# Patient Record
Sex: Male | Born: 1955 | Race: White | Hispanic: No | Smoking: Former smoker
Health system: Southern US, Community
[De-identification: ages and names within clinical notes are randomized; demographics above are authoritative.]

## PROBLEM LIST (undated history)

## (undated) DIAGNOSIS — K7460 Unspecified cirrhosis of liver: Secondary | ICD-10-CM

## (undated) DIAGNOSIS — K2270 Barrett's esophagus without dysplasia: Secondary | ICD-10-CM

## (undated) DIAGNOSIS — I2510 Atherosclerotic heart disease of native coronary artery without angina pectoris: Secondary | ICD-10-CM

## (undated) DIAGNOSIS — G4700 Insomnia, unspecified: Secondary | ICD-10-CM

## (undated) DIAGNOSIS — C220 Liver cell carcinoma: Secondary | ICD-10-CM

## (undated) DIAGNOSIS — C801 Malignant (primary) neoplasm, unspecified: Secondary | ICD-10-CM

## (undated) DIAGNOSIS — I519 Heart disease, unspecified: Secondary | ICD-10-CM

## (undated) HISTORY — DX: Hereditary hemochromatosis: E83.110

## (undated) HISTORY — PX: CORONARY ARTERY BYPASS GRAFT: SHX141

## (undated) HISTORY — DX: Atherosclerotic heart disease of native coronary artery without angina pectoris: I25.10

## (undated) HISTORY — DX: Insomnia, unspecified: G47.00

## (undated) HISTORY — DX: Liver cell carcinoma: C22.0

## (undated) HISTORY — DX: Heart disease, unspecified: I51.9

## (undated) HISTORY — PX: VEIN BYPASS SURGERY: SHX833

---

## 2010-06-12 DIAGNOSIS — E785 Hyperlipidemia, unspecified: Secondary | ICD-10-CM | POA: Insufficient documentation

## 2010-06-12 DIAGNOSIS — I739 Peripheral vascular disease, unspecified: Secondary | ICD-10-CM | POA: Insufficient documentation

## 2010-06-12 DIAGNOSIS — I10 Essential (primary) hypertension: Secondary | ICD-10-CM | POA: Insufficient documentation

## 2011-06-06 DIAGNOSIS — F17210 Nicotine dependence, cigarettes, uncomplicated: Secondary | ICD-10-CM | POA: Insufficient documentation

## 2019-08-31 ENCOUNTER — Encounter: Payer: Self-pay | Admitting: Family Medicine

## 2019-08-31 ENCOUNTER — Other Ambulatory Visit: Payer: Self-pay

## 2019-08-31 ENCOUNTER — Ambulatory Visit: Payer: 59 | Admitting: Family Medicine

## 2019-08-31 VITALS — BP 160/75 | HR 80 | Temp 98.7°F | Ht 70.1 in | Wt 177.0 lb

## 2019-08-31 DIAGNOSIS — L723 Sebaceous cyst: Secondary | ICD-10-CM | POA: Diagnosis not present

## 2019-08-31 DIAGNOSIS — I2583 Coronary atherosclerosis due to lipid rich plaque: Secondary | ICD-10-CM

## 2019-08-31 DIAGNOSIS — I2510 Atherosclerotic heart disease of native coronary artery without angina pectoris: Secondary | ICD-10-CM | POA: Diagnosis not present

## 2019-08-31 DIAGNOSIS — L700 Acne vulgaris: Secondary | ICD-10-CM

## 2019-08-31 DIAGNOSIS — Z7689 Persons encountering health services in other specified circumstances: Secondary | ICD-10-CM

## 2019-08-31 DIAGNOSIS — F17210 Nicotine dependence, cigarettes, uncomplicated: Secondary | ICD-10-CM

## 2019-08-31 NOTE — Progress Notes (Signed)
BP (!) 160/75   Pulse 80   Temp 98.7 F (37.1 C) (Oral)   Ht 5' 10.1" (1.781 m)   Wt 177 lb (80.3 kg)   SpO2 98%   BMI 25.32 kg/m    Subjective:    Patient ID: Maxwell Edwards, male    DOB: 1956-02-17, 64 y.o.   MRN: [id number]  HPI: Bethel Gaglio is a 64 y.o. male  Chief Complaint  Patient presents with  . Establish Care    pt would like to discuss about a knot hi's had on his back for about 2 weeks. itchy   Here today to establish care. Accompanied by daughter who provides most of the history.   History of triple bypass in 2008. Was followed for several years by Cardiology but has not been seen in many years now. States he's feeling well and has not had issues with CP, SOB, palpitations, etc in years. Not on any medications and does not wish to be. Has been getting basic screening labs some years per daughter's request, has not had any in over a year. Does not desire any sort of labs or medications for interventions at this point. Smokes cigarettes, no interest in quitting.   Has a spot on his back that itches and bothers him because he can't reach it. States it seems to come and go. Does not drain or bleeding.   Relevant past medical, surgical, family and social history reviewed and updated as indicated. Interim medical history since our last visit reviewed. Allergies and medications reviewed and updated.  Review of Systems  Per HPI unless specifically indicated above     Objective:    BP (!) 160/75   Pulse 80   Temp 98.7 F (37.1 C) (Oral)   Ht 5' 10.1" (1.781 m)   Wt 177 lb (80.3 kg)   SpO2 98%   BMI 25.32 kg/m   Wt Readings from Last 3 Encounters:  08/31/19 177 lb (80.3 kg)    Physical Exam Vitals and nursing note reviewed.  Constitutional:      Appearance: Normal appearance.  HENT:     Head: Atraumatic.  Eyes:     Extraocular Movements: Extraocular movements intact.     Conjunctiva/sclera: Conjunctivae normal.  Cardiovascular:     Rate and  Rhythm: Normal rate and regular rhythm.  Pulmonary:     Effort: Pulmonary effort is normal.     Breath sounds: Normal breath sounds.  Musculoskeletal:        General: Normal range of motion.     Cervical back: Normal range of motion and neck supple.  Skin:    General: Skin is warm and dry.     Comments: Large blackhead present left low back, and fluctuant sebaceous cyst present above and laterally to it  Neurological:     General: No focal deficit present.     Mental Status: He is oriented to person, place, and time.  Psychiatric:        Mood and Affect: Mood normal.        Thought Content: Thought content normal.        Judgment: Judgment normal.     No results found for this or any previous visit.    Assessment & Plan:   Problem List Items Addressed This Visit      Cardiovascular and Mediastinum   CAD (coronary artery disease) - Primary    S/p triple bypass in 2008. Declines labs/medications/interventions. Not interested in smoking cessation  Other   Cigarette smoker    Not interested in quitting. Counseling provided       Other Visit Diagnoses    Encounter to establish care       Sebaceous cyst       Able to express thick white materials with pressure and gauze. Home care reviewed   [facility]       Debris extracted, area cleaned well. Home care reviewed       Follow up plan: Return for CPE.

## 2019-09-04 DIAGNOSIS — F17210 Nicotine dependence, cigarettes, uncomplicated: Secondary | ICD-10-CM | POA: Insufficient documentation

## 2019-09-04 DIAGNOSIS — I2510 Atherosclerotic heart disease of native coronary artery without angina pectoris: Secondary | ICD-10-CM | POA: Insufficient documentation

## 2019-09-04 NOTE — Assessment & Plan Note (Signed)
S/p triple bypass in 2008. Declines labs/medications/interventions. Not interested in smoking cessation

## 2019-09-04 NOTE — Assessment & Plan Note (Signed)
Not interested in quitting. Counseling provided

## 2020-04-05 ENCOUNTER — Encounter: Payer: Self-pay | Admitting: Nurse Practitioner

## 2020-04-05 ENCOUNTER — Telehealth: Payer: Self-pay

## 2020-04-05 NOTE — Telephone Encounter (Signed)
Copied from [place] [id number]. Topic: Quick Communication - See Telephone Encounter >> Apr 05, 2020 12:33 PM Loma Boston wrote: CRM for notification. See Telephone encounter for: 04/05/20. Pt can not taste food since Christmas, lots of fatique, does not eat, was around people  Christmas with COVID but never tested.  His main issue now is that he does not eat  hardly anything b/c everything taste terrible.  Only seen Merrie Roof as new pt. Unsure how to sch [phone] call back to wife Stellan Vick   Charlette Caffey call or would have touched base with office   Pt has been scheduled for 04/06/2020 with Marnee Guarneri NP for in office -[place]

## 2020-04-06 ENCOUNTER — Encounter: Payer: Self-pay | Admitting: Nurse Practitioner

## 2020-04-06 ENCOUNTER — Ambulatory Visit: Payer: 59 | Admitting: Nurse Practitioner

## 2020-04-06 ENCOUNTER — Other Ambulatory Visit: Payer: Self-pay

## 2020-04-06 ENCOUNTER — Telehealth: Payer: Self-pay | Admitting: Nurse Practitioner

## 2020-04-06 ENCOUNTER — Ambulatory Visit
Admission: RE | Admit: 2020-04-06 | Discharge: 2020-04-06 | Disposition: A | Discharge status: Home / Self Care | Payer: 59 | Source: Ambulatory Visit | Attending: Nurse Practitioner | Admitting: Nurse Practitioner

## 2020-04-06 VITALS — BP 137/89 | HR 93 | Temp 98.5°F | Ht 70.47 in | Wt 161.0 lb

## 2020-04-06 DIAGNOSIS — J432 Centrilobular emphysema: Secondary | ICD-10-CM | POA: Insufficient documentation

## 2020-04-06 DIAGNOSIS — R432 Parageusia: Secondary | ICD-10-CM

## 2020-04-06 DIAGNOSIS — R634 Abnormal weight loss: Secondary | ICD-10-CM | POA: Diagnosis present

## 2020-04-06 DIAGNOSIS — K7460 Unspecified cirrhosis of liver: Secondary | ICD-10-CM | POA: Insufficient documentation

## 2020-04-06 DIAGNOSIS — I10 Essential (primary) hypertension: Secondary | ICD-10-CM | POA: Diagnosis not present

## 2020-04-06 DIAGNOSIS — F17210 Nicotine dependence, cigarettes, uncomplicated: Secondary | ICD-10-CM

## 2020-04-06 DIAGNOSIS — Z114 Encounter for screening for human immunodeficiency virus [HIV]: Secondary | ICD-10-CM

## 2020-04-06 DIAGNOSIS — I700 Atherosclerosis of aorta: Secondary | ICD-10-CM | POA: Insufficient documentation

## 2020-04-06 DIAGNOSIS — K7290 Hepatic failure, unspecified without coma: Secondary | ICD-10-CM | POA: Insufficient documentation

## 2020-04-06 DIAGNOSIS — E782 Mixed hyperlipidemia: Secondary | ICD-10-CM | POA: Diagnosis not present

## 2020-04-06 DIAGNOSIS — Z1159 Encounter for screening for other viral diseases: Secondary | ICD-10-CM

## 2020-04-06 DIAGNOSIS — I252 Old myocardial infarction: Secondary | ICD-10-CM

## 2020-04-06 DIAGNOSIS — R9389 Abnormal findings on diagnostic imaging of other specified body structures: Secondary | ICD-10-CM

## 2020-04-06 NOTE — Assessment & Plan Note (Signed)
Acute since Christmas in smoker.  No history of URI at time.  Will check Covid IGG, TSH, CBC, CMP, Hep C, and HIV today.  Referral to ENT urgent as small lesion noted on exam to left tonsillar area.

## 2020-04-06 NOTE — Telephone Encounter (Signed)
Patient's wife called and patient gave permission to speak to her about what Jolene told him earlier today. I advised as noted by Marnee Guarneri, NP. She verbalized understanding.

## 2020-04-06 NOTE — Telephone Encounter (Signed)
[phone] Patient wife is calling stating that the patient paid a co- pay for the OV today. His insurance card states that he pays $0 for OV. Please advise

## 2020-04-06 NOTE — Patient Instructions (Signed)
Tobacco Use Disorder Tobacco use disorder (TUD) occurs when a person craves, seeks, and uses tobacco, regardless of the consequences. This disorder can cause problems with mental and physical health. It can affect your ability to have healthy relationships, and it can keep you from meeting your responsibilities at work, home, or school. Tobacco may be:  Smoked as a cigarette or cigar.  Inhaled using e-cigarettes.  Smoked in a pipe or hookah.  Chewed as smokeless tobacco.  Inhaled into the nostrils as snuff. Tobacco products contain a dangerous chemical called nicotine, which is very addictive. Nicotine triggers hormones that make the body feel stimulated and works on areas of the brain that make you feel good. These effects can make it hard for people to quit nicotine. Tobacco contains many other unsafe chemicals that can damage almost every organ in the body. Smoking tobacco also puts others in danger due to fire risk and possible health problems caused by breathing in secondhand smoke. What are the signs or symptoms? Symptoms of TUD may include:  Being unable to slow down or stop your tobacco use.  Spending an abnormal amount of time getting or using tobacco.  Craving tobacco. Cravings may last for up to 6 months after quitting.  Tobacco use that: ? Interferes with your work, school, or home life. ? Interferes with your personal and social relationships. ? Makes you give up activities that you once enjoyed or found important.  Using tobacco even though you know that it is: ? Dangerous or bad for your health or someone else's health. ? Causing problems in your life.  Needing more and more of the substance to get the same effect (developing tolerance).  Experiencing unpleasant symptoms if you do not use the substance (withdrawal). Withdrawal symptoms may include: ? Depressed, anxious, or irritable mood. ? Difficulty concentrating. ? Increased appetite. ? Restlessness or trouble  sleeping.  Using the substance to avoid withdrawal. How is this diagnosed? This condition may be diagnosed based on:  Your current and past tobacco use. Your health care provider may ask questions about how your tobacco use affects your life.  A physical exam. You may be diagnosed with TUD if you have at least two symptoms within a 12-month period. How is this treated? This condition is treated by stopping tobacco use. Many people are unable to quit on their own and need help. Treatment may include:  Nicotine replacement therapy (NRT). NRT provides nicotine without the other harmful chemicals in tobacco. NRT gradually lowers the dosage of nicotine in the body and reduces withdrawal symptoms. NRT is available as: ? Over-the-counter gums, lozenges, and skin patches. ? Prescription mouth inhalers and nasal sprays.  Medicine that acts on the brain to reduce cravings and withdrawal symptoms.  A type of talk therapy that examines your triggers for tobacco use, how to avoid them, and how to cope with cravings (behavioral therapy).  Hypnosis. This may help with withdrawal symptoms.  Joining a support group for others coping with TUD. The best treatment for TUD is usually a combination of medicine, talk therapy, and support groups. Recovery can be a long process. Many people start using tobacco again after stopping (relapse). If you relapse, it does not mean that treatment will not work. Follow these instructions at home: Lifestyle  Do not use any products that contain nicotine or tobacco, such as cigarettes and e-cigarettes.  Avoid things that trigger tobacco use as much as you can. Triggers include people and situations that usually cause you to   use tobacco.  Avoid drinks that contain caffeine, including coffee. These may worsen some withdrawal symptoms.  Find ways to manage stress. Wanting to smoke may cause stress, and stress can make you want to smoke. Relaxation techniques such as deep  breathing, meditation, and yoga may help.  Attend support groups as needed. These groups are an important part of long-term recovery for many people. General instructions  Take over-the-counter and prescription medicines only as told by your health care provider.  Check with your health care provider before taking any new prescription or over-the-counter medicines.  Decide on a friend, family member, or smoking quit-line (such as 1-800-QUIT-NOW in the [place] that you can call or text when you feel the urge to smoke or when you need help coping with cravings.  Keep all follow-up visits as told by your health care provider and therapist. This is important.   Contact a health care provider if:  You are not able to take your medicines as prescribed.  Your symptoms get worse, even with treatment. Summary  Tobacco use disorder (TUD) occurs when a person craves, seeks, and uses tobacco regardless of the consequences.  This condition may be diagnosed based on your current and past tobacco use and a physical exam.  Many people are unable to quit on their own and need help. Recovery can be a long process.  The most effective treatment for TUD is usually a combination of medicine, talk therapy, and support groups. This information is not intended to replace advice given to you by your health care provider. Make sure you discuss any questions you have with your health care provider. Document Revised: 02/11/2017 Document Reviewed: 02/11/2017 Elsevier Patient Education  2021 Elsevier Inc.  

## 2020-04-06 NOTE — Assessment & Plan Note (Signed)
I have recommended complete cessation of tobacco use. I have discussed various options available for assistance with tobacco cessation including over the counter methods (Nicotine gum, patch and lozenges). We also discussed prescription options (Chantix, Nicotine Inhaler / Nasal Spray). The patient is not interested in pursuing any prescription tobacco cessation options at this time.  

## 2020-04-06 NOTE — Assessment & Plan Note (Signed)
16 pounds in smoker with with recent taste loss, suspect this weight loss is more recently as has not had to buy new clothes until past 2-3 weeks.  Small lesion noted left tonsillar area, will place urgent referral to ENT to further exam as patient is smoker.  STAT CT lung today, smoker and concern for CA.  Labs obtained.  Will bring back in one week for follow-up and further evaluation.

## 2020-04-06 NOTE — Telephone Encounter (Signed)
Spoke to patient via telephone and reviewed CT findings with him that are suspicious for hepatocellular cancer and possible lung mets + cirrhosis.  He does report history of drinking alcohol years ago, but never heavy drinker he reports.  All questions answered.  He is scheduled to see ENT on Monday for throat assessment due to possible lesion needed posterior left side today.  Discussed with him that urgent referrals would be placed to GI and oncology for current findings.  Has never had colonoscopy.  He agrees with this plan of care.

## 2020-04-06 NOTE — Progress Notes (Signed)
Refer to telephone note on 04/06/20 -- results provided via phone.

## 2020-04-06 NOTE — Assessment & Plan Note (Addendum)
s/p 3 x CABG (in 2010).  Currently no medications, was followed by [facility] cardiology with no visit since 2015.  Suspect will need to restart Lipitor and ASA daily.  Labs today. Recommend complete cessation of smoking.

## 2020-04-06 NOTE — Progress Notes (Addendum)
BP 137/89   Pulse 93   Temp 98.5 F (36.9 C) (Oral)   Ht 5' 10.47" (1.79 m)   Wt 161 lb (73 kg)   SpO2 100%   BMI 22.79 kg/m    Subjective:    Patient ID: Maxwell Edwards, male    DOB: 07-20-1955, 65 y.o.   MRN: [id number]  HPI: Maxwell Edwards is a 65 y.o. male  Chief Complaint  Patient presents with  . not eat    When he eats and drinks he gets a bad taste in his mouth with the exception of water since Christmas    DECREASED APPETITE: Presents today for taste changes since around Christmas -- reports when eating and drinking it tastes "yucky".  No pain to mouth, denies sores in mouth.  Is a smoker, has smoked since age 69 or 55.  Was smoking 1 PPD and now a pack last 3 days -- started cutting back when taste changes.  At the time of taste changes he denies any URI symptoms or loss of smell.  Only taste changes.  Since this he reports less appetite -- eating about 2 meals a day and eating about 1/4 meal.  He does notice weight loss, has had to buy new clothes over past 2-3 weeks.  Currently no medications, reports coming off these 7 years ago.  Has history of MI with open heart surgery in 2000.  Has never had a colonoscopy.  No family hx of colon cancer or other cancers reported.  Denies sore throat.  Denies alcohol or drug use. Duration:weeks Frequency: constant Alleviating factors:  Aggravating factors: Status: fluctuating Treatments attempted: none Fever: no Nausea: no Vomiting: no Weight loss: yes Decreased appetite: yes Diarrhea: no Constipation: no Blood in stool: no Heartburn: no Jaundice: no Rash: no  Relevant past medical, surgical, family and social history reviewed and updated as indicated. Interim medical history since our last visit reviewed. Allergies and medications reviewed and updated.  Review of Systems  Constitutional: Positive for appetite change and unexpected weight change. Negative for activity change, diaphoresis, fatigue and fever.   Respiratory: Negative for cough, chest tightness, shortness of breath and wheezing.   Cardiovascular: Negative for chest pain, palpitations and leg swelling.  Gastrointestinal: Negative.   Endocrine: Negative for cold intolerance and heat intolerance.  Neurological: Negative.   Psychiatric/Behavioral: Negative.     Per HPI unless specifically indicated above     Objective:    BP 137/89   Pulse 93   Temp 98.5 F (36.9 C) (Oral)   Ht 5' 10.47" (1.79 m)   Wt 161 lb (73 kg)   SpO2 100%   BMI 22.79 kg/m   Wt Readings from Last 3 Encounters:  04/06/20 161 lb (73 kg)  08/31/19 177 lb (80.3 kg)    Physical Exam Vitals and nursing note reviewed.  Constitutional:      General: He is awake. He is not in acute distress.    Appearance: He is well-developed and underweight. He is not ill-appearing or toxic-appearing.  HENT:     Head: Normocephalic and atraumatic.     Right Ear: Hearing, ear canal and external ear normal. No drainage.     Left Ear: Hearing, ear canal and external ear normal. No drainage.     Nose: Nose normal.     Mouth/Throat:     Mouth: Mucous membranes are moist.     Pharynx: Uvula midline. Posterior oropharyngeal erythema present. No pharyngeal swelling or oropharyngeal exudate.  Comments: Top dentures in place, bottom edentulous.  Mucus membranes pink. Eyes:     General: Lids are normal.        Right eye: No discharge.        Left eye: No discharge.     Conjunctiva/sclera: Conjunctivae normal.     Pupils: Pupils are equal, round, and reactive to light.  Neck:     Thyroid: Thyromegaly (mild noted) present.     Vascular: No carotid bruit.     Trachea: Trachea normal.  Cardiovascular:     Rate and Rhythm: Normal rate and regular rhythm.     Heart sounds: Normal heart sounds, S1 normal and S2 normal. No murmur heard. No gallop.   Pulmonary:     Effort: Pulmonary effort is normal. No accessory muscle usage or respiratory distress.     Breath sounds:  Normal breath sounds.  Abdominal:     General: Bowel sounds are normal.     Palpations: Abdomen is soft. There is no hepatomegaly or splenomegaly.  Musculoskeletal:        General: Normal range of motion.     Cervical back: Normal range of motion and neck supple.     Right lower leg: No edema.     Left lower leg: No edema.  Lymphadenopathy:     Head:     Right side of head: No submental, submandibular, tonsillar, preauricular or posterior auricular adenopathy.     Left side of head: No submental, submandibular, tonsillar, preauricular or posterior auricular adenopathy.     Cervical: No cervical adenopathy.  Skin:    General: Skin is warm and dry.     Capillary Refill: Capillary refill takes less than 2 seconds.  Neurological:     Mental Status: He is alert and oriented to person, place, and time.     Deep Tendon Reflexes: Reflexes are normal and symmetric.     Reflex Scores:      Brachioradialis reflexes are 2+ on the right side and 2+ on the left side.      Patellar reflexes are 2+ on the right side and 2+ on the left side. Psychiatric:        Attention and Perception: Attention normal.        Mood and Affect: Mood normal.        Speech: Speech normal.        Behavior: Behavior normal. Behavior is cooperative.        Thought Content: Thought content normal.    No results found for this or any previous visit.    Assessment & Plan:   Problem List Items Addressed This Visit      Cardiovascular and Mediastinum   Hypertension, benign    Chronic, ongoing.  BP mildly elevated today.  In past was on Metoprolol, post MI in 2000.  At this time no medications.  Obtain labs today.  Recommend he monitor BP at least a few mornings a week at home and document.  DASH diet at home.  Suspect will need to restart low dose medication, would consider Losartan and avoid ACE as suspect some underlying COPD.  Return in one week.        Other   Hyperlipidemia    Noted on past [facility] records when he  took Lipitor and Fish Oil, no medications at this time.  Discussed with him today and suspect we will need to restart, especially in presence of past CABG and MI.  Check Lipid panel today and initiate  medication as needed.  Return in 1 week.      Nicotine dependence, cigarettes, uncomplicated    I have recommended complete cessation of tobacco use. I have discussed various options available for assistance with tobacco cessation including over the counter methods (Nicotine gum, patch and lozenges). We also discussed prescription options (Chantix, Nicotine Inhaler / Nasal Spray). The patient is not interested in pursuing any prescription tobacco cessation options at this time.       History of MI (myocardial infarction)    s/p 3 x CABG (in 2010).  Currently no medications, was followed by [facility] cardiology with no visit since 2015.  Suspect will need to restart Lipitor and ASA daily.  Labs today. Recommend complete cessation of smoking.       Abnormal weight loss - Primary    16 pounds in smoker with with recent taste loss, suspect this weight loss is more recently as has not had to buy new clothes until past 2-3 weeks.  Small lesion noted left tonsillar area, will place urgent referral to ENT to further exam as patient is smoker.  STAT CT lung today, smoker and concern for CA.  Labs obtained.  Will bring back in one week for follow-up and further evaluation.      Relevant Orders   CBC with Differential/Platelet   Comprehensive metabolic panel   TSH   CT Chest Wo Contrast   SAR CoV2 Serology (COVID 19)AB(IGG)IA   Ambulatory referral to ENT   Loss of taste    Acute since Christmas in smoker.  No history of URI at time.  Will check Covid IGG, TSH, CBC, CMP, Hep C, and HIV today.  Referral to ENT urgent as small lesion noted on exam to left tonsillar area.      Relevant Orders   SAR CoV2 Serology (COVID 19)AB(IGG)IA   Ambulatory referral to ENT    Other Visit Diagnoses    Need for hepatitis  C screening test       Hep C on labs today   Relevant Orders   Hepatitis C antibody   Encounter for screening for HIV       HIV screening on labs today   Relevant Orders   HIV Antibody (routine testing w rflx)      [place] [id number] valid 1/28/2 through 05/05/20  Follow up plan: Return in about 1 week (around 04/13/2020) for Weight loss and taste loss.

## 2020-04-06 NOTE — Assessment & Plan Note (Signed)
Noted on past [facility] records when he took Lipitor and Fish Oil, no medications at this time.  Discussed with him today and suspect we will need to restart, especially in presence of past CABG and MI.  Check Lipid panel today and initiate medication as needed.  Return in 1 week.

## 2020-04-06 NOTE — Assessment & Plan Note (Signed)
Chronic, ongoing.  BP mildly elevated today.  In past was on Metoprolol, post MI in 2000.  At this time no medications.  Obtain labs today.  Recommend he monitor BP at least a few mornings a week at home and document.  DASH diet at home.  Suspect will need to restart low dose medication, would consider Losartan and avoid ACE as suspect some underlying COPD.  Return in one week.

## 2020-04-07 LAB — COMPREHENSIVE METABOLIC PANEL
ALT: 47 IU/L — ABNORMAL HIGH (ref 0–44)
AST: 62 IU/L — ABNORMAL HIGH (ref 0–40)
Albumin/Globulin Ratio: 1.1 — ABNORMAL LOW (ref 1.2–2.2)
Albumin: 4.3 g/dL (ref 3.8–4.8)
Alkaline Phosphatase: 326 IU/L — ABNORMAL HIGH (ref 44–121)
BUN/Creatinine Ratio: 17 (ref 10–24)
BUN: 15 mg/dL (ref 8–27)
Bilirubin Total: 0.6 mg/dL (ref 0.0–1.2)
CO2: 23 mmol/L (ref 20–29)
Calcium: 9.8 mg/dL (ref 8.6–10.2)
Chloride: 95 mmol/L — ABNORMAL LOW (ref 96–106)
Creatinine, Ser: 0.9 mg/dL (ref 0.76–1.27)
GFR calc Af Amer: 104 mL/min/{1.73_m2} (ref >59)
GFR calc non Af Amer: 90 mL/min/{1.73_m2} (ref >59)
Globulin, Total: 3.9 g/dL (ref 1.5–4.5)
Glucose: 81 mg/dL (ref 65–99)
Potassium: 4.8 mmol/L (ref 3.5–5.2)
Sodium: 135 mmol/L (ref 134–144)
Total Protein: 8.2 g/dL (ref 6.0–8.5)

## 2020-04-07 LAB — CBC WITH DIFFERENTIAL/PLATELET
Basophils Absolute: 0.1 10*3/uL (ref 0.0–0.2)
Basos: 1 %
EOS (ABSOLUTE): 0.1 10*3/uL (ref 0.0–0.4)
Eos: 1 %
Hematocrit: 53.8 % — ABNORMAL HIGH (ref 37.5–51.0)
Hemoglobin: 18.3 g/dL — ABNORMAL HIGH (ref 13.0–17.7)
Immature Grans (Abs): 0 10*3/uL (ref 0.0–0.1)
Immature Granulocytes: 0 %
Lymphocytes Absolute: 2.1 10*3/uL (ref 0.7–3.1)
Lymphs: 20 %
MCH: 32 pg (ref 26.6–33.0)
MCHC: 34 g/dL (ref 31.5–35.7)
MCV: 94 fL (ref 79–97)
Monocytes Absolute: 0.7 10*3/uL (ref 0.1–0.9)
Monocytes: 7 %
Neutrophils Absolute: 7.1 10*3/uL — ABNORMAL HIGH (ref 1.4–7.0)
Neutrophils: 71 %
Platelets: 247 10*3/uL (ref 150–450)
RBC: 5.72 x10E6/uL (ref 4.14–5.80)
RDW: 12.2 % (ref 11.6–15.4)
WBC: 10 10*3/uL (ref 3.4–10.8)

## 2020-04-07 LAB — HIV ANTIBODY (ROUTINE TESTING W REFLEX): HIV Screen 4th Generation wRfx: NONREACTIVE

## 2020-04-07 LAB — TSH: TSH: 1.38 u[IU]/mL (ref 0.450–4.500)

## 2020-04-07 LAB — HEPATITIS C ANTIBODY: Hep C Virus Ab: <0.1 s/co ratio (ref 0.0–0.9)

## 2020-04-07 LAB — SAR COV2 SEROLOGY (COVID19)AB(IGG),IA
SARS-CoV-2 Semi-Quant IgG Ab: 301 AU/mL (ref <13.0)
SARS-CoV-2 Spike Ab Interp: POSITIVE

## 2020-04-07 NOTE — Progress Notes (Signed)
Contacted via [place], not sure he checks this.  Please call and see if he received below message: Maxwell Edwards, your labs have returned.  Your Covid antibody testing came back positive.  This means at some time you did have Covid and this may be underlying reason your taste was effected weeks ago, as this is a symptoms of Covid we see at times and takes time to return.  Liver function tests show some mild elevation (ALT and AST) as suspected might see and alkaline phosphatase elevated.  HIV and Hep C negative and thyroid testing normal.  Any questions? Keep being awesome!!  Thank you for allowing me to participate in your care. Kindest regards, Guida Asman

## 2020-04-09 NOTE — Telephone Encounter (Signed)
Pt called back advised to him that it would be between him and his insurance per Alwyn Ren may be reimbursed after insurance pays. Pt verbalized understanding

## 2020-04-09 NOTE — Telephone Encounter (Signed)
Called pt, wife is not on dpr, no answer, no vm.

## 2020-04-10 ENCOUNTER — Inpatient Hospital Stay: Payer: 59

## 2020-04-10 ENCOUNTER — Telehealth: Payer: Self-pay

## 2020-04-10 ENCOUNTER — Inpatient Hospital Stay: Payer: 59 | Attending: Oncology | Admitting: Oncology

## 2020-04-10 ENCOUNTER — Encounter: Payer: Self-pay | Admitting: Oncology

## 2020-04-10 ENCOUNTER — Other Ambulatory Visit: Payer: Self-pay | Admitting: *Deleted

## 2020-04-10 VITALS — BP 128/74 | HR 79 | Temp 99.5°F | Resp 16 | Ht 70.47 in | Wt 160.0 lb

## 2020-04-10 DIAGNOSIS — R160 Hepatomegaly, not elsewhere classified: Secondary | ICD-10-CM | POA: Diagnosis not present

## 2020-04-10 DIAGNOSIS — I252 Old myocardial infarction: Secondary | ICD-10-CM | POA: Diagnosis not present

## 2020-04-10 DIAGNOSIS — R911 Solitary pulmonary nodule: Secondary | ICD-10-CM | POA: Diagnosis not present

## 2020-04-10 DIAGNOSIS — D751 Secondary polycythemia: Secondary | ICD-10-CM

## 2020-04-10 DIAGNOSIS — E785 Hyperlipidemia, unspecified: Secondary | ICD-10-CM | POA: Insufficient documentation

## 2020-04-10 DIAGNOSIS — K7460 Unspecified cirrhosis of liver: Secondary | ICD-10-CM | POA: Insufficient documentation

## 2020-04-10 DIAGNOSIS — F17210 Nicotine dependence, cigarettes, uncomplicated: Secondary | ICD-10-CM | POA: Insufficient documentation

## 2020-04-10 DIAGNOSIS — I10 Essential (primary) hypertension: Secondary | ICD-10-CM | POA: Diagnosis not present

## 2020-04-10 DIAGNOSIS — R634 Abnormal weight loss: Secondary | ICD-10-CM | POA: Diagnosis present

## 2020-04-10 DIAGNOSIS — I81 Portal vein thrombosis: Secondary | ICD-10-CM | POA: Insufficient documentation

## 2020-04-10 DIAGNOSIS — Z79899 Other long term (current) drug therapy: Secondary | ICD-10-CM | POA: Diagnosis not present

## 2020-04-10 DIAGNOSIS — I2510 Atherosclerotic heart disease of native coronary artery without angina pectoris: Secondary | ICD-10-CM | POA: Diagnosis not present

## 2020-04-10 DIAGNOSIS — Z951 Presence of aortocoronary bypass graft: Secondary | ICD-10-CM | POA: Insufficient documentation

## 2020-04-10 DIAGNOSIS — Z808 Family history of malignant neoplasm of other organs or systems: Secondary | ICD-10-CM | POA: Insufficient documentation

## 2020-04-10 DIAGNOSIS — R5383 Other fatigue: Secondary | ICD-10-CM | POA: Insufficient documentation

## 2020-04-10 DIAGNOSIS — C220 Liver cell carcinoma: Secondary | ICD-10-CM | POA: Insufficient documentation

## 2020-04-10 LAB — COMPREHENSIVE METABOLIC PANEL
ALT: 61 U/L — ABNORMAL HIGH (ref 0–44)
AST: 70 U/L — ABNORMAL HIGH (ref 15–41)
Albumin: 3.8 g/dL (ref 3.5–5.0)
Alkaline Phosphatase: 272 U/L — ABNORMAL HIGH (ref 38–126)
Anion gap: 11 (ref 5–15)
BUN: 15 mg/dL (ref 8–23)
CO2: 27 mmol/L (ref 22–32)
Calcium: 9.6 mg/dL (ref 8.9–10.3)
Chloride: 97 mmol/L — ABNORMAL LOW (ref 98–111)
Creatinine, Ser: 0.89 mg/dL (ref 0.61–1.24)
GFR, Estimated: >60 mL/min (ref >60)
Glucose, Bld: 92 mg/dL (ref 70–99)
Potassium: 4.2 mmol/L (ref 3.5–5.1)
Sodium: 135 mmol/L (ref 135–145)
Total Bilirubin: 0.9 mg/dL (ref 0.3–1.2)
Total Protein: 9 g/dL — ABNORMAL HIGH (ref 6.5–8.1)

## 2020-04-10 LAB — HEPATITIS B CORE ANTIBODY, TOTAL: Hep B Core Total Ab: NONREACTIVE

## 2020-04-10 LAB — HEPATITIS B SURFACE ANTIGEN: Hepatitis B Surface Ag: NONREACTIVE

## 2020-04-10 LAB — APTT: aPTT: 31 seconds (ref 24–36)

## 2020-04-10 LAB — PROTIME-INR
INR: 1.1 (ref 0.8–1.2)
Prothrombin Time: 13.4 seconds (ref 11.4–15.2)

## 2020-04-10 LAB — GAMMA GT: GGT: 214 U/L — ABNORMAL HIGH (ref 7–50)

## 2020-04-10 NOTE — Progress Notes (Signed)
Hematology/Oncology Consult note [facility] [phone] [phone]  Patient Care Team: Venita Lick, NP as PCP - General (Nurse Practitioner) Clent Jacks, RN as Oncology Nurse Navigator   Name of the patient: Maxwell Edwards  [id number]  07-04-1955    Reason for referral-liver mass   Referring provider: Marnee Guarneri, NP  Date of visit: 04/10/20   History of presenting illness-patient is a 65 year old male with a past medical history significant for hypertension hyperlipidemia, 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  Reports that he has lost about 20 pounds over the last 2 to 3 months. He is a chronic smoker which prompted a CT chest.  CT chest without contrast on 04/06/2020 showed no evidence of mediastinal or hilar adenopathy.  He was found to have 4 subcentimeter lung nodules 3 in the right lobe and one in the left upper lobe which were borderline for metastatic disease.  Cirrhotic liver morphology with 8.3 x 7 cm ill-defined hypoattenuating lesion in the left liver with a 3.3 cm lesion in the dome of the liver near the junction of the right and left hepatic lobes.  Labs showed elevated AST and ALT of 62 and 47 respectively with a normal albumin and normal total bilirubin.  CMP showed elevated H&H of 18.3/53.8.  Hepatitis C and HIV testing was negative.  He has been referred for further recommendations.  Patient is here with his ex-wife.  Other than fatigue and weight loss he denies other complaints at this time.  Reports that he drank alcohol over the weekend sometimes binge drinking for about 20 years but quit drinking more than 30 years ago.  ECOG PS- 1  Pain scale- 0   Review of systems- Review of Systems  Constitutional: Positive for malaise/fatigue and weight loss. Negative for chills and fever.  HENT: Negative for congestion, ear discharge and nosebleeds.   Eyes: Negative for  blurred vision.  Respiratory: Negative for cough, hemoptysis, sputum production, shortness of breath and wheezing.   Cardiovascular: Negative for chest pain, palpitations, orthopnea and claudication.  Gastrointestinal: Negative for abdominal pain, blood in stool, constipation, diarrhea, heartburn, melena, nausea and vomiting.  Genitourinary: Negative for dysuria, flank pain, frequency, hematuria and urgency.  Musculoskeletal: Negative for back pain, joint pain and myalgias.  Skin: Negative for rash.  Neurological: Negative for dizziness, tingling, focal weakness, seizures, weakness and headaches.  Endo/Heme/Allergies: Does not bruise/bleed easily.  Psychiatric/Behavioral: Negative for depression and suicidal ideas. The patient does not have insomnia.     No Known Allergies  Patient Active Problem List   Diagnosis Date Noted  . History of MI (myocardial infarction) 04/06/2020  . Abnormal weight loss 04/06/2020  . Loss of taste 04/06/2020  . Centrilobular emphysema ([place]) 04/06/2020  . Aortic atherosclerosis ([place]) 04/06/2020  . Cirrhosis of liver ([place]) 04/06/2020  . CAD (coronary artery disease) 09/04/2019  . Nicotine dependence, cigarettes, uncomplicated 84/13/2440  . Hyperlipidemia 06/12/2010  . Hypertension, benign 06/12/2010     Past Medical History:  Diagnosis Date  . CAD (coronary artery disease)   . Heart disease      Past Surgical History:  Procedure Laterality Date  . CORONARY ARTERY BYPASS GRAFT     at 65 years old  . VEIN BYPASS SURGERY      Social History   Socioeconomic History  . Marital status: Unknown    Spouse name: Not on file  . Number of children: Not on file  .  Years of education: Not on file  . Highest education level: Not on file  Occupational History  . Not on file  Tobacco Use  . Smoking status: Current Every Day Smoker    Packs/day: 1.00    Types: Cigarettes  . Smokeless tobacco: Never Used  Vaping Use  . Vaping Use: Never used   Substance and Sexual Activity  . Alcohol use: Not Currently  . Drug use: Not Currently  . Sexual activity: Not Currently  Other Topics Concern  . Not on file  Social History Narrative  . Not on file   Social Determinants of Health   Financial Resource Strain: Not on file  Food Insecurity: Not on file  Transportation Needs: Not on file  Physical Activity: Not on file  Stress: Not on file  Social Connections: Not on file  Intimate Partner Violence: Not on file     Family History  Problem Relation Age of Onset  . Dementia Mother   . Ulcers Father   . Heart attack Brother   . Throat cancer Brother   . Heart attack Daughter   . Diabetes Daughter   . Depression Daughter      Current Outpatient Medications:  .  fluticasone (FLONASE) 50 MCG/ACT nasal spray, Place 1 spray into both nostrils daily., Disp: , Rfl:    Physical exam: There were no vitals filed for this visit. Physical Exam Constitutional:      General: He is not in acute distress. Eyes:     Extraocular Movements: EOM normal.  Cardiovascular:     Rate and Rhythm: Normal rate and regular rhythm.     Heart sounds: Normal heart sounds.  Pulmonary:     Effort: Pulmonary effort is normal.     Breath sounds: Normal breath sounds.  Abdominal:     General: Bowel sounds are normal.     Palpations: Abdomen is soft.  Skin:    General: Skin is warm and dry.  Neurological:     Mental Status: He is alert and oriented to person, place, and time.        CMP Latest Ref Rng & Units 04/06/2020  Glucose 65 - 99 mg/dL 81  BUN 8 - 27 mg/dL 15  Creatinine 0.76 - 1.27 mg/dL 0.90  Sodium 134 - 144 mmol/L 135  Potassium 3.5 - 5.2 mmol/L 4.8  Chloride 96 - 106 mmol/L 95(L)  CO2 20 - 29 mmol/L 23  Calcium 8.6 - 10.2 mg/dL 9.8  Total Protein 6.0 - 8.5 g/dL 8.2  Total Bilirubin 0.0 - 1.2 mg/dL 0.6  Alkaline Phos 44 - 121 IU/L 326(H)  AST 0 - 40 IU/L 62(H)  ALT 0 - 44 IU/L 47(H)   CBC Latest Ref Rng & Units 04/06/2020   WBC 3.4 - 10.8 x10E3/uL 10.0  Hemoglobin 13.0 - 17.7 g/dL 18.3(H)  Hematocrit 37.5 - 51.0 % 53.8(H)  Platelets 150 - 450 x10E3/uL 247    No images are attached to the encounter.  CT Chest Wo Contrast  Result Date: 04/06/2020 CLINICAL DATA:  Unintended weight loss.  Smoking history. EXAM: CT CHEST WITHOUT CONTRAST TECHNIQUE: Multidetector CT imaging of the chest was performed following the standard protocol without IV contrast. COMPARISON:  None. FINDINGS: Cardiovascular: The heart size is normal. No substantial pericardial effusion. Coronary artery calcification is evident. Atherosclerotic calcification is noted in the wall of the thoracic aorta. Mediastinum/Nodes: No mediastinal lymphadenopathy. No evidence for gross hilar lymphadenopathy although assessment is limited by the lack of intravenous contrast on  today's study. The esophagus has normal imaging features. There is no axillary lymphadenopathy. Lungs/Pleura: Centrilobular emphsyema noted. 4 mm right middle lobe nodule identified on image 84/2 with subtle right middle lobe tree-in-bud nodularity seen on image 86/2. 4 mm medial right upper lobe nodule identified on image 19/series 2. Possible central 8 mm right middle lobe nodule on axial 81/2 and coronal 59/5. 3 mm left upper lobe nodule identified on 69/2. No overtly suspicious nodule or mass. No focal airspace consolidation. No pleural effusion. Upper Abdomen: Diffusely nodular hepatic contour is compatible with cirrhosis. 8.3 x 7.0 cm ill-defined hypoattenuating lesion is identified in the left liver. 3.3 cm lesion is noted in the dome of the liver near the junction of the right and left hepatic lobes (103/3). Upper normal lymph nodes are seen in the gastrohepatic and hepatoduodenal ligaments. Recanalization of the paraumbilical vein suggests portal venous hypertension. Musculoskeletal: No worrisome lytic or sclerotic osseous abnormality. IMPRESSION: 1. Cirrhotic liver morphology with 8.3 x 7.0  cm ill-defined hypoattenuating lesion in the left liver with a 3.3 cm lesion in the dome of the liver. Given the apparent cirrhosis, imaging features are highly concerning for hepatocellular carcinoma. MRI of the abdomen without and with contrast recommended to further evaluate. 2. Scattered bilateral pulmonary nodules measuring up to 8 mm. Close follow-up recommended as metastatic disease not excluded. 3. Recanalization of the paraumbilical vein suggests portal venous hypertension. 4. Aortic Atherosclerosis (ICD10-I70.0) and Emphysema (ICD10-J43.9). These results will be called to the ordering clinician or representative by the Radiologist Assistant, and communication documented in the PACS or [employer]. Electronically Signed   By: Misty Stanley M.D.   On: 04/06/2020 14:48    Assessment and plan- Patient is a 65 y.o. male referred for 2 large liver masses in the setting of underlying cirrhosis concerning for hepatocellular carcinoma  Liver mass: I have reviewed CT chest images independently and discussed findings with the patient and also showed him the images of CT chest.  He will need an MRI abdomen with and without contrast for further characterization.  Based on that we can decide if this is diagnostic of [place] versus need for liver biopsy.  Will check CA 19-9 and AFP today.  I will discuss his case at tumor board after MRI results are back.  Patient also has subcentimeter lung nodules and in the presence of 2 large liver masses metastatic disease is presently not ruled out.  However the spots are subcentimeter and would not be amenable to biopsy and may not be hypermetabolic on PET scan.  Cirrhosis: Patient also found to have underlying cirrhosis the etiology of which is unclear.  HIV and hepatitis C testing was negative and I will order hepatitis B testing today.  He quit drinking alcohol over 30 years ago.  He has never been overweight.  He will be seeing GI to discuss cirrhosis further.  Suspect  mildly elevated AST and ALT is secondary to underlying cirrhosis.  He has a normal albumin indicating that his synthetic functions are good.  Also his bilirubin is normal.  I will be checking PT/INR and a GGT today.  He has polycythemia as well which could be secondary to smoking.  We will add EPO levels today   Thank you for this kind referral and the opportunity to participate in the care of this patient   Visit Diagnosis 1. Abnormal weight loss   2. Liver mass   3. Lung nodule     Dr. Randa Evens, MD, MPH  [facility] [phone] 04/10/2020 4:05 PM

## 2020-04-10 NOTE — Telephone Encounter (Signed)
PA for Mri of Abd before and after contrast has been denied.

## 2020-04-11 ENCOUNTER — Encounter: Payer: Self-pay | Admitting: Gastroenterology

## 2020-04-11 ENCOUNTER — Other Ambulatory Visit: Payer: Self-pay

## 2020-04-11 ENCOUNTER — Ambulatory Visit (INDEPENDENT_AMBULATORY_CARE_PROVIDER_SITE_OTHER): Payer: 59 | Admitting: Gastroenterology

## 2020-04-11 ENCOUNTER — Telehealth: Payer: Self-pay | Admitting: *Deleted

## 2020-04-11 VITALS — BP 130/80 | HR 78 | Temp 98.3°F | Ht 70.0 in | Wt 160.0 lb

## 2020-04-11 DIAGNOSIS — R772 Abnormality of alphafetoprotein: Secondary | ICD-10-CM

## 2020-04-11 DIAGNOSIS — K769 Liver disease, unspecified: Secondary | ICD-10-CM | POA: Diagnosis not present

## 2020-04-11 DIAGNOSIS — R634 Abnormal weight loss: Secondary | ICD-10-CM

## 2020-04-11 DIAGNOSIS — R978 Other abnormal tumor markers: Secondary | ICD-10-CM

## 2020-04-11 LAB — HEPATITIS B SURFACE ANTIBODY, QUANTITATIVE: Hep B S AB Quant (Post): <3.1 m[IU]/mL — ABNORMAL LOW (ref >9.9)

## 2020-04-11 LAB — ERYTHROPOIETIN: Erythropoietin: 9.4 m[IU]/mL (ref 2.6–18.5)

## 2020-04-11 LAB — CANCER ANTIGEN 19-9: CA 19-9: 147 U/mL — ABNORMAL HIGH (ref 0–35)

## 2020-04-11 LAB — AFP TUMOR MARKER: AFP, Serum, Tumor Marker: 37822 ng/mL — ABNORMAL HIGH (ref 0.0–8.3)

## 2020-04-11 NOTE — Telephone Encounter (Signed)
Called patient and let them know that we did get the scan rescheduled for his MRI.  Everyone just wanted to make sure that he did not have active Covid and it was an antibody level and not a positivity of Covid.  Originally the only one I could get scheduled was for February 11 but I called MRI and they were able to work him in for February 4.  Scan is at 12 noon and arrive at the medical mall at the [facility] at 1130.  Patient could not eat or drink anything 4 hours prior to MRI.  Then I made him an appointment for Monday February seventh at 1030.  And agreeable to all above

## 2020-04-11 NOTE — Progress Notes (Signed)
Maxwell Darby, MD [address]  [place]  [place], [place] [address]  Main: [phone]  Fax: [phone]    Gastroenterology Consultation  Referring Provider:     Venita Lick, NP Primary Care Physician:  Venita Lick, NP Primary Gastroenterologist:  Dr. Cephas Edwards Reason for Consultation:     Unintentional weight loss, cirrhosis        HPI:   Maxwell Edwards is a 65 y.o. male referred by Dr. Venita Lick, NP  for consultation & management of unintentional weight loss and new diagnosis of cirrhosis with liver lesions.  Patient has history of coronary disease, history of chronic tobacco use, who recently recovered from Covid infection.  Patient was originally seen by his PCP on 04/06/2020 secondary to abnormal weight loss, about 20 pounds within last 2 to 3 weeks.  He underwent CT chest to evaluate for lung cancer, incidentally found to have cirrhotic liver morphology with 8.3 x 7 cm ill-defined hypoattenuating lesion in the left liver as well as 3.3 cm lesion in the dome of the liver, concerning for hepatocellular carcinoma.  He did have bilateral pulmonary nodules less than 1 cm.  As well as portal hypertension.  Patient is therefore referred to GI and oncology for further evaluation.  Patient has history of coronary disease s/p CABG.  He has not seen his PCP in several years until recent Covid infection and unintentional weight loss.  His labs revealed abnormal LFTs alkaline phosphatase 326, AST 62, ALT 47.  Repeat labs revealed persistently elevated LFTs, GGT 214.  CBC revealed polycythemia, normal platelets, PT/INR normal.  Patient was seen by Dr. Janese Banks yesterday, MRI abdomen is scheduled.  Further labs revealed significantly elevated CA 19-9 147, AFP 37,822.  Hepatitis B-, hep C, HIV negative  Patient denies any GI symptoms other than fatigue.  He denies any abdominal pain, nausea or vomiting, rectal bleeding, black stools.  Patient denies any swelling of legs,  abdominal distention, nausea, vomiting. He never had an endoscopy evaluation in the past.  Patient is accompanied by his wife today   NSAIDs: None  Antiplts/Anticoagulants/Anti thrombotics: None  GI Procedures: None He denies family history of liver cancer, liver disease  Past Medical History:  Diagnosis Date  . CAD (coronary artery disease)   . Heart disease     Past Surgical History:  Procedure Laterality Date  . CORONARY ARTERY BYPASS GRAFT     at 65 years old  . VEIN BYPASS SURGERY      Current Outpatient Medications:  .  fluticasone (FLONASE) 50 MCG/ACT nasal spray, Place 1 spray into both nostrils daily., Disp: , Rfl:    Family History  Problem Relation Age of Onset  . Dementia Mother   . Ulcers Father   . Heart attack Brother   . Throat cancer Brother   . Heart attack Daughter   . Diabetes Daughter   . Depression Daughter      Social History   Tobacco Use  . Smoking status: Current Every Day Smoker    Packs/day: 1.00    Types: Cigarettes  . Smokeless tobacco: Never Used  Vaping Use  . Vaping Use: Never used  Substance Use Topics  . Alcohol use: Not Currently  . Drug use: Not Currently    Allergies as of 04/11/2020  . (No Known Allergies)    Review of Systems:    All systems reviewed and negative except where noted in HPI.   Physical Exam:  BP  130/80   Pulse 78   Temp 98.3 F (36.8 C) (Oral)   Ht 5\' 10"  (1.778 m)   Wt 160 lb (72.6 kg)   BMI 22.96 kg/m  No LMP for male patient.  General:   Alert, moderately built, moderately nourished, pleasant and cooperative in NAD Head:  Normocephalic and atraumatic. Eyes:  Sclera clear, no icterus.   Conjunctiva pink. Ears:  Normal auditory acuity. Nose:  No deformity, discharge, or lesions. Mouth:  No deformity or lesions,oropharynx pink & moist. Neck:  Supple; no masses or thyromegaly. Lungs:  Respirations even and unlabored.  Clear throughout to auscultation.   No wheezes, crackles, or rhonchi.  No acute distress. Heart:  Regular rate and rhythm; no murmurs, clicks, rubs, or gallops. Abdomen:  Normal bowel sounds. Soft, non-tender and mildly distended without masses, hepatosplenomegaly or hernias noted.  No guarding or rebound tenderness.   Rectal: Not performed Msk:  Symmetrical without gross deformities. Good, equal movement & strength bilaterally. Pulses:  Normal pulses noted. Extremities:  No clubbing or edema.  No cyanosis. Neurologic:  Alert and oriented x3;  grossly normal neurologically. Skin:  Intact without significant lesions or rashes. No jaundice. Psych:  Alert and cooperative. Normal mood and affect.  Imaging Studies: Reviewed  Assessment and Plan:   Huston Edwards is a 65 y.o. pleasant Caucasian male with history of heavy tobacco use, history of coronary disease s/p CABG at age of 82, recent COVID-65 infection, found to have 2 large liver lesions and subcentimeter nodules in the lung, significantly elevated serum AFP as well as CA 19-9.  Waiting for MRI abdomen to further characterize the liver lesions.  Given his strong smoking history, he is at risk for pancreatic cancer as well as colon cancer.  I will hold off on any further investigation until we have the MRI results.  If he needs a colonoscopy based on the MRI results, we can schedule in the near future.  Unclear if patient really has cirrhosis of liver based on serology and clinical exam, at least he does not have decompensated cirrhosis.  His HIV, hep C, hep B negative.  No heavy alcohol use in his history.  At this point, I do not recommend any further work-up while waiting to confirm diagnosis of his cancer   Follow up as needed  Maxwell Darby, MD

## 2020-04-12 ENCOUNTER — Other Ambulatory Visit: Payer: 59

## 2020-04-13 ENCOUNTER — Other Ambulatory Visit: Payer: Self-pay

## 2020-04-13 ENCOUNTER — Ambulatory Visit
Admission: RE | Admit: 2020-04-13 | Discharge: 2020-04-13 | Disposition: A | Discharge status: Home / Self Care | Payer: 59 | Source: Ambulatory Visit | Attending: Oncology | Admitting: Oncology

## 2020-04-13 DIAGNOSIS — R634 Abnormal weight loss: Secondary | ICD-10-CM | POA: Diagnosis present

## 2020-04-13 DIAGNOSIS — R911 Solitary pulmonary nodule: Secondary | ICD-10-CM | POA: Diagnosis present

## 2020-04-13 DIAGNOSIS — R160 Hepatomegaly, not elsewhere classified: Secondary | ICD-10-CM | POA: Diagnosis present

## 2020-04-13 MED ORDER — GADOBUTROL 1 MMOL/ML IV SOLN
7.0000 mL | Freq: Once | INTRAVENOUS | Status: AC | PRN
  Administered 2020-04-13: 7 mL via INTRAVENOUS

## 2020-04-15 ENCOUNTER — Other Ambulatory Visit: Payer: Self-pay | Admitting: Oncology

## 2020-04-16 ENCOUNTER — Encounter: Payer: Self-pay | Admitting: Oncology

## 2020-04-16 ENCOUNTER — Ambulatory Visit: Payer: 59 | Admitting: Nurse Practitioner

## 2020-04-16 ENCOUNTER — Telehealth: Payer: Self-pay | Admitting: *Deleted

## 2020-04-16 ENCOUNTER — Inpatient Hospital Stay: Payer: 59 | Admitting: Oncology

## 2020-04-16 ENCOUNTER — Other Ambulatory Visit: Payer: Self-pay | Admitting: *Deleted

## 2020-04-16 DIAGNOSIS — R160 Hepatomegaly, not elsewhere classified: Secondary | ICD-10-CM

## 2020-04-16 NOTE — Telephone Encounter (Signed)
Dr. Janese Banks thinks that we need liver bx and we should wait to come in until after bx and then she can go over the bx and the mri. Pt is agreeable and I will cancel appt today and will call him when I get date for liver bx/.

## 2020-04-17 ENCOUNTER — Encounter: Payer: Self-pay | Admitting: Oncology

## 2020-04-19 ENCOUNTER — Telehealth: Payer: Self-pay | Admitting: *Deleted

## 2020-04-19 ENCOUNTER — Inpatient Hospital Stay: Payer: 59

## 2020-04-19 NOTE — Progress Notes (Signed)
Tumor Board Documentation  Maxwell Edwards was presented by Dr Janese Banks at our Tumor Board on 04/19/2020, which included representatives from medical oncology,radiation oncology,internal medicine,navigation,pathology,radiology,surgical,pharmacy,genetics,research,palliative care,pulmonology.  Maxwell Edwards currently presents as a new patient,for discussion with history of the following treatments: active survellience.  Additionally, we reviewed previous medical and familial history, history of present illness, and recent lab results along with all available histopathologic and imaging studies. The tumor board considered available treatment options and made the following recommendations: Immunotherapy Y 90 after immunotherapy  The following procedures/referrals were also placed: No orders of the defined types were placed in this encounter.   Clinical Trial Status: not discussed   Staging used: Clinical Stage  AJCC Staging: T: 4 N: 0 M: 0 Group: Stage III B Hepatocelluar Carcinoma   National site-specific guidelines   were discussed with respect to the case.  Tumor board is a meeting of clinicians from various specialty areas who evaluate and discuss patients for whom a multidisciplinary approach is being considered. Final determinations in the plan of care are those of the provider(s). The responsibility for follow up of recommendations given during tumor board is that of the provider.   Today's extended care, comprehensive team conference, Maxwell Edwards was not present for the discussion and was not examined.   Multidisciplinary Tumor Board is a multidisciplinary case peer review process.  Decisions discussed in the Multidisciplinary Tumor Board reflect the opinions of the specialists present at the conference without having examined the patient.  Ultimately, treatment and diagnostic decisions rest with the primary provider(s) and the patient.

## 2020-04-19 NOTE — Telephone Encounter (Signed)
Called and spoke to [place] and she was with pt. And I told them that the case was discussed in conference and he does not need biopsy. Dr. Janese Banks would like to have them come in tom 2:45 and they are agreeable and I have called and cancelled the bx order.

## 2020-04-19 NOTE — Telephone Encounter (Signed)
I called back and told them it was 2:30 instead of 2:45. I read it wrong

## 2020-04-20 ENCOUNTER — Inpatient Hospital Stay (HOSPITAL_BASED_OUTPATIENT_CLINIC_OR_DEPARTMENT_OTHER): Payer: 59 | Admitting: Oncology

## 2020-04-20 ENCOUNTER — Encounter: Payer: Self-pay | Admitting: Oncology

## 2020-04-20 ENCOUNTER — Other Ambulatory Visit: Payer: 59

## 2020-04-20 ENCOUNTER — Other Ambulatory Visit: Payer: Self-pay

## 2020-04-20 VITALS — BP 154/79 | HR 88 | Temp 98.2°F | Resp 20 | Wt 161.4 lb

## 2020-04-20 DIAGNOSIS — C220 Liver cell carcinoma: Secondary | ICD-10-CM

## 2020-04-20 DIAGNOSIS — K7469 Other cirrhosis of liver: Secondary | ICD-10-CM

## 2020-04-20 DIAGNOSIS — Z7189 Other specified counseling: Secondary | ICD-10-CM

## 2020-04-20 DIAGNOSIS — R911 Solitary pulmonary nodule: Secondary | ICD-10-CM

## 2020-04-20 NOTE — Research (Signed)
Trial:  [employer] 2018/01 and 2018/01B  Patient Notnamed Scholz was identified by Jeral Fruit, RN research nurse as a potential candidate for the above listed study.  This Clinical Research Nurse met with Cornelio Parkerson, [id number], on 04/20/20 in a manner and location that ensures patient privacy to discuss participation in the above listed research study.  Patient is Accompanied by his daughter.  A copy of the informed consent document with embedded [employer] version 1.0 dated 03/22/2019 and 2018/01B version 3.0 dated 01/05/2018 was provided to the patient.  Patient reads, speaks, and understands [place].   Patient was provided with the business card of this Nurse and encouraged to contact the research team with any questions.  Approximately 15 minutes were spent with the patient reviewing the informed consent documents.  Patient was provided the option of taking informed consent documents home to review and was encouraged to review at their convenience with their support network, including other care providers. Patient is comfortable with making a decision regarding study participation today. He has decided not to participate in the protocol at this time. Research staff thanked the patient for his consideration in participating in the clinical trial. The patient was encouraged to call the research office if he changes his mind prior to receiving any treatment.  Jeral Fruit, RN 04/20/20 3:58 PM

## 2020-04-21 ENCOUNTER — Encounter: Payer: Self-pay | Admitting: Oncology

## 2020-04-21 DIAGNOSIS — C220 Liver cell carcinoma: Secondary | ICD-10-CM | POA: Insufficient documentation

## 2020-04-21 DIAGNOSIS — Z7189 Other specified counseling: Secondary | ICD-10-CM | POA: Insufficient documentation

## 2020-04-21 MED ORDER — LIDOCAINE-PRILOCAINE 2.5-2.5 % EX CREA: TOPICAL_CREAM | CUTANEOUS | 3 refills | Status: DC

## 2020-04-21 MED ORDER — ONDANSETRON HCL 8 MG PO TABS: 8.0000 mg | ORAL_TABLET | Freq: Two times a day (BID) | ORAL | 1 refills | Status: DC | PRN

## 2020-04-21 MED ORDER — PROCHLORPERAZINE MALEATE 10 MG PO TABS: 10.0000 mg | ORAL_TABLET | Freq: Four times a day (QID) | ORAL | 1 refills | Status: DC | PRN

## 2020-04-21 NOTE — H&P (View-Only) (Signed)
Hematology/Oncology Consult note [facility]  [phone] [phone]  Patient Care Team: Venita Lick, NP as PCP - General (Nurse Practitioner) Clent Jacks, RN as Oncology Nurse Navigator   Name of the patient: Maxwell Edwards  [id number]  1956/03/01   Date of visit: 04/21/20  Diagnosis-multifocal locally advanced hepatocellular carcinoma  Chief complaint/ Reason for visit-discussed treatment options for hepatocellular carcinoma  Heme/Onc history: patient is a 65 year old male with a past medical history significant for hypertension hyperlipidemia, 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  Reports that he has lost about 20 pounds over the last 2 to 3 months. He is a chronic smoker which prompted a CT chest.  CT chest without contrast on 04/06/2020 showed no evidence of mediastinal or hilar adenopathy.  He was found to have 4 subcentimeter lung nodules 3 in the right lobe and one in the left upper lobe which were borderline for metastatic disease.  Cirrhotic liver morphology with 8.3 x 7 cm ill-defined hypoattenuating lesion in the left liver with a 3.3 cm lesion in the dome of the liver near the junction of the right and left hepatic lobes.  Labs showed elevated AST and ALT of 62 and 47 respectively with a normal albumin and normal total bilirubin.  CMP showed elevated H&H of 18.3/53.8.  Hepatitis C and HIV testing was negative.   MRI showed multifocal HCC with the largest mass measuring 7.9 x 6.9 x 6.9 cm in the lateral segment of the left hepatic lobe associated with tumor thrombus in the left portal vein.  Another mass in the segment 4A measuring 3.4 x 2.9 cm.  No evidence of local regional adenopathy.  Alpha-fetoprotein elevated at 38,000.  CA 19-9 elevated at 147.Patient's case discussed at tumor board and consensus was that this would be consistent with [facility] especially given the AFP value.   Initially MRI was not characteristic for [facility] but a biopsy was not deemed necessary after AFP results were back  Interval history-patient reports ongoing fatigue.  Denies any significant pain  ECOG PS- 1 Pain scale- 0   Review of systems- Review of Systems  Constitutional: Positive for malaise/fatigue and weight loss. Negative for chills and fever.  HENT: Negative for congestion, ear discharge and nosebleeds.   Eyes: Negative for blurred vision.  Respiratory: Negative for cough, hemoptysis, sputum production, shortness of breath and wheezing.   Cardiovascular: Negative for chest pain, palpitations, orthopnea and claudication.  Gastrointestinal: Negative for abdominal pain, blood in stool, constipation, diarrhea, heartburn, melena, nausea and vomiting.  Genitourinary: Negative for dysuria, flank pain, frequency, hematuria and urgency.  Musculoskeletal: Negative for back pain, joint pain and myalgias.  Skin: Negative for rash.  Neurological: Negative for dizziness, tingling, focal weakness, seizures, weakness and headaches.  Endo/Heme/Allergies: Does not bruise/bleed easily.  Psychiatric/Behavioral: Negative for depression and suicidal ideas. The patient does not have insomnia.       No Known Allergies   Past Medical History:  Diagnosis Date  . CAD (coronary artery disease)   . Heart disease      Past Surgical History:  Procedure Laterality Date  . CORONARY ARTERY BYPASS GRAFT     at 66 years old  . VEIN BYPASS SURGERY      Social History   Socioeconomic History  . Marital status: Divorced    Spouse name: Not on file  . Number of children: Not on file  . Years of education:  Not on file  . Highest education level: Not on file  Occupational History  . Not on file  Tobacco Use  . Smoking status: Current Every Day Smoker    Packs/day: 1.00    Types: Cigarettes  . Smokeless tobacco: Never Used  Vaping Use  . Vaping Use: Never used  Substance and Sexual Activity  .  Alcohol use: Not Currently  . Drug use: Not Currently  . Sexual activity: Not Currently  Other Topics Concern  . Not on file  Social History Narrative  . Not on file   Social Determinants of Health   Financial Resource Strain: Not on file  Food Insecurity: Not on file  Transportation Needs: Not on file  Physical Activity: Not on file  Stress: Not on file  Social Connections: Not on file  Intimate Partner Violence: Not on file    Family History  Problem Relation Age of Onset  . Dementia Mother   . Ulcers Father   . Heart attack Brother   . Throat cancer Brother   . Heart attack Daughter   . Diabetes Daughter   . Depression Daughter      Current Outpatient Medications:  .  fluticasone (FLONASE) 50 MCG/ACT nasal spray, Place 1 spray into both nostrils daily., Disp: , Rfl:   Physical exam:  Vitals:   04/20/20 1432  BP: (!) 154/79  Pulse: 88  Resp: 20  Temp: 98.2 F (36.8 C)  TempSrc: Tympanic  SpO2: 100%  Weight: 161 lb 6.4 oz (73.2 kg)   Physical Exam Constitutional:      General: He is not in acute distress. Eyes:     Extraocular Movements: EOM normal.     Pupils: Pupils are equal, round, and reactive to light.  Cardiovascular:     Rate and Rhythm: Normal rate.  Pulmonary:     Effort: Pulmonary effort is normal.  Skin:    General: Skin is warm and dry.  Neurological:     Mental Status: He is alert and oriented to person, place, and time.      CMP Latest Ref Rng & Units 04/10/2020  Glucose 70 - 99 mg/dL 92  BUN 8 - 23 mg/dL 15  Creatinine 0.61 - 1.24 mg/dL 0.89  Sodium 135 - 145 mmol/L 135  Potassium 3.5 - 5.1 mmol/L 4.2  Chloride 98 - 111 mmol/L 97(L)  CO2 22 - 32 mmol/L 27  Calcium 8.9 - 10.3 mg/dL 9.6  Total Protein 6.5 - 8.1 g/dL 9.0(H)  Total Bilirubin 0.3 - 1.2 mg/dL 0.9  Alkaline Phos 38 - 126 U/L 272(H)  AST 15 - 41 U/L 70(H)  ALT 0 - 44 U/L 61(H)   CBC Latest Ref Rng & Units 04/06/2020  WBC 3.4 - 10.8 x10E3/uL 10.0  Hemoglobin 13.0 -  17.7 g/dL 18.3(H)  Hematocrit 37.5 - 51.0 % 53.8(H)  Platelets 150 - 450 x10E3/uL 247    No images are attached to the encounter.  CT Chest Wo Contrast  Result Date: 04/06/2020 CLINICAL DATA:  Unintended weight loss.  Smoking history. EXAM: CT CHEST WITHOUT CONTRAST TECHNIQUE: Multidetector CT imaging of the chest was performed following the standard protocol without IV contrast. COMPARISON:  None. FINDINGS: Cardiovascular: The heart size is normal. No substantial pericardial effusion. Coronary artery calcification is evident. Atherosclerotic calcification is noted in the wall of the thoracic aorta. Mediastinum/Nodes: No mediastinal lymphadenopathy. No evidence for gross hilar lymphadenopathy although assessment is limited by the lack of intravenous contrast on today's study. The esophagus  has normal imaging features. There is no axillary lymphadenopathy. Lungs/Pleura: Centrilobular emphsyema noted. 4 mm right middle lobe nodule identified on image 84/2 with subtle right middle lobe tree-in-bud nodularity seen on image 86/2. 4 mm medial right upper lobe nodule identified on image 19/series 2. Possible central 8 mm right middle lobe nodule on axial 81/2 and coronal 59/5. 3 mm left upper lobe nodule identified on 69/2. No overtly suspicious nodule or mass. No focal airspace consolidation. No pleural effusion. Upper Abdomen: Diffusely nodular hepatic contour is compatible with cirrhosis. 8.3 x 7.0 cm ill-defined hypoattenuating lesion is identified in the left liver. 3.3 cm lesion is noted in the dome of the liver near the junction of the right and left hepatic lobes (103/3). Upper normal lymph nodes are seen in the gastrohepatic and hepatoduodenal ligaments. Recanalization of the paraumbilical vein suggests portal venous hypertension. Musculoskeletal: No worrisome lytic or sclerotic osseous abnormality. IMPRESSION: 1. Cirrhotic liver morphology with 8.3 x 7.0 cm ill-defined hypoattenuating lesion in the left  liver with a 3.3 cm lesion in the dome of the liver. Given the apparent cirrhosis, imaging features are highly concerning for hepatocellular carcinoma. MRI of the abdomen without and with contrast recommended to further evaluate. 2. Scattered bilateral pulmonary nodules measuring up to 8 mm. Close follow-up recommended as metastatic disease not excluded. 3. Recanalization of the paraumbilical vein suggests portal venous hypertension. 4. Aortic Atherosclerosis (ICD10-I70.0) and Emphysema (ICD10-J43.9). These results will be called to the ordering clinician or representative by the Radiologist Assistant, and communication documented in the PACS or [employer]. Electronically Signed   By: Misty Stanley M.D.   On: 04/06/2020 14:48   MR Abdomen W Wo Contrast  Result Date: 04/13/2020 CLINICAL DATA:  Liver mass is seen on chest CT, for further characterization. Cirrhosis. EXAM: MRI ABDOMEN WITHOUT AND WITH CONTRAST TECHNIQUE: Multiplanar multisequence MR imaging of the abdomen was performed both before and after the administration of intravenous contrast. CONTRAST:  70mL GADAVIST GADOBUTROL 1 MMOL/ML IV SOLN COMPARISON:  04/06/2020 FINDINGS: Lower chest: Median sternotomy. Hepatobiliary: Background hemochromatosis involving the liver with low T1 and T2 signal and signal dropout on in-phase imaging. Nodular hepatic contour compatible cirrhosis. Infiltrating 7.9 by 6.9 by 6.9 cm mass in the lateral segment left hepatic lobe with associated extensor of enhancing tumor thrombus in the left portal vein as well as numerous additional multifocal enhancing tumors throughout the left hepatic lobe and some in the right hepatic lobe. Another mass in segment 4a of the liver measures 3.4 by 2.9 cm on image 4 of series 6. These masses demonstrate mostly low-grade enhancement including low-grade arterial phase enhancement. Many of these continue to demonstrate delayed enhancement without a characteristic washout or capsule  appearance. Pancreas:  Unremarkable Spleen: The spleen measures 13.8 by 6.6 by 8.8 cm (volume = 420 cm^3). Adrenals/Urinary Tract:  Unremarkable Stomach/Bowel: Unremarkable Vascular/Lymphatic: Aortoiliac atherosclerotic vascular disease. Small porta hepatis lymph nodes are not pathologically enlarged. Other:  No supplemental non-categorized findings. Musculoskeletal: Unremarkable IMPRESSION: 1. Infiltrating 7.9 by 6.9 by 6.9 cm mass in the lateral segment left hepatic lobe with associated enhancing tumor thrombus filling the left portal vein as well as numerous additional multifocal enhancing tumors throughout the left hepatic lobe and some in the right hepatic lobe. Although otherwise suspicious for multifocal hepatocellular carcinoma, the enhancement pattern is not otherwise specific, with only low-grade arterial phase enhancement and persistent delayed phase enhancement which would be somewhat atypical for [facility]. Accordingly, the possibility of cholangiocarcinoma or metastatic disease cannot be  readily excluded and tissue diagnosis might be appropriate. Consider multidisciplinary cancer conference referral for further discussion. 2. Background hemochromatosis involving the liver. Borderline splenomegaly. 3.  Aortic Atherosclerosis (ICD10-I70.0). Electronically Signed   By: Van Clines M.D.   On: 04/13/2020 14:00     Assessment and plan- Patient is a 65 y.o. male with newly diagnosed stage IIIb multifocal locally advanced hepatocellular carcinoma cT4 N0 M0 here to discuss further management  I have reviewed MRI abdomen images independently.  We have also discussed patient's case at tumor board.  I have reviewed these findings with patient and his daughter.  Although the imaging characteristics on MRI were not definitively characteristic for [facility], as per discussion at tumor board given that his AFP was 38,000 in the setting of underlying cirrhosis, this would be consistent with [place] and biopsy was not  deemed to be necessary.  Patient has multifocal HCC with 2 dominant masses the largest one measuring 7.9 cm.  There is also associated portal vein thrombus.  Due to these reasons he is not a candidate for resection as well as transplantation.  Based on his labs he is a child Pugh A with a performance status of 1.  I would therefore recommend moving ahead with systemic therapy with a combination of Tecentriq and bevacizumab given IV every 3 weeks until progression or toxicity.  This was based on the phase 3 trial looking at [facility] plus bevacizumab versus sorafenib which showed an improvement in overall survival at 19 months versus 15 months.  Discussed risks and benefits of Tecentriq including all but not limited to autoimmune side effect such as colitis, pneumonitis, thyroiditis and need to monitor kidney and liver functions.  Discussed risks and benefits of bevacizumab including all but not limited to hypertension, risk of thromboembolic events as well as bleeding, proteinuria, leg swelling.  Treatment will be given with a palliative intent.  Patient understands and agrees to proceed as planned.  If he has an excellent response to treatment liver directed therapy such as Y 90 could be considered down the line.  I did offer a consultation visit with interventional radiology at this time but patient would like to hold off on that for now.  I would like the patient to proceed with an upper endoscopy to assess if he has esophageal varices and if they need treatment since he is starting bevacizumab.  However it is not necessary to wait for the upper endoscopy before starting treatment as long as he can get it within the next 1 to 2 months.  I did see the mention of possible hemochromatosis on the MRI and I will plan to check HFE gene testing with his next set of blood work.  Patient will need port placement for treatment as well as chemo teach.  I will tentatively see him back in 2 weeks time to start first  treatment of Tecentriq plus bevacizumab.  Patient and his daughter had multiple insightful questions and all of them were answered to their satisfaction  Visit Diagnosis 1. Cancer, hepatocellular ([place])   2. Goals of care, counseling/discussion      Dr. Randa Evens, MD, MPH [facility] [phone] 04/21/2020 9:09 AM

## 2020-04-21 NOTE — Progress Notes (Signed)
Hematology/Oncology Consult note [facility]  Telephone:([phone] Fax:([phone]  Patient Care Team: Venita Lick, NP as PCP - General (Nurse Practitioner) Clent Jacks, RN as Oncology Nurse Navigator   Name of the patient: Maxwell Edwards  [id number]  [birth year]   Date of visit: 04/21/20  Diagnosis-multifocal locally advanced hepatocellular carcinoma  Chief complaint/ Reason for visit-discussed treatment options for hepatocellular carcinoma  Heme/Onc history: patient is a 65 year old male with a past medical history significant for hypertension hyperlipidemia, 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  Reports that he has lost about 20 pounds over the last 2 to 3 months. He is a chronic smoker which prompted a CT chest.  CT chest without contrast on 04/06/2020 showed no evidence of mediastinal or hilar adenopathy.  He was found to have 4 subcentimeter lung nodules 3 in the right lobe and one in the left upper lobe which were borderline for metastatic disease.  Cirrhotic liver morphology with 8.3 x 7 cm ill-defined hypoattenuating lesion in the left liver with a 3.3 cm lesion in the dome of the liver near the junction of the right and left hepatic lobes.  Labs showed elevated AST and ALT of 62 and 47 respectively with a normal albumin and normal total bilirubin.  CMP showed elevated H&H of 18.3/53.8.  Hepatitis C and HIV testing was negative.   MRI showed multifocal HCC with the largest mass measuring 7.9 x 6.9 x 6.9 cm in the lateral segment of the left hepatic lobe associated with tumor thrombus in the left portal vein.  Another mass in the segment 4A measuring 3.4 x 2.9 cm.  No evidence of local regional adenopathy.  Alpha-fetoprotein elevated at 38,000.  CA 19-9 elevated at 147.Patient's case discussed at tumor board and consensus was that this would be consistent with [facility] especially given the AFP value.   Initially MRI was not characteristic for [facility] but a biopsy was not deemed necessary after AFP results were back  Interval history-patient reports ongoing fatigue.  Denies any significant pain  ECOG PS- 1 Pain scale- 0   Review of systems- Review of Systems  Constitutional: Positive for malaise/fatigue and weight loss. Negative for chills and fever.  HENT: Negative for congestion, ear discharge and nosebleeds.   Eyes: Negative for blurred vision.  Respiratory: Negative for cough, hemoptysis, sputum production, shortness of breath and wheezing.   Cardiovascular: Negative for chest pain, palpitations, orthopnea and claudication.  Gastrointestinal: Negative for abdominal pain, blood in stool, constipation, diarrhea, heartburn, melena, nausea and vomiting.  Genitourinary: Negative for dysuria, flank pain, frequency, hematuria and urgency.  Musculoskeletal: Negative for back pain, joint pain and myalgias.  Skin: Negative for rash.  Neurological: Negative for dizziness, tingling, focal weakness, seizures, weakness and headaches.  Endo/Heme/Allergies: Does not bruise/bleed easily.  Psychiatric/Behavioral: Negative for depression and suicidal ideas. The patient does not have insomnia.       No Known Allergies   Past Medical History:  Diagnosis Date  . CAD (coronary artery disease)   . Heart disease      Past Surgical History:  Procedure Laterality Date  . CORONARY ARTERY BYPASS GRAFT     at 65 years old  . VEIN BYPASS SURGERY      Social History   Socioeconomic History  . Marital status: Divorced    Spouse name: Not on file  . Number of children: Not on file  . Years of education:  Not on file  . Highest education level: Not on file  Occupational History  . Not on file  Tobacco Use  . Smoking status: Current Every Day Smoker    Packs/day: 1.00    Types: Cigarettes  . Smokeless tobacco: Never Used  Vaping Use  . Vaping Use: Never used  Substance and Sexual Activity  .  Alcohol use: Not Currently  . Drug use: Not Currently  . Sexual activity: Not Currently  Other Topics Concern  . Not on file  Social History Narrative  . Not on file   Social Determinants of Health   Financial Resource Strain: Not on file  Food Insecurity: Not on file  Transportation Needs: Not on file  Physical Activity: Not on file  Stress: Not on file  Social Connections: Not on file  Intimate Partner Violence: Not on file    Family History  Problem Relation Age of Onset  . Dementia Mother   . Ulcers Father   . Heart attack Brother   . Throat cancer Brother   . Heart attack Daughter   . Diabetes Daughter   . Depression Daughter      Current Outpatient Medications:  .  fluticasone (FLONASE) 50 MCG/ACT nasal spray, Place 1 spray into both nostrils daily., Disp: , Rfl:   Physical exam:  Vitals:   04/20/20 1432  BP: (!) 154/79  Pulse: 88  Resp: 20  Temp: 98.2 F (36.8 C)  TempSrc: Tympanic  SpO2: 100%  Weight: 161 lb 6.4 oz (73.2 kg)   Physical Exam Constitutional:      General: He is not in acute distress. Eyes:     Extraocular Movements: EOM normal.     Pupils: Pupils are equal, round, and reactive to light.  Cardiovascular:     Rate and Rhythm: Normal rate.  Pulmonary:     Effort: Pulmonary effort is normal.  Skin:    General: Skin is warm and dry.  Neurological:     Mental Status: He is alert and oriented to person, place, and time.      CMP Latest Ref Rng & Units 04/10/2020  Glucose 70 - 99 mg/dL 92  BUN 8 - 23 mg/dL 15  Creatinine 0.61 - 1.24 mg/dL 0.89  Sodium 135 - 145 mmol/L 135  Potassium 3.5 - 5.1 mmol/L 4.2  Chloride 98 - 111 mmol/L 97(L)  CO2 22 - 32 mmol/L 27  Calcium 8.9 - 10.3 mg/dL 9.6  Total Protein 6.5 - 8.1 g/dL 9.0(H)  Total Bilirubin 0.3 - 1.2 mg/dL 0.9  Alkaline Phos 38 - 126 U/L 272(H)  AST 15 - 41 U/L 70(H)  ALT 0 - 44 U/L 61(H)   CBC Latest Ref Rng & Units 04/06/2020  WBC 3.4 - 10.8 x10E3/uL 10.0  Hemoglobin 13.0 -  17.7 g/dL 18.3(H)  Hematocrit 37.5 - 51.0 % 53.8(H)  Platelets 150 - 450 x10E3/uL 247    No images are attached to the encounter.  CT Chest Wo Contrast  Result Date: 04/06/2020 CLINICAL DATA:  Unintended weight loss.  Smoking history. EXAM: CT CHEST WITHOUT CONTRAST TECHNIQUE: Multidetector CT imaging of the chest was performed following the standard protocol without IV contrast. COMPARISON:  None. FINDINGS: Cardiovascular: The heart size is normal. No substantial pericardial effusion. Coronary artery calcification is evident. Atherosclerotic calcification is noted in the wall of the thoracic aorta. Mediastinum/Nodes: No mediastinal lymphadenopathy. No evidence for gross hilar lymphadenopathy although assessment is limited by the lack of intravenous contrast on today's study. The esophagus  has normal imaging features. There is no axillary lymphadenopathy. Lungs/Pleura: Centrilobular emphsyema noted. 4 mm right middle lobe nodule identified on image 84/2 with subtle right middle lobe tree-in-bud nodularity seen on image 86/2. 4 mm medial right upper lobe nodule identified on image 19/series 2. Possible central 8 mm right middle lobe nodule on axial 81/2 and coronal 59/5. 3 mm left upper lobe nodule identified on 69/2. No overtly suspicious nodule or mass. No focal airspace consolidation. No pleural effusion. Upper Abdomen: Diffusely nodular hepatic contour is compatible with cirrhosis. 8.3 x 7.0 cm ill-defined hypoattenuating lesion is identified in the left liver. 3.3 cm lesion is noted in the dome of the liver near the junction of the right and left hepatic lobes (103/3). Upper normal lymph nodes are seen in the gastrohepatic and hepatoduodenal ligaments. Recanalization of the paraumbilical vein suggests portal venous hypertension. Musculoskeletal: No worrisome lytic or sclerotic osseous abnormality. IMPRESSION: 1. Cirrhotic liver morphology with 8.3 x 7.0 cm ill-defined hypoattenuating lesion in the left  liver with a 3.3 cm lesion in the dome of the liver. Given the apparent cirrhosis, imaging features are highly concerning for hepatocellular carcinoma. MRI of the abdomen without and with contrast recommended to further evaluate. 2. Scattered bilateral pulmonary nodules measuring up to 8 mm. Close follow-up recommended as metastatic disease not excluded. 3. Recanalization of the paraumbilical vein suggests portal venous hypertension. 4. Aortic Atherosclerosis (ICD10-I70.0) and Emphysema (ICD10-J43.9). These results will be called to the ordering clinician or representative by the Radiologist Assistant, and communication documented in the PACS or [employer]. Electronically Signed   By: Misty Stanley M.D.   On: 04/06/2020 14:48   MR Abdomen W Wo Contrast  Result Date: 04/13/2020 CLINICAL DATA:  Liver mass is seen on chest CT, for further characterization. Cirrhosis. EXAM: MRI ABDOMEN WITHOUT AND WITH CONTRAST TECHNIQUE: Multiplanar multisequence MR imaging of the abdomen was performed both before and after the administration of intravenous contrast. CONTRAST:  74mL GADAVIST GADOBUTROL 1 MMOL/ML IV SOLN COMPARISON:  04/06/2020 FINDINGS: Lower chest: Median sternotomy. Hepatobiliary: Background hemochromatosis involving the liver with low T1 and T2 signal and signal dropout on in-phase imaging. Nodular hepatic contour compatible cirrhosis. Infiltrating 7.9 by 6.9 by 6.9 cm mass in the lateral segment left hepatic lobe with associated extensor of enhancing tumor thrombus in the left portal vein as well as numerous additional multifocal enhancing tumors throughout the left hepatic lobe and some in the right hepatic lobe. Another mass in segment 4a of the liver measures 3.4 by 2.9 cm on image 4 of series 6. These masses demonstrate mostly low-grade enhancement including low-grade arterial phase enhancement. Many of these continue to demonstrate delayed enhancement without a characteristic washout or capsule  appearance. Pancreas:  Unremarkable Spleen: The spleen measures 13.8 by 6.6 by 8.8 cm (volume = 420 cm^3). Adrenals/Urinary Tract:  Unremarkable Stomach/Bowel: Unremarkable Vascular/Lymphatic: Aortoiliac atherosclerotic vascular disease. Small porta hepatis lymph nodes are not pathologically enlarged. Other:  No supplemental non-categorized findings. Musculoskeletal: Unremarkable IMPRESSION: 1. Infiltrating 7.9 by 6.9 by 6.9 cm mass in the lateral segment left hepatic lobe with associated enhancing tumor thrombus filling the left portal vein as well as numerous additional multifocal enhancing tumors throughout the left hepatic lobe and some in the right hepatic lobe. Although otherwise suspicious for multifocal hepatocellular carcinoma, the enhancement pattern is not otherwise specific, with only low-grade arterial phase enhancement and persistent delayed phase enhancement which would be somewhat atypical for [facility]. Accordingly, the possibility of cholangiocarcinoma or metastatic disease cannot be  readily excluded and tissue diagnosis might be appropriate. Consider multidisciplinary cancer conference referral for further discussion. 2. Background hemochromatosis involving the liver. Borderline splenomegaly. 3.  Aortic Atherosclerosis (ICD10-I70.0). Electronically Signed   By: Van Clines M.D.   On: 04/13/2020 14:00     Assessment and plan- Patient is a 65 y.o. male with newly diagnosed stage IIIb multifocal locally advanced hepatocellular carcinoma cT4 N0 M0 here to discuss further management  I have reviewed MRI abdomen images independently.  We have also discussed patient's case at tumor board.  I have reviewed these findings with patient and his daughter.  Although the imaging characteristics on MRI were not definitively characteristic for [facility], as per discussion at tumor board given that his AFP was 38,000 in the setting of underlying cirrhosis, this would be consistent with [place] and biopsy was not  deemed to be necessary.  Patient has multifocal HCC with 2 dominant masses the largest one measuring 7.9 cm.  There is also associated portal vein thrombus.  Due to these reasons he is not a candidate for resection as well as transplantation.  Based on his labs he is a child Pugh A with a performance status of 1.  I would therefore recommend moving ahead with systemic therapy with a combination of Tecentriq and bevacizumab given IV every 3 weeks until progression or toxicity.  This was based on the phase 3 trial looking at [facility] plus bevacizumab versus sorafenib which showed an improvement in overall survival at 19 months versus 15 months.  Discussed risks and benefits of Tecentriq including all but not limited to autoimmune side effect such as colitis, pneumonitis, thyroiditis and need to monitor kidney and liver functions.  Discussed risks and benefits of bevacizumab including all but not limited to hypertension, risk of thromboembolic events as well as bleeding, proteinuria, leg swelling.  Treatment will be given with a palliative intent.  Patient understands and agrees to proceed as planned.  If he has an excellent response to treatment liver directed therapy such as Y 90 could be considered down the line.  I did offer a consultation visit with interventional radiology at this time but patient would like to hold off on that for now.  I would like the patient to proceed with an upper endoscopy to assess if he has esophageal varices and if they need treatment since he is starting bevacizumab.  However it is not necessary to wait for the upper endoscopy before starting treatment as long as he can get it within the next 1 to 2 months.  I did see the mention of possible hemochromatosis on the MRI and I will plan to check HFE gene testing with his next set of blood work.  Patient will need port placement for treatment as well as chemo teach.  I will tentatively see him back in 2 weeks time to start first  treatment of Tecentriq plus bevacizumab.  Patient and his daughter had multiple insightful questions and all of them were answered to their satisfaction  Visit Diagnosis 1. Cancer, hepatocellular ([place])   2. Goals of care, counseling/discussion      Dr. Randa Evens, MD, MPH [facility] [phone] 04/21/2020 9:09 AM

## 2020-04-21 NOTE — Progress Notes (Signed)
START OFF PATHWAY REGIMEN - Other   [id number]:Atezolizumab 1,200 mg IV D1 + Bevacizumab 15 mg/kg IV D1 q21 Days:   A cycle is every 21 Days:     Atezolizumab      Bevacizumab-xxxx   **Always confirm dose/schedule in your pharmacy ordering system**  Patient Characteristics: Intent of Therapy: Non-Curative / Palliative Intent, Discussed with Patient 

## 2020-04-23 ENCOUNTER — Other Ambulatory Visit: Payer: Self-pay

## 2020-04-23 ENCOUNTER — Encounter: Payer: Self-pay | Admitting: *Deleted

## 2020-04-23 ENCOUNTER — Telehealth: Payer: Self-pay

## 2020-04-23 ENCOUNTER — Encounter: Payer: Self-pay | Admitting: Oncology

## 2020-04-23 ENCOUNTER — Telehealth (INDEPENDENT_AMBULATORY_CARE_PROVIDER_SITE_OTHER): Payer: Self-pay

## 2020-04-23 ENCOUNTER — Encounter: Payer: Self-pay | Admitting: Gastroenterology

## 2020-04-23 DIAGNOSIS — I8500 Esophageal varices without bleeding: Secondary | ICD-10-CM

## 2020-04-23 DIAGNOSIS — K7460 Unspecified cirrhosis of liver: Secondary | ICD-10-CM

## 2020-04-23 NOTE — Telephone Encounter (Signed)
Called patient and got patient scheduled for 05/09/2020 with Dr. Marius Ditch. Informed patient that COVID test would be 05/07/2020. Went over instructions and sent to [employer]

## 2020-04-23 NOTE — Telephone Encounter (Signed)
I attempted to contact the patient and was unable to speak with or leave a message as the voice mail box is not set up.

## 2020-04-23 NOTE — Telephone Encounter (Signed)
-----   Message from Lin Landsman, MD sent at 04/19/2020  1:17 PM EST ----- Regarding: Schedule EGD Please schedule EGD for variceal screening Diagnosis cirrhosis and HCC  RV

## 2020-04-24 ENCOUNTER — Encounter: Payer: Self-pay | Admitting: *Deleted

## 2020-04-24 NOTE — Telephone Encounter (Signed)
Spoke with Maxwell Edwards at the [facility] this morning and the patient is scheduled with Dr. Lucky Cowboy for a port placement opn 04/30/20 with a 11:00 am arrival time to the MM. Covid testing on 04/26/20 between 8-1 pm at the [facility]. Pre-procedure instructions will be mailed. Sherry at the [facility] will call the patient and his family to give them the information.

## 2020-04-25 ENCOUNTER — Inpatient Hospital Stay (HOSPITAL_BASED_OUTPATIENT_CLINIC_OR_DEPARTMENT_OTHER): Payer: 59 | Admitting: Hospice and Palliative Medicine

## 2020-04-25 DIAGNOSIS — C220 Liver cell carcinoma: Secondary | ICD-10-CM

## 2020-04-25 NOTE — Progress Notes (Signed)
Multidisciplinary Oncology Council Documentation  Maxwell Edwards was presented by our [facility] on 04/25/2020, which included representatives from:  . Palliative Care . Dietitian  . Physical/Occupational Therapist . Nurse Navigator . Genetics . Speech Therapist . Social work . Survivorship RN . Hotel manager . Research RN . [place] currently presents with history of [facility]  We reviewed previous medical and familial history, history of present illness, and recent lab results along with all available histopathologic and imaging studies. [facility] [place] considered available treatment options and made the following recommendations/referrals:  Recommend nutrition consult   The MOC is a meeting of clinicians from various specialty areas who evaluate and discuss patients for whom a multidisciplinary approach is being considered. Final determinations in the plan of care are those of the provider(s).   Today's extended care, comprehensive team conference, Maxwell Edwards was not present for the discussion and was not examined.

## 2020-04-26 ENCOUNTER — Encounter: Payer: Self-pay | Admitting: Oncology

## 2020-04-26 ENCOUNTER — Other Ambulatory Visit: Payer: Self-pay

## 2020-04-26 ENCOUNTER — Other Ambulatory Visit
Admission: RE | Admit: 2020-04-26 | Discharge: 2020-04-26 | Disposition: A | Discharge status: Home / Self Care | Payer: 59 | Source: Ambulatory Visit | Attending: Vascular Surgery | Admitting: Vascular Surgery

## 2020-04-26 ENCOUNTER — Encounter: Payer: Self-pay | Admitting: Gastroenterology

## 2020-04-26 DIAGNOSIS — Z20822 Contact with and (suspected) exposure to covid-19: Secondary | ICD-10-CM | POA: Diagnosis not present

## 2020-04-26 DIAGNOSIS — Z01812 Encounter for preprocedural laboratory examination: Secondary | ICD-10-CM | POA: Diagnosis present

## 2020-04-26 LAB — SARS CORONAVIRUS 2 (TAT 6-24 HRS): SARS Coronavirus 2: NEGATIVE

## 2020-04-26 NOTE — Patient Instructions (Signed)
Atezolizumab injection What is this medicine? ATEZOLIZUMAB (a te zoe LIZ ue mab) is a monoclonal antibody. It is used to treat bladder cancer (urothelial cancer), liver cancer, lung cancer, and melanoma. This medicine may be used for other purposes; ask your health care provider or pharmacist if you have questions. COMMON BRAND NAME(S): Tecentriq What should I tell my health care provider before I take this medicine? They need to know if you have any of these conditions:  autoimmune diseases like Crohn's disease, ulcerative colitis, or lupus  have had or planning to have an allogeneic stem cell transplant (uses someone else's stem cells)  history of organ transplant  history of radiation to the chest  nervous system problems like myasthenia gravis or Guillain-Barre syndrome  an unusual or allergic reaction to atezolizumab, other medicines, foods, dyes, or preservatives  pregnant or trying to get pregnant  breast-feeding How should I use this medicine? This medicine is for infusion into a vein. It is given by a health care professional in a hospital or clinic setting. A special MedGuide will be given to you before each treatment. Be sure to read this information carefully each time. Talk to your pediatrician regarding the use of this medicine in children. Special care may be needed. Overdosage: If you think you have taken too much of this medicine contact a poison control center or emergency room at once. NOTE: This medicine is only for you. Do not share this medicine with others. What if I miss a dose? It is important not to miss your dose. Call your doctor or health care professional if you are unable to keep an appointment. What may interact with this medicine? Interactions have not been studied. This list may not describe all possible interactions. Give your health care provider a list of all the medicines, herbs, non-prescription drugs, or dietary supplements you use. Also tell  them if you smoke, drink alcohol, or use illegal drugs. Some items may interact with your medicine. What should I watch for while using this medicine? Your condition will be monitored carefully while you are receiving this medicine. You may need blood work done while you are taking this medicine. Do not become pregnant while taking this medicine or for at least 5 months after stopping it. Women should inform their doctor if they wish to become pregnant or think they might be pregnant. There is a potential for serious side effects to an unborn child. Talk to your health care professional or pharmacist for more information. Do not breast-feed an infant while taking this medicine or for at least 5 months after the last dose. What side effects may I notice from receiving this medicine? Side effects that you should report to your doctor or health care professional as soon as possible:  allergic reactions like skin rash, itching or hives, swelling of the face, lips, or tongue  black, tarry stools  bloody or watery diarrhea  breathing problems  changes in vision  chest pain or chest tightness  chills  facial flushing  fever  headache  signs and symptoms of high blood sugar such as dizziness; dry mouth; dry skin; fruity breath; nausea; stomach pain; increased hunger or thirst; increased urination  signs and symptoms of liver injury like dark yellow or brown urine; general ill feeling or flu-like symptoms; light-colored stools; loss of appetite; nausea; right upper belly pain; unusually weak or tired; yellowing of the eyes or skin  stomach pain  trouble passing urine or change in the amount of   urine Side effects that usually do not require medical attention (report to your doctor or health care professional if they continue or are bothersome):  bone pain  cough  diarrhea  joint pain  muscle pain  muscle weakness  swelling of arms or legs  tiredness  weight loss This list  may not describe all possible side effects. Call your doctor for medical advice about side effects. You may report side effects to FDA at 1-800-FDA-1088. Where should I keep my medicine? This drug is given in a hospital or clinic and will not be stored at home. NOTE: This sheet is a summary. It may not cover all possible information. If you have questions about this medicine, talk to your doctor, pharmacist, or health care provider.  2021 Elsevier/Gold Standard (2019-11-24 13:59:34) Bevacizumab injection What is this medicine? BEVACIZUMAB (be va SIZ yoo mab) is a monoclonal antibody. It is used to treat many types of cancer. This medicine may be used for other purposes; ask your health care provider or pharmacist if you have questions. COMMON BRAND NAME(S): Avastin, MVASI, Zirabev What should I tell my health care provider before I take this medicine? They need to know if you have any of these conditions:  diabetes  heart disease  high blood pressure  history of coughing up blood  prior anthracycline chemotherapy (e.g., doxorubicin, daunorubicin, epirubicin)  recent or ongoing radiation therapy  recent or planning to have surgery  stroke  an unusual or allergic reaction to bevacizumab, hamster proteins, mouse proteins, other medicines, foods, dyes, or preservatives  pregnant or trying to get pregnant  breast-feeding How should I use this medicine? This medicine is for infusion into a vein. It is given by a health care professional in a hospital or clinic setting. Talk to your pediatrician regarding the use of this medicine in children. Special care may be needed. Overdosage: If you think you have taken too much of this medicine contact a poison control center or emergency room at once. NOTE: This medicine is only for you. Do not share this medicine with others. What if I miss a dose? It is important not to miss your dose. Call your doctor or health care professional if you  are unable to keep an appointment. What may interact with this medicine? Interactions are not expected. This list may not describe all possible interactions. Give your health care provider a list of all the medicines, herbs, non-prescription drugs, or dietary supplements you use. Also tell them if you smoke, drink alcohol, or use illegal drugs. Some items may interact with your medicine. What should I watch for while using this medicine? Your condition will be monitored carefully while you are receiving this medicine. You will need important blood work and urine testing done while you are taking this medicine. This medicine may increase your risk to bruise or bleed. Call your doctor or health care professional if you notice any unusual bleeding. Before having surgery, talk to your health care provider to make sure it is ok. This drug can increase the risk of poor healing of your surgical site or wound. You will need to stop this drug for 28 days before surgery. After surgery, wait at least 28 days before restarting this drug. Make sure the surgical site or wound is healed enough before restarting this drug. Talk to your health care provider if questions. Do not become pregnant while taking this medicine or for 6 months after stopping it. Women should inform their doctor if they wish   to become pregnant or think they might be pregnant. There is a potential for serious side effects to an unborn child. Talk to your health care professional or pharmacist for more information. Do not breast-feed an infant while taking this medicine and for 6 months after the last dose. This medicine has caused ovarian failure in some women. This medicine may interfere with the ability to have a child. You should talk to your doctor or health care professional if you are concerned about your fertility. What side effects may I notice from receiving this medicine? Side effects that you should report to your doctor or health care  professional as soon as possible:  allergic reactions like skin rash, itching or hives, swelling of the face, lips, or tongue  chest pain or chest tightness  chills  coughing up blood  high fever  seizures  severe constipation  signs and symptoms of bleeding such as bloody or black, tarry stools; red or dark-brown urine; spitting up blood or brown material that looks like coffee grounds; red spots on the skin; unusual bruising or bleeding from the eye, gums, or nose  signs and symptoms of a blood clot such as breathing problems; chest pain; severe, sudden headache; pain, swelling, warmth in the leg  signs and symptoms of a stroke like changes in vision; confusion; trouble speaking or understanding; severe headaches; sudden numbness or weakness of the face, arm or leg; trouble walking; dizziness; loss of balance or coordination  stomach pain  sweating  swelling of legs or ankles  vomiting  weight gain Side effects that usually do not require medical attention (report to your doctor or health care professional if they continue or are bothersome):  back pain  changes in taste  decreased appetite  dry skin  nausea  tiredness This list may not describe all possible side effects. Call your doctor for medical advice about side effects. You may report side effects to FDA at 1-800-FDA-1088. Where should I keep my medicine? This drug is given in a hospital or clinic and will not be stored at home. NOTE: This sheet is a summary. It may not cover all possible information. If you have questions about this medicine, talk to your doctor, pharmacist, or health care provider.  2021 Elsevier/Gold Standard (2018-12-22 10:50:46)  

## 2020-04-27 ENCOUNTER — Inpatient Hospital Stay: Payer: 59

## 2020-04-27 ENCOUNTER — Encounter: Payer: Self-pay | Admitting: Oncology

## 2020-04-27 ENCOUNTER — Encounter: Payer: Self-pay | Admitting: Gastroenterology

## 2020-04-29 ENCOUNTER — Encounter: Payer: Self-pay | Admitting: Oncology

## 2020-04-30 ENCOUNTER — Ambulatory Visit
Admission: RE | Admit: 2020-04-30 | Discharge: 2020-04-30 | Disposition: A | Discharge status: Home / Self Care | Payer: 59 | Attending: Vascular Surgery | Admitting: Vascular Surgery

## 2020-04-30 ENCOUNTER — Other Ambulatory Visit (INDEPENDENT_AMBULATORY_CARE_PROVIDER_SITE_OTHER): Payer: Self-pay | Admitting: Nurse Practitioner

## 2020-04-30 ENCOUNTER — Other Ambulatory Visit: Payer: Self-pay

## 2020-04-30 ENCOUNTER — Encounter: Payer: Self-pay | Admitting: Oncology

## 2020-04-30 ENCOUNTER — Encounter
Admission: RE | Disposition: A | Discharge status: Home / Self Care | Payer: Self-pay | Source: Home / Self Care | Attending: Vascular Surgery

## 2020-04-30 ENCOUNTER — Other Ambulatory Visit: Payer: Self-pay | Admitting: *Deleted

## 2020-04-30 ENCOUNTER — Encounter: Payer: Self-pay | Admitting: Vascular Surgery

## 2020-04-30 DIAGNOSIS — F17210 Nicotine dependence, cigarettes, uncomplicated: Secondary | ICD-10-CM | POA: Insufficient documentation

## 2020-04-30 DIAGNOSIS — C220 Liver cell carcinoma: Secondary | ICD-10-CM | POA: Insufficient documentation

## 2020-04-30 DIAGNOSIS — C9629 Other malignant mast cell neoplasm: Secondary | ICD-10-CM

## 2020-04-30 HISTORY — PX: PORTA CATH INSERTION: CATH118285

## 2020-04-30 SURGERY — PORTA CATH INSERTION
Anesthesia: Moderate Sedation

## 2020-04-30 MED ORDER — DIPHENHYDRAMINE HCL 50 MG/ML IJ SOLN: 50.0000 mg | Freq: Once | INTRAMUSCULAR | Status: DC | PRN

## 2020-04-30 MED ORDER — SODIUM CHLORIDE 0.9 % IV SOLN: INTRAVENOUS | Status: DC

## 2020-04-30 MED ORDER — CHLORHEXIDINE GLUCONATE CLOTH 2 % EX PADS: 6.0000 | MEDICATED_PAD | Freq: Every day | CUTANEOUS | Status: DC

## 2020-04-30 MED ORDER — ONDANSETRON HCL 4 MG/2ML IJ SOLN: 4.0000 mg | Freq: Four times a day (QID) | INTRAMUSCULAR | Status: DC | PRN

## 2020-04-30 MED ORDER — FENTANYL CITRATE (PF) 100 MCG/2ML IJ SOLN
INTRAMUSCULAR | Status: DC | PRN
  Administered 2020-04-30: 50 ug via INTRAVENOUS

## 2020-04-30 MED ORDER — FENTANYL CITRATE (PF) 100 MCG/2ML IJ SOLN
INTRAMUSCULAR | Status: AC
  Filled 2020-04-30: qty 2

## 2020-04-30 MED ORDER — MIDAZOLAM HCL 2 MG/2ML IJ SOLN
INTRAMUSCULAR | Status: DC | PRN
  Administered 2020-04-30: 2 mg via INTRAVENOUS

## 2020-04-30 MED ORDER — FAMOTIDINE 20 MG PO TABS: 40.0000 mg | ORAL_TABLET | Freq: Once | ORAL | Status: DC | PRN

## 2020-04-30 MED ORDER — METHYLPREDNISOLONE SODIUM SUCC 125 MG IJ SOLR: 125.0000 mg | Freq: Once | INTRAMUSCULAR | Status: DC | PRN

## 2020-04-30 MED ORDER — MIDAZOLAM HCL 2 MG/ML PO SYRP: 8.0000 mg | ORAL_SOLUTION | Freq: Once | ORAL | Status: DC | PRN

## 2020-04-30 MED ORDER — CEFAZOLIN SODIUM-DEXTROSE 2-4 GM/100ML-% IV SOLN
INTRAVENOUS | Status: AC
  Administered 2020-04-30: 2 g via INTRAVENOUS
  Filled 2020-04-30: qty 100

## 2020-04-30 MED ORDER — MIDAZOLAM HCL 5 MG/5ML IJ SOLN
INTRAMUSCULAR | Status: AC
  Filled 2020-04-30: qty 5

## 2020-04-30 MED ORDER — MIRTAZAPINE 7.5 MG PO TABS: 7.5000 mg | ORAL_TABLET | Freq: Every day | ORAL | 0 refills | Status: DC

## 2020-04-30 MED ORDER — CEFAZOLIN SODIUM-DEXTROSE 2-4 GM/100ML-% IV SOLN: 2.0000 g | Freq: Once | INTRAVENOUS | Status: AC

## 2020-04-30 MED ORDER — SODIUM CHLORIDE 0.9 % IV SOLN
Freq: Once | INTRAVENOUS | Status: DC
  Filled 2020-04-30: qty 2

## 2020-04-30 MED ORDER — HYDROMORPHONE HCL 1 MG/ML IJ SOLN
1.0000 mg | Freq: Once | INTRAMUSCULAR | Status: DC | PRN
Start: 2020-04-30 — End: 2020-04-30

## 2020-04-30 SURGICAL SUPPLY — 11 items
ADH SKN CLS APL DERMABOND .7 (GAUZE/BANDAGES/DRESSINGS) ×1
COVER PROBE U/S 5X48 (MISCELLANEOUS) ×2 IMPLANT
DERMABOND ADVANCED (GAUZE/BANDAGES/DRESSINGS) ×1
DERMABOND ADVANCED .7 DNX12 (GAUZE/BANDAGES/DRESSINGS) ×1 IMPLANT
KIT PORT POWER 8FR ISP CVUE (Port) ×2 IMPLANT
PACK ANGIOGRAPHY (CUSTOM PROCEDURE TRAY) ×2 IMPLANT
SPONGE XRAY 4X4 16PLY STRL (MISCELLANEOUS) ×2 IMPLANT
SUT MNCRL AB 4-0 PS2 18 (SUTURE) ×2 IMPLANT
SUT VIC AB 3-0 SH 27 (SUTURE) ×2
SUT VIC AB 3-0 SH 27X BRD (SUTURE) ×1 IMPLANT
TOWEL OR 17X26 4PK STRL BLUE (TOWEL DISPOSABLE) ×2 IMPLANT

## 2020-04-30 NOTE — Interval H&P Note (Signed)
History and Physical Interval Note:  04/30/2020 11:11 AM  Maxwell Edwards  has presented today for surgery, with the diagnosis of Port Placement   Hepatocellular Cancer   Pt to have Covid on 2-17.  The various methods of treatment have been discussed with the patient and family. After consideration of risks, benefits and other options for treatment, the patient has consented to  Procedure(s): PORTA CATH INSERTION (N/A) as a surgical intervention.  The patient's history has been reviewed, patient examined, no change in status, stable for surgery.  I have reviewed the patient's chart and labs.  Questions were answered to the patient's satisfaction.     Leotis Pain

## 2020-04-30 NOTE — Op Note (Signed)
      [facility]       Operative Note  Date: 04/30/2020  Preoperative diagnosis:  1. Hepatocellular cancer  Postoperative diagnosis:  Same as above  Procedures: #1. Ultrasound guidance for vascular access to the right internal jugular vein. #2. Fluoroscopic guidance for placement of catheter. #3. Placement of CT compatible Port-A-Cath, right internal jugular vein.  Surgeon: Leotis Pain, MD.   Anesthesia: Local with moderate conscious sedation for approximately 22  minutes using 2 mg of Versed and 50 mcg of Fentanyl  Fluoroscopy time: less than 1 minute  Contrast used: 0  Estimated blood loss: 3 cc  Indication for the procedure:  The patient is a 65 y.o.male with hepatocellular cancer.  The patient needs a Port-A-Cath for durable venous access, chemotherapy, lab draws, and CT scans. We are asked to place this. Risks and benefits were discussed and informed consent was obtained.  Description of procedure: The patient was brought to the vascular and interventional radiology suite.  Moderate conscious sedation was administered throughout the procedure during a face to face encounter with the patient with my supervision of the RN administering medicines and monitoring the patient's vital signs, pulse oximetry, telemetry and mental status throughout from the start of the procedure until the patient was taken to the recovery room. The right neck chest and shoulder were sterilely prepped and draped, and a sterile surgical field was created. Ultrasound was used to help visualize a patent right internal jugular vein. This was then accessed under direct ultrasound guidance without difficulty with the Seldinger needle and a permanent image was recorded. A J-wire was placed. After skin nick and dilatation, the peel-away sheath was then placed over the wire. I then anesthetized an area under the clavicle approximately 1-2 fingerbreadths. A transverse incision was created and an  inferior pocket was created with electrocautery and blunt dissection. The port was then brought onto the field, placed into the pocket and secured to the chest wall with 2 Prolene sutures. The catheter was connected to the port and tunneled from the subclavicular incision to the access site. Fluoroscopic guidance was then used to cut the catheter to an appropriate length. The catheter was then placed through the peel-away sheath and the peel-away sheath was removed. The catheter tip was parked in excellent location under fluorocoscopic guidance in the cavoatrial junction. The pocket was then irrigated with antibiotic impregnated saline and the wound was closed with a running 3-0 Vicryl and a 4-0 Monocryl. The access incision was closed with a single 4-0 Monocryl. The Huber needle was used to withdraw blood and flush the port with heparinized saline. Dermabond was then placed as a dressing. The patient tolerated the procedure well and was taken to the recovery room in stable condition.   Leotis Pain 04/30/2020 1:25 PM   This note was created with Dragon Medical transcription system. Any errors in dictation are purely unintentional.

## 2020-05-01 ENCOUNTER — Other Ambulatory Visit: Payer: Self-pay | Admitting: Oncology

## 2020-05-01 ENCOUNTER — Other Ambulatory Visit: Payer: Self-pay

## 2020-05-01 NOTE — Telephone Encounter (Signed)
Spoke with deborah and patient let them know about appt tomorrow at 10:30 am for labs and Smc. [facility]

## 2020-05-02 ENCOUNTER — Encounter: Payer: Self-pay | Admitting: Oncology

## 2020-05-02 ENCOUNTER — Inpatient Hospital Stay: Payer: 59

## 2020-05-02 ENCOUNTER — Inpatient Hospital Stay (HOSPITAL_BASED_OUTPATIENT_CLINIC_OR_DEPARTMENT_OTHER): Payer: 59 | Admitting: Oncology

## 2020-05-02 ENCOUNTER — Other Ambulatory Visit: Payer: Self-pay

## 2020-05-02 VITALS — BP 116/76 | HR 98 | Temp 99.9°F | Resp 21 | Wt 157.2 lb

## 2020-05-02 DIAGNOSIS — C220 Liver cell carcinoma: Secondary | ICD-10-CM | POA: Diagnosis not present

## 2020-05-02 DIAGNOSIS — R630 Anorexia: Secondary | ICD-10-CM | POA: Diagnosis not present

## 2020-05-02 DIAGNOSIS — E860 Dehydration: Secondary | ICD-10-CM

## 2020-05-02 LAB — CBC WITH DIFFERENTIAL/PLATELET
Abs Immature Granulocytes: 0.06 10*3/uL (ref 0.00–0.07)
Basophils Absolute: 0 10*3/uL (ref 0.0–0.1)
Basophils Relative: 0 %
Eosinophils Absolute: 0 10*3/uL (ref 0.0–0.5)
Eosinophils Relative: 0 %
HCT: 45.3 % (ref 39.0–52.0)
Hemoglobin: 15.1 g/dL (ref 13.0–17.0)
Immature Granulocytes: 1 %
Lymphocytes Relative: 16 %
Lymphs Abs: 1.7 10*3/uL (ref 0.7–4.0)
MCH: 32.1 pg (ref 26.0–34.0)
MCHC: 33.3 g/dL (ref 30.0–36.0)
MCV: 96.2 fL (ref 80.0–100.0)
Monocytes Absolute: 1.2 10*3/uL — ABNORMAL HIGH (ref 0.1–1.0)
Monocytes Relative: 11 %
Neutro Abs: 7.5 10*3/uL (ref 1.7–7.7)
Neutrophils Relative %: 72 %
Platelets: 223 10*3/uL (ref 150–400)
RBC: 4.71 MIL/uL (ref 4.22–5.81)
RDW: 14.5 % (ref 11.5–15.5)
WBC: 10.4 10*3/uL (ref 4.0–10.5)
nRBC: 0 % (ref 0.0–0.2)

## 2020-05-02 LAB — COMPREHENSIVE METABOLIC PANEL
ALT: 151 U/L — ABNORMAL HIGH (ref 0–44)
AST: 148 U/L — ABNORMAL HIGH (ref 15–41)
Albumin: 3 g/dL — ABNORMAL LOW (ref 3.5–5.0)
Alkaline Phosphatase: 397 U/L — ABNORMAL HIGH (ref 38–126)
Anion gap: 10 (ref 5–15)
BUN: 17 mg/dL (ref 8–23)
CO2: 26 mmol/L (ref 22–32)
Calcium: 9.1 mg/dL (ref 8.9–10.3)
Chloride: 96 mmol/L — ABNORMAL LOW (ref 98–111)
Creatinine, Ser: 0.73 mg/dL (ref 0.61–1.24)
GFR, Estimated: >60 mL/min (ref >60)
Glucose, Bld: 126 mg/dL — ABNORMAL HIGH (ref 70–99)
Potassium: 3.7 mmol/L (ref 3.5–5.1)
Sodium: 132 mmol/L — ABNORMAL LOW (ref 135–145)
Total Bilirubin: 1.7 mg/dL — ABNORMAL HIGH (ref 0.3–1.2)
Total Protein: 8.1 g/dL (ref 6.5–8.1)

## 2020-05-02 MED ORDER — DEXAMETHASONE SODIUM PHOSPHATE 10 MG/ML IJ SOLN: 10.0000 mg | Freq: Once | INTRAMUSCULAR | Status: DC

## 2020-05-02 MED ORDER — SODIUM CHLORIDE 0.9% FLUSH
10.0000 mL | Freq: Once | INTRAVENOUS | Status: AC
  Administered 2020-05-02: 10 mL via INTRAVENOUS
  Filled 2020-05-02: qty 10

## 2020-05-02 MED ORDER — DEXAMETHASONE SODIUM PHOSPHATE 10 MG/ML IJ SOLN
10.0000 mg | Freq: Once | INTRAMUSCULAR | Status: AC
  Administered 2020-05-02: 10 mg via INTRAVENOUS
  Filled 2020-05-02: qty 1

## 2020-05-02 MED ORDER — SODIUM CHLORIDE 0.9 % IV SOLN: 10.0000 mg | Freq: Once | INTRAVENOUS | Status: DC

## 2020-05-02 MED ORDER — HEPARIN SOD (PORK) LOCK FLUSH 100 UNIT/ML IV SOLN
500.0000 [IU] | Freq: Once | INTRAVENOUS | Status: AC
  Administered 2020-05-02: 500 [IU] via INTRAVENOUS
  Filled 2020-05-02: qty 5

## 2020-05-02 MED ORDER — SODIUM CHLORIDE 0.9 % IV SOLN
INTRAVENOUS | Status: DC
  Filled 2020-05-02 (×2): qty 250

## 2020-05-02 NOTE — Progress Notes (Signed)
Symptom Management Consult note [facility]  [phone] [phone]  Patient Care Team: Venita Lick, NP as PCP - General (Nurse Practitioner) Clent Jacks, RN as Oncology Nurse Navigator   Name of the patient: Maxwell Edwards  [id number]  Jan 26, 1956   Date of visit: 05/02/2020   Diagnosis-locally advanced hepatocellular carcinoma.  Chief complaint/ Reason for visit-anorexia/dehydration  Heme/Onc history: Mr. Crill is a 65 year old male with past medical history significant for hypertension, hyperlipidemia, CAD status post CABG who presented to his PCP with symptoms of ongoing weight loss.  Work-up included imaging which was completed on 04/06/2020 which showed liver lesions with several pulmonary nodules.  Labs showed elevated AST and ALT.  MRI showed a mass measuring 7.9 x 6.9 x 6.9 cm.  Alpha fetoprotein was elevated at 38,000 and CA 19-9 was 147.  Patient had port placed by Dr. Lucky Cowboy on 04/30/2020.  Plan is to begin chemo immunotherapy with Avastin and Tecentriq on 05/04/20.   Interval history-today he presents to symptom management for follow-up and labs.  Patient's family is very concerned that he is not eating or drinking well.  He continues to lose weight.  He tolerated port placement well on Monday.  Daughter states he gets fatigued from walking from one into the house to the other.  This is a significant change from his baseline 2 weeks ago.  He has very little appetite.  The thought of food makes him nauseous.  He is able to drink 2 boost daily.  Finger foods every 2 hours.  Feels "lousy".  Denies any pain.  Having good bowel movements.  Denies any urinary concerns.  ECOG FS:1 - Symptomatic but completely ambulatory  Review of systems- Review of Systems  Constitutional: Positive for malaise/fatigue and weight loss. Negative for chills and fever.  HENT: Negative for congestion, ear pain and tinnitus.   Eyes: Negative.   Negative for blurred vision and double vision.  Respiratory: Negative.  Negative for cough, sputum production and shortness of breath.   Cardiovascular: Negative.  Negative for chest pain, palpitations and leg swelling.  Gastrointestinal: Negative.  Negative for abdominal pain, constipation, diarrhea, nausea and vomiting.  Genitourinary: Negative for dysuria, frequency and urgency.  Musculoskeletal: Negative for back pain and falls.  Skin: Negative.  Negative for rash.  Neurological: Positive for dizziness and weakness. Negative for headaches.  Endo/Heme/Allergies: Negative.  Does not bruise/bleed easily.  Psychiatric/Behavioral: Negative.  Negative for depression. The patient is not nervous/anxious and does not have insomnia.      Current treatment-starting treatment with Avastin and Tecentriq on 05/04/2020  No Known Allergies   Past Medical History:  Diagnosis Date  . CAD (coronary artery disease)   . Heart disease      Past Surgical History:  Procedure Laterality Date  . CORONARY ARTERY BYPASS GRAFT     at 65 years old  . PORTA CATH INSERTION N/A 04/30/2020   Procedure: PORTA CATH INSERTION;  Surgeon: Algernon Huxley, MD;  Location: [place] [facility];  Service: Cardiovascular;  Laterality: N/A;  . VEIN BYPASS SURGERY      Social History   Socioeconomic History  . Marital status: Divorced    Spouse name: Not on file  . Number of children: 2  . Years of education: Not on file  . Highest education level: Not on file  Occupational History  . 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[employer]  . Smoking status:  Current Every Day Smoker    Packs/day: 0.50    Types: Cigarettes  . Smokeless tobacco: Never Used  Vaping Use  . Vaping Use: Never used  Substance and Sexual Activity  . Alcohol use: Not Currently  . Drug use: Not Currently  . Sexual activity: Not Currently  Other Topics Concern  . Not on file  Social History Narrative   Lives by himself     Social Determinants of Health   Financial Resource Strain: Not on file  Food Insecurity: Not on file  Transportation Needs: Not on file  Physical Activity: Not on file  Stress: Not on file  Social Connections: Not on file  Intimate Partner Violence: Not on file    Family History  Problem Relation Age of Onset  . Dementia Mother   . Ulcers Father   . Heart attack Brother   . Throat cancer Brother   . Heart attack Daughter   . Diabetes Daughter   . Depression Daughter      Current Outpatient Medications:  .  fluticasone (FLONASE) 50 MCG/ACT nasal spray, Place 1 spray into both nostrils daily., Disp: , Rfl:  .  lidocaine-prilocaine (EMLA) cream, Apply to affected area once, Disp: 30 g, Rfl: 3 .  mirtazapine (REMERON) 7.5 MG tablet, Take 1 tablet (7.5 mg total) by mouth at bedtime., Disp: 30 tablet, Rfl: 0 .  ondansetron (ZOFRAN) 8 MG tablet, Take 1 tablet (8 mg total) by mouth 2 (two) times daily as needed (Nausea or vomiting)., Disp: 30 tablet, Rfl: 1 .  prochlorperazine (COMPAZINE) 10 MG tablet, Take 1 tablet (10 mg total) by mouth every 6 (six) hours as needed (Nausea or vomiting)., Disp: 30 tablet, Rfl: 1  Current Facility-Administered Medications:  .  0.9 %  sodium chloride infusion, , Intravenous, Continuous, Delrick Dehart, Wandra Feinstein, NP, Last Rate: 999 mL/hr at 05/02/20 1140, New Bag at 05/02/20 1140  Physical exam:  Vitals:   05/02/20 1052  BP: 116/76  Pulse: 98  Resp: (!) 21  Temp: 99.9 F (37.7 C)  TempSrc: Tympanic  SpO2: 100%  Weight: 157 lb 3 oz (71.3 kg)   Physical Exam Constitutional:      General: Vital signs are normal.     Appearance: Normal appearance. He is ill-appearing.  HENT:     Head: Normocephalic and atraumatic.  Eyes:     General: Scleral icterus present.     Pupils: Pupils are equal, round, and reactive to light.  Cardiovascular:     Rate and Rhythm: Normal rate and regular rhythm.     Heart sounds: Normal heart sounds. No murmur  heard.   Pulmonary:     Effort: Pulmonary effort is normal.     Breath sounds: Normal breath sounds. No wheezing.  Abdominal:     General: Bowel sounds are normal. There is no distension.     Palpations: Abdomen is soft.     Tenderness: There is no abdominal tenderness.  Musculoskeletal:        General: No edema. Normal range of motion.     Cervical back: Normal range of motion.  Skin:    General: Skin is warm and dry.     Findings: No rash.  Neurological:     Mental Status: He is alert and oriented to person, place, and time.  Psychiatric:        Judgment: Judgment normal.      CMP Latest Ref Rng & Units 05/02/2020  Glucose 70 - 99 mg/dL 126(H)  BUN  8 - 23 mg/dL 17  Creatinine 0.61 - 1.24 mg/dL 0.73  Sodium 135 - 145 mmol/L 132(L)  Potassium 3.5 - 5.1 mmol/L 3.7  Chloride 98 - 111 mmol/L 96(L)  CO2 22 - 32 mmol/L 26  Calcium 8.9 - 10.3 mg/dL 9.1  Total Protein 6.5 - 8.1 g/dL 8.1  Total Bilirubin 0.3 - 1.2 mg/dL 1.7(H)  Alkaline Phos 38 - 126 U/L 397(H)  AST 15 - 41 U/L 148(H)  ALT 0 - 44 U/L 151(H)   CBC Latest Ref Rng & Units 05/02/2020  WBC 4.0 - 10.5 K/uL 10.4  Hemoglobin 13.0 - 17.0 g/dL 15.1  Hematocrit 39.0 - 52.0 % 45.3  Platelets 150 - 400 K/uL 223    No images are attached to the encounter.  CT Chest Wo Contrast  Result Date: 04/06/2020 CLINICAL DATA:  Unintended weight loss.  Smoking history. EXAM: CT CHEST WITHOUT CONTRAST TECHNIQUE: Multidetector CT imaging of the chest was performed following the standard protocol without IV contrast. COMPARISON:  None. FINDINGS: Cardiovascular: The heart size is normal. No substantial pericardial effusion. Coronary artery calcification is evident. Atherosclerotic calcification is noted in the wall of the thoracic aorta. Mediastinum/Nodes: No mediastinal lymphadenopathy. No evidence for gross hilar lymphadenopathy although assessment is limited by the lack of intravenous contrast on today's study. The esophagus has normal  imaging features. There is no axillary lymphadenopathy. Lungs/Pleura: Centrilobular emphsyema noted. 4 mm right middle lobe nodule identified on image 84/2 with subtle right middle lobe tree-in-bud nodularity seen on image 86/2. 4 mm medial right upper lobe nodule identified on image 19/series 2. Possible central 8 mm right middle lobe nodule on axial 81/2 and coronal 59/5. 3 mm left upper lobe nodule identified on 69/2. No overtly suspicious nodule or mass. No focal airspace consolidation. No pleural effusion. Upper Abdomen: Diffusely nodular hepatic contour is compatible with cirrhosis. 8.3 x 7.0 cm ill-defined hypoattenuating lesion is identified in the left liver. 3.3 cm lesion is noted in the dome of the liver near the junction of the right and left hepatic lobes (103/3). Upper normal lymph nodes are seen in the gastrohepatic and hepatoduodenal ligaments. Recanalization of the paraumbilical vein suggests portal venous hypertension. Musculoskeletal: No worrisome lytic or sclerotic osseous abnormality. IMPRESSION: 1. Cirrhotic liver morphology with 8.3 x 7.0 cm ill-defined hypoattenuating lesion in the left liver with a 3.3 cm lesion in the dome of the liver. Given the apparent cirrhosis, imaging features are highly concerning for hepatocellular carcinoma. MRI of the abdomen without and with contrast recommended to further evaluate. 2. Scattered bilateral pulmonary nodules measuring up to 8 mm. Close follow-up recommended as metastatic disease not excluded. 3. Recanalization of the paraumbilical vein suggests portal venous hypertension. 4. Aortic Atherosclerosis (ICD10-I70.0) and Emphysema (ICD10-J43.9). These results will be called to the ordering clinician or representative by the Radiologist Assistant, and communication documented in the PACS or [employer]. Electronically Signed   By: Misty Stanley M.D.   On: 04/06/2020 14:48   MR Abdomen W Wo Contrast  Result Date: 04/13/2020 CLINICAL DATA:  Liver  mass is seen on chest CT, for further characterization. Cirrhosis. EXAM: MRI ABDOMEN WITHOUT AND WITH CONTRAST TECHNIQUE: Multiplanar multisequence MR imaging of the abdomen was performed both before and after the administration of intravenous contrast. CONTRAST:  39mL GADAVIST GADOBUTROL 1 MMOL/ML IV SOLN COMPARISON:  04/06/2020 FINDINGS: Lower chest: Median sternotomy. Hepatobiliary: Background hemochromatosis involving the liver with low T1 and T2 signal and signal dropout on in-phase imaging. Nodular hepatic contour compatible cirrhosis.  Infiltrating 7.9 by 6.9 by 6.9 cm mass in the lateral segment left hepatic lobe with associated extensor of enhancing tumor thrombus in the left portal vein as well as numerous additional multifocal enhancing tumors throughout the left hepatic lobe and some in the right hepatic lobe. Another mass in segment 4a of the liver measures 3.4 by 2.9 cm on image 4 of series 6. These masses demonstrate mostly low-grade enhancement including low-grade arterial phase enhancement. Many of these continue to demonstrate delayed enhancement without a characteristic washout or capsule appearance. Pancreas:  Unremarkable Spleen: The spleen measures 13.8 by 6.6 by 8.8 cm (volume = 420 cm^3). Adrenals/Urinary Tract:  Unremarkable Stomach/Bowel: Unremarkable Vascular/Lymphatic: Aortoiliac atherosclerotic vascular disease. Small porta hepatis lymph nodes are not pathologically enlarged. Other:  No supplemental non-categorized findings. Musculoskeletal: Unremarkable IMPRESSION: 1. Infiltrating 7.9 by 6.9 by 6.9 cm mass in the lateral segment left hepatic lobe with associated enhancing tumor thrombus filling the left portal vein as well as numerous additional multifocal enhancing tumors throughout the left hepatic lobe and some in the right hepatic lobe. Although otherwise suspicious for multifocal hepatocellular carcinoma, the enhancement pattern is not otherwise specific, with only low-grade arterial  phase enhancement and persistent delayed phase enhancement which would be somewhat atypical for [facility]. Accordingly, the possibility of cholangiocarcinoma or metastatic disease cannot be readily excluded and tissue diagnosis might be appropriate. Consider multidisciplinary cancer conference referral for further discussion. 2. Background hemochromatosis involving the liver. Borderline splenomegaly. 3.  Aortic Atherosclerosis (ICD10-I70.0). Electronically Signed   By: Van Clines M.D.   On: 04/13/2020 14:00   PERIPHERAL VASCULAR CATHETERIZATION  Result Date: 04/30/2020 See op note    Assessment and plan- Patient is a 65 y.o. male who presents to [facility] for follow-up, lab work and determine hydration status.  Hepatocellular carcinoma stage IV: -Presented with weight loss x2 to 3 months -Subsequent CT scan (04/06/20) showed liver lesion and several pulmonary nodules.  -MRI confirmed diagnosis.  No biopsy was obtained. -He is scheduled to begin treatment with Avastin plus Tecentriq on 05/04/2020 under the care of Dr. Janese Banks.  Poor oral intake/anorexia: -Symptoms have been present for several weeks now. -Patient has persistent weight loss. -He was started on mirtazapine on 04/30/2020.  -Labs from today 05/02/2020 show sodium of 132, alkaline phosphatase 397 and transaminitis (AST 148 and ALT 151).  Total bili 1.7.  CBC essentially unremarkable. -Plan to give 1 L of normal saline today while in clinic along with 10 mg dexamethasone.  Transaminitis: -Secondary to hepatocellular carcinoma. -Likely will improve once he begins treatment. -We will continue to monitor.  Disposition: -RTC as scheduled on 05/04/2020 for lab work and cycle 1 of Avastin plus Tecentriq.  Visit Diagnosis 1. Cancer, hepatocellular ([place])   2. Dehydration   3. Anorexia     Patient expressed understanding and was in agreement with this plan. He also understands that He can call clinic at any time with any questions, concerns,  or complaints.   Greater than 50% was spent in counseling and coordination of care with this patient including but not limited to discussion of the relevant topics above (See A&P) including, but not limited to diagnosis and management of acute and chronic medical conditions.   Thank you for allowing me to participate in the care of this very pleasant patient.    Jacquelin Hawking, NP [place] [facility] Cell - [phone] Pager- [phone] 05/02/2020 12:49 PM  CC: Dr. Janese Banks

## 2020-05-03 ENCOUNTER — Encounter: Payer: Self-pay | Admitting: *Deleted

## 2020-05-04 ENCOUNTER — Inpatient Hospital Stay: Payer: 59

## 2020-05-04 ENCOUNTER — Other Ambulatory Visit: Payer: Self-pay | Admitting: Oncology

## 2020-05-04 ENCOUNTER — Inpatient Hospital Stay: Payer: 59 | Admitting: Oncology

## 2020-05-04 ENCOUNTER — Telehealth: Payer: Self-pay | Admitting: *Deleted

## 2020-05-04 NOTE — Telephone Encounter (Signed)
I called Neoma Laming and told her that we spoke to Dr. Janese Banks about the concerns of so many things the pt has to do next week. The most important thing is getting him in chemo. So she asked me to call Dr. Marius Ditch and ask her to change the appts. For EGD. Dr. Marius Ditch sent me a message to send her an inbasket to her and her CMA to changed the appts. . So for now just come for tues to see dr Janese Banks and Thursday for labs and chemo and she will let pt know

## 2020-05-06 ENCOUNTER — Encounter: Payer: Self-pay | Admitting: Oncology

## 2020-05-07 ENCOUNTER — Other Ambulatory Visit: Admission: RE | Admit: 2020-05-07 | Payer: 59 | Source: Ambulatory Visit

## 2020-05-07 ENCOUNTER — Telehealth: Payer: Self-pay

## 2020-05-07 ENCOUNTER — Other Ambulatory Visit: Payer: Self-pay

## 2020-05-07 MED ORDER — OLANZAPINE 10 MG PO TABS: 10.0000 mg | ORAL_TABLET | Freq: Every day | ORAL | 1 refills | Status: DC

## 2020-05-07 NOTE — Telephone Encounter (Signed)
Tried to call patient but patient voicemail was not set up. Moved patient to 05/21/2020 updated referral and called ENDO and talk to jessie. Sent patient a Therapist, music

## 2020-05-07 NOTE — Telephone Encounter (Signed)
-----   Message from Lin Landsman, MD sent at 05/05/2020  1:03 PM EST ----- [name]  Please move his EGD a week later  Thanks RV ----- Message ----- From: Luella Cook, RN Sent: 05/04/2020   4:26 PM EST To: Lin Landsman, MD, Shelby Mattocks, CMA  Dr. Marius Ditch,    Our pt has to get started on chemo and he is coming 3/1 to see Dr. Janese Banks, then on 3/3 and it is a 3 hour treatment. The ex wife who takes care of him says that he can't do this many things in a week and aske Dr. Janese Banks if we could change the EGD to the next week or two. Is that possible for you to do. he needs to start chemo and we had to push it forward from 2/25 to 3/3 because of insurance denial. please let me know. thanks sherry- nurse for dr Elsie Amis told me to send you the message in inbasket and you would look into getting it rescheduled.

## 2020-05-08 ENCOUNTER — Inpatient Hospital Stay: Payer: 59 | Attending: Oncology | Admitting: Oncology

## 2020-05-08 ENCOUNTER — Inpatient Hospital Stay: Payer: 59

## 2020-05-08 ENCOUNTER — Encounter: Payer: Self-pay | Admitting: Oncology

## 2020-05-08 VITALS — BP 131/78 | HR 92 | Temp 98.8°F | Resp 16 | Wt 151.2 lb

## 2020-05-08 DIAGNOSIS — Z7189 Other specified counseling: Secondary | ICD-10-CM | POA: Diagnosis not present

## 2020-05-08 DIAGNOSIS — Z79899 Other long term (current) drug therapy: Secondary | ICD-10-CM | POA: Insufficient documentation

## 2020-05-08 DIAGNOSIS — R634 Abnormal weight loss: Secondary | ICD-10-CM | POA: Diagnosis not present

## 2020-05-08 DIAGNOSIS — Z5112 Encounter for antineoplastic immunotherapy: Secondary | ICD-10-CM | POA: Diagnosis not present

## 2020-05-08 DIAGNOSIS — C220 Liver cell carcinoma: Secondary | ICD-10-CM

## 2020-05-08 DIAGNOSIS — R110 Nausea: Secondary | ICD-10-CM | POA: Insufficient documentation

## 2020-05-08 DIAGNOSIS — R945 Abnormal results of liver function studies: Secondary | ICD-10-CM | POA: Diagnosis not present

## 2020-05-08 DIAGNOSIS — R7989 Other specified abnormal findings of blood chemistry: Secondary | ICD-10-CM

## 2020-05-08 DIAGNOSIS — E860 Dehydration: Secondary | ICD-10-CM

## 2020-05-08 MED ORDER — SODIUM CHLORIDE 0.9 % IV SOLN
Freq: Once | INTRAVENOUS | Status: AC
  Filled 2020-05-08: qty 250

## 2020-05-08 MED ORDER — SODIUM CHLORIDE 0.9% FLUSH
10.0000 mL | Freq: Once | INTRAVENOUS | Status: AC
  Administered 2020-05-08: 10 mL via INTRAVENOUS
  Filled 2020-05-08: qty 10

## 2020-05-08 MED ORDER — HEPARIN SOD (PORK) LOCK FLUSH 100 UNIT/ML IV SOLN
500.0000 [IU] | Freq: Once | INTRAVENOUS | Status: AC
  Administered 2020-05-08: 500 [IU] via INTRAVENOUS
  Filled 2020-05-08: qty 5

## 2020-05-08 NOTE — Progress Notes (Signed)
Hematology/Oncology Consult note [facility]  [phone] [phone]  Patient Care Team: Venita Lick, NP as PCP - General (Nurse Practitioner) Clent Jacks, RN as Oncology Nurse Navigator   Name of the patient: Maxwell Edwards  [id number]  12/09/1955   Date of visit: 05/08/20  Diagnosis- multifocal locally advanced hepatocellular carcinoma  Chief complaint/ Reason for visit-on treatment assessment prior to cycle 1 of Tecentriq and Avastin  Heme/Onc history: patient is a 65 year old male with a past medical history significant for hypertension hyperlipidemia, 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 Reports that he has lost about 20 pounds over the last 2 to 3 months. He is a chronic smoker which prompted a CT chest. CT chest without contrast on 04/06/2020 showed no evidence of mediastinal or hilar adenopathy. He was found to have 4 subcentimeter lung nodules 3 in the right lobe and one in the left upper lobe which were borderline for metastatic disease. Cirrhotic liver morphology with 8.3 x 7 cm ill-defined hypoattenuating lesion in the left liver with a 3.3 cm lesion in the dome of the liver near the junction of the right and left hepatic lobes. Labs showed elevated AST and ALT of 62 and 47 respectively with a normal albumin and normal total bilirubin. CMP showed elevated H&H of 18.3/53.8. Hepatitis C and HIV testing was negative.   MRI showed multifocal HCC with the largest mass measuring 7.9 x 6.9 x 6.9 cm in the lateral segment of the left hepatic lobe associated with tumor thrombus in the left portal vein.  Another mass in the segment 4A measuring 3.4 x 2.9 cm.  No evidence of local regional adenopathy.  Alpha-fetoprotein elevated at 38,000.  CA 19-9 elevated at 147.Patient's case discussed at tumor board and consensus was that this would be consistent with [facility] especially given the AFP  value.  Initially MRI was not characteristic for [facility] but a biopsy was not deemed necessary after AFP results were back   Interval history-since my last visit with him about 3 weeks ago patient has been doing poorly.  He has had poor appetite and significant nausea and some bouts of vomiting during the day.  He was also seen by symptom management on 05/02/2020 for similar complaints.He has been receiving IV fluids in a periodic basis.  He just started taking Zyprexa since 1 day.  He denies any significant abdominal pain at this time.  He consumes about 2 ensures per day and mostly eats fruits.  Reports early satiety  ECOG PS- 1-2 Pain scale- 0 Opioid associated constipation- no  Review of systems- Review of Systems  Constitutional: Positive for malaise/fatigue and weight loss. Negative for chills and fever.       Early satiety  HENT: Negative for congestion, ear discharge and nosebleeds.   Eyes: Negative for blurred vision.  Respiratory: Negative for cough, hemoptysis, sputum production, shortness of breath and wheezing.   Cardiovascular: Negative for chest pain, palpitations, orthopnea and claudication.  Gastrointestinal: Negative for abdominal pain, blood in stool, constipation, diarrhea, heartburn, melena, nausea and vomiting.  Genitourinary: Negative for dysuria, flank pain, frequency, hematuria and urgency.  Musculoskeletal: Negative for back pain, joint pain and myalgias.  Skin: Negative for rash.  Neurological: Negative for dizziness, tingling, focal weakness, seizures, weakness and headaches.  Endo/Heme/Allergies: Does not bruise/bleed easily.  Psychiatric/Behavioral: Negative for depression and suicidal ideas. The patient does not have insomnia.  No Known Allergies   Past Medical History:  Diagnosis Date  . CAD (coronary artery disease)   . Heart disease      Past Surgical History:  Procedure Laterality Date  . CORONARY ARTERY BYPASS GRAFT     at 65 years old  .  PORTA CATH INSERTION N/A 04/30/2020   Procedure: PORTA CATH INSERTION;  Surgeon: Algernon Huxley, MD;  Location: [place] [facility];  Service: Cardiovascular;  Laterality: N/A;  . VEIN BYPASS SURGERY      Social History   Socioeconomic History  . Marital status: Divorced    Spouse name: Not on file  . Number of children: 2  . Years of education: Not on file  . Highest education level: Not on file  Occupational History  . 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[employer]  . Smoking status: Current Every Day Smoker    Packs/day: 0.50    Types: Cigarettes  . Smokeless tobacco: Never Used  Vaping Use  . Vaping Use: Never used  Substance and Sexual Activity  . Alcohol use: Not Currently  . Drug use: Not Currently  . Sexual activity: Not Currently  Other Topics Concern  . Not on file  Social History Narrative   Lives by himself    Social Determinants of Health   Financial Resource Strain: Not on file  Food Insecurity: Not on file  Transportation Needs: Not on file  Physical Activity: Not on file  Stress: Not on file  Social Connections: Not on file  Intimate Partner Violence: Not on file    Family History  Problem Relation Age of Onset  . Dementia Mother   . Ulcers Father   . Heart attack Brother   . Throat cancer Brother   . Heart attack Daughter   . Diabetes Daughter   . Depression Daughter      Current Outpatient Medications:  .  fluticasone (FLONASE) 50 MCG/ACT nasal spray, Place 1 spray into both nostrils daily., Disp: , Rfl:  .  lidocaine-prilocaine (EMLA) cream, Apply to affected area once, Disp: 30 g, Rfl: 3 .  mirtazapine (REMERON) 7.5 MG tablet, Take 1 tablet (7.5 mg total) by mouth at bedtime., Disp: 30 tablet, Rfl: 0 .  OLANZapine (ZYPREXA) 10 MG tablet, Take 1 tablet (10 mg total) by mouth at bedtime., Disp: 30 tablet, Rfl: 1 .  ondansetron (ZOFRAN) 8 MG tablet, Take 1 tablet (8 mg total) by mouth 2 (two) times daily as needed (Nausea  or vomiting)., Disp: 30 tablet, Rfl: 1 .  prochlorperazine (COMPAZINE) 10 MG tablet, Take 1 tablet (10 mg total) by mouth every 6 (six) hours as needed (Nausea or vomiting)., Disp: 30 tablet, Rfl: 1 No current facility-administered medications for this visit.  Facility-Administered Medications Ordered in Other Visits:  .  0.9 %  sodium chloride infusion, , Intravenous, Once, Sindy Guadeloupe, MD  Physical exam:  Vitals:   05/08/20 0918  BP: 131/78  Pulse: 92  Resp: 16  Temp: 98.8 F (37.1 C)  TempSrc: Tympanic  SpO2: 99%  Weight: 151 lb 3.2 oz (68.6 kg)   Physical Exam Constitutional:      Comments: Thin gentleman who appears in no acute distress.  Appears mildly fatigued  Eyes:     Extraocular Movements: EOM normal.     Pupils: Pupils are equal, round, and reactive to light.     Comments: Sclerae mildly icteric  Cardiovascular:     Rate and Rhythm: Regular  rhythm. Tachycardia present.     Heart sounds: Normal heart sounds.  Pulmonary:     Effort: Pulmonary effort is normal.     Breath sounds: Normal breath sounds.  Abdominal:     General: Bowel sounds are normal.     Palpations: Abdomen is soft.  Skin:    General: Skin is warm and dry.  Neurological:     Mental Status: He is alert and oriented to person, place, and time.      CMP Latest Ref Rng & Units 05/02/2020  Glucose 70 - 99 mg/dL 126(H)  BUN 8 - 23 mg/dL 17  Creatinine 0.61 - 1.24 mg/dL 0.73  Sodium 135 - 145 mmol/L 132(L)  Potassium 3.5 - 5.1 mmol/L 3.7  Chloride 98 - 111 mmol/L 96(L)  CO2 22 - 32 mmol/L 26  Calcium 8.9 - 10.3 mg/dL 9.1  Total Protein 6.5 - 8.1 g/dL 8.1  Total Bilirubin 0.3 - 1.2 mg/dL 1.7(H)  Alkaline Phos 38 - 126 U/L 397(H)  AST 15 - 41 U/L 148(H)  ALT 0 - 44 U/L 151(H)   CBC Latest Ref Rng & Units 05/02/2020  WBC 4.0 - 10.5 K/uL 10.4  Hemoglobin 13.0 - 17.0 g/dL 15.1  Hematocrit 39.0 - 52.0 % 45.3  Platelets 150 - 400 K/uL 223    No images are attached to the encounter.  MR  Abdomen W Wo Contrast  Result Date: 04/13/2020 CLINICAL DATA:  Liver mass is seen on chest CT, for further characterization. Cirrhosis. EXAM: MRI ABDOMEN WITHOUT AND WITH CONTRAST TECHNIQUE: Multiplanar multisequence MR imaging of the abdomen was performed both before and after the administration of intravenous contrast. CONTRAST:  43mL GADAVIST GADOBUTROL 1 MMOL/ML IV SOLN COMPARISON:  04/06/2020 FINDINGS: Lower chest: Median sternotomy. Hepatobiliary: Background hemochromatosis involving the liver with low T1 and T2 signal and signal dropout on in-phase imaging. Nodular hepatic contour compatible cirrhosis. Infiltrating 7.9 by 6.9 by 6.9 cm mass in the lateral segment left hepatic lobe with associated extensor of enhancing tumor thrombus in the left portal vein as well as numerous additional multifocal enhancing tumors throughout the left hepatic lobe and some in the right hepatic lobe. Another mass in segment 4a of the liver measures 3.4 by 2.9 cm on image 4 of series 6. These masses demonstrate mostly low-grade enhancement including low-grade arterial phase enhancement. Many of these continue to demonstrate delayed enhancement without a characteristic washout or capsule appearance. Pancreas:  Unremarkable Spleen: The spleen measures 13.8 by 6.6 by 8.8 cm (volume = 420 cm^3). Adrenals/Urinary Tract:  Unremarkable Stomach/Bowel: Unremarkable Vascular/Lymphatic: Aortoiliac atherosclerotic vascular disease. Small porta hepatis lymph nodes are not pathologically enlarged. Other:  No supplemental non-categorized findings. Musculoskeletal: Unremarkable IMPRESSION: 1. Infiltrating 7.9 by 6.9 by 6.9 cm mass in the lateral segment left hepatic lobe with associated enhancing tumor thrombus filling the left portal vein as well as numerous additional multifocal enhancing tumors throughout the left hepatic lobe and some in the right hepatic lobe. Although otherwise suspicious for multifocal hepatocellular carcinoma, the  enhancement pattern is not otherwise specific, with only low-grade arterial phase enhancement and persistent delayed phase enhancement which would be somewhat atypical for [facility]. Accordingly, the possibility of cholangiocarcinoma or metastatic disease cannot be readily excluded and tissue diagnosis might be appropriate. Consider multidisciplinary cancer conference referral for further discussion. 2. Background hemochromatosis involving the liver. Borderline splenomegaly. 3.  Aortic Atherosclerosis (ICD10-I70.0). Electronically Signed   By: Van Clines M.D.   On: 04/13/2020 14:00   PERIPHERAL VASCULAR CATHETERIZATION  Result Date: 04/30/2020 See op note    Assessment and plan- Patient is a 65 y.o. male with stage IIIb multifocal locally advanced hepatocellular carcinoma cT4 N0 M0.  He is here for on treatment assessment prior to cycle 1 of Tecentriq and Avastin  We are still working on Government social research officer for [employer] and Avastin.  I had done a peer to peer as well.  Hopefully he should be able to get it on 05/10/2020.  He does have baseline abnormal LFTs likely secondary to large hepatocellular carcinoma.  No dose adjustment required for both Tecentriq and Avastin with liver failure.  Hopefully LFTs will improve if he response to treatment.  Over the last 3 weeks patient has lost about 10 pounds and reports poor appetite and ongoing nausea.  Zyprexa 10 mg at night was started yesterday.  He also has as needed Zofran Compazine.  We will have him hold Remeron which was mainly started for sleep but Zyprexa may be a better medication for nausea appetite and sleep together.  We will give him 1 L of IV fluids today.  Reassess symptoms with NP Altha Harm in about 10 days time.  At that time it may be worthwhile considering starting an antidepressant as well.  He will also need goals of care discussion and possible IV fluids.  I will see him back in 3 weeks with CBC with differential CMP and AFP  for cycle 2 of Tecentriq and Avastin Visit Diagnosis 1. Cancer, hepatocellular ([place])   2. Encounter for monoclonal antibody treatment for malignancy   3. Encounter for antineoplastic immunotherapy   4. Goals of care, counseling/discussion   5. Abnormal LFTs      Dr. Randa Evens, MD, MPH [address] [facility] at [facility] [phone] 05/08/2020 10:21 AM

## 2020-05-09 ENCOUNTER — Telehealth: Payer: Self-pay | Admitting: *Deleted

## 2020-05-09 NOTE — Telephone Encounter (Signed)
Called  Max and let her know that we just got off phone with approval of tecentriq and avastin. She will let pt know and we are so happy that it is now taken care of. I did tell Max that dr Janese Banks said since we have put him on zyprexa and he should stop the remeron but dr Janese Banks said when he comes to see josh in the future that she has left a few meds that josh can look into giving him for depression.  She wanted me to know that pt does not have nausea. He just vomits. He says he did not feel nauseated before he vomits or after he vomits. It just happens and he feels better but was not nauseated.

## 2020-05-10 ENCOUNTER — Other Ambulatory Visit: Payer: Self-pay | Admitting: *Deleted

## 2020-05-10 ENCOUNTER — Other Ambulatory Visit: Payer: Self-pay

## 2020-05-10 ENCOUNTER — Inpatient Hospital Stay: Payer: 59

## 2020-05-10 VITALS — BP 127/79 | HR 77 | Temp 98.5°F | Resp 18 | Wt 150.2 lb

## 2020-05-10 DIAGNOSIS — R809 Proteinuria, unspecified: Secondary | ICD-10-CM

## 2020-05-10 DIAGNOSIS — Z5112 Encounter for antineoplastic immunotherapy: Secondary | ICD-10-CM | POA: Diagnosis not present

## 2020-05-10 DIAGNOSIS — C220 Liver cell carcinoma: Secondary | ICD-10-CM

## 2020-05-10 DIAGNOSIS — K7469 Other cirrhosis of liver: Secondary | ICD-10-CM

## 2020-05-10 LAB — CBC WITH DIFFERENTIAL/PLATELET
Abs Immature Granulocytes: 0.09 10*3/uL — ABNORMAL HIGH (ref 0.00–0.07)
Basophils Absolute: 0.1 10*3/uL (ref 0.0–0.1)
Basophils Relative: 1 %
Eosinophils Absolute: 0.1 10*3/uL (ref 0.0–0.5)
Eosinophils Relative: 1 %
HCT: 47.6 % (ref 39.0–52.0)
Hemoglobin: 16.1 g/dL (ref 13.0–17.0)
Immature Granulocytes: 1 %
Lymphocytes Relative: 15 %
Lymphs Abs: 1.4 10*3/uL (ref 0.7–4.0)
MCH: 32.7 pg (ref 26.0–34.0)
MCHC: 33.8 g/dL (ref 30.0–36.0)
MCV: 96.6 fL (ref 80.0–100.0)
Monocytes Absolute: 1 10*3/uL (ref 0.1–1.0)
Monocytes Relative: 10 %
Neutro Abs: 7.2 10*3/uL (ref 1.7–7.7)
Neutrophils Relative %: 72 %
Platelets: 232 10*3/uL (ref 150–400)
RBC: 4.93 MIL/uL (ref 4.22–5.81)
RDW: 14.6 % (ref 11.5–15.5)
WBC: 9.7 10*3/uL (ref 4.0–10.5)
nRBC: 0 % (ref 0.0–0.2)

## 2020-05-10 LAB — PROTEIN / CREATININE RATIO, URINE
Creatinine, Urine: 291 mg/dL
Protein Creatinine Ratio: 0.34 mg/mg{Cre} — ABNORMAL HIGH (ref 0.00–0.15)
Total Protein, Urine: 98 mg/dL

## 2020-05-10 LAB — URINALYSIS, DIPSTICK ONLY
Bilirubin Urine: NEGATIVE
Glucose, UA: NEGATIVE mg/dL
Hgb urine dipstick: NEGATIVE
Ketones, ur: NEGATIVE mg/dL
Leukocytes,Ua: NEGATIVE
Nitrite: NEGATIVE
Protein, ur: 100 mg/dL — AB
Specific Gravity, Urine: 1.026 (ref 1.005–1.030)
pH: 5 (ref 5.0–8.0)

## 2020-05-10 LAB — COMPREHENSIVE METABOLIC PANEL
ALT: 110 U/L — ABNORMAL HIGH (ref 0–44)
AST: 87 U/L — ABNORMAL HIGH (ref 15–41)
Albumin: 3 g/dL — ABNORMAL LOW (ref 3.5–5.0)
Alkaline Phosphatase: 498 U/L — ABNORMAL HIGH (ref 38–126)
Anion gap: 11 (ref 5–15)
BUN: 12 mg/dL (ref 8–23)
CO2: 25 mmol/L (ref 22–32)
Calcium: 9 mg/dL (ref 8.9–10.3)
Chloride: 99 mmol/L (ref 98–111)
Creatinine, Ser: 0.64 mg/dL (ref 0.61–1.24)
GFR, Estimated: >60 mL/min (ref >60)
Glucose, Bld: 113 mg/dL — ABNORMAL HIGH (ref 70–99)
Potassium: 4.1 mmol/L (ref 3.5–5.1)
Sodium: 135 mmol/L (ref 135–145)
Total Bilirubin: 1.6 mg/dL — ABNORMAL HIGH (ref 0.3–1.2)
Total Protein: 7.8 g/dL (ref 6.5–8.1)

## 2020-05-10 LAB — TSH: TSH: 1.547 u[IU]/mL (ref 0.350–4.500)

## 2020-05-10 MED ORDER — SODIUM CHLORIDE 0.9 % IV SOLN
Freq: Once | INTRAVENOUS | Status: AC
  Filled 2020-05-10: qty 250

## 2020-05-10 MED ORDER — SODIUM CHLORIDE 0.9 % IV SOLN
1100.0000 mg | Freq: Once | INTRAVENOUS | Status: DC
  Filled 2020-05-10: qty 44

## 2020-05-10 MED ORDER — BEVACIZUMAB CHEMO INJECTION 400 MG/16ML
15.0000 mg/kg | Freq: Once | INTRAVENOUS | Status: DC
  Filled 2020-05-10: qty 44

## 2020-05-10 MED ORDER — HEPARIN SOD (PORK) LOCK FLUSH 100 UNIT/ML IV SOLN
500.0000 [IU] | Freq: Once | INTRAVENOUS | Status: AC
  Administered 2020-05-10: 500 [IU] via INTRAVENOUS
  Filled 2020-05-10: qty 5

## 2020-05-10 MED ORDER — SODIUM CHLORIDE 0.9% FLUSH
10.0000 mL | INTRAVENOUS | Status: DC | PRN
  Administered 2020-05-10: 10 mL via INTRAVENOUS
  Filled 2020-05-10: qty 10

## 2020-05-10 MED ORDER — HEPARIN SOD (PORK) LOCK FLUSH 100 UNIT/ML IV SOLN
INTRAVENOUS | Status: AC
  Filled 2020-05-10: qty 5

## 2020-05-10 MED ORDER — ATEZOLIZUMAB CHEMO INJECTION 1200 MG/20ML
1200.0000 mg | Freq: Once | INTRAVENOUS | Status: AC
  Administered 2020-05-10: 1200 mg via INTRAVENOUS
  Filled 2020-05-10: qty 20

## 2020-05-10 NOTE — Progress Notes (Signed)
Avastin (brand) name received from [facility] was expired.  Cannot give drug today.  Will order for tomorrow and patient to be rescheduled.

## 2020-05-10 NOTE — Progress Notes (Signed)
MD ok with liver function labs.

## 2020-05-10 NOTE — Progress Notes (Deleted)
Urine Protein: 100. MD, Dr. Rogue Bussing, notified and aware. Per MD order: proceed with scheduled New Avastin treatment today.

## 2020-05-10 NOTE — Progress Notes (Signed)
1100- Urine Protein: 100. MD, Dr. Rogue Bussing, notified and aware. Per MD order: proceed with scheduled New Avastin treatment today.  1143- Unable to administer Avastin treatment today due to pharmacy not having Avastin drug in stock. Patient is being rescheduled for Avastin treatment until tomorrow, 05/11/2020, at 2:30 pm. Patient has been verbally updated by Novella Olive, RN and myself, Nemiah Commander, RN. Patient verbalized understanding. Novella Olive, RN, is updating patient's ex-wife, Dartanion Teo, via telephone.

## 2020-05-11 ENCOUNTER — Inpatient Hospital Stay: Payer: 59

## 2020-05-11 ENCOUNTER — Encounter: Payer: Self-pay | Admitting: Oncology

## 2020-05-11 ENCOUNTER — Telehealth: Payer: Self-pay

## 2020-05-11 VITALS — BP 136/84 | HR 92 | Temp 96.1°F | Resp 20

## 2020-05-11 DIAGNOSIS — Z5112 Encounter for antineoplastic immunotherapy: Secondary | ICD-10-CM | POA: Diagnosis not present

## 2020-05-11 DIAGNOSIS — C220 Liver cell carcinoma: Secondary | ICD-10-CM

## 2020-05-11 LAB — T4: T4, Total: 11 ug/dL (ref 4.5–12.0)

## 2020-05-11 LAB — AFP TUMOR MARKER: AFP, Serum, Tumor Marker: 68274 ng/mL — ABNORMAL HIGH (ref 0.0–8.3)

## 2020-05-11 LAB — PROTEIN, URINE, RANDOM: Total Protein, Urine: 97 mg/dL

## 2020-05-11 MED ORDER — SODIUM CHLORIDE 0.9 % IV SOLN
INTRAVENOUS | Status: DC
  Filled 2020-05-11: qty 250

## 2020-05-11 MED ORDER — HEPARIN SOD (PORK) LOCK FLUSH 100 UNIT/ML IV SOLN
500.0000 [IU] | Freq: Once | INTRAVENOUS | Status: AC
  Administered 2020-05-11: 500 [IU] via INTRAVENOUS
  Filled 2020-05-11: qty 5

## 2020-05-11 MED ORDER — SODIUM CHLORIDE 0.9 % IV SOLN
15.0000 mg/kg | Freq: Once | INTRAVENOUS | Status: AC
  Administered 2020-05-11: 1100 mg via INTRAVENOUS
  Filled 2020-05-11: qty 32

## 2020-05-11 MED ORDER — SODIUM CHLORIDE 0.9% FLUSH
10.0000 mL | Freq: Once | INTRAVENOUS | Status: AC
  Administered 2020-05-11: 10 mL via INTRAVENOUS
  Filled 2020-05-11: qty 10

## 2020-05-11 NOTE — Telephone Encounter (Signed)
Telephone call to patient for follow up after receiving first infusion.   Patient states infusion went great but only received one due to the other being expired.  Came today for his other drug.  States eating good and fluids throughout the day.   Denies any nausea or vomiting.  Encouraged patient to call for any concerns or questions.

## 2020-05-14 LAB — HEMOCHROMATOSIS DNA-PCR(C282Y,H63D)

## 2020-05-15 ENCOUNTER — Encounter: Payer: Self-pay | Admitting: Oncology

## 2020-05-16 ENCOUNTER — Encounter: Payer: Self-pay | Admitting: Gastroenterology

## 2020-05-17 ENCOUNTER — Other Ambulatory Visit
Admission: RE | Admit: 2020-05-17 | Discharge: 2020-05-17 | Disposition: A | Discharge status: Home / Self Care | Payer: 59 | Source: Ambulatory Visit | Attending: Gastroenterology | Admitting: Gastroenterology

## 2020-05-17 ENCOUNTER — Other Ambulatory Visit: Payer: Self-pay

## 2020-05-17 ENCOUNTER — Inpatient Hospital Stay: Payer: 59

## 2020-05-17 DIAGNOSIS — Z20822 Contact with and (suspected) exposure to covid-19: Secondary | ICD-10-CM | POA: Diagnosis not present

## 2020-05-17 DIAGNOSIS — Z01812 Encounter for preprocedural laboratory examination: Secondary | ICD-10-CM | POA: Diagnosis present

## 2020-05-17 LAB — SARS CORONAVIRUS 2 (TAT 6-24 HRS): SARS Coronavirus 2: NEGATIVE

## 2020-05-17 NOTE — Progress Notes (Signed)
Nutrition Assessment:  Patient with advanced hepatocellular cancer.  Past medical history of HTN, HLD, CAD, s/p CABG, smoker.  Patient receiving tecentriq and avastin.    Spoke with patient via phone and Ms Siedschlag.  Patient reports poor appetite and full feeling.  Patient may eat couple of ding dongs, fruit, crackers during the day. Last night ate 2 chicken wings for dinner. If having a sandwich eats about 1/4 of it.   Reports vomiting this am after drinking thick protein shake and putting teeth in.  This is the first time in 3 weeks he throw up.  Does not have nausea, just vomits randomly.  Reports has bowel movement each morning, denies constipation.  Patient lives alone.  Family has purchased frozen meals for patient, easy to prepare foods and brings foods in for patient to eat.    Medications: zyprexa, zofran, compazine  Labs: reviewed  Anthropometrics:   Height: 70 inches Weight: 150 lb 3.2 oz on 3/3 177 lb on 08/31/19 BMI: 21  15% weight loss in the last 9 months, concerning   Estimated Energy Needs  Kcals: 2000-2400 Protein: 100-120 g Fluid: > 2 L  NUTRITION DIAGNOSIS: Inadequate oral intake related to cancer and cancer related treatment side effects as evidenced by 15% weight loss in the last 9 months and poor po intake   INTERVENTION:  Encouraged small frequent meals q 2 hours. Discussed options Discussed ways to add calories and protein in diet. Will provided handout that patient will pick up tomorrow Encouraged high calorie oral nutrition supplement (350 calories) if able to tolerate     MONITORING, EVALUATION, GOAL: weight trends, intake   NEXT VISIT: Tuesday, April 12 during infusion  Cruze Zingaro B. Zenia Resides, [place], [place] Registered Dietitian [phone] (mobile)

## 2020-05-18 ENCOUNTER — Inpatient Hospital Stay: Payer: 59

## 2020-05-18 ENCOUNTER — Inpatient Hospital Stay (HOSPITAL_BASED_OUTPATIENT_CLINIC_OR_DEPARTMENT_OTHER): Payer: 59 | Admitting: Hospice and Palliative Medicine

## 2020-05-18 VITALS — BP 121/82 | HR 90 | Temp 99.0°F | Resp 18

## 2020-05-18 DIAGNOSIS — Z515 Encounter for palliative care: Secondary | ICD-10-CM

## 2020-05-18 DIAGNOSIS — C220 Liver cell carcinoma: Secondary | ICD-10-CM | POA: Diagnosis not present

## 2020-05-18 DIAGNOSIS — E860 Dehydration: Secondary | ICD-10-CM

## 2020-05-18 DIAGNOSIS — G893 Neoplasm related pain (acute) (chronic): Secondary | ICD-10-CM

## 2020-05-18 DIAGNOSIS — Z5112 Encounter for antineoplastic immunotherapy: Secondary | ICD-10-CM | POA: Diagnosis not present

## 2020-05-18 LAB — CBC WITH DIFFERENTIAL/PLATELET
Abs Immature Granulocytes: 0.04 10*3/uL (ref 0.00–0.07)
Basophils Absolute: 0.1 10*3/uL (ref 0.0–0.1)
Basophils Relative: 1 %
Eosinophils Absolute: 0.1 10*3/uL (ref 0.0–0.5)
Eosinophils Relative: 1 %
HCT: 46.5 % (ref 39.0–52.0)
Hemoglobin: 16 g/dL (ref 13.0–17.0)
Immature Granulocytes: 1 %
Lymphocytes Relative: 17 %
Lymphs Abs: 1.4 10*3/uL (ref 0.7–4.0)
MCH: 33.1 pg (ref 26.0–34.0)
MCHC: 34.4 g/dL (ref 30.0–36.0)
MCV: 96.3 fL (ref 80.0–100.0)
Monocytes Absolute: 0.9 10*3/uL (ref 0.1–1.0)
Monocytes Relative: 11 %
Neutro Abs: 5.9 10*3/uL (ref 1.7–7.7)
Neutrophils Relative %: 69 %
Platelets: 225 10*3/uL (ref 150–400)
RBC: 4.83 MIL/uL (ref 4.22–5.81)
RDW: 14.8 % (ref 11.5–15.5)
WBC: 8.4 10*3/uL (ref 4.0–10.5)
nRBC: 0 % (ref 0.0–0.2)

## 2020-05-18 LAB — COMPREHENSIVE METABOLIC PANEL
ALT: 93 U/L — ABNORMAL HIGH (ref 0–44)
AST: 100 U/L — ABNORMAL HIGH (ref 15–41)
Albumin: 3 g/dL — ABNORMAL LOW (ref 3.5–5.0)
Alkaline Phosphatase: 471 U/L — ABNORMAL HIGH (ref 38–126)
Anion gap: 10 (ref 5–15)
BUN: 13 mg/dL (ref 8–23)
CO2: 25 mmol/L (ref 22–32)
Calcium: 9.2 mg/dL (ref 8.9–10.3)
Chloride: 99 mmol/L (ref 98–111)
Creatinine, Ser: 0.75 mg/dL (ref 0.61–1.24)
GFR, Estimated: >60 mL/min (ref >60)
Glucose, Bld: 102 mg/dL — ABNORMAL HIGH (ref 70–99)
Potassium: 4.2 mmol/L (ref 3.5–5.1)
Sodium: 134 mmol/L — ABNORMAL LOW (ref 135–145)
Total Bilirubin: 1.4 mg/dL — ABNORMAL HIGH (ref 0.3–1.2)
Total Protein: 7.9 g/dL (ref 6.5–8.1)

## 2020-05-18 MED ORDER — SODIUM CHLORIDE 0.9 % IV SOLN
Freq: Once | INTRAVENOUS | Status: AC
  Filled 2020-05-18: qty 250

## 2020-05-18 MED ORDER — HEPARIN SOD (PORK) LOCK FLUSH 100 UNIT/ML IV SOLN
500.0000 [IU] | Freq: Once | INTRAVENOUS | Status: AC
  Administered 2020-05-18: 500 [IU] via INTRAVENOUS
  Filled 2020-05-18: qty 5

## 2020-05-18 MED ORDER — SODIUM CHLORIDE 0.9% FLUSH
10.0000 mL | INTRAVENOUS | Status: DC | PRN
  Administered 2020-05-18: 10 mL via INTRAVENOUS
  Filled 2020-05-18: qty 10

## 2020-05-18 NOTE — Progress Notes (Signed)
Patient received IV hydration this am. Reports appetite remains low. Pt saw palliative care, Josh Borders while in clinic. Discharged from clinic ambulatory. Accompanied by ex wife.

## 2020-05-18 NOTE — Progress Notes (Signed)
[facility]  [phone] [phone]   Name: Maxwell Edwards Date: 05/18/2020 MRN: [id number]  DOB: 1956/01/24  Patient Care Team: Venita Lick, NP as PCP - General (Nurse Practitioner) Clent Jacks, RN as Oncology Nurse Navigator    REASON FOR CONSULTATION: Maxwell Edwards is a 65 y.o. male with multiple medical problems including CAD status post CABG, hypertension, hyperlipidemia, and multifocal locally advanced hepatocellular carcinoma on systemic treatment with Tecentriq and Avastin.  Patient has been symptomatic with nausea, weight loss, and insomnia.  Palliative care was consulted up address goals and manage ongoing symptoms.  SOCIAL HISTORY:     reports that he has been smoking cigarettes. He has been smoking about 0.50 packs per day. He has never used smokeless tobacco. He reports previous alcohol use. He reports previous drug use.  Patient is divorced.  He lives at home alone.  His ex-wife is still involved in his care as are his 2 daughters.  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 CAD (coronary artery disease)   . Heart disease     PAST SURGICAL HISTORY:  Past Surgical History:  Procedure Laterality Date  . CORONARY ARTERY BYPASS GRAFT     at 65 years old  . PORTA CATH INSERTION N/A 04/30/2020   Procedure: PORTA CATH INSERTION;  Surgeon: Algernon Huxley, MD;  Location: [facility];  Service: Cardiovascular;  Laterality: N/A;  . VEIN BYPASS SURGERY      HEMATOLOGY/ONCOLOGY HISTORY:  Oncology History  Cancer, hepatocellular ([place])  04/20/2020 Cancer Staging   Staging form: Liver, AJCC 8th Edition - Clinical stage from 04/20/2020: Stage IIIB (cT4, cN0, cM0) - Signed by Sindy Guadeloupe, MD on 04/21/2020   04/21/2020 Initial Diagnosis    Cancer, hepatocellular ([place])   05/10/2020 -  Chemotherapy    Patient is on Treatment Plan: LIVER ATEZOLIZUMAB + BEVACIZUMAB Q21D        ALLERGIES:  has No Known Allergies.  MEDICATIONS:  Current Outpatient Medications  Medication Sig Dispense Refill  . fluticasone (FLONASE) 50 MCG/ACT nasal spray Place 1 spray into both nostrils daily.    Marland Kitchen lidocaine-prilocaine (EMLA) cream Apply to affected area once 30 g 3  . mirtazapine (REMERON) 7.5 MG tablet Take 1 tablet (7.5 mg total) by mouth at bedtime. 30 tablet 0  . OLANZapine (ZYPREXA) 10 MG tablet Take 1 tablet (10 mg total) by mouth at bedtime. 30 tablet 1  . ondansetron (ZOFRAN) 8 MG tablet Take 1 tablet (8 mg total) by mouth 2 (two) times daily as needed (Nausea or vomiting). 30 tablet 1  . prochlorperazine (COMPAZINE) 10 MG tablet Take 1 tablet (10 mg total) by mouth every 6 (six) hours as needed (Nausea or vomiting). 30 tablet 1   No current facility-administered medications for this visit.   Facility-Administered Medications Ordered in Other Visits  Medication Dose Route Frequency Provider Last Rate Last Admin  . sodium chloride flush (NS) 0.9 % injection 10 mL  10 mL Intravenous PRN Sindy Guadeloupe, MD   10 mL at 05/18/20 0803    VITAL SIGNS: There were no vitals taken for this visit. There were no vitals filed for this visit.  Estimated body mass index is 21.55 kg/m as calculated from the following:   Height as of 04/30/20: 5'  10" (1.778 m).   Weight as of 05/10/20: 150 lb 3.2 oz (68.1 kg).  LABS: CBC:    Component Value Date/Time   WBC 8.4 05/18/2020 0805   HGB 16.0 05/18/2020 0805   HGB 18.3 (H) 04/06/2020 1152   HCT 46.5 05/18/2020 0805   HCT 53.8 (H) 04/06/2020 1152   PLT 225 05/18/2020 0805   PLT 247 04/06/2020 1152   MCV 96.3 05/18/2020 0805   MCV 94 04/06/2020 1152   NEUTROABS 5.9 05/18/2020 0805   NEUTROABS 7.1 (H) 04/06/2020 1152   LYMPHSABS 1.4 05/18/2020 0805   LYMPHSABS 2.1 04/06/2020 1152   MONOABS 0.9  05/18/2020 0805   EOSABS 0.1 05/18/2020 0805   EOSABS 0.1 04/06/2020 1152   BASOSABS 0.1 05/18/2020 0805   BASOSABS 0.1 04/06/2020 1152   Comprehensive Metabolic Panel:    Component Value Date/Time   NA 134 (L) 05/18/2020 0805   NA 135 04/06/2020 1152   K 4.2 05/18/2020 0805   CL 99 05/18/2020 0805   CO2 25 05/18/2020 0805   BUN 13 05/18/2020 0805   BUN 15 04/06/2020 1152   CREATININE 0.75 05/18/2020 0805   GLUCOSE 102 (H) 05/18/2020 0805   CALCIUM 9.2 05/18/2020 0805   AST 100 (H) 05/18/2020 0805   ALT 93 (H) 05/18/2020 0805   ALKPHOS 471 (H) 05/18/2020 0805   BILITOT 1.4 (H) 05/18/2020 0805   BILITOT 0.6 04/06/2020 1152   PROT 7.9 05/18/2020 0805   PROT 8.2 04/06/2020 1152   ALBUMIN 3.0 (L) 05/18/2020 0805   ALBUMIN 4.3 04/06/2020 1152    RADIOGRAPHIC STUDIES: PERIPHERAL VASCULAR CATHETERIZATION  Result Date: 04/30/2020 See op note   PERFORMANCE STATUS (ECOG) : 1 - Symptomatic but completely ambulatory  Review of Systems Unless otherwise noted, a complete review of systems is negative.  Physical Exam General: NAD Pulmonary: Unlabored Abdomen: soft, nontender GU: no suprapubic tenderness Extremities: no edema, no joint deformities Skin: no rashes Neurological: Weakness but otherwise nonfocal  IMPRESSION: I met with patient and his ex-wife today in the clinic.  I introduced palliative care services and attempted to establish therapeutic rapport.  Patient has chronically poor oral intake and early satiety.  He is being followed by nutritionist.  We discussed importance of high-calorie/high-protein foods.  He is drinking boost twice daily.  I recommended that he increase oral new nutritional supplements 2 to 3-4 times daily.  Patient is eating about every couple of hours.  He has had occasional nausea.  He vomited yesterday but attributed that to his gag reflex when he inserted his teeth.  Overall, he feels the nausea has improved and is not limiting his ability  to eat.  Patient does still have difficulty sleeping.  He was previously tried on mirtazapine for appetite and sleep but quickly rotated to olanzapine to help with sleep and nausea.  He says the olanzapine is helping initiate sleep but he wakes about 1 or 2 in the morning and cannot go back to sleep.  However, patient also endorses frequent daytime napping.  We spoke at length today about the importance of sleep hygiene.  I recommended limiting daytime napping.  Patient responded that he "did not know" when asked if he was having any depression or anxiety.  However, patient became visibly upset and tearful with this question.  He was interested in initiating treatment with an antidepressant.  Discussed options with Dr. Janese Banks.  He may benefit from an SSRI but I am hesitant given the potential for GI side effects.  Will plan to hold olanzapine and restart mirtazapine at 7.5 mg nightly with consideration for increasing dose to 15 mg if needed.  Patient reports that he has good support from his ex-wife and daughters.  However, he does live at home alone.  Patient is still functionally independent with his own care but he describes significant fatigue that limits tasks.  Will refer to [facility] for screening.   PLAN: -Continue current scope of treatment -Hold olanzapine -Restart mirtazapine 7.5 mg nightly with consideration to increase dose to 15 mg nightly if needed -Discussed importance of sleep hygiene -Increase oral nutritional supplements to 3-4 times daily -Referral to [facility] -Referral to home-based PC -Will benefit from future completion of a MOST form -RTC in 2 weeks  Case and plan discussed with Dr. Janese Banks   Patient expressed understanding and was in agreement with this plan. He also understands that He can call the clinic at any time with any questions, concerns, or complaints.     Time Total: 30 minutes  Visit consisted of counseling and education dealing with the complex and emotionally  intense issues of symptom management and palliative care in the setting of serious and potentially life-threatening illness.Greater than 50%  of this time was spent counseling and coordinating care related to the above assessment and plan.  Signed by: Altha Harm, PhD, NP-C

## 2020-05-21 ENCOUNTER — Encounter: Payer: Self-pay | Admitting: Oncology

## 2020-05-21 ENCOUNTER — Ambulatory Visit
Admission: RE | Admit: 2020-05-21 | Discharge: 2020-05-21 | Disposition: A | Discharge status: Home / Self Care | Payer: 59 | Attending: Gastroenterology | Admitting: Gastroenterology

## 2020-05-21 ENCOUNTER — Ambulatory Visit: Payer: 59 | Admitting: Anesthesiology

## 2020-05-21 ENCOUNTER — Encounter: Payer: Self-pay | Admitting: Gastroenterology

## 2020-05-21 ENCOUNTER — Encounter
Admission: RE | Disposition: A | Discharge status: Home / Self Care | Payer: Self-pay | Source: Home / Self Care | Attending: Gastroenterology

## 2020-05-21 ENCOUNTER — Other Ambulatory Visit: Payer: Self-pay

## 2020-05-21 DIAGNOSIS — I8510 Secondary esophageal varices without bleeding: Secondary | ICD-10-CM | POA: Diagnosis not present

## 2020-05-21 DIAGNOSIS — I8500 Esophageal varices without bleeding: Secondary | ICD-10-CM

## 2020-05-21 DIAGNOSIS — K7460 Unspecified cirrhosis of liver: Secondary | ICD-10-CM | POA: Diagnosis present

## 2020-05-21 DIAGNOSIS — F17210 Nicotine dependence, cigarettes, uncomplicated: Secondary | ICD-10-CM | POA: Diagnosis not present

## 2020-05-21 DIAGNOSIS — K2270 Barrett's esophagus without dysplasia: Secondary | ICD-10-CM | POA: Insufficient documentation

## 2020-05-21 DIAGNOSIS — K922 Gastrointestinal hemorrhage, unspecified: Secondary | ICD-10-CM | POA: Insufficient documentation

## 2020-05-21 DIAGNOSIS — K319 Disease of stomach and duodenum, unspecified: Secondary | ICD-10-CM | POA: Diagnosis not present

## 2020-05-21 DIAGNOSIS — Z79899 Other long term (current) drug therapy: Secondary | ICD-10-CM | POA: Insufficient documentation

## 2020-05-21 HISTORY — PX: ESOPHAGOGASTRODUODENOSCOPY (EGD) WITH PROPOFOL: SHX5813

## 2020-05-21 SURGERY — ESOPHAGOGASTRODUODENOSCOPY (EGD) WITH PROPOFOL
Anesthesia: General

## 2020-05-21 MED ORDER — LIDOCAINE HCL (PF) 2 % IJ SOLN
INTRAMUSCULAR | Status: AC
  Filled 2020-05-21: qty 5

## 2020-05-21 MED ORDER — OMEPRAZOLE 40 MG PO CPDR: 40.0000 mg | DELAYED_RELEASE_CAPSULE | Freq: Every day | ORAL | 6 refills | Status: DC

## 2020-05-21 MED ORDER — GLYCOPYRROLATE 0.2 MG/ML IJ SOLN
INTRAMUSCULAR | Status: DC | PRN
  Administered 2020-05-21: .2 mg via INTRAVENOUS

## 2020-05-21 MED ORDER — LIDOCAINE HCL (CARDIAC) PF 100 MG/5ML IV SOSY
PREFILLED_SYRINGE | INTRAVENOUS | Status: DC | PRN
  Administered 2020-05-21: 50 mg via INTRAVENOUS

## 2020-05-21 MED ORDER — PROPOFOL 500 MG/50ML IV EMUL
INTRAVENOUS | Status: AC
  Filled 2020-05-21: qty 50

## 2020-05-21 MED ORDER — PROPOFOL 10 MG/ML IV BOLUS
INTRAVENOUS | Status: DC | PRN
  Administered 2020-05-21: 40 mg via INTRAVENOUS
  Administered 2020-05-21: 30 mg via INTRAVENOUS
  Administered 2020-05-21: 50 mg via INTRAVENOUS
  Administered 2020-05-21: 60 mg via INTRAVENOUS

## 2020-05-21 MED ORDER — SODIUM CHLORIDE 0.9 % IV SOLN: INTRAVENOUS | Status: DC

## 2020-05-21 NOTE — Transfer of Care (Signed)
Immediate Anesthesia Transfer of Care Note  Patient: Maxwell Edwards  Procedure(s) Performed: ESOPHAGOGASTRODUODENOSCOPY (EGD) WITH PROPOFOL (N/A )  Patient Location: PACU and Endoscopy Unit  Anesthesia Type:General  Level of Consciousness: drowsy  Airway & Oxygen Therapy: Patient Spontanous Breathing  Post-op Assessment: Report given to RN and Post -op Vital signs reviewed and stable  Post vital signs: Reviewed and stable  Last Vitals:  Vitals Value Taken Time  BP 89/63 05/21/20 0835  Temp 36.8 C 05/21/20 0835  Pulse 83 05/21/20 0836  Resp 28 05/21/20 0836  SpO2 98 % 05/21/20 0836  Vitals shown include unvalidated device data.  Last Pain:  Vitals:   05/21/20 0835  TempSrc: Temporal  PainSc: Asleep         Complications: No complications documented.

## 2020-05-21 NOTE — Anesthesia Preprocedure Evaluation (Signed)
Anesthesia Evaluation  Patient identified by MRN, date of birth, ID band Patient awake    Reviewed: Allergy & Precautions, H&P , NPO status , Patient's Chart, lab work & pertinent test results  History of Anesthesia Complications Negative for: history of anesthetic complications  Airway Mallampati: III  TM Distance: <3 FB Neck ROM: limited    Dental  (+) Poor Dentition, Missing   Pulmonary COPD, Current Smoker and Patient abstained from smoking.,    Pulmonary exam normal        Cardiovascular Exercise Tolerance: Good hypertension, + CAD and + CABG  Normal cardiovascular exam     Neuro/Psych CVA, Residual Symptoms negative psych ROS   GI/Hepatic negative GI ROS, Neg liver ROS, neg GERD  ,  Endo/Other  negative endocrine ROS  Renal/GU negative Renal ROS  negative genitourinary   Musculoskeletal   Abdominal   Peds  Hematology negative hematology ROS (+)   Anesthesia Other Findings Past Medical History: No date: CAD (coronary artery disease) No date: Heart disease  Past Surgical History: No date: CORONARY ARTERY BYPASS GRAFT     Comment:  at 65 years old 04/30/2020: PORTA CATH INSERTION; N/A     Comment:  Procedure: PORTA CATH INSERTION;  Surgeon: Algernon Huxley,              MD;  Location: [facility];  Service:               Cardiovascular;  Laterality: N/A; No date: VEIN BYPASS SURGERY  BMI    Body Mass Index: 22.66 kg/m      Reproductive/Obstetrics negative OB ROS                             Anesthesia Physical Anesthesia Plan  ASA: III  Anesthesia Plan: General   Post-op Pain Management:    Induction: Intravenous  PONV Risk Score and Plan: Propofol infusion and TIVA  Airway Management Planned: Natural Airway and Nasal Cannula  Additional Equipment:   Intra-op Plan:   Post-operative Plan:   Informed Consent: I have reviewed the patients History and  Physical, chart, labs and discussed the procedure including the risks, benefits and alternatives for the proposed anesthesia with the patient or authorized representative who has indicated his/her understanding and acceptance.     Dental Advisory Given  Plan Discussed with: Anesthesiologist, CRNA and Surgeon  Anesthesia Plan Comments: (Patient consented for risks of anesthesia including but not limited to:  - adverse reactions to medications - risk of airway placement if required - damage to eyes, teeth, lips or other oral mucosa - nerve damage due to positioning  - sore throat or hoarseness - Damage to heart, brain, nerves, lungs, other parts of body or loss of life  Patient voiced understanding.)        Anesthesia Quick Evaluation

## 2020-05-21 NOTE — Op Note (Signed)
[facility] Gastroenterology Patient Name: Maxwell Edwards Procedure Date: 05/21/2020 7:47 AM MRN: [id number] Account #: [id number] Date of Birth: May 17, 1955 Admit Type: Outpatient Age: 65 Room: [facility] ENDO ROOM 1 Gender: Male Note Status: Finalized Procedure:             Upper GI endoscopy Indications:           Cirrhosis rule out esophageal varices Providers:             Lin Landsman MD, MD Referring MD:          Barbaraann Faster. Ned Card (Referring MD) Medicines:             General Anesthesia Complications:         No immediate complications. Estimated blood loss: None. Procedure:             Pre-Anesthesia Assessment:                        - Prior to the procedure, a History and Physical was                         performed, and patient medications and allergies were                         reviewed. The patient is competent. The risks and                         benefits of the procedure and the sedation options and                         risks were discussed with the patient. All questions                         were answered and informed consent was obtained.                         Patient identification and proposed procedure were                         verified by the physician, the nurse, the                         anesthesiologist, the anesthetist and the technician                         in the pre-procedure area. Mental Status Examination:                         alert and oriented. Airway Examination: normal                         oropharyngeal airway and neck mobility. Respiratory                         Examination: clear to auscultation. CV Examination:                         normal. Prophylactic Antibiotics: The patient does not  require prophylactic antibiotics. Prior                         Anticoagulants: The patient has taken no previous                         anticoagulant or antiplatelet agents. ASA Grade                          Assessment: III - A patient with severe systemic                         disease. After reviewing the risks and benefits, the                         patient was deemed in satisfactory condition to                         undergo the procedure. The anesthesia plan was to use                         general anesthesia. Immediately prior to                         administration of medications, the patient was                         re-assessed for adequacy to receive sedatives. The                         heart rate, respiratory rate, oxygen saturations,                         blood pressure, adequacy of pulmonary ventilation, and                         response to care were monitored throughout the                         procedure. The physical status of the patient was                         re-assessed after the procedure.                        After obtaining informed consent, the endoscope was                         passed under direct vision. Throughout the procedure,                         the patient's blood pressure, pulse, and oxygen                         saturations were monitored continuously. The Endoscope                         was introduced through the mouth, and advanced to the  second part of duodenum. The upper GI endoscopy was                         accomplished without difficulty. The patient tolerated                         the procedure well. Findings:      The duodenal bulb and second portion of the duodenum were normal.      Multiple dispersed diminutive erosions with stigmata of recent bleeding       were found in the gastric fundus and in the gastric antrum.      The esophagus and gastroesophageal junction were examined with white       light and narrow band imaging (NBI) from a forward view and retroflexed       position. There were esophageal mucosal changes consistent with       long-segment Barrett's  esophagus. These changes involved the mucosa at       the upper extent of the gastric folds (35 cm from the incisors)       extending to the Z-line (29 cm from the incisors). Circumferential       salmon-colored mucosa was present from 29 to 35 cm and no visible       abnormalities were present. The maximum longitudinal extent of these       esophageal mucosal changes was 6 cm in length. Mucosa was biopsied with       a cold forceps for histology. One specimen bottle was sent to pathology.      Grade I varices were found in the lower third of the esophagus. They       were small in size. Impression:            - Normal duodenal bulb and second portion of the                         duodenum.                        - Erosive gastropathy with stigmata of recent bleeding.                        - Esophageal mucosal changes consistent with                         long-segment Barrett's esophagus. Biopsied.                        - Grade I esophageal varices. Recommendation:        - Discharge patient to home (with escort).                        - Resume previous diet today.                        - Continue present medications.                        - Await pathology results. Procedure Code(s):     --- Professional ---                        [phone], Esophagogastroduodenoscopy,  flexible,                         transoral; with biopsy, single or multiple Diagnosis Code(s):     --- Professional ---                        K22.8, Other specified diseases of esophagus                        K74.60, Unspecified cirrhosis of liver                        K92.2, Gastrointestinal hemorrhage, unspecified                        I85.10, Secondary esophageal varices without bleeding CPT copyright 2019 American Medical Association. All rights reserved. The codes documented in this report are preliminary and upon coder review may  be revised to meet current compliance requirements. Dr. Ulyess Mort Lin Landsman MD, MD 05/21/2020 8:36:57 AM This report has been signed electronically. Number of Addenda: 0 Note Initiated On: 05/21/2020 7:47 AM Estimated Blood Loss:  Estimated blood loss: none.      [facility]

## 2020-05-21 NOTE — Anesthesia Postprocedure Evaluation (Signed)
Anesthesia Post Note  Patient: Maxwell Edwards  Procedure(s) Performed: ESOPHAGOGASTRODUODENOSCOPY (EGD) WITH PROPOFOL (N/A )  Patient location during evaluation: Endoscopy Anesthesia Type: General Level of consciousness: awake and alert Pain management: pain level controlled Vital Signs Assessment: post-procedure vital signs reviewed and stable Respiratory status: spontaneous breathing, nonlabored ventilation, respiratory function stable and patient connected to nasal cannula oxygen Cardiovascular status: blood pressure returned to baseline and stable Postop Assessment: no apparent nausea or vomiting Anesthetic complications: no   No complications documented.   Last Vitals:  Vitals:   05/21/20 0855 05/21/20 0905  BP: 111/74 110/80  Pulse: 82 80  Resp: (!) 29 17  Temp:    SpO2: 99% 99%    Last Pain:  Vitals:   05/21/20 0905  TempSrc:   PainSc: 0-No pain                 Precious Haws Bonny Egger

## 2020-05-21 NOTE — Anesthesia Procedure Notes (Signed)
Procedure Name: MAC Date/Time: 05/21/2020 8:21 AM Performed by: Jerrye Noble, CRNA Pre-anesthesia Checklist: Patient identified, Emergency Drugs available, Suction available and Patient being monitored Patient Re-evaluated:Patient Re-evaluated prior to induction Oxygen Delivery Method: Nasal cannula

## 2020-05-21 NOTE — H&P (Signed)
Maxwell Darby, MD [address]  [place]  [place], [place] [address]  Main: [phone]  Fax: [phone] Pager: [phone]  Primary Care Physician:  Venita Lick, NP Primary Gastroenterologist:  Dr. Cephas Edwards  Pre-Procedure History & Physical: HPI:  Maxwell Edwards is a 65 y.o. male is here for an endoscopy.   Past Medical History:  Diagnosis Date  . CAD (coronary artery disease)   . Heart disease     Past Surgical History:  Procedure Laterality Date  . CORONARY ARTERY BYPASS GRAFT     at 65 years old  . PORTA CATH INSERTION N/A 04/30/2020   Procedure: PORTA CATH INSERTION;  Surgeon: Algernon Huxley, MD;  Location: [facility];  Service: Cardiovascular;  Laterality: N/A;  . VEIN BYPASS SURGERY      Prior to Admission medications   Medication Sig Start Date End Date Taking? Authorizing Provider  fluticasone (FLONASE) 50 MCG/ACT nasal spray Place 1 spray into both nostrils daily.   Yes [provider]  mirtazapine (REMERON) 7.5 MG tablet Take 1 tablet (7.5 mg total) by mouth at bedtime. 04/30/20  Yes Sindy Guadeloupe, MD  OLANZapine (ZYPREXA) 10 MG tablet Take 1 tablet (10 mg total) by mouth at bedtime. 05/07/20  Yes Sindy Guadeloupe, MD  lidocaine-prilocaine (EMLA) cream Apply to affected area once 04/21/20   Sindy Guadeloupe, MD  ondansetron (ZOFRAN) 8 MG tablet Take 1 tablet (8 mg total) by mouth 2 (two) times daily as needed (Nausea or vomiting). 04/21/20   Sindy Guadeloupe, MD  prochlorperazine (COMPAZINE) 10 MG tablet Take 1 tablet (10 mg total) by mouth every 6 (six) hours as needed (Nausea or vomiting). 04/21/20   Sindy Guadeloupe, MD    Allergies as of 04/23/2020  . (No Known Allergies)    Family History  Problem Relation Age of Onset  . Dementia Mother   . Ulcers Father   . Heart attack Brother   . Throat cancer Brother   . Heart attack Daughter   . Diabetes Daughter   . Depression Daughter     Social History   Socioeconomic  History  . Marital status: Divorced    Spouse name: Not on file  . Number of children: 2  . Years of education: Not on file  . Highest education level: Not on file  Occupational History  . 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[employer]  . Smoking status: Current Every Day Smoker    Packs/day: 0.50    Types: Cigarettes  . Smokeless tobacco: Never Used  Vaping Use  . Vaping Use: Never used  Substance and Sexual Activity  . Alcohol use: Not Currently  . Drug use: Not Currently  . Sexual activity: Not Currently  Other Topics Concern  . Not on file  Social History Narrative   Lives by himself    Social Determinants of Health   Financial Resource Strain: Not on file  Food Insecurity: Not on file  Transportation Needs: Not on file  Physical Activity: Not on file  Stress: Not on file  Social Connections: Not on file  Intimate Partner Violence: Not on file    Review of Systems: See HPI, otherwise negative ROS  Physical Exam: BP 105/71   Pulse 97   Temp 99 F (37.2 C) (Temporal)   Resp 17   Ht 5\' 8"  (1.727 m)   Wt 67.6 kg   SpO2 99%   BMI  22.66 kg/m  General:   Alert,  pleasant and cooperative in NAD Head:  Normocephalic and atraumatic. Neck:  Supple; no masses or thyromegaly. Lungs:  Clear throughout to auscultation.    Heart:  Regular rate and rhythm. Abdomen:  Soft, nontender and nondistended. Normal bowel sounds, without guarding, and without rebound.   Neurologic:  Alert and  oriented x4;  grossly normal neurologically.  Impression/Plan: [name] is here for an endoscopy to be performed for h/o cirrhosis, variceal screening  Risks, benefits, limitations, and alternatives regarding  endoscopy have been reviewed with the patient.  Questions have been answered.  All parties agreeable.   Sherri Sear, MD  05/21/2020, 8:17 AM

## 2020-05-22 ENCOUNTER — Telehealth: Payer: Self-pay | Admitting: Primary Care

## 2020-05-22 ENCOUNTER — Encounter: Payer: Self-pay | Admitting: Gastroenterology

## 2020-05-22 LAB — SURGICAL PATHOLOGY

## 2020-05-22 NOTE — Telephone Encounter (Signed)
Called patient to offer to schedule a Palliative Consult, no answer - no voicemail has been set up.  Called patient's daughter, Logan Bores and spoke with her to let her know that I was trying to get in touch with patient and she requested that I contact her Mom, Riaz Onorato, to schedule visit.  Spoke with patient's ex-wife, Kessler Solly, regarding the Palliative referral/services and all questions were answered and she said that patient was in agreement with Palliative services.  Scheduled an In-home Consult for 05/31/20 @ 2 PM.

## 2020-05-23 ENCOUNTER — Encounter: Payer: Self-pay | Admitting: Gastroenterology

## 2020-05-24 ENCOUNTER — Other Ambulatory Visit: Payer: Self-pay | Admitting: *Deleted

## 2020-05-24 ENCOUNTER — Inpatient Hospital Stay: Payer: 59

## 2020-05-24 ENCOUNTER — Encounter: Payer: Self-pay | Admitting: Oncology

## 2020-05-24 ENCOUNTER — Inpatient Hospital Stay (HOSPITAL_BASED_OUTPATIENT_CLINIC_OR_DEPARTMENT_OTHER): Payer: 59 | Admitting: Hospice and Palliative Medicine

## 2020-05-24 ENCOUNTER — Telehealth: Payer: Self-pay | Admitting: *Deleted

## 2020-05-24 ENCOUNTER — Other Ambulatory Visit: Payer: Self-pay

## 2020-05-24 VITALS — BP 115/76 | HR 93 | Temp 99.0°F | Resp 20

## 2020-05-24 VITALS — BP 116/64 | HR 82 | Temp 100.2°F | Resp 18

## 2020-05-24 DIAGNOSIS — R634 Abnormal weight loss: Secondary | ICD-10-CM

## 2020-05-24 DIAGNOSIS — E860 Dehydration: Secondary | ICD-10-CM

## 2020-05-24 DIAGNOSIS — Z515 Encounter for palliative care: Secondary | ICD-10-CM | POA: Diagnosis not present

## 2020-05-24 DIAGNOSIS — Z5112 Encounter for antineoplastic immunotherapy: Secondary | ICD-10-CM | POA: Diagnosis not present

## 2020-05-24 DIAGNOSIS — C220 Liver cell carcinoma: Secondary | ICD-10-CM | POA: Diagnosis not present

## 2020-05-24 LAB — COMPREHENSIVE METABOLIC PANEL
ALT: 139 U/L — ABNORMAL HIGH (ref 0–44)
AST: 226 U/L — ABNORMAL HIGH (ref 15–41)
Albumin: 2.8 g/dL — ABNORMAL LOW (ref 3.5–5.0)
Alkaline Phosphatase: 360 U/L — ABNORMAL HIGH (ref 38–126)
Anion gap: 12 (ref 5–15)
BUN: 20 mg/dL (ref 8–23)
CO2: 23 mmol/L (ref 22–32)
Calcium: 8.4 mg/dL — ABNORMAL LOW (ref 8.9–10.3)
Chloride: 94 mmol/L — ABNORMAL LOW (ref 98–111)
Creatinine, Ser: 0.89 mg/dL (ref 0.61–1.24)
GFR, Estimated: >60 mL/min (ref >60)
Glucose, Bld: 89 mg/dL (ref 70–99)
Potassium: 4.1 mmol/L (ref 3.5–5.1)
Sodium: 129 mmol/L — ABNORMAL LOW (ref 135–145)
Total Bilirubin: 2 mg/dL — ABNORMAL HIGH (ref 0.3–1.2)
Total Protein: 7.3 g/dL (ref 6.5–8.1)

## 2020-05-24 LAB — FERRITIN: Ferritin: >7500 ng/mL — ABNORMAL HIGH (ref 24–336)

## 2020-05-24 LAB — CBC WITH DIFFERENTIAL/PLATELET
Abs Immature Granulocytes: 0.1 10*3/uL — ABNORMAL HIGH (ref 0.00–0.07)
Band Neutrophils: 13 %
Basophils Absolute: 0 10*3/uL (ref 0.0–0.1)
Basophils Relative: 1 %
Eosinophils Absolute: 0 10*3/uL (ref 0.0–0.5)
Eosinophils Relative: 1 %
HCT: 44.4 % (ref 39.0–52.0)
Hemoglobin: 15.4 g/dL (ref 13.0–17.0)
Lymphocytes Relative: 29 %
Lymphs Abs: 1.3 10*3/uL (ref 0.7–4.0)
MCH: 32.3 pg (ref 26.0–34.0)
MCHC: 34.7 g/dL (ref 30.0–36.0)
MCV: 93.1 fL (ref 80.0–100.0)
Metamyelocytes Relative: 2 %
Monocytes Absolute: 0.3 10*3/uL (ref 0.1–1.0)
Monocytes Relative: 6 %
Neutro Abs: 2.8 10*3/uL (ref 1.7–7.7)
Neutrophils Relative %: 48 %
Platelets: 98 10*3/uL — ABNORMAL LOW (ref 150–400)
RBC: 4.77 MIL/uL (ref 4.22–5.81)
RDW: 15 % (ref 11.5–15.5)
Smear Review: NORMAL
WBC: 4.6 10*3/uL (ref 4.0–10.5)
nRBC: 0 % (ref 0.0–0.2)

## 2020-05-24 LAB — IRON AND TIBC
Iron: 165 ug/dL (ref 45–182)
Saturation Ratios: 113 % — ABNORMAL HIGH (ref 17.9–39.5)
TIBC: 146 ug/dL — ABNORMAL LOW (ref 250–450)

## 2020-05-24 MED ORDER — LACTULOSE 10 GM/15ML PO SOLN: 10.0000 g | Freq: Every day | ORAL | 0 refills | Status: DC

## 2020-05-24 MED ORDER — SODIUM CHLORIDE 0.9% FLUSH
10.0000 mL | INTRAVENOUS | Status: DC | PRN
  Administered 2020-05-24: 10 mL via INTRAVENOUS
  Filled 2020-05-24: qty 10

## 2020-05-24 MED ORDER — MIRTAZAPINE 15 MG PO TABS: 15.0000 mg | ORAL_TABLET | Freq: Every day | ORAL | 2 refills | Status: DC

## 2020-05-24 MED ORDER — SODIUM CHLORIDE 0.9 % IV SOLN
Freq: Once | INTRAVENOUS | Status: AC
  Filled 2020-05-24: qty 250

## 2020-05-24 MED ORDER — METHYLPHENIDATE HCL 5 MG PO TABS: 5.0000 mg | ORAL_TABLET | Freq: Every day | ORAL | 0 refills | Status: DC

## 2020-05-24 MED ORDER — HEPARIN SOD (PORK) LOCK FLUSH 100 UNIT/ML IV SOLN
500.0000 [IU] | Freq: Once | INTRAVENOUS | Status: AC
  Administered 2020-05-24: 500 [IU] via INTRAVENOUS
  Filled 2020-05-24: qty 5

## 2020-05-24 NOTE — Telephone Encounter (Signed)
Pt was called by Merrily Pew in palliative care

## 2020-05-24 NOTE — Telephone Encounter (Signed)
Can you reach out to them?

## 2020-05-24 NOTE — Progress Notes (Addendum)
[place]  [phone] [phone]   Name: Maxwell Edwards Date: 05/24/2020 MRN: [id number]  DOB: 1955-11-04  Patient Care Team: Venita Lick, NP as PCP - General (Nurse Practitioner) Clent Jacks, RN as Oncology Nurse Navigator    REASON FOR CONSULTATION: Maxwell Edwards is a 65 y.o. male with multiple medical problems including CAD status post CABG, hypertension, hyperlipidemia, and multifocal locally advanced hepatocellular carcinoma on systemic treatment with Tecentriq and Avastin.  Patient has been symptomatic with nausea, weight loss, and insomnia.  Palliative care was consulted up address goals and manage ongoing symptoms.  SOCIAL HISTORY:     reports that he has been smoking cigarettes. He has been smoking about 0.50 packs per day. He has never used smokeless tobacco. He reports previous alcohol use. He reports previous drug use.  Patient is divorced.  He lives at home alone.  His ex-wife is still involved in his care as are his 2 daughters.  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 CAD (coronary artery disease)   . Heart disease     PAST SURGICAL HISTORY:  Past Surgical History:  Procedure Laterality Date  . CORONARY ARTERY BYPASS GRAFT     at 65 years old  . ESOPHAGOGASTRODUODENOSCOPY (EGD) WITH PROPOFOL N/A 05/21/2020   Procedure: ESOPHAGOGASTRODUODENOSCOPY (EGD) WITH PROPOFOL;  Surgeon: Lin Landsman, MD;  Location: [facility];  Service: Gastroenterology;  Laterality: N/A;  . PORTA CATH INSERTION N/A 04/30/2020   Procedure: PORTA CATH INSERTION;  Surgeon: Algernon Huxley, MD;  Location[facility];  Service: Cardiovascular;  Laterality: N/A;  . VEIN BYPASS SURGERY       HEMATOLOGY/ONCOLOGY HISTORY:  Oncology History  Cancer, hepatocellular ([place])  04/20/2020 Cancer Staging   Staging form: Liver, AJCC 8th Edition - Clinical stage from 04/20/2020: Stage IIIB (cT4, cN0, cM0) - Signed by Sindy Guadeloupe, MD on 04/21/2020   04/21/2020 Initial Diagnosis   Cancer, hepatocellular ([place])   05/10/2020 -  Chemotherapy    Patient is on Treatment Plan: LIVER ATEZOLIZUMAB + BEVACIZUMAB Q21D        ALLERGIES:  has No Known Allergies.  MEDICATIONS:  Current Outpatient Medications  Medication Sig Dispense Refill  . lidocaine-prilocaine (EMLA) cream Apply to affected area once 30 g 3  . methylphenidate (RITALIN) 5 MG tablet Take 1 tablet (5 mg total) by mouth daily with breakfast. 15 tablet 0  . mirtazapine (REMERON) 15 MG tablet Take 1 tablet (15 mg total) by mouth at bedtime. 30 tablet 2  . OLANZapine (ZYPREXA) 10 MG tablet Take 1 tablet (10 mg total) by mouth at bedtime. 30 tablet 1  . ondansetron (ZOFRAN) 8 MG tablet Take 1 tablet (8 mg total) by mouth 2 (two) times daily as needed (Nausea or vomiting). 30 tablet 1  . prochlorperazine (COMPAZINE) 10 MG tablet Take 1 tablet (10 mg total) by mouth every 6 (six) hours as needed (Nausea or vomiting). 30 tablet 1  . omeprazole (PRILOSEC) 40 MG capsule Take 1 capsule (40 mg total) by mouth daily before breakfast. (Patient not taking: Reported on 05/24/2020) 30 capsule 6   No current facility-administered medications for this visit.   Facility-Administered Medications Ordered in Other Visits  Medication Dose Route Frequency Provider Last Rate Last Admin  .  sodium chloride flush (NS) 0.9 % injection 10 mL  10 mL Intravenous PRN Sindy Guadeloupe, MD   10 mL at 05/24/20 1348    VITAL SIGNS: BP 115/76   Pulse 93   Temp 99 F (37.2 C) (Oral)   Resp 20   SpO2 100%  Filed Weights    Estimated body mass index is 22.66 kg/m as calculated from the following:   Height as of 05/21/20: 5\' 8"  (1.727 m).   Weight as of  05/21/20: 149 lb (67.6 kg).  LABS: CBC:    Component Value Date/Time   WBC 4.6 05/24/2020 1337   HGB 15.4 05/24/2020 1337   HGB 18.3 (H) 04/06/2020 1152   HCT 44.4 05/24/2020 1337   HCT 53.8 (H) 04/06/2020 1152   PLT 98 (L) 05/24/2020 1337   PLT 247 04/06/2020 1152   MCV 93.1 05/24/2020 1337   MCV 94 04/06/2020 1152   NEUTROABS 2.8 05/24/2020 1337   NEUTROABS 7.1 (H) 04/06/2020 1152   LYMPHSABS 1.3 05/24/2020 1337   LYMPHSABS 2.1 04/06/2020 1152   MONOABS 0.3 05/24/2020 1337   EOSABS 0.0 05/24/2020 1337   EOSABS 0.1 04/06/2020 1152   BASOSABS 0.0 05/24/2020 1337   BASOSABS 0.1 04/06/2020 1152   Comprehensive Metabolic Panel:    Component Value Date/Time   NA 129 (L) 05/24/2020 1337   NA 135 04/06/2020 1152   K 4.1 05/24/2020 1337   CL 94 (L) 05/24/2020 1337   CO2 23 05/24/2020 1337   BUN 20 05/24/2020 1337   BUN 15 04/06/2020 1152   CREATININE 0.89 05/24/2020 1337   GLUCOSE 89 05/24/2020 1337   CALCIUM 8.4 (L) 05/24/2020 1337   AST 226 (H) 05/24/2020 1337   ALT 139 (H) 05/24/2020 1337   ALKPHOS 360 (H) 05/24/2020 1337   BILITOT 2.0 (H) 05/24/2020 1337   BILITOT 0.6 04/06/2020 1152   PROT 7.3 05/24/2020 1337   PROT 8.2 04/06/2020 1152   ALBUMIN 2.8 (L) 05/24/2020 1337   ALBUMIN 4.3 04/06/2020 1152    RADIOGRAPHIC STUDIES: PERIPHERAL VASCULAR CATHETERIZATION  Result Date: 04/30/2020 See op note   PERFORMANCE STATUS (ECOG) : 1 - Symptomatic but completely ambulatory  Review of Systems Unless otherwise noted, a complete review of systems is negative.  Physical Exam General: NAD HEENT: No trauma to head, clear OP without exudate or thrush, no cervical lymphadenopathy Pulmonary: Clear to auscultation anterior/posterior fields Abdomen: soft, nontender, bowel sounds positive GU: no suprapubic tenderness Extremities: no edema, no joint deformities Skin: no rashes Neurological: Weakness but otherwise nonfocal  IMPRESSION: Patient with an acute add-on to my  clinic schedule today due to progressive weakness and poor oral intake.  Patient says that he has had minimal appreciable intake over the past week.  He has had nothing to eat today.  He is subsisting on 1 or 2 ensures daily.  Patient says he has had several falls without sustaining injury.  He fell last night in the bathroom and laid on the floor for a couple of hours before help could arrive.  He denies sustaining any injury.  He says he has had progressive generalized weakness.  He is sleeping 12 to 14 hours a day and laying in bed the rest of the day when he is awake.  Patient denies any symptomatic complaints today other than fatigue.  He denies fever or chills.  No nausea, vomiting, diarrhea, constipation.  No UTI symptoms.  No peripheral edema.  Labs are grossly unchanged with exception of hyponatremia likely related  to dehydration.  Patient is pending iron studies.  It is possible that his hemochromatosis could be exacerbating fatigue. Will await results of labs and consider therapeutic phlebotomy if needed.  It is also possible that patient could be experiencing profound fatigue from his [facility] or treatment.  Will empirically start lactulose.  I gave him a liter of fluids today in the clinic.  He was found to have a temperature of 99.5 but no leukocytosis.  Patient was not interested in hospitalization.  Case discussed at length with Dr. Janese Banks.  I had a lengthy conversation with patient and ex-wife regarding goals.  Patient recognizes that he is doing poorly.  However, he says that he still has the desire to pursue treatment and is not ready to "give up."  He does seem to recognize that if he continues to decline he may become ineligible to receive chemotherapy at which point hospice would likely be recommended.  Will refer to home health PT/OT. Will order home equipment including BSC and walker. Family has a shower chair.   Patient continues to become tearful when he is asked about  depression.  We will plan to increase the dose of the mirtazapine to 15 mg at bedtime and start patient on methylphenidate in the morning.  Hopefully this combination will improve fatigue and minimize appetite suppression.  We will plan to bring patient back to the clinic tomorrow for repeat labs and fluids.  Patient's ex-wife will stay with him tonight.  She will monitor his temperature and knows to call on-call or 911 in the event of decline or significant clinical change.  Patient's ACP documents are on file.  We discussed CODE STATUS today.  Patient stated clearly and repeatedly that he would not want to be resuscitated or have his life prolonged artificially machines.  He was in agreement with DNR/DNI.  I sent him home with a DNR order.     Durable Medical Equipment  (From admission, onward)         Start     Ordered   05/24/20 0000  DME Bedside commode       Question:  Patient needs a bedside commode to treat with the following condition  Answer:  Weakness   05/24/20 1602   05/24/20 0000  For home use only DME 4 wheeled rolling walker with seat       Question:  Patient needs a walker to treat with the following condition  Answer:  Weakness   05/24/20 1602          PLAN: -Continue current scope of treatment -Increase mirtazapine 15mg  nightly -Start methylphenidate 5 mg daily in a.m. -Start lactulose 7mL daily. Titrate for effect with goal of one BM daily.  -DME: Gilford Rile and BSC (Zack Blank notified) -Referral for home health PT/OT due to weakness -DNR form signed -RTC tomorrow. Recheck CBC, CMP  Case and plan discussed with Dr. Janese Banks  Patient expressed understanding and was in agreement with this plan. He also understands that He can call the clinic at any time with any questions, concerns, or complaints.     Time Total: 45 minutes  Visit consisted of counseling and education dealing with the complex and emotionally intense issues of symptom management and palliative  care in the setting of serious and potentially life-threatening illness.Greater than 50%  of this time was spent counseling and coordinating care related to the above assessment and plan.  Signed by: Altha Harm, PhD, NP-C

## 2020-05-24 NOTE — Telephone Encounter (Signed)
I called and spoke with Neoma Laming.  She reports that patient has had profound decline in performance status over the past week.  H his oral intake is poor.  He is only drinking sips over the past several days.  She denies any other significant symptomatic complaints such as fever, chills, pain, shortness of breath, nausea or vomiting, diarrhea or constipation.  She says that he just wants to lay in bed and be left alone.  However, she still says that patient is interested in pursuing further treatment.  We discussed options including hospice versus clinic eval versus ER.  She would like patient seen in the clinic.  We will arrange to see him this afternoon.  Case and plan discussed with Dr. Janese Banks.

## 2020-05-24 NOTE — Telephone Encounter (Signed)
Pt also came in today and got labs and fluids and he also has appt for tomorrow

## 2020-05-24 NOTE — Telephone Encounter (Signed)
Maxwell Edwards called wanting an answer to her My CHart messages from yesterday and today. He is weak and unable to walk and is not taking in much orally. Please advise

## 2020-05-24 NOTE — Addendum Note (Signed)
Addended by: Irean Hong on: 05/24/2020 04:30 PM   Modules accepted: Orders

## 2020-05-25 ENCOUNTER — Encounter: Payer: Self-pay | Admitting: Nurse Practitioner

## 2020-05-25 ENCOUNTER — Inpatient Hospital Stay (HOSPITAL_BASED_OUTPATIENT_CLINIC_OR_DEPARTMENT_OTHER): Payer: 59 | Admitting: Nurse Practitioner

## 2020-05-25 ENCOUNTER — Inpatient Hospital Stay: Payer: 59

## 2020-05-25 ENCOUNTER — Encounter: Payer: Self-pay | Admitting: Oncology

## 2020-05-25 ENCOUNTER — Telehealth: Payer: Self-pay | Admitting: *Deleted

## 2020-05-25 VITALS — BP 100/68 | HR 86

## 2020-05-25 DIAGNOSIS — C220 Liver cell carcinoma: Secondary | ICD-10-CM

## 2020-05-25 DIAGNOSIS — E860 Dehydration: Secondary | ICD-10-CM

## 2020-05-25 DIAGNOSIS — Z5112 Encounter for antineoplastic immunotherapy: Secondary | ICD-10-CM | POA: Diagnosis not present

## 2020-05-25 DIAGNOSIS — D751 Secondary polycythemia: Secondary | ICD-10-CM

## 2020-05-25 LAB — CBC WITH DIFFERENTIAL/PLATELET
Abs Immature Granulocytes: 0.02 10*3/uL (ref 0.00–0.07)
Basophils Absolute: 0 10*3/uL (ref 0.0–0.1)
Basophils Relative: 1 %
Eosinophils Absolute: 0 10*3/uL (ref 0.0–0.5)
Eosinophils Relative: 1 %
HCT: 41.4 % (ref 39.0–52.0)
Hemoglobin: 14.3 g/dL (ref 13.0–17.0)
Immature Granulocytes: 1 %
Lymphocytes Relative: 30 %
Lymphs Abs: 1.2 10*3/uL (ref 0.7–4.0)
MCH: 32.6 pg (ref 26.0–34.0)
MCHC: 34.5 g/dL (ref 30.0–36.0)
MCV: 94.3 fL (ref 80.0–100.0)
Monocytes Absolute: 0.4 10*3/uL (ref 0.1–1.0)
Monocytes Relative: 9 %
Neutro Abs: 2.4 10*3/uL (ref 1.7–7.7)
Neutrophils Relative %: 58 %
Platelets: 72 10*3/uL — ABNORMAL LOW (ref 150–400)
RBC: 4.39 MIL/uL (ref 4.22–5.81)
RDW: 15.1 % (ref 11.5–15.5)
Smear Review: NORMAL
WBC: 4 10*3/uL (ref 4.0–10.5)
nRBC: 0 % (ref 0.0–0.2)

## 2020-05-25 LAB — COMPREHENSIVE METABOLIC PANEL
ALT: 125 U/L — ABNORMAL HIGH (ref 0–44)
AST: 186 U/L — ABNORMAL HIGH (ref 15–41)
Albumin: 2.5 g/dL — ABNORMAL LOW (ref 3.5–5.0)
Alkaline Phosphatase: 299 U/L — ABNORMAL HIGH (ref 38–126)
Anion gap: 9 (ref 5–15)
BUN: 18 mg/dL (ref 8–23)
CO2: 23 mmol/L (ref 22–32)
Calcium: 8.1 mg/dL — ABNORMAL LOW (ref 8.9–10.3)
Chloride: 96 mmol/L — ABNORMAL LOW (ref 98–111)
Creatinine, Ser: 0.94 mg/dL (ref 0.61–1.24)
GFR, Estimated: >60 mL/min (ref >60)
Glucose, Bld: 143 mg/dL — ABNORMAL HIGH (ref 70–99)
Potassium: 4 mmol/L (ref 3.5–5.1)
Sodium: 128 mmol/L — ABNORMAL LOW (ref 135–145)
Total Bilirubin: 1.9 mg/dL — ABNORMAL HIGH (ref 0.3–1.2)
Total Protein: 6.7 g/dL (ref 6.5–8.1)

## 2020-05-25 LAB — TSH: TSH: 2.561 u[IU]/mL (ref 0.350–4.500)

## 2020-05-25 MED ORDER — HEPARIN SOD (PORK) LOCK FLUSH 100 UNIT/ML IV SOLN
500.0000 [IU] | Freq: Once | INTRAVENOUS | Status: AC
  Administered 2020-05-25: 500 [IU] via INTRAVENOUS
  Filled 2020-05-25: qty 5

## 2020-05-25 MED ORDER — SODIUM CHLORIDE 0.9 % IV SOLN
INTRAVENOUS | Status: DC
  Filled 2020-05-25 (×2): qty 250

## 2020-05-25 NOTE — Progress Notes (Signed)
Performed 500 ml phlebotomy via PIV in L AC, pt tolerated very well. He received 1 liter of NS through his port. Pt denies pain or any dizziness at time of discharge. Accompanied by his ex wife. Pushed to lobby in wheelchair.

## 2020-05-25 NOTE — Telephone Encounter (Signed)
Received incoming call from [place] with [facility]. [facility] is not able to service the patient since he has [facility]. He has fwd the referral to [facility]. It may be 3-5 days before he can be serviced.

## 2020-05-25 NOTE — Progress Notes (Signed)
Symptom Management [place]  [phone] [phone]  Patient Care Team: Venita Lick, NP as PCP - General (Nurse Practitioner) Clent Jacks, RN as Oncology Nurse Navigator   Name of the patient: Maxwell Edwards  [id number]  [birth year]   Date of visit: 05/25/20  Diagnosis-hepatocellular carcinoma  Chief complaint/ Reason for visit- performance decline, fatigue  Heme/Onc history:  Oncology History  Cancer, hepatocellular ([address])  04/20/2020 Cancer Staging   Staging form: Liver, AJCC 8th Edition - Clinical stage from 04/20/2020: Stage IIIB (cT4, cN0, cM0) - Signed by Sindy Guadeloupe, MD on 04/21/2020   04/21/2020 Initial Diagnosis   Cancer, hepatocellular ([place])   05/10/2020 -  Chemotherapy    Patient is on Treatment Plan: LIVER ATEZOLIZUMAB + BEVACIZUMAB Q21D        Interval history-patient is 65 year old male with above history of hepatocellular carcinoma.  He was seen in symptom management clinic yesterday for concerns from family for profound decline and poor performance status over the past week, poor oral intake, fatigue.  He subsisting on 1-2 ensures per day.  Multiple falls.  Progressive generalized weakness.  Labs were drawn and he was found to have hemochromatosis and iron overload which was thought to possibly be exacerbating fatigue.  Hyponatremia likely secondary to dehydration.  He received IV fluids.  Noted to have depression and mirtazapine was increased and started on methylphenidate.  He met with palliative care and expressed desire to continue treatment but declined hospitalization.  Advanced directives were signed.  He was referred to home health and walker and BSC were ordered.  Patient returns to clinic today for re-evaluation. Has not taken lactulose or mirtazapine.  Fatigue is slightly better.  Continues to have very poor oral intake.  Review of systems- Review of Systems  Constitutional: Positive for  malaise/fatigue and weight loss. Negative for chills and fever.  HENT: Negative for hearing loss, nosebleeds, sore throat and tinnitus.   Eyes: Negative for blurred vision and double vision.  Respiratory: Negative for cough, hemoptysis, shortness of breath and wheezing.   Cardiovascular: Negative for chest pain, palpitations and leg swelling.  Gastrointestinal: Negative for abdominal pain, blood in stool, constipation, diarrhea, melena, nausea and vomiting.       Early satiety & taste changes  Genitourinary: Negative for dysuria and urgency.  Musculoskeletal: Positive for myalgias. Negative for back pain, falls and joint pain.  Skin: Negative for itching and rash.  Neurological: Positive for weakness. Negative for dizziness, tingling, sensory change, loss of consciousness and headaches.  Endo/Heme/Allergies: Negative for environmental allergies. Does not bruise/bleed easily.  Psychiatric/Behavioral: Positive for depression. The patient is not nervous/anxious and does not have insomnia.      Current treatment- s/p cycle 1 of atezolizumab and bevacizumab on 05/10/2020  No Known Allergies  Past Medical History:  Diagnosis Date  . CAD (coronary artery disease)   . Heart disease     Past Surgical History:  Procedure Laterality Date  . CORONARY ARTERY BYPASS GRAFT     at 65 years old  . ESOPHAGOGASTRODUODENOSCOPY (EGD) WITH PROPOFOL N/A 05/21/2020   Procedure: ESOPHAGOGASTRODUODENOSCOPY (EGD) WITH PROPOFOL;  Surgeon: Lin Landsman, MD;  Location: [facility];  Service: Gastroenterology;  Laterality: N/A;  . PORTA CATH INSERTION N/A 04/30/2020   Procedure: PORTA CATH INSERTION;  Surgeon: Algernon Huxley, MD;  Location: [facility];  Service: Cardiovascular;  Laterality: N/A;  . VEIN BYPASS SURGERY      Social History   Socioeconomic  History  . Marital status: Divorced    Spouse name: Not on file  . Number of children: 2  . Years of education: Not on file  . Highest  education level: Not on file  Occupational History  . 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[employer]  . Smoking status: Current Every Day Smoker    Packs/day: 0.50    Types: Cigarettes  . Smokeless tobacco: Never Used  Vaping Use  . Vaping Use: Never used  Substance and Sexual Activity  . Alcohol use: Not Currently  . Drug use: Not Currently  . Sexual activity: Not Currently  Other Topics Concern  . Not on file  Social History Narrative   Lives by himself    Social Determinants of Health   Financial Resource Strain: Not on file  Food Insecurity: Not on file  Transportation Needs: Not on file  Physical Activity: Not on file  Stress: Not on file  Social Connections: Not on file  Intimate Partner Violence: Not on file    Family History  Problem Relation Age of Onset  . Dementia Mother   . Ulcers Father   . Heart attack Brother   . Throat cancer Brother   . Heart attack Daughter   . Diabetes Daughter   . Depression Daughter      Current Outpatient Medications:  .  lactulose (CHRONULAC) 10 GM/15ML solution, Take 15 mLs (10 g total) by mouth daily., Disp: 236 mL, Rfl: 0 .  lidocaine-prilocaine (EMLA) cream, Apply to affected area once, Disp: 30 g, Rfl: 3 .  methylphenidate (RITALIN) 5 MG tablet, Take 1 tablet (5 mg total) by mouth daily with breakfast., Disp: 15 tablet, Rfl: 0 .  mirtazapine (REMERON) 15 MG tablet, Take 1 tablet (15 mg total) by mouth at bedtime., Disp: 30 tablet, Rfl: 2 .  OLANZapine (ZYPREXA) 10 MG tablet, Take 1 tablet (10 mg total) by mouth at bedtime., Disp: 30 tablet, Rfl: 1 .  omeprazole (PRILOSEC) 40 MG capsule, Take 1 capsule (40 mg total) by mouth daily before breakfast. (Patient not taking: Reported on 05/24/2020), Disp: 30 capsule, Rfl: 6 .  ondansetron (ZOFRAN) 8 MG tablet, Take 1 tablet (8 mg total) by mouth 2 (two) times daily as needed (Nausea or vomiting)., Disp: 30 tablet, Rfl: 1 .  prochlorperazine (COMPAZINE) 10 MG  tablet, Take 1 tablet (10 mg total) by mouth every 6 (six) hours as needed (Nausea or vomiting)., Disp: 30 tablet, Rfl: 1  Physical exam:  Vitals:   05/25/20 0849  Temp: (!) 100.7 F (38.2 C)  TempSrc: Tympanic   Physical Exam Constitutional:      General: He is not in acute distress.    Appearance: He is well-developed.     Comments: Accompanied by ex-wife who provides majority of history  HENT:     Head: Normocephalic and atraumatic.     Mouth/Throat:     Mouth: Mucous membranes are dry.  Eyes:     General: Scleral icterus present.  Cardiovascular:     Rate and Rhythm: Normal rate and regular rhythm.     Heart sounds: Normal heart sounds.  Pulmonary:     Effort: Pulmonary effort is normal.     Breath sounds: No wheezing.  Abdominal:     General: There is no distension.     Palpations: Abdomen is soft.     Tenderness: There is abdominal tenderness. There is no guarding.     Comments: Generalized fullness. No  obvious ascites.   Musculoskeletal:        General: No deformity. Normal range of motion.  Skin:    General: Skin is warm and dry.     Coloration: Skin is jaundiced.  Neurological:     General: No focal deficit present.     Mental Status: He is alert and oriented to person, place, and time.  Psychiatric:        Mood and Affect: Mood normal.        Behavior: Behavior normal.     Comments: Punctuated speech. Limited details provided. Hat pulled down low over eyes      CMP Latest Ref Rng & Units 05/24/2020  Glucose 70 - 99 mg/dL 89  BUN 8 - 23 mg/dL 20  Creatinine 0.61 - 1.24 mg/dL 0.89  Sodium 135 - 145 mmol/L 129(L)  Potassium 3.5 - 5.1 mmol/L 4.1  Chloride 98 - 111 mmol/L 94(L)  CO2 22 - 32 mmol/L 23  Calcium 8.9 - 10.3 mg/dL 8.4(L)  Total Protein 6.5 - 8.1 g/dL 7.3  Total Bilirubin 0.3 - 1.2 mg/dL 2.0(H)  Alkaline Phos 38 - 126 U/L 360(H)  AST 15 - 41 U/L 226(H)  ALT 0 - 44 U/L 139(H)   CBC Latest Ref Rng & Units 05/24/2020  WBC 4.0 - 10.5 K/uL 4.6   Hemoglobin 13.0 - 17.0 g/dL 15.4  Hematocrit 39.0 - 52.0 % 44.4  Platelets 150 - 400 K/uL 98(L)    No images are attached to the encounter.  PERIPHERAL VASCULAR CATHETERIZATION  Result Date: 04/30/2020 See op note   Assessment and plan- Patient is a 65 y.o. male diagnosed with hepatocellular carcinoma currently status post cycle 1 Tecentriq and Avastin chemotherapy who presents to symptom management clinic for progressive fatigue and malnutrition.   1. Progressive fatigue- question if secondary to underlying iron overload and cancer. Dr. Janese Banks to treat with therapeutic phlebotomy for hereditary hemochromatosis.   2. Encouraged nutrition and oral intake. Encouraged remeron for sleep and appetite. Encouraged methylphenidate for fatigue.   3. Iron overload- secondary to hereditary hemochromatosis (see Dr. Elroy Channel note). Proceed with therapeutic phlebotomy today. Plan for IV fluids prior to and post for fluid replenishment. No anemia on labs today. Plan for therapeutic phlebotomy with fluids twice a week to coordinate with other appointments and visits.   Return to symptom management clinic as needed for acute concerns. Follow up with palliative care as scheduled.    Visit Diagnosis 1. Iron overload   2. Cancer, hepatocellular ([place])   3. Hereditary hemochromatosis ([place])     Patient expressed understanding and was in agreement with this plan. He also understands that He can call clinic at any time with any questions, concerns, or complaints.   Thank you for allowing me to participate in the care of this very pleasant patient.   Beckey Rutter, DNP, AGNP-C [facility]  CC: Dr. Janese Banks

## 2020-05-25 NOTE — Addendum Note (Signed)
Addended by: Randa Evens C on: 05/25/2020 03:48 PM   Modules accepted: Level of Service

## 2020-05-25 NOTE — Patient Instructions (Signed)
Hemochromatosis Hemochromatosis is a condition in which the body stores too much iron. This is also called iron storage disease or iron overload disorder. The extra iron builds up in your joints, heart, liver, pancreas, and other organs, where it can cause damage. There are two forms of this condition; they include:  Hereditary hemochromatosis. Defects (mutations) on certain genes can cause symptoms to develop. With this type of the condition, the body absorbs more iron than it needs from the foods you eat.  Secondary hemochromatosis. With this type of the condition, iron builds up in the body due to other reasons, such as from liver disease or blood transfusions. What are the causes? This condition may be caused by:  Abnormal genes passed down from both parents (inherited).  Receiving blood from a donor (blood transfusion).  Problems with the way the body uses iron in the bone marrow (ineffective erythropoiesis).  Having chronic liver disease, such as hepatitis or liver cancer. What increases the risk? You are more likely to develop this condition if you:  Inherit certain abnormal gene mutations from both parents.  Are white (Caucasian).  Have severe or long-term (chronic) anemia. What are the signs or symptoms? Signs and symptoms can start at any age, but they usually start in middle age. They may include:  Fatigue.  Weakness.  Joint pain and stiffness.  Abdominal pain.  Weight loss.  Skin turning a gray or bronze color.  Loss of interest in sex.  Loss of menstrual periods, in women.  Loss of body hair.  Shortness of breath. As hemochromatosis gets worse, it may damage the liver, heart, or pancreas. This may lead to complications such as:  Diabetes.  Liver cancer.  Abnormal heart rhythms.  Heart failure. How is this diagnosed? This condition may be diagnosed based on:  Your symptoms and medical history.  A physical exam.  Blood tests.  Genetic  testing.  Removal and testing of a sample of liver tissue (liver biopsy). How is this treated? This condition is most often treated with:  Therapeutic phlebotomy. In this procedure, some of your blood is removed periodically in order to reduce the amount of iron in your body. Over time, your body will naturally replace the blood cells that you lose during phlebotomy. At the start of treatment, you may have a unit of blood removed once or twice a week. You will have blood tests during this time to determine when your iron levels return to normal. Once your iron levels are normal, you may only need to have a phlebotomy every few months.  Lifestyle changes. This may include not drinking alcohol and limiting certain items in your diet.  Medicines to remove excess iron (chelation therapy).  Medicines to help treat any related conditions.  Genetic counseling. If you are found to have a gene mutation, other family members may need to be tested for hereditary hemochromatosis.   IMPLICATIONS FOR THE FAMILY Hereditary hemochromatosis is almost always caused by a gene mutation that is passed from both parents to a child. Therefore, clinicians usually recommend that first-degree relatives (parents, siblings, and children) of people with hemochromatosis undergo testing. There is a 25 percent chance that a full brother or sister of a person with one copy of the HFE C282Y mutation from each parent will also have inherited two copies, provided that both of the parents only carry one copy of the mutation. The primary goal of screening is to detect hereditary hemochromatosis before there are symptoms or complications. Screening is  generally not needed until adulthood, when the individual can give informed consent for testing. The optimal age is between 66 and 35 years; during this time, the condition can be detected, but serious tissue damage has not yet occurred. The optimal strategy for screening is by serum iron  studies. If these are abnormal, then genetic testing can be performed. It is important to discuss the advantages and disadvantages of family screening with a healthcare provider. Blood tests -- Screening for hemochromatosis includes blood tests to determine a person's transferrin saturation and ferritin levels. (See 'Blood tests' above.) Genetic tests -- Genetic tests are also commonly performed if iron levels are elevated or if there is a family history of hemochromatosis. This testing may not be necessary for all first-degree relatives; the genetic profile of the affected family member may indicate which relatives should be tested. If both parents are known to be carrying the HFE C282Y mutation, the child is generally tested at some point. Your doctor can talk with you about the best age to perform the testing. The name of the test your doctor should order for family members is called:  *Hemochromatosis DNA-PCR(c282y,h63d). This test will check to see if you have any of the genetic mutations linked to hereditary hemochromatosis. *  Follow these instructions at home: Alcohol use  If you have liver damage, do not drink alcohol.  If you do not have liver damage: ? Do not drink alcohol if:  Your health care provider tells you not to drink.  You are pregnant, may be pregnant, or are planning to become pregnant. ? If you drink alcohol, limit how much you have:  0-1 drink a day for women.  0-2 drinks a day for men. ? Be aware of how much alcohol is in your drink. In the [place] one drink equals one typical bottle of beer (12 oz), one-half glass of wine (5 oz), or one shot of hard liquor (1 oz).   General instructions  Stay active. Exercise for at least 30 minutes on most days of the week.  Take over-the-counter and prescription medicines only as told by your health care provider. This includes vitamins and supplements.  You may need to have frequent blood or urine testing to monitor your  condition and to look for complications.  Follow any instructions as directed by your health care provider regarding dietary restrictions.  Do not: ? Take vitamins or supplements that contain iron. ? Take vitamin C supplements. Vitamin C makes your body absorb more iron from foods. ? Eat raw shellfish or raw fish. Hemochromatosis may increase your chance for infection from these foods.  Keep all follow-up visits as told by your health care provider. This is important. Contact a health care provider if you have:  Fatigue.  Unusual weakness.  Shortness of breath.  Joint pain.  Abdominal pain.  Weight loss. Get help right away if you have:  Chest pain.  Trouble breathing. Summary  Hemochromatosis is a condition in which your body stores too much iron. This is also called iron storage disease or iron overload disorder.  The extra iron builds up in your joints, heart, liver, pancreas, and other organs, where it can cause damage.  Symptoms of hemochromatosis usually appear after age 64 and may include liver enlargement (figure 1), weakness, infections, darkened skin, and joint pain. People with hemochromatosis are more likely to develop heart disease, diabetes mellitus, thyroid problems, and hormonal changes (eg, bone thinning, irregular or missed menstrual periods, sexual problems in men).  Tests are available to diagnose hemochromatosis. Family members (eg, parents, siblings, children) of people with hereditary hemochromatosis should also be tested. Early testing and treatment can help to prevent complications.  To treat this condition, you will need to have some of your blood removed periodically (therapeutic phlebotomy). Removing some of your blood also removes iron from your body  You may need to have frequent blood or urine testing to monitor your condition and to look for complications.  People with hemochromatosis do not need to follow a special diet. Iron supplements and  vitamin C supplements should be avoided. Drinking alcohol is not safe. Raw fish products should not be consumed. This information is not intended to replace advice given to you by your health care provider. Make sure you discuss any questions you have with your health care provider.

## 2020-05-28 ENCOUNTER — Telehealth: Payer: Self-pay | Admitting: Oncology

## 2020-05-28 NOTE — Telephone Encounter (Signed)
The only time she comes for free is on wed. If he gets ref. To PT then he would need referral and it will need to be put in and then it will get scheduled and it will be at [facility] and charge for it

## 2020-05-28 NOTE — Telephone Encounter (Signed)
Called to state hard for pt to get here for chemo and wanted to change Maureen appts. Told them she was only here on WED and I could move it to another day but she said not just cancel the appt. I didn't know if there was another way for him to see Gwenette Greet.

## 2020-05-29 ENCOUNTER — Inpatient Hospital Stay: Payer: 59

## 2020-05-29 ENCOUNTER — Encounter: Payer: Self-pay | Admitting: Oncology

## 2020-05-29 ENCOUNTER — Inpatient Hospital Stay (HOSPITAL_BASED_OUTPATIENT_CLINIC_OR_DEPARTMENT_OTHER): Payer: 59 | Admitting: Oncology

## 2020-05-29 ENCOUNTER — Other Ambulatory Visit: Payer: Self-pay | Admitting: Oncology

## 2020-05-29 ENCOUNTER — Inpatient Hospital Stay (HOSPITAL_BASED_OUTPATIENT_CLINIC_OR_DEPARTMENT_OTHER): Payer: 59 | Admitting: Hospice and Palliative Medicine

## 2020-05-29 ENCOUNTER — Telehealth: Payer: Self-pay | Admitting: Oncology

## 2020-05-29 VITALS — BP 113/85 | HR 100 | Temp 97.4°F | Wt 145.2 lb

## 2020-05-29 VITALS — BP 102/71 | HR 90

## 2020-05-29 DIAGNOSIS — C220 Liver cell carcinoma: Secondary | ICD-10-CM | POA: Diagnosis not present

## 2020-05-29 DIAGNOSIS — Z515 Encounter for palliative care: Secondary | ICD-10-CM

## 2020-05-29 DIAGNOSIS — Z5112 Encounter for antineoplastic immunotherapy: Secondary | ICD-10-CM

## 2020-05-29 LAB — CBC WITH DIFFERENTIAL/PLATELET
Abs Immature Granulocytes: 0.02 10*3/uL (ref 0.00–0.07)
Basophils Absolute: 0 10*3/uL (ref 0.0–0.1)
Basophils Relative: 1 %
Eosinophils Absolute: 0.1 10*3/uL (ref 0.0–0.5)
Eosinophils Relative: 1 %
HCT: 38.5 % — ABNORMAL LOW (ref 39.0–52.0)
Hemoglobin: 13 g/dL (ref 13.0–17.0)
Immature Granulocytes: 0 %
Lymphocytes Relative: 48 %
Lymphs Abs: 2.8 10*3/uL (ref 0.7–4.0)
MCH: 31.8 pg (ref 26.0–34.0)
MCHC: 33.8 g/dL (ref 30.0–36.0)
MCV: 94.1 fL (ref 80.0–100.0)
Monocytes Absolute: 0.4 10*3/uL (ref 0.1–1.0)
Monocytes Relative: 7 %
Neutro Abs: 2.6 10*3/uL (ref 1.7–7.7)
Neutrophils Relative %: 43 %
Platelets: 116 10*3/uL — ABNORMAL LOW (ref 150–400)
RBC: 4.09 MIL/uL — ABNORMAL LOW (ref 4.22–5.81)
RDW: 15.5 % (ref 11.5–15.5)
Smear Review: NORMAL
WBC: 5.9 10*3/uL (ref 4.0–10.5)
nRBC: 0 % (ref 0.0–0.2)

## 2020-05-29 LAB — URINALYSIS, DIPSTICK ONLY
Bilirubin Urine: NEGATIVE
Glucose, UA: NEGATIVE mg/dL
Ketones, ur: NEGATIVE mg/dL
Leukocytes,Ua: NEGATIVE
Nitrite: NEGATIVE
Protein, ur: 30 mg/dL — AB
Specific Gravity, Urine: 1.017 (ref 1.005–1.030)
pH: 5 (ref 5.0–8.0)

## 2020-05-29 LAB — COMPREHENSIVE METABOLIC PANEL
ALT: 102 U/L — ABNORMAL HIGH (ref 0–44)
AST: 129 U/L — ABNORMAL HIGH (ref 15–41)
Albumin: 2.6 g/dL — ABNORMAL LOW (ref 3.5–5.0)
Alkaline Phosphatase: 304 U/L — ABNORMAL HIGH (ref 38–126)
Anion gap: 9 (ref 5–15)
BUN: 16 mg/dL (ref 8–23)
CO2: 25 mmol/L (ref 22–32)
Calcium: 8.4 mg/dL — ABNORMAL LOW (ref 8.9–10.3)
Chloride: 99 mmol/L (ref 98–111)
Creatinine, Ser: 0.78 mg/dL (ref 0.61–1.24)
GFR, Estimated: >60 mL/min (ref >60)
Glucose, Bld: 149 mg/dL — ABNORMAL HIGH (ref 70–99)
Potassium: 3.7 mmol/L (ref 3.5–5.1)
Sodium: 133 mmol/L — ABNORMAL LOW (ref 135–145)
Total Bilirubin: 1.3 mg/dL — ABNORMAL HIGH (ref 0.3–1.2)
Total Protein: 6.9 g/dL (ref 6.5–8.1)

## 2020-05-29 MED ORDER — SODIUM CHLORIDE 0.9% FLUSH
10.0000 mL | INTRAVENOUS | Status: DC | PRN
  Filled 2020-05-29: qty 10

## 2020-05-29 MED ORDER — SODIUM CHLORIDE 0.9 % IV SOLN
1200.0000 mg | Freq: Once | INTRAVENOUS | Status: AC
  Administered 2020-05-29: 1200 mg via INTRAVENOUS
  Filled 2020-05-29: qty 20

## 2020-05-29 MED ORDER — BEVACIZUMAB CHEMO INJECTION 400 MG/16ML
15.0000 mg/kg | Freq: Once | INTRAVENOUS | Status: DC
  Filled 2020-05-29: qty 40

## 2020-05-29 MED ORDER — HEPARIN SOD (PORK) LOCK FLUSH 100 UNIT/ML IV SOLN
500.0000 [IU] | Freq: Once | INTRAVENOUS | Status: AC | PRN
  Administered 2020-05-29: 500 [IU]
  Filled 2020-05-29: qty 5

## 2020-05-29 MED ORDER — SODIUM CHLORIDE 0.9 % IV SOLN
15.0000 mg/kg | Freq: Once | INTRAVENOUS | Status: AC
  Administered 2020-05-29: 1000 mg via INTRAVENOUS
  Filled 2020-05-29: qty 32

## 2020-05-29 MED ORDER — SODIUM CHLORIDE 0.9 % IV SOLN
Freq: Once | INTRAVENOUS | Status: AC
  Filled 2020-05-29: qty 250

## 2020-05-29 MED ORDER — SODIUM CHLORIDE 0.9 % IV SOLN
Freq: Once | INTRAVENOUS | Status: DC
  Filled 2020-05-29: qty 250

## 2020-05-29 MED ORDER — BEVACIZUMAB CHEMO INJECTION 400 MG/16ML: 15.0000 mg/kg | Freq: Once | INTRAVENOUS | Status: DC

## 2020-05-29 NOTE — Telephone Encounter (Signed)
Called pt to r/s phlebotomy appt on 3/25 to 12pm per [facility] RN. Neoma Laming confirmed.

## 2020-05-29 NOTE — Progress Notes (Signed)
554ml phlebotomy well tolerated. 1 liter NS ordered in addition to tecentriq and bevacizumab. After receiving about 771ml and the tecentriq, patient reports feeling tight in the chest from all the fluid. Infusion paused, VS obtained and stable. Dr. Janese Banks notified. OK to stop fluids. Completed bevacizumab and flushed line. Discharged home per Haywood Pao RN. Patient stable and without further complaints at discharge.

## 2020-05-29 NOTE — Progress Notes (Signed)
MD reviewed labs- ok to proceed with tx.  

## 2020-05-29 NOTE — Progress Notes (Signed)
[place]  [phone] [phone]   Name: Maxwell Edwards Date: 05/29/2020 MRN: [id number]  DOB: [birth year]  Patient Care Team: Venita Lick, NP as PCP - General (Nurse Practitioner) Clent Jacks, RN as Oncology Nurse Navigator    REASON FOR CONSULTATION: [name] is a 65 y.o. male with multiple medical problems including CAD status post CABG, hypertension, hyperlipidemia, and multifocal locally advanced hepatocellular carcinoma on systemic treatment with Tecentriq and Avastin.  Patient has been symptomatic with nausea, weight loss, and insomnia.  Palliative care was consulted up address goals and manage ongoing symptoms.  SOCIAL HISTORY:     reports that he has been smoking cigarettes. He has been smoking about 0.50 packs per day. He has never used smokeless tobacco. He reports previous alcohol use. He reports previous drug use.  Patient is divorced.  He lives at home alone.  His ex-wife is still involved in his care as are his 2 daughters.  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 CAD (coronary artery disease)   . Heart disease     PAST SURGICAL HISTORY:  Past Surgical History:  Procedure Laterality Date  . CORONARY ARTERY BYPASS GRAFT     at 65 years old  . ESOPHAGOGASTRODUODENOSCOPY (EGD) WITH PROPOFOL N/A 05/21/2020   Procedure: ESOPHAGOGASTRODUODENOSCOPY (EGD) WITH PROPOFOL;  Surgeon: Lin Landsman, MD;  Location: [facility];  Service: Gastroenterology;  Laterality: N/A;  . PORTA CATH INSERTION N/A 04/30/2020   Procedure: PORTA CATH INSERTION;  Surgeon: Algernon Huxley, MD;  Location: [facility];  Service: Cardiovascular;  Laterality: N/A;  . VEIN BYPASS SURGERY       HEMATOLOGY/ONCOLOGY HISTORY:  Oncology History  Cancer, hepatocellular ([place])  04/20/2020 Cancer Staging   Staging form: Liver, AJCC 8th Edition - Clinical stage from 04/20/2020: Stage IIIB (cT4, cN0, cM0) - Signed by Sindy Guadeloupe, MD on 04/21/2020   04/21/2020 Initial Diagnosis   Cancer, hepatocellular ([place])   05/10/2020 -  Chemotherapy    Patient is on Treatment Plan: LIVER ATEZOLIZUMAB + BEVACIZUMAB Q21D        ALLERGIES:  has No Known Allergies.  MEDICATIONS:  Current Outpatient Medications  Medication Sig Dispense Refill  . lactulose (CHRONULAC) 10 GM/15ML solution Take 15 mLs (10 g total) by mouth daily. 236 mL 0  . lidocaine-prilocaine (EMLA) cream Apply to affected area once 30 g 3  . methylphenidate (RITALIN) 5 MG tablet Take 1 tablet (5 mg total) by mouth daily with breakfast. 15 tablet 0  . mirtazapine (REMERON) 15 MG tablet Take 1 tablet (15 mg total) by mouth at bedtime. 30 tablet 2  . OLANZapine (ZYPREXA) 10 MG tablet Take 1 tablet (10 mg total) by mouth at bedtime. 30 tablet 1  . omeprazole (PRILOSEC) 40 MG capsule Take 1 capsule (40 mg total) by mouth daily before breakfast. (Patient not taking: No sig reported) 30 capsule 6  . ondansetron (ZOFRAN) 8 MG tablet Take 1 tablet (8 mg total) by mouth 2 (two) times daily as needed (Nausea or vomiting). 30 tablet 1  . prochlorperazine (COMPAZINE) 10 MG tablet Take 1 tablet (10 mg total) by mouth every 6 (six) hours as needed (Nausea or vomiting). 30 tablet 1   No current facility-administered medications for this visit.  Facility-Administered Medications Ordered in Other Visits  Medication Dose Route Frequency Provider Last Rate Last Admin  . 0.9 %  sodium chloride infusion   Intravenous Once Sindy Guadeloupe, MD      . sodium chloride flush (NS) 0.9 % injection 10 mL  10 mL Intracatheter PRN Sindy Guadeloupe, MD        VITAL SIGNS: There were no vitals taken for this visit. There were no vitals filed for this visit.   Estimated body mass index is 22.08 kg/m as calculated from the following:   Height as of 05/21/20: 5\' 8"  (1.727 m).   Weight as of an earlier encounter on 05/29/20: 145 lb 3.2 oz (65.9 kg).  LABS: CBC:    Component Value Date/Time   WBC 5.9 05/29/2020 0838   HGB 13.0 05/29/2020 0838   HGB 18.3 (H) 04/06/2020 1152   HCT 38.5 (L) 05/29/2020 0838   HCT 53.8 (H) 04/06/2020 1152   PLT 116 (L) 05/29/2020 0838   PLT 247 04/06/2020 1152   MCV 94.1 05/29/2020 0838   MCV 94 04/06/2020 1152   NEUTROABS 2.6 05/29/2020 0838   NEUTROABS 7.1 (H) 04/06/2020 1152   LYMPHSABS 2.8 05/29/2020 0838   LYMPHSABS 2.1 04/06/2020 1152   MONOABS 0.4 05/29/2020 0838   EOSABS 0.1 05/29/2020 0838   EOSABS 0.1 04/06/2020 1152   BASOSABS 0.0 05/29/2020 0838   BASOSABS 0.1 04/06/2020 1152   Comprehensive Metabolic Panel:    Component Value Date/Time   NA 133 (L) 05/29/2020 0838   NA 135 04/06/2020 1152   K 3.7 05/29/2020 0838   CL 99 05/29/2020 0838   CO2 25 05/29/2020 0838   BUN 16 05/29/2020 0838   BUN 15 04/06/2020 1152   CREATININE 0.78 05/29/2020 0838   GLUCOSE 149 (H) 05/29/2020 0838   CALCIUM 8.4 (L) 05/29/2020 0838   AST 129 (H) 05/29/2020 0838   ALT 102 (H) 05/29/2020 0838   ALKPHOS 304 (H) 05/29/2020 0838   BILITOT 1.3 (H) 05/29/2020 0838   BILITOT 0.6 04/06/2020 1152   PROT 6.9 05/29/2020 0838   PROT 8.2 04/06/2020 1152   ALBUMIN 2.6 (L) 05/29/2020 0838   ALBUMIN 4.3 04/06/2020 1152    RADIOGRAPHIC STUDIES: PERIPHERAL VASCULAR CATHETERIZATION  Result Date: 04/30/2020 See op note   PERFORMANCE STATUS (ECOG) : 1 - Symptomatic but completely ambulatory  Review of Systems Unless otherwise noted, a complete review of systems is negative.  Physical Exam General: NAD HEENT: No trauma to head, clear OP without exudate or thrush, no cervical lymphadenopathy Pulmonary: Clear to auscultation anterior/posterior fields Abdomen: soft, nontender, bowel sounds positive GU: no suprapubic  tenderness Extremities: no edema, no joint deformities Skin: no rashes Neurological: Weakness but otherwise nonfocal  IMPRESSION: Routine follow up visit. Patient seen in infusion.   Patient is significantly more alert today.  He was engaging in conversation.  Symptomatically, he continues to endorse fatigue but feels somewhat improved.  He denies significant other symptomatic complaints today.  He does continue to report poor oral intake.  I asked him about tolerance to methylphenidate and mirtazapine but he is somewhat of a poor historian and was unsure how and when he is taking those medications.   Will continue to follow.   PLAN: -Continue current scope of treatment -Continue mirtazapine 15mg  nightly -Continue methylphenidate 5 mg daily in a.m. -RTC in 3 weeks   Patient expressed understanding and was in agreement with this plan. He also understands that He can call the clinic at any  time with any questions, concerns, or complaints.     Time Total: 15 minutes  Visit consisted of counseling and education dealing with the complex and emotionally intense issues of symptom management and palliative care in the setting of serious and potentially life-threatening illness.Greater than 50%  of this time was spent counseling and coordinating care related to the above assessment and plan.  Signed by: Altha Harm, PhD, NP-C

## 2020-05-29 NOTE — Progress Notes (Signed)
Hematology/Oncology Consult note [facility]  Telephone:([phone] [phone]  Patient Care Team: Venita Lick, NP as PCP - General (Nurse Practitioner) Clent Jacks, RN as Oncology Nurse Navigator   Name of the patient: Maxwell Edwards  [id number]  [birth year]   Date of visit: 05/29/20  Diagnosis- multifocal locally advanced hepatocellular carcinoma  Chief complaint/ Reason for visit- On treatment assessment prior to cycle 2 of Tecentriq and Avastin  Heme/Onc history:  patient is a 65 year old male with a past medical history significant for hypertension hyperlipidemia, 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 Reports that he has lost about 20 pounds over the last 2 to 3 months. He is a chronic smoker which prompted a CT chest. CT chest without contrast on 04/06/2020 showed no evidence of mediastinal or hilar adenopathy. He was found to have 4 subcentimeter lung nodules 3 in the right lobe and one in the left upper lobe which were borderline for metastatic disease. Cirrhotic liver morphology with 8.3 x 7 cm ill-defined hypoattenuating lesion in the left liver with a 3.3 cm lesion in the dome of the liver near the junction of the right and left hepatic lobes. Labs showed elevated AST and ALT of 62 and 47 respectively with a normal albumin and normal total bilirubin. CMP showed elevated H&H of 18.3/53.8. 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 Another mass in the segment 4A measuring 3.4 x 2.9 cm.No evidence of local regional adenopathy.  Alpha-fetoprotein elevated at 38,000. CA 19-9 elevated at 147.Patient's case discussed at tumor board  and consensus was that this would be consistent with [facility] especially given the AFP value. Initially MRI was not characteristic for [facility] but a biopsy was not deemed necessary after AFP results were back   Interval history-oral intake continues to be poor and he is just about drinking 2 ensures a day.  Has ongoing fatigue.  He does not speak much during his visit today.  I obtained most of the history from patient's ex-wife over the phone  ECOG PS- 2 Pain scale- 2 Opioid associated constipation- no  Review of systems- Review of Systems  Constitutional: Positive for malaise/fatigue. Negative for chills, fever and weight loss.  HENT: Negative for congestion, ear discharge and nosebleeds.   Eyes: Negative for blurred vision.  Respiratory: Negative for cough, hemoptysis, sputum production, shortness of breath and wheezing.   Cardiovascular: Negative for chest pain, palpitations, orthopnea and claudication.  Gastrointestinal: Negative for abdominal pain, blood in stool, constipation, diarrhea, heartburn, melena, nausea and vomiting.  Genitourinary: Negative for dysuria, flank pain, frequency, hematuria and urgency.  Musculoskeletal: Negative for back pain, joint pain and myalgias.  Skin: Negative for rash.  Neurological: Negative for dizziness, tingling, focal weakness, seizures, weakness and headaches.  Endo/Heme/Allergies: Does not bruise/bleed easily.  Psychiatric/Behavioral: Negative for depression and suicidal ideas. The patient does not have insomnia.       No Known Allergies   Past Medical History:  Diagnosis Date  . CAD (coronary artery disease)   . Heart disease      Past Surgical History:  Procedure Laterality Date  . CORONARY ARTERY BYPASS GRAFT     at 65 years old  .  ESOPHAGOGASTRODUODENOSCOPY (EGD) WITH PROPOFOL N/A 05/21/2020   Procedure: ESOPHAGOGASTRODUODENOSCOPY (EGD) WITH PROPOFOL;  Surgeon: Lin Landsman, MD;  Location: [facility];  Service:  Gastroenterology;  Laterality: N/A;  . PORTA CATH INSERTION N/A 04/30/2020   Procedure: PORTA CATH INSERTION;  Surgeon: Algernon Huxley, MD;  Location: [place] [facility];  Service: Cardiovascular;  Laterality: N/A;  . VEIN BYPASS SURGERY      Social History   Socioeconomic History  . Marital status: Divorced    Spouse name: Not on file  . Number of children: 2  . Years of education: Not on file  . Highest education level: Not on file  Occupational History  . 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[employer]  . Smoking status: Current Some Day Smoker    Packs/day: 0.50    Types: Cigarettes  . Smokeless tobacco: Never Used  Vaping Use  . Vaping Use: Never used  Substance and Sexual Activity  . Alcohol use: Not Currently  . Drug use: Not Currently  . Sexual activity: Not Currently  Other Topics Concern  . Not on file  Social History Narrative   Lives by himself    Social Determinants of Health   Financial Resource Strain: Not on file  Food Insecurity: Not on file  Transportation Needs: Not on file  Physical Activity: Not on file  Stress: Not on file  Social Connections: Not on file  Intimate Partner Violence: Not on file    Family History  Problem Relation Age of Onset  . Dementia Mother   . Ulcers Father   . Heart attack Brother   . Throat cancer Brother   . Heart attack Daughter   . Diabetes Daughter   . Depression Daughter      Current Outpatient Medications:  .  lactulose (CHRONULAC) 10 GM/15ML solution, Take 15 mLs (10 g total) by mouth daily., Disp: 236 mL, Rfl: 0 .  lidocaine-prilocaine (EMLA) cream, Apply to affected area once, Disp: 30 g, Rfl: 3 .  methylphenidate (RITALIN) 5 MG tablet, Take 1 tablet (5 mg total) by mouth daily with breakfast., Disp: 15 tablet, Rfl: 0 .  mirtazapine (REMERON) 15 MG tablet, Take 1 tablet (15 mg total) by mouth at bedtime., Disp: 30 tablet, Rfl: 2 .  OLANZapine (ZYPREXA) 10 MG tablet, Take 1 tablet (10  mg total) by mouth at bedtime., Disp: 30 tablet, Rfl: 1 .  ondansetron (ZOFRAN) 8 MG tablet, Take 1 tablet (8 mg total) by mouth 2 (two) times daily as needed (Nausea or vomiting)., Disp: 30 tablet, Rfl: 1 .  prochlorperazine (COMPAZINE) 10 MG tablet, Take 1 tablet (10 mg total) by mouth every 6 (six) hours as needed (Nausea or vomiting)., Disp: 30 tablet, Rfl: 1 .  omeprazole (PRILOSEC) 40 MG capsule, Take 1 capsule (40 mg total) by mouth daily before breakfast. (Patient not taking: No sig reported), Disp: 30 capsule, Rfl: 6 No current facility-administered medications for this visit.  Facility-Administered Medications Ordered in Other Visits:  .  0.9 %  sodium chloride infusion, , Intravenous, Once, Sindy Guadeloupe, MD .  sodium chloride flush (NS) 0.9 % injection 10 mL, 10 mL, Intracatheter, PRN, Sindy Guadeloupe, MD  Physical exam:  Vitals:   05/29/20 0901  BP: 113/85  Pulse: 100  Temp: (!) 97.4 F (36.3 C)  TempSrc: Tympanic  SpO2: 100%  Weight: 145 lb 3.2 oz (65.9 kg)   Physical Exam Constitutional:      Comments: Patient  is thin and frail appearing  Cardiovascular:     Rate and Rhythm: Normal rate and regular rhythm.     Heart sounds: Normal heart sounds.  Pulmonary:     Effort: Pulmonary effort is normal.     Breath sounds: Normal breath sounds.  Abdominal:     General: Bowel sounds are normal.     Palpations: Abdomen is soft.  Skin:    General: Skin is warm and dry.  Neurological:     Mental Status: He is alert and oriented to person, place, and time.      CMP Latest Ref Rng & Units 05/29/2020  Glucose 70 - 99 mg/dL 149(H)  BUN 8 - 23 mg/dL 16  Creatinine 0.61 - 1.24 mg/dL 0.78  Sodium 135 - 145 mmol/L 133(L)  Potassium 3.5 - 5.1 mmol/L 3.7  Chloride 98 - 111 mmol/L 99  CO2 22 - 32 mmol/L 25  Calcium 8.9 - 10.3 mg/dL 8.4(L)  Total Protein 6.5 - 8.1 g/dL 6.9  Total Bilirubin 0.3 - 1.2 mg/dL 1.3(H)  Alkaline Phos 38 - 126 U/L 304(H)  AST 15 - 41 U/L 129(H)  ALT  0 - 44 U/L 102(H)   CBC Latest Ref Rng & Units 05/29/2020  WBC 4.0 - 10.5 K/uL 5.9  Hemoglobin 13.0 - 17.0 g/dL 13.0  Hematocrit 39.0 - 52.0 % 38.5(L)  Platelets 150 - 400 K/uL 116(L)    No images are attached to the encounter.  PERIPHERAL VASCULAR CATHETERIZATION  Result Date: 04/30/2020 See op note    Assessment and plan- Patient is a 65 y.o. male  with stage IIIb multifocal locally advanced hepatocellular carcinoma cT4 N0 M0.   Counts okay to proceed with cycle 2 of Tecentriq a Avastin chemotherapy today.  I will see him back in 3 weeks for cycle 3 with repeat labs CBC with differential CMP and AFP.  hereditary hemochromatosis: Iron studies reveal evidence of iron overload with a ferritin level of greater than 7500.  Abnormal LFTs may also be secondary to iron overload.  He will receive his second session of 500 cc of phlebotomy today.  We will try to do phlebotomy twice a week based on his tolerance and hemoglobin.  He will come twice a week next week for phlebotomy and IV fluids as well as in 2 weeks.  He will see NP Altha Harm in 2 weeks  Depression: Continue mirtazapine.  He is also on Ritalin   Visit Diagnosis 1. Encounter for monoclonal antibody treatment for malignancy   2. Encounter for antineoplastic immunotherapy   3. Cancer, hepatocellular ([place])      Dr. Randa Evens, MD, MPH [facility] at [facility] [phone] 05/29/2020 1:12 PM

## 2020-05-30 ENCOUNTER — Inpatient Hospital Stay: Payer: 59 | Admitting: Occupational Therapy

## 2020-05-30 LAB — AFP TUMOR MARKER: AFP, Serum, Tumor Marker: 67360 ng/mL — ABNORMAL HIGH (ref 0.0–8.4)

## 2020-05-31 ENCOUNTER — Other Ambulatory Visit: Payer: 59 | Admitting: Primary Care

## 2020-05-31 ENCOUNTER — Encounter: Payer: Self-pay | Admitting: Oncology

## 2020-06-01 ENCOUNTER — Inpatient Hospital Stay: Payer: 59

## 2020-06-01 ENCOUNTER — Inpatient Hospital Stay (HOSPITAL_BASED_OUTPATIENT_CLINIC_OR_DEPARTMENT_OTHER): Payer: 59 | Admitting: Hospice and Palliative Medicine

## 2020-06-01 ENCOUNTER — Encounter: Payer: Self-pay | Admitting: Oncology

## 2020-06-01 DIAGNOSIS — Z515 Encounter for palliative care: Secondary | ICD-10-CM

## 2020-06-01 DIAGNOSIS — Z5112 Encounter for antineoplastic immunotherapy: Secondary | ICD-10-CM | POA: Diagnosis not present

## 2020-06-01 MED ORDER — SODIUM CHLORIDE 0.9 % IV SOLN
Freq: Once | INTRAVENOUS | Status: AC
  Filled 2020-06-01: qty 250

## 2020-06-01 MED ORDER — SODIUM CHLORIDE 0.9% FLUSH
10.0000 mL | INTRAVENOUS | Status: DC | PRN
  Filled 2020-06-01: qty 10

## 2020-06-01 MED ORDER — HEPARIN SOD (PORK) LOCK FLUSH 100 UNIT/ML IV SOLN
500.0000 [IU] | Freq: Once | INTRAVENOUS | Status: DC
  Filled 2020-06-01: qty 5

## 2020-06-01 NOTE — Progress Notes (Signed)
[place]  [phone] [phone]   Name: Maxwell Edwards Date: 06/01/2020 MRN: [id number]  DOB: December 24, 1955  Patient Care Team: Maxwell Lick, NP as PCP - General (Nurse Practitioner) Maxwell Jacks, RN as Oncology Nurse Navigator    REASON FOR CONSULTATION: Maxwell Edwards is a 65 y.o. male with multiple medical problems including CAD status post CABG, hypertension, hyperlipidemia, and multifocal locally advanced hepatocellular carcinoma on systemic treatment with Tecentriq and Avastin.  Patient has been symptomatic with nausea, weight loss, and insomnia.  Palliative care was consulted up address goals and manage ongoing symptoms.  SOCIAL HISTORY:     reports that he has been smoking cigarettes. He has been smoking about 0.50 packs per day. He has never used smokeless tobacco. He reports previous alcohol use. He reports previous drug use.  Patient is divorced.  He lives at home alone.  His ex-wife is still involved in his care as are his 2 daughters.  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 CAD (coronary artery disease)   . Heart disease     PAST SURGICAL HISTORY:  Past Surgical History:  Procedure Laterality Date  . CORONARY ARTERY BYPASS GRAFT     at 65 years old  . ESOPHAGOGASTRODUODENOSCOPY (EGD) WITH PROPOFOL N/A 05/21/2020   Procedure: ESOPHAGOGASTRODUODENOSCOPY (EGD) WITH PROPOFOL;  Surgeon: Lin Landsman, MD;  Location: [facility];  Service: Gastroenterology;  Laterality: N/A;  . PORTA CATH INSERTION N/A 04/30/2020   Procedure: PORTA CATH INSERTION;  Surgeon: Algernon Huxley, MD;  Location: [place] [facility];  Service: Cardiovascular;  Laterality: N/A;  . VEIN BYPASS SURGERY       HEMATOLOGY/ONCOLOGY HISTORY:  Oncology History  Cancer, hepatocellular ([place])  04/20/2020 Cancer Staging   Staging form: Liver, AJCC 8th Edition - Clinical stage from 04/20/2020: Stage IIIB (cT4, cN0, cM0) - Signed by Maxwell Guadeloupe, MD on 04/21/2020   04/21/2020 Initial Diagnosis   Cancer, hepatocellular ([place])   05/10/2020 -  Chemotherapy    Patient is on Treatment Plan: LIVER ATEZOLIZUMAB + BEVACIZUMAB Q21D        ALLERGIES:  has No Known Allergies.  MEDICATIONS:  Current Outpatient Medications  Medication Sig Dispense Refill  . lactulose (CHRONULAC) 10 GM/15ML solution Take 15 mLs (10 g total) by mouth daily. 236 mL 0  . lidocaine-prilocaine (EMLA) cream Apply to affected area once 30 g 3  . methylphenidate (RITALIN) 5 MG tablet Take 1 tablet (5 mg total) by mouth daily with breakfast. 15 tablet 0  . mirtazapine (REMERON) 15 MG tablet Take 1 tablet (15 mg total) by mouth at bedtime. 30 tablet 2  . OLANZapine (ZYPREXA) 10 MG tablet Take 1 tablet (10 mg total) by mouth at bedtime. 30 tablet 1  . omeprazole (PRILOSEC) 40 MG capsule Take 1 capsule (40 mg total) by mouth daily before breakfast. (Patient not taking: No sig reported) 30 capsule 6  . ondansetron (ZOFRAN) 8 MG tablet Take 1 tablet (8 mg total) by mouth 2 (two) times daily as needed (Nausea or vomiting). 30 tablet 1  . prochlorperazine (COMPAZINE) 10 MG tablet Take 1 tablet (10 mg total) by mouth every 6 (six) hours as needed (Nausea or vomiting). 30 tablet 1   No current facility-administered medications for this visit.  Facility-Administered Medications Ordered in Other Visits  Medication Dose Route Frequency Provider Last Rate Last Admin  . heparin lock flush 100 unit/mL  500 Units Intravenous Once Maxwell Guadeloupe, MD      . sodium chloride flush (NS) 0.9 % injection 10 mL  10 mL Intravenous PRN Maxwell Guadeloupe, MD        VITAL SIGNS: There were no vitals taken for this visit. There were no vitals filed for this  visit.  Estimated body mass index is 22.08 kg/m as calculated from the following:   Height as of 05/21/20: 5\' 8"  (1.727 m).   Weight as of 05/29/20: 145 lb 3.2 oz (65.9 kg).  LABS: CBC:    Component Value Date/Time   WBC 5.9 05/29/2020 0838   HGB 13.0 05/29/2020 0838   HGB 18.3 (H) 04/06/2020 1152   HCT 38.5 (L) 05/29/2020 0838   HCT 53.8 (H) 04/06/2020 1152   PLT 116 (L) 05/29/2020 0838   PLT 247 04/06/2020 1152   MCV 94.1 05/29/2020 0838   MCV 94 04/06/2020 1152   NEUTROABS 2.6 05/29/2020 0838   NEUTROABS 7.1 (H) 04/06/2020 1152   LYMPHSABS 2.8 05/29/2020 0838   LYMPHSABS 2.1 04/06/2020 1152   MONOABS 0.4 05/29/2020 0838   EOSABS 0.1 05/29/2020 0838   EOSABS 0.1 04/06/2020 1152   BASOSABS 0.0 05/29/2020 0838   BASOSABS 0.1 04/06/2020 1152   Comprehensive Metabolic Panel:    Component Value Date/Time   NA 133 (L) 05/29/2020 0838   NA 135 04/06/2020 1152   K 3.7 05/29/2020 0838   CL 99 05/29/2020 0838   CO2 25 05/29/2020 0838   BUN 16 05/29/2020 0838   BUN 15 04/06/2020 1152   CREATININE 0.78 05/29/2020 0838   GLUCOSE 149 (H) 05/29/2020 0838   CALCIUM 8.4 (L) 05/29/2020 0838   AST 129 (H) 05/29/2020 0838   ALT 102 (H) 05/29/2020 0838   ALKPHOS 304 (H) 05/29/2020 0838   BILITOT 1.3 (H) 05/29/2020 0838   BILITOT 0.6 04/06/2020 1152   PROT 6.9 05/29/2020 0838   PROT 8.2 04/06/2020 1152   ALBUMIN 2.6 (L) 05/29/2020 0838   ALBUMIN 4.3 04/06/2020 1152    RADIOGRAPHIC STUDIES: No results found.  PERFORMANCE STATUS (ECOG) : 1 - Symptomatic but completely ambulatory  Review of Systems Unless otherwise noted, a complete review of systems is negative.  Physical Exam General: Frail appearing Pulmonary: Unlabored Extremities: no edema, no joint deformities Skin: no rashes Neurological: Weakness but otherwise nonfocal  IMPRESSION: Patient was an add-on to my clinic schedule today at family's request.  Patient seen while he was receiving phlebotomy.  Family  report the patient has had no appreciable oral intake in multiple days. Patient continues to verbalize a desire to press on with his treatment.  He denies a lack of appetite but states that food just does not taste right.  He was given an Ensure today in the clinic but only took a single sip.  We extensively discussed the option of a feeding tube.  Patient ultimately says that he is not interested in a feeding tube even if it means that he continues to decline.  Family want to support patient's decision making.  However, they recognize that patient's current clinical trajectory is highly likely to result in further decline and possible hospitalization.  Patient also told family that he is not interested in hospitalization.  We also discussed a future option of hospice should patient continue to decline.  Patient continues to deny depression  although family readily admit to seeing depressive symptoms.  Patient is not interested in doing anything other than lying in bed.  He continues to take mirtazapine at night and methylphenidate in the morning.  I discussed option of referring him to psychiatry but he declined this.  We talked about option of adding an augmenting medication such as venlafaxine, which could be potentially stimulating.  We also discussed option of adding other appetite stimulants although family ultimately wanted to hold off another week to see if symptoms improve.  PLAN: -Continue current scope of treatment -Continue mirtazapine 15mg  nightly -Continue methylphenidate 5 mg daily in a.m. -Patient pending home palliative care visit -RTC next week   Patient expressed understanding and was in agreement with this plan. He also understands that He can call the clinic at any time with any questions, concerns, or complaints.     Time Total: 45 minutes  Visit consisted of counseling and education dealing with the complex and emotionally intense issues of symptom management and palliative  care in the setting of serious and potentially life-threatening illness.Greater than 50%  of this time was spent counseling and coordinating care related to the above assessment and plan.  Signed by: Altha Harm, PhD, NP-C

## 2020-06-01 NOTE — Telephone Encounter (Signed)
Patient and Max came intoday and they were given explanatio of the appt. Change . It was just we made app for phlebotomy and ivf and then just changed it from 2 encounters into 1 encounter

## 2020-06-01 NOTE — Progress Notes (Signed)
Performed 500 ml phlebotomy via PIV as pt did not put Emla cream on, he did not realize he would be getting IVF's today as well. I explained I could use same IV cath to run NS post phlebotomy. At completion of phlebotomy pt felt dizzy. Immediately began NS replacement. Pt responded and all dizziness is gone. Pt sipping on clear ensure. Instructed pt to use EMLA cream at next phlebotomy appt so RN can run IVF's at same time phlebotomy is occurring to perhaps evade dizzziness. Pt feeling back to his baseline at discharge. Discharged in wheelchair to vehicle.

## 2020-06-02 ENCOUNTER — Encounter: Payer: Self-pay | Admitting: Oncology

## 2020-06-03 ENCOUNTER — Encounter: Payer: Self-pay | Admitting: Oncology

## 2020-06-04 ENCOUNTER — Other Ambulatory Visit: Payer: Self-pay

## 2020-06-04 ENCOUNTER — Telehealth: Payer: Self-pay | Admitting: Hospice and Palliative Medicine

## 2020-06-04 ENCOUNTER — Other Ambulatory Visit: Payer: 59 | Admitting: Primary Care

## 2020-06-04 ENCOUNTER — Telehealth: Payer: Self-pay | Admitting: *Deleted

## 2020-06-04 ENCOUNTER — Encounter: Payer: Self-pay | Admitting: Primary Care

## 2020-06-04 NOTE — Telephone Encounter (Signed)
Call from Mr Nancie Neas family stating that patient has decided to get the feeding tube and to please set it up

## 2020-06-04 NOTE — Telephone Encounter (Signed)
I called and spoke with patient and ex-wife by phone.  Patient's appetite is mildly improved this morning but overall oral intake remains quite poor.  Again discussed risks versus benefits of a feeding tube for artificial nutrition.  Patient has verbalized to me and family that he would like to proceed with a PEG.

## 2020-06-04 NOTE — Telephone Encounter (Signed)
Maxwell Edwards back tomorrow and I will send secure chat to let her know that he is going to be seen for gtube/peg tube

## 2020-06-04 NOTE — Telephone Encounter (Signed)
I spoke with patient and family who confirmed desire to proceed with PEG.  Discussed risks versus benefits.  Order has been sent to Dr. Caroleen Hamman.

## 2020-06-05 ENCOUNTER — Telehealth: Payer: Self-pay

## 2020-06-05 ENCOUNTER — Encounter: Payer: Self-pay | Admitting: Oncology

## 2020-06-05 NOTE — Telephone Encounter (Signed)
Patient's wife called office at this time-she has concerns that her husband is not the nutrition he needs and if they have to wait till Monday for the PEG

## 2020-06-05 NOTE — Telephone Encounter (Signed)
Spoke with Dr.Pabon and ok to move patient's appointment to Friday 06/08/20 @ 10 am patient notified and address and phone number to office was provided. Patient was strongly encouraged to drink ensure, boost and to add ice cream to help add additional calories and to drink plenty of fluids.

## 2020-06-06 ENCOUNTER — Inpatient Hospital Stay: Payer: 59

## 2020-06-06 ENCOUNTER — Encounter: Payer: Self-pay | Admitting: Oncology

## 2020-06-06 ENCOUNTER — Other Ambulatory Visit: Payer: Self-pay

## 2020-06-06 ENCOUNTER — Other Ambulatory Visit: Payer: Self-pay | Admitting: Hospice and Palliative Medicine

## 2020-06-06 ENCOUNTER — Ambulatory Visit: Payer: 59

## 2020-06-06 VITALS — BP 100/72 | HR 87 | Temp 96.6°F | Resp 18

## 2020-06-06 DIAGNOSIS — C220 Liver cell carcinoma: Secondary | ICD-10-CM

## 2020-06-06 DIAGNOSIS — Z5112 Encounter for antineoplastic immunotherapy: Secondary | ICD-10-CM | POA: Diagnosis not present

## 2020-06-06 LAB — CBC WITH DIFFERENTIAL/PLATELET
Abs Immature Granulocytes: 0.03 10*3/uL (ref 0.00–0.07)
Basophils Absolute: 0.1 10*3/uL (ref 0.0–0.1)
Basophils Relative: 1 %
Eosinophils Absolute: 0.1 10*3/uL (ref 0.0–0.5)
Eosinophils Relative: 2 %
HCT: 40.2 % (ref 39.0–52.0)
Hemoglobin: 13.3 g/dL (ref 13.0–17.0)
Immature Granulocytes: 0 %
Lymphocytes Relative: 31 %
Lymphs Abs: 2.1 10*3/uL (ref 0.7–4.0)
MCH: 32.2 pg (ref 26.0–34.0)
MCHC: 33.1 g/dL (ref 30.0–36.0)
MCV: 97.3 fL (ref 80.0–100.0)
Monocytes Absolute: 0.5 10*3/uL (ref 0.1–1.0)
Monocytes Relative: 7 %
Neutro Abs: 4.1 10*3/uL (ref 1.7–7.7)
Neutrophils Relative %: 59 %
Platelets: 223 10*3/uL (ref 150–400)
RBC: 4.13 MIL/uL — ABNORMAL LOW (ref 4.22–5.81)
RDW: 15.4 % (ref 11.5–15.5)
WBC: 6.9 10*3/uL (ref 4.0–10.5)
nRBC: 0 % (ref 0.0–0.2)

## 2020-06-06 MED ORDER — METHYLPHENIDATE HCL 5 MG PO TABS: 5.0000 mg | ORAL_TABLET | Freq: Every day | ORAL | 0 refills | Status: DC

## 2020-06-06 MED ORDER — MIRTAZAPINE 15 MG PO TABS: 15.0000 mg | ORAL_TABLET | Freq: Every day | ORAL | 2 refills | Status: DC

## 2020-06-06 MED ORDER — HEPARIN SOD (PORK) LOCK FLUSH 100 UNIT/ML IV SOLN
500.0000 [IU] | Freq: Once | INTRAVENOUS | Status: AC
  Administered 2020-06-06: 500 [IU] via INTRAVENOUS
  Filled 2020-06-06: qty 5

## 2020-06-06 MED ORDER — ONDANSETRON HCL 8 MG PO TABS: 8.0000 mg | ORAL_TABLET | Freq: Two times a day (BID) | ORAL | 2 refills | Status: DC | PRN

## 2020-06-06 MED ORDER — SODIUM CHLORIDE 0.9% FLUSH
10.0000 mL | INTRAVENOUS | Status: DC | PRN
  Administered 2020-06-06: 10 mL via INTRAVENOUS
  Filled 2020-06-06: qty 10

## 2020-06-06 MED ORDER — SODIUM CHLORIDE 0.9 % IV SOLN
Freq: Once | INTRAVENOUS | Status: AC
  Filled 2020-06-06: qty 250

## 2020-06-06 NOTE — Addendum Note (Signed)
Addended by: Irean Hong on: 06/06/2020 10:44 AM   Modules accepted: Orders

## 2020-06-06 NOTE — Progress Notes (Signed)
Pt received 1 liter of NS via port. Initiated prior to 500 ml phlebotomy from PIV L AC. No complaints of dizziness post phlebotomy. Sipping on water. Max requested 3 refills on the only medications pt is taking at this time; mirtazipine, ondansetron and methylphenidate. Billey Chang NP sent refills into [facility]. Changed Friday appt time in our clinic to accommodate appt w/ Dr Dahlia Byes for G tube placement evaluation. VSS at time of discharge.

## 2020-06-07 ENCOUNTER — Telehealth: Payer: Self-pay

## 2020-06-07 NOTE — Telephone Encounter (Signed)
Nutrition  Notified that patient is planning to proceed with PEG placement.  Meeting with Dr Dahlia Byes planned on 4/1 for consultation.  PEG placement date not determined yet.    Called to talk with patient.  No answer. Left message and call back phone number.  Nutrition appointments set up for 4/5 and 4/8.    Cori Henningsen B. Zenia Resides, [place], [place] Registered Dietitian [phone] (mobile)

## 2020-06-08 ENCOUNTER — Other Ambulatory Visit: Payer: Self-pay

## 2020-06-08 ENCOUNTER — Encounter: Payer: Self-pay | Admitting: Surgery

## 2020-06-08 ENCOUNTER — Inpatient Hospital Stay (HOSPITAL_BASED_OUTPATIENT_CLINIC_OR_DEPARTMENT_OTHER): Payer: 59 | Admitting: Hospice and Palliative Medicine

## 2020-06-08 ENCOUNTER — Inpatient Hospital Stay: Payer: 59 | Attending: Oncology

## 2020-06-08 ENCOUNTER — Other Ambulatory Visit
Admission: RE | Admit: 2020-06-08 | Discharge: 2020-06-08 | Disposition: A | Discharge status: Home / Self Care | Payer: 59 | Source: Ambulatory Visit | Attending: Surgery | Admitting: Surgery

## 2020-06-08 ENCOUNTER — Ambulatory Visit (INDEPENDENT_AMBULATORY_CARE_PROVIDER_SITE_OTHER): Payer: 59 | Admitting: Surgery

## 2020-06-08 ENCOUNTER — Inpatient Hospital Stay: Payer: 59 | Admitting: Hospice and Palliative Medicine

## 2020-06-08 VITALS — BP 133/86 | HR 81 | Temp 98.4°F | Resp 18

## 2020-06-08 VITALS — BP 106/76 | HR 105 | Temp 97.7°F | Ht 70.0 in | Wt 134.4 lb

## 2020-06-08 DIAGNOSIS — I2510 Atherosclerotic heart disease of native coronary artery without angina pectoris: Secondary | ICD-10-CM | POA: Insufficient documentation

## 2020-06-08 DIAGNOSIS — K660 Peritoneal adhesions (postprocedural) (postinfection): Secondary | ICD-10-CM | POA: Diagnosis not present

## 2020-06-08 DIAGNOSIS — Z515 Encounter for palliative care: Secondary | ICD-10-CM | POA: Diagnosis not present

## 2020-06-08 DIAGNOSIS — C220 Liver cell carcinoma: Secondary | ICD-10-CM

## 2020-06-08 DIAGNOSIS — E860 Dehydration: Secondary | ICD-10-CM

## 2020-06-08 DIAGNOSIS — F17210 Nicotine dependence, cigarettes, uncomplicated: Secondary | ICD-10-CM | POA: Insufficient documentation

## 2020-06-08 DIAGNOSIS — E43 Unspecified severe protein-calorie malnutrition: Secondary | ICD-10-CM | POA: Insufficient documentation

## 2020-06-08 DIAGNOSIS — Z20822 Contact with and (suspected) exposure to covid-19: Secondary | ICD-10-CM | POA: Insufficient documentation

## 2020-06-08 DIAGNOSIS — R634 Abnormal weight loss: Secondary | ICD-10-CM | POA: Diagnosis not present

## 2020-06-08 DIAGNOSIS — Z01812 Encounter for preprocedural laboratory examination: Secondary | ICD-10-CM | POA: Insufficient documentation

## 2020-06-08 DIAGNOSIS — Z5112 Encounter for antineoplastic immunotherapy: Secondary | ICD-10-CM | POA: Insufficient documentation

## 2020-06-08 DIAGNOSIS — R110 Nausea: Secondary | ICD-10-CM | POA: Insufficient documentation

## 2020-06-08 DIAGNOSIS — Z79899 Other long term (current) drug therapy: Secondary | ICD-10-CM | POA: Insufficient documentation

## 2020-06-08 MED ORDER — HEPARIN SOD (PORK) LOCK FLUSH 100 UNIT/ML IV SOLN
500.0000 [IU] | Freq: Once | INTRAVENOUS | Status: AC
  Administered 2020-06-08: 500 [IU] via INTRAVENOUS
  Filled 2020-06-08: qty 5

## 2020-06-08 MED ORDER — SODIUM CHLORIDE 0.9 % IV SOLN
Freq: Once | INTRAVENOUS | Status: AC
  Filled 2020-06-08: qty 250

## 2020-06-08 MED ORDER — SODIUM CHLORIDE 0.9% FLUSH
10.0000 mL | INTRAVENOUS | Status: DC | PRN
  Administered 2020-06-08: 10 mL via INTRAVENOUS
  Filled 2020-06-08: qty 10

## 2020-06-08 NOTE — Progress Notes (Signed)
[place]  [phone] [phone]   Name: Maxwell Edwards Date: 06/08/2020 MRN: [id number]  DOB: 06/13/1955  Patient Care Team: Venita Lick, NP as PCP - General (Nurse Practitioner) Clent Jacks, RN as Oncology Nurse Navigator    REASON FOR CONSULTATION: Maxwell Edwards is a 64 y.o. male with multiple medical problems including CAD status post CABG, hypertension, hyperlipidemia, and multifocal locally advanced hepatocellular carcinoma on systemic treatment with Tecentriq and Avastin.  Patient has been symptomatic with nausea, weight loss, and insomnia.  Palliative care was consulted up address goals and manage ongoing symptoms.  SOCIAL HISTORY:     reports that he has been smoking cigarettes. He has been smoking about 0.50 packs per day. He has never used smokeless tobacco. He reports previous alcohol use. He reports previous drug use.  Patient is divorced.  He lives at home alone.  His ex-wife is still involved in his care as are his 2 daughters.  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 CAD (coronary artery disease)   . Heart disease     PAST SURGICAL HISTORY:  Past Surgical History:  Procedure Laterality Date  . CORONARY ARTERY BYPASS GRAFT     at 65 years old  . ESOPHAGOGASTRODUODENOSCOPY (EGD) WITH PROPOFOL N/A 05/21/2020   Procedure: ESOPHAGOGASTRODUODENOSCOPY (EGD) WITH PROPOFOL;  Surgeon: Lin Landsman, MD;  Location: [facility];  Service: Gastroenterology;  Laterality: N/A;  . PORTA CATH INSERTION N/A 04/30/2020   Procedure: PORTA CATH INSERTION;  Surgeon: Algernon Huxley, MD;  Location: [facility];  Service: Cardiovascular;  Laterality: N/A;  . VEIN BYPASS SURGERY       HEMATOLOGY/ONCOLOGY HISTORY:  Oncology History  Cancer, hepatocellular ([facility])  04/20/2020 Cancer Staging   Staging form: Liver, AJCC 8th Edition - Clinical stage from 04/20/2020: Stage IIIB (cT4, cN0, cM0) - Signed by Sindy Guadeloupe, MD on 04/21/2020   04/21/2020 Initial Diagnosis   Cancer, hepatocellular ([facility])   05/10/2020 -  Chemotherapy    Patient is on Treatment Plan: LIVER ATEZOLIZUMAB + BEVACIZUMAB Q21D        ALLERGIES:  has No Known Allergies.  MEDICATIONS:  Current Outpatient Medications  Medication Sig Dispense Refill  . lactulose (CHRONULAC) 10 GM/15ML solution Take 15 mLs (10 g total) by mouth daily. (Patient not taking: Reported on 06/08/2020) 236 mL 0  . lidocaine-prilocaine (EMLA) cream Apply to affected area once (Patient taking differently: Apply 1 application topically See admin instructions. Apply to affected area once as needed) 30 g 3  . methylphenidate (RITALIN) 5 MG tablet Take 1 tablet (5 mg total) by mouth daily with breakfast. 30 tablet 0  . mirtazapine (REMERON) 15 MG tablet Take 1 tablet (15 mg total) by mouth at bedtime. 30 tablet 2  . ondansetron (ZOFRAN) 8 MG tablet Take 1 tablet (8 mg total) by mouth 2 (two) times daily as needed (Nausea or vomiting). 45 tablet 2  . prochlorperazine (COMPAZINE) 10 MG tablet Take 1 tablet (10 mg total) by mouth every 6 (six) hours as needed (Nausea or vomiting). (Patient not taking: No sig reported) 30 tablet 1   No current facility-administered medications for this visit.    VITAL SIGNS: There were no vitals taken for this visit. There were no vitals filed  for this visit.  Estimated body mass index is 19.28 kg/m as calculated from the following:   Height as of an earlier encounter on 06/08/20: 5\' 10"  (1.778 m).   Weight as of an earlier encounter on 06/08/20: 134 lb 6.4 oz (61 kg).  LABS: CBC:    Component Value Date/Time   WBC 6.9 06/06/2020 0818   HGB 13.3 06/06/2020 0818   HGB 18.3 (H) 04/06/2020 1152   HCT  40.2 06/06/2020 0818   HCT 53.8 (H) 04/06/2020 1152   PLT 223 06/06/2020 0818   PLT 247 04/06/2020 1152   MCV 97.3 06/06/2020 0818   MCV 94 04/06/2020 1152   NEUTROABS 4.1 06/06/2020 0818   NEUTROABS 7.1 (H) 04/06/2020 1152   LYMPHSABS 2.1 06/06/2020 0818   LYMPHSABS 2.1 04/06/2020 1152   MONOABS 0.5 06/06/2020 0818   EOSABS 0.1 06/06/2020 0818   EOSABS 0.1 04/06/2020 1152   BASOSABS 0.1 06/06/2020 0818   BASOSABS 0.1 04/06/2020 1152   Comprehensive Metabolic Panel:    Component Value Date/Time   NA 133 (L) 05/29/2020 0838   NA 135 04/06/2020 1152   K 3.7 05/29/2020 0838   CL 99 05/29/2020 0838   CO2 25 05/29/2020 0838   BUN 16 05/29/2020 0838   BUN 15 04/06/2020 1152   CREATININE 0.78 05/29/2020 0838   GLUCOSE 149 (H) 05/29/2020 0838   CALCIUM 8.4 (L) 05/29/2020 0838   AST 129 (H) 05/29/2020 0838   ALT 102 (H) 05/29/2020 0838   ALKPHOS 304 (H) 05/29/2020 0838   BILITOT 1.3 (H) 05/29/2020 0838   BILITOT 0.6 04/06/2020 1152   PROT 6.9 05/29/2020 0838   PROT 8.2 04/06/2020 1152   ALBUMIN 2.6 (L) 05/29/2020 0838   ALBUMIN 4.3 04/06/2020 1152    RADIOGRAPHIC STUDIES: No results found.  PERFORMANCE STATUS (ECOG) : 1 - Symptomatic but completely ambulatory  Review of Systems Unless otherwise noted, a complete review of systems is negative.  Physical Exam General: Frail appearing Pulmonary: Unlabored Extremities: no edema, no joint deformities Skin: no rashes Neurological: Weakness but otherwise nonfocal  IMPRESSION: Patient seen while he was receiving IV fluids.  He was accompanied by his ex-wife.  Patient remains frail.  He has increasing weight loss.  Patient and family have decided to pursue PEG and patient saw Dr. Dahlia Byes earlier today.  Plan is likely for PEG insertion early next week.  Patient's family recognizes that he is doing poorly.  We will continue supportive measures and hope that patient improves with increased caloric intake.  Message sent to  nutritionist as patient will need close follow-up.  PLAN: -Continue current scope of treatment -Continue mirtazapine 15mg  nightly -Continue methylphenidate 5 mg daily in a.m. -Referral to nutrition -RTC next week   Patient expressed understanding and was in agreement with this plan. He also understands that He can call the clinic at any time with any questions, concerns, or complaints.     Time Total: 20 minutes  Visit consisted of counseling and education dealing with the complex and emotionally intense issues of symptom management and palliative care in the setting of serious and potentially life-threatening illness.Greater than 50%  of this time was spent counseling and coordinating care related to the above assessment and plan.  Signed by: Altha Harm, PhD, NP-C

## 2020-06-08 NOTE — H&P (View-Only) (Signed)
Patient ID: Manus Weedman, male   DOB: 04-Jan-1956, 65 y.o.   MRN: [id number]  HPI Jillian Pianka is a 65 y.o. male seen in consultation at the request of Dr. Janese Banks for robotic gastrostomy tube.  A does have a diagnosis of multifocal locally advanced hepatocellular carcinoma and is receiving chemotherapy.  He does have an ongoing weight loss of about 30 pounds in the last 2 to 3 months.  He denies any dysphagia but he is states that everything that he taste foul.  He is a chronic a smoker.  Significant weight loss prompted a CT of the chest since he was a smoker and this showed significant mediastinal adenopathy and Left large l hepatocellular carcinoma. Sequently he underwent an MRI that have personally reviewed showing a large hepatocellular carcinoma 8 cm in the left lateral segment.  Associated with multiple multifocal hepatocellular carcinoma. He is currently walking with a cane due to significant fragility and debility.  CBC was unremarkable.  CMP showed an albumin of 2.6 and mild elevation of the AST ALT and alkaline phosphatase.  Total bilirubin is 1.3. He denies any abdominal operations.  He does have a significant history of coronary artery disease and had an CABG about 20 years ago or so. As discussed with Dr. Janese Banks in detail.  I have also discussed with him and his family about goals of care.  He is DNR.  Initially he did not want a feeding tube but he has changed his mind given that he is unable to keep enough PO intake  HPI  Past Medical History:  Diagnosis Date  . CAD (coronary artery disease)   . Heart disease     Past Surgical History:  Procedure Laterality Date  . CORONARY ARTERY BYPASS GRAFT     at 65 years old  . ESOPHAGOGASTRODUODENOSCOPY (EGD) WITH PROPOFOL N/A 05/21/2020   Procedure: ESOPHAGOGASTRODUODENOSCOPY (EGD) WITH PROPOFOL;  Surgeon: Lin Landsman, MD;  Location: [facility];  Service: Gastroenterology;  Laterality: N/A;  . PORTA CATH INSERTION N/A 04/30/2020    Procedure: PORTA CATH INSERTION;  Surgeon: Algernon Huxley, MD;  Location: [facility];  Service: Cardiovascular;  Laterality: N/A;  . VEIN BYPASS SURGERY      Family History  Problem Relation Age of Onset  . Dementia Mother   . Ulcers Father   . Heart attack Brother   . Throat cancer Brother   . Heart attack Daughter   . Diabetes Daughter   . Depression Daughter     Social History Social History   Tobacco Use  . Smoking status: Current Some Day Smoker    Packs/day: 0.50    Types: Cigarettes  . Smokeless tobacco: Never Used  Vaping Use  . Vaping Use: Never used  Substance Use Topics  . Alcohol use: Not Currently  . Drug use: Not Currently    No Known Allergies  Current Outpatient Medications  Medication Sig Dispense Refill  . lactulose (CHRONULAC) 10 GM/15ML solution Take 15 mLs (10 g total) by mouth daily. 236 mL 0  . lidocaine-prilocaine (EMLA) cream Apply to affected area once 30 g 3  . methylphenidate (RITALIN) 5 MG tablet Take 1 tablet (5 mg total) by mouth daily with breakfast. 30 tablet 0  . mirtazapine (REMERON) 15 MG tablet Take 1 tablet (15 mg total) by mouth at bedtime. 30 tablet 2  . ondansetron (ZOFRAN) 8 MG tablet Take 1 tablet (8 mg total) by mouth 2 (two) times daily as needed (Nausea or vomiting).  45 tablet 2  . prochlorperazine (COMPAZINE) 10 MG tablet Take 1 tablet (10 mg total) by mouth every 6 (six) hours as needed (Nausea or vomiting). (Patient not taking: Reported on 06/08/2020) 30 tablet 1   No current facility-administered medications for this visit.     Review of Systems Full ROS  was asked and was negative except for the information on the HPI  Physical Exam Blood pressure 106/76, pulse (!) 105, temperature 97.7 F (36.5 C), temperature source Oral, height 5\' 10"  (1.778 m), weight 134 lb 6.4 oz (61 kg), SpO2 98 %. CONSTITUTIONAL: cachectic and debilitated. EYES: Pupils are equal, round, and reactive to light, Sclera are  non-icteric. EARS, NOSE, MOUTH AND THROAT: wearing a mask  Hearing is intact to voice. LYMPH NODES:  Lymph nodes in the neck are normal. RESPIRATORY:  Lungs are clear. There is normal respiratory effort, with equal breath sounds bilaterally, and without pathologic use of accessory muscles. CARDIOVASCULAR: Heart is regular without murmurs, gallops, or rubs. GI: The abdomen is soft, nontender, and nondistended. T There is a large mass subxiphoid / epigastric region likely from liver mass  There are no palpable masses. There is no hepatosplenomegaly. There are normal bowel sounds in all quadrants. GU: Rectal deferred.   MUSCULOSKELETAL: Normal muscle strength and tone. No cyanosis or edema.   SKIN: Turgor is good and there are no pathologic skin lesions or ulcers. NEUROLOGIC: Motor and sensation is grossly normal. Cranial nerves are grossly intact. PSYCH:  Oriented to person, place and time. Affect is normal.  Data Reviewed I have personally reviewed the patient's imaging, laboratory findings and medical records.    Assessment/Plan 65 year old male with advanced hepatocellular carcinoma and severe malnutrition.  I have discussed with him the options he seems to be interested in robotic gastrostomy tube.  He is DNR.  I have had an extensive discussion regarding goals of care.  He is DNR and he wishes to think about whether he wants to lift his DNR during the operation.  He understands also that because his hepatocellular carcinoma is advanced and I can actually palpate in the epigastric region we might not be able to physically place a gastrostomy tube.  He is well aware of this.  He is also aware of potential complications including leak, infection, bowel injuries. He is also aware that the gastrostomy tube will only aid in nutrition and will not interfere with his cancer therapy.  Extensive counseling provided to the patient and his wife we will tentatively place him for robotic G-tube early next  week.  Time spent with the patient was 60 minutes, with more than 50% of the time spent in face-to-face education, counseling and care coordination.     Caroleen Hamman, MD FACS General Surgeon 06/08/2020, 10:59 AM

## 2020-06-08 NOTE — Progress Notes (Signed)
Patient ID: Maxwell Edwards, male   DOB: 12-24-1955, 65 y.o.   MRN: [id number]  HPI Maxwell Edwards is a 65 y.o. male seen in consultation at the request of Dr. Janese Banks for robotic gastrostomy tube.  A does have a diagnosis of multifocal locally advanced hepatocellular carcinoma and is receiving chemotherapy.  He does have an ongoing weight loss of about 30 pounds in the last 2 to 3 months.  He denies any dysphagia but he is states that everything that he taste foul.  He is a chronic a smoker.  Significant weight loss prompted a CT of the chest since he was a smoker and this showed significant mediastinal adenopathy and Left large l hepatocellular carcinoma. Sequently he underwent an MRI that have personally reviewed showing a large hepatocellular carcinoma 8 cm in the left lateral segment.  Associated with multiple multifocal hepatocellular carcinoma. He is currently walking with a cane due to significant fragility and debility.  CBC was unremarkable.  CMP showed an albumin of 2.6 and mild elevation of the AST ALT and alkaline phosphatase.  Total bilirubin is 1.3. He denies any abdominal operations.  He does have a significant history of coronary artery disease and had an CABG about 20 years ago or so. As discussed with Dr. Janese Banks in detail.  I have also discussed with him and his family about goals of care.  He is DNR.  Initially he did not want a feeding tube but he has changed his mind given that he is unable to keep enough PO intake  HPI  Past Medical History:  Diagnosis Date  . CAD (coronary artery disease)   . Heart disease     Past Surgical History:  Procedure Laterality Date  . CORONARY ARTERY BYPASS GRAFT     at 65 years old  . ESOPHAGOGASTRODUODENOSCOPY (EGD) WITH PROPOFOL N/A 05/21/2020   Procedure: ESOPHAGOGASTRODUODENOSCOPY (EGD) WITH PROPOFOL;  Surgeon: Lin Landsman, MD;  Location: [facility];  Service: Gastroenterology;  Laterality: N/A;  . PORTA CATH INSERTION N/A 04/30/2020    Procedure: PORTA CATH INSERTION;  Surgeon: Algernon Huxley, MD;  Location: [facility];  Service: Cardiovascular;  Laterality: N/A;  . VEIN BYPASS SURGERY      Family History  Problem Relation Age of Onset  . Dementia Mother   . Ulcers Father   . Heart attack Brother   . Throat cancer Brother   . Heart attack Daughter   . Diabetes Daughter   . Depression Daughter     Social History Social History   Tobacco Use  . Smoking status: Current Some Day Smoker    Packs/day: 0.50    Types: Cigarettes  . Smokeless tobacco: Never Used  Vaping Use  . Vaping Use: Never used  Substance Use Topics  . Alcohol use: Not Currently  . Drug use: Not Currently    No Known Allergies  Current Outpatient Medications  Medication Sig Dispense Refill  . lactulose (CHRONULAC) 10 GM/15ML solution Take 15 mLs (10 g total) by mouth daily. 236 mL 0  . lidocaine-prilocaine (EMLA) cream Apply to affected area once 30 g 3  . methylphenidate (RITALIN) 5 MG tablet Take 1 tablet (5 mg total) by mouth daily with breakfast. 30 tablet 0  . mirtazapine (REMERON) 15 MG tablet Take 1 tablet (15 mg total) by mouth at bedtime. 30 tablet 2  . ondansetron (ZOFRAN) 8 MG tablet Take 1 tablet (8 mg total) by mouth 2 (two) times daily as needed (Nausea or vomiting).  45 tablet 2  . prochlorperazine (COMPAZINE) 10 MG tablet Take 1 tablet (10 mg total) by mouth every 6 (six) hours as needed (Nausea or vomiting). (Patient not taking: Reported on 06/08/2020) 30 tablet 1   No current facility-administered medications for this visit.     Review of Systems Full ROS  was asked and was negative except for the information on the HPI  Physical Exam Blood pressure 106/76, pulse (!) 105, temperature 97.7 F (36.5 C), temperature source Oral, height 5\' 10"  (1.778 m), weight 134 lb 6.4 oz (61 kg), SpO2 98 %. CONSTITUTIONAL: cachectic and debilitated. EYES: Pupils are equal, round, and reactive to light, Sclera are  non-icteric. EARS, NOSE, MOUTH AND THROAT: wearing a mask  Hearing is intact to voice. LYMPH NODES:  Lymph nodes in the neck are normal. RESPIRATORY:  Lungs are clear. There is normal respiratory effort, with equal breath sounds bilaterally, and without pathologic use of accessory muscles. CARDIOVASCULAR: Heart is regular without murmurs, gallops, or rubs. GI: The abdomen is soft, nontender, and nondistended. T There is a large mass subxiphoid / epigastric region likely from liver mass  There are no palpable masses. There is no hepatosplenomegaly. There are normal bowel sounds in all quadrants. GU: Rectal deferred.   MUSCULOSKELETAL: Normal muscle strength and tone. No cyanosis or edema.   SKIN: Turgor is good and there are no pathologic skin lesions or ulcers. NEUROLOGIC: Motor and sensation is grossly normal. Cranial nerves are grossly intact. PSYCH:  Oriented to person, place and time. Affect is normal.  Data Reviewed I have personally reviewed the patient's imaging, laboratory findings and medical records.    Assessment/Plan 65 year old male with advanced hepatocellular carcinoma and severe malnutrition.  I have discussed with him the options he seems to be interested in robotic gastrostomy tube.  He is DNR.  I have had an extensive discussion regarding goals of care.  He is DNR and he wishes to think about whether he wants to lift his DNR during the operation.  He understands also that because his hepatocellular carcinoma is advanced and I can actually palpate in the epigastric region we might not be able to physically place a gastrostomy tube.  He is well aware of this.  He is also aware of potential complications including leak, infection, bowel injuries. He is also aware that the gastrostomy tube will only aid in nutrition and will not interfere with his cancer therapy.  Extensive counseling provided to the patient and his wife we will tentatively place him for robotic G-tube early next  week.  Time spent with the patient was 60 minutes, with more than 50% of the time spent in face-to-face education, counseling and care coordination.     Caroleen Hamman, MD FACS General Surgeon 06/08/2020, 10:59 AM

## 2020-06-08 NOTE — Patient Instructions (Signed)
Our surgery scheduler Pamala Hurry will call you within 24-48 hours to get you scheduled. If you have not heard from her after 48 hours, please call our office. You will need to get Covid tested before surgery and have the blue sheet available when she calls to write down important information.     Gastrostomy Tube Home Guide, Adult A gastrostomy tube, or G-tube, is a tube that is inserted through the abdomen into the stomach. The tube is used to give feedings and medicines when a person cannot eat and drink enough on his or her own or take medicines by mouth. How to care for the insertion site Supplies needed:  Saline solution or clean, warm water and soap. Saline solution is made of salt and water.  Cotton swab or gauze.  Pre-cut gauze bandage (dressing) and tape, if needed. Instructions Follow these steps daily to clean the insertion site: 1. Wash your hands with soap and water for at least 20 seconds. 2. Remove the dressing (if there is one) that is between the person's skin and the tube. 3. Check the area where the tube enters the skin. Check daily for problems such as: ? Redness, rash, or irritation. ? Swelling. ? Pus-like drainage. ? Extra skin growth. 4. Moisten the cotton swab or gauze with the saline solution or with a soap-and-water mixture. Gently clean around the insertion site. Remove any drainage or crusted material. ? When the G-tube is first put in, a normal saline solution or water can be used to clean the skin. ? After the skin around the tube has healed, mild soap and water may be used. 5. Apply a dressing (if there should be one) between the person's skin and the tube.   How to flush a G-tube Flush the G-tube regularly to keep it from clogging. Flush it before and after feedings and as often as told by the health care provider. Supplies needed:  Purified or germ-free (sterile) water, warmed.  Container with lid for boiling water, if needed.  60 cc G-tube  syringe. Instructions Before you begin, decide whether to use sterile water or purified drinking water.  Use only sterile water if: ? The person has a weak disease-fighting (immune) system. ? The person has trouble fighting off infections (is immunocompromised). ? You are unsure about the amount of chemical contaminants in purified or drinking water.  Use purified drinking water in all other cases. To purify drinking water by boiling: ? Boil water for at least 1 minute. Keep lid over water while it boils. ? Let water cool to room temperature before using. Follow these steps to flush the G-tube: 1. Wash your hands with soap and water for at least 20 seconds. 2. Bring out (draw up) 30 mL of warm water in a syringe. 3. Connect the syringe to the tube. 4. Slowly and gently push the water into the tube. General tips  If the tube comes out: ? Cover the opening with a clean dressing and tape. ? Get help right away.  If there is skin or scar tissue growing where the tube enters the skin: ? Keep the area clean and dry. ? Secure the tube with tape so that the tube does not move around too much.  If the tube gets clogged: ? Slowly push warm water into the tube with a large syringe. ? Do not force the fluid into the tube or push an object into the tube. ? Get help right away if you cannot unclog the tube.  Follow these instructions at home: Feedings  Give feedings at room temperature.  If feedings are continuous: ? Do not put more than 4 hours' worth of feedings in the feeding bag. ? Stop the feedings when you need to give medicine or flush the tube. Be sure to restart the feedings. ? Make sure the person's head is above his or her stomach (upright position). This will prevent choking and discomfort.  Make sure the person is in the right position during and after feedings. ? During feedings, have the person in the upright position. ? After a non-continuous feeding (bolus feeding), have  the person stay in the upright position for 1 hour.  Cover and place unused feedings in the refrigerator.  Replace feeding bags and syringes as told. Good hygiene  Make sure the person takes good care of his or her mouth and teeth (oral hygiene), such as by brushing his or her teeth.  Keep the area where the tube enters the skin clean and dry. General instructions  Use syringes made only for G-tubes.  Do not pull or put tension on the tube.  Before you remove the tube cap or disconnect a syringe, close the tube by using a clamp (clamping) or bending (kinking) the tube.  Measure the length of the G-tube every day from the insertion site to the end of the tube.  If the person's G-tube has a balloon, check the fluid in the balloon every week. Check the manufacturer's specifications to find the amount of fluid that should be in the balloon.  Remove excess air from the G-tube as told. This is called venting.  Do not push feedings, medicines, or flushes fast. Contact a health care provider if:  The person with the tube has constipation or a fever.  A large amount of fluid or mucus-like liquid is leaking from the tube.  Skin or scar tissue appears to be growing where the tube enters the skin.  The length of tube from the insertion site to the G-tube gets longer. Get help right away if:  The person with the tube has any of these problems: ? Severe pain, tenderness, or bloating in the abdomen. ? Nausea or vomiting. ? Trouble breathing or shortness of breath.  Any of these problems happen in the area where the tube enters the skin: ? Redness, irritation, swelling, or soreness. ? Pus-like discharge. ? A bad smell.  The tube is clogged and cannot be flushed.  The tube comes out. The tube will need to put back in within 4 hours. Summary  A gastrostomy tube, or G-tube, is a tube that is inserted through the abdomen into the stomach. The tube is used to give feedings and medicines  when a person cannot eat and drink enough on his or her own or cannot take medicine by mouth.  Check and clean the insertion site daily as told by the person's health care provider.  Flush the G-tube regularly to keep it from clogging. Flush it before and after feedings and as often as told.  Keep the area where the tube enters the skin clean and dry. This information is not intended to replace advice given to you by your health care provider. Make sure you discuss any questions you have with your health care provider. Document Revised: 07/11/2019 Document Reviewed: 07/14/2019 Elsevier Patient Education  [place].

## 2020-06-08 NOTE — Progress Notes (Signed)
Pt's caregiver is declining phlebotomy today. She feels he is at his weakest point. He has lost 10 lbs this week. She wants to hold off until after G tube placement on Tues. Pt will come to clinic on Monday for IVF only per discussion. Dr Janese Banks made aware. Billey Chang NP palliative care is seeing pt today. Pt receiving 1 liter of NS only today. Discharged to home in care of his ex wife.

## 2020-06-09 ENCOUNTER — Encounter: Payer: Self-pay | Admitting: Oncology

## 2020-06-09 LAB — SARS CORONAVIRUS 2 (TAT 6-24 HRS): SARS Coronavirus 2: NEGATIVE

## 2020-06-11 ENCOUNTER — Inpatient Hospital Stay: Payer: 59

## 2020-06-11 ENCOUNTER — Encounter
Admission: RE | Admit: 2020-06-11 | Discharge: 2020-06-11 | Disposition: A | Discharge status: Home / Self Care | Payer: 59 | Source: Ambulatory Visit | Attending: Surgery | Admitting: Surgery

## 2020-06-11 ENCOUNTER — Telehealth: Payer: Self-pay

## 2020-06-11 ENCOUNTER — Ambulatory Visit: Payer: 59 | Admitting: Surgery

## 2020-06-11 ENCOUNTER — Other Ambulatory Visit: Payer: Self-pay

## 2020-06-11 ENCOUNTER — Telehealth: Payer: Self-pay | Admitting: Surgery

## 2020-06-11 VITALS — BP 115/74 | HR 90 | Temp 97.8°F | Resp 18

## 2020-06-11 DIAGNOSIS — C220 Liver cell carcinoma: Secondary | ICD-10-CM

## 2020-06-11 DIAGNOSIS — Z5112 Encounter for antineoplastic immunotherapy: Secondary | ICD-10-CM | POA: Diagnosis not present

## 2020-06-11 DIAGNOSIS — Z01818 Encounter for other preprocedural examination: Secondary | ICD-10-CM

## 2020-06-11 DIAGNOSIS — E860 Dehydration: Secondary | ICD-10-CM

## 2020-06-11 HISTORY — DX: Unspecified cirrhosis of liver: K74.60

## 2020-06-11 HISTORY — DX: Barrett's esophagus without dysplasia: K22.70

## 2020-06-11 HISTORY — DX: Malignant (primary) neoplasm, unspecified: C80.1

## 2020-06-11 LAB — CBC WITH DIFFERENTIAL/PLATELET
Abs Immature Granulocytes: 0.03 10*3/uL (ref 0.00–0.07)
Basophils Absolute: 0.1 10*3/uL (ref 0.0–0.1)
Basophils Relative: 1 %
Eosinophils Absolute: 0.1 10*3/uL (ref 0.0–0.5)
Eosinophils Relative: 1 %
HCT: 37.3 % — ABNORMAL LOW (ref 39.0–52.0)
Hemoglobin: 12.2 g/dL — ABNORMAL LOW (ref 13.0–17.0)
Immature Granulocytes: 1 %
Lymphocytes Relative: 25 %
Lymphs Abs: 1.5 10*3/uL (ref 0.7–4.0)
MCH: 32.4 pg (ref 26.0–34.0)
MCHC: 32.7 g/dL (ref 30.0–36.0)
MCV: 99.2 fL (ref 80.0–100.0)
Monocytes Absolute: 0.4 10*3/uL (ref 0.1–1.0)
Monocytes Relative: 7 %
Neutro Abs: 3.9 10*3/uL (ref 1.7–7.7)
Neutrophils Relative %: 65 %
Platelets: 173 10*3/uL (ref 150–400)
RBC: 3.76 MIL/uL — ABNORMAL LOW (ref 4.22–5.81)
RDW: 15.8 % — ABNORMAL HIGH (ref 11.5–15.5)
WBC: 5.9 10*3/uL (ref 4.0–10.5)
nRBC: 0 % (ref 0.0–0.2)

## 2020-06-11 LAB — COMPREHENSIVE METABOLIC PANEL
ALT: 46 U/L — ABNORMAL HIGH (ref 0–44)
AST: 68 U/L — ABNORMAL HIGH (ref 15–41)
Albumin: 2.8 g/dL — ABNORMAL LOW (ref 3.5–5.0)
Alkaline Phosphatase: 319 U/L — ABNORMAL HIGH (ref 38–126)
Anion gap: 10 (ref 5–15)
BUN: 14 mg/dL (ref 8–23)
CO2: 26 mmol/L (ref 22–32)
Calcium: 8.7 mg/dL — ABNORMAL LOW (ref 8.9–10.3)
Chloride: 99 mmol/L (ref 98–111)
Creatinine, Ser: 0.87 mg/dL (ref 0.61–1.24)
GFR, Estimated: >60 mL/min (ref >60)
Glucose, Bld: 116 mg/dL — ABNORMAL HIGH (ref 70–99)
Potassium: 4.9 mmol/L (ref 3.5–5.1)
Sodium: 135 mmol/L (ref 135–145)
Total Bilirubin: 1 mg/dL (ref 0.3–1.2)
Total Protein: 7.1 g/dL (ref 6.5–8.1)

## 2020-06-11 MED ORDER — SODIUM CHLORIDE 0.9% FLUSH
10.0000 mL | INTRAVENOUS | Status: DC | PRN
  Administered 2020-06-11: 10 mL via INTRAVENOUS
  Filled 2020-06-11: qty 10

## 2020-06-11 MED ORDER — HEPARIN SOD (PORK) LOCK FLUSH 100 UNIT/ML IV SOLN
500.0000 [IU] | Freq: Once | INTRAVENOUS | Status: AC
  Administered 2020-06-11: 500 [IU] via INTRAVENOUS
  Filled 2020-06-11: qty 5

## 2020-06-11 MED ORDER — OSMOLITE 1.5 CAL PO LIQD: ORAL | 0 refills | Status: DC

## 2020-06-11 MED ORDER — SODIUM CHLORIDE 0.9 % IV SOLN
Freq: Once | INTRAVENOUS | Status: AC
  Filled 2020-06-11: qty 250

## 2020-06-11 NOTE — Telephone Encounter (Signed)
Pt's wife, Neoma Laming, has been advised of Pre-Admission date/time, COVID Testing date and Surgery date.  Surgery Date: 06/12/20 Preadmission Testing Date: 06/11/20 (phone 1 pm-3 pm) Covid Testing Date: 06/08/20 *already completed  Neoma Laming has also been made aware to call [phone], between 1-3:00pm the day before surgery, to find out what time to arrive for surgery.

## 2020-06-11 NOTE — Telephone Encounter (Signed)
Pt's wife Jackelyn Poling notified of negative covid results. Verbalizes understanding.

## 2020-06-11 NOTE — Patient Instructions (Signed)
Your procedure is scheduled on: 06/12/20 Report to [facility]. To find out your arrival time please call [phone] between 1PM - 3PM on 06/11/20.  Remember: Instructions that are not followed completely may result in serious medical risk, up to and including death, or upon the discretion of your surgeon and anesthesiologist your surgery may need to be rescheduled.     _X__ 1. Do not eat food after midnight the night before your procedure.                 No gum chewing or hard candies. You may drink clear liquids up to 2 hours                 before you are scheduled to arrive for your surgery-    water only  __X__2.  On the morning of surgery brush your teeth with toothpaste and water, you                 may rinse your mouth with mouthwash if you wish.  Do not swallow any              toothpaste of mouthwash.     _X__ 3.  No Alcohol for 24 hours before or after surgery.   _X__ 4.  Do Not Smoke or use e-cigarettes For 24 Hours Prior to Your Surgery.                 Do not use any chewable tobacco products for at least 6 hours prior to                 surgery.  ____  5.  Bring all medications with you on the day of surgery if instructed.   __X__  6.  Notify your doctor if there is any change in your medical condition      (cold, fever, infections).     Do not wear jewelry, make-up, hairpins, clips or nail polish. Do not wear lotions, powders, or perfumes.  Do not shave 48 hours prior to surgery. Men may shave face and neck. Do not bring valuables to the hospital.    [facility] is not responsible for any belongings or valuables.  Contacts, dentures/partials or body piercings may not be worn into surgery. Bring a case for your contacts, glasses or hearing aids, a denture cup will be supplied. Leave your suitcase in the car. After surgery it may be brought to your room. For patients admitted to the hospital, discharge time is  determined by your treatment team.   Patients discharged the day of surgery will not be allowed to drive home.   Please read over the following fact sheets that you were given:   MRSA Information, CHG and Sage wipes  __X__ Take these medicines the morning of surgery with A SIP OF WATER:    1. none  2.   3.   4.  5.  6.  ____ Fleet Enema (as directed)   __X__ Use CHG Soap/SAGE wipes as directed  ____ Use inhalers on the day of surgery  ____ Stop metformin/Janumet/Farxiga 2 days prior to surgery    ____ Take 1/2 of usual insulin dose the night before surgery. No insulin the morning          of surgery.   ____ Stop Blood Thinners Coumadin/Plavix/Xarelto/Pleta/Pradaxa/Eliquis/Effient/Aspirin  on   Or contact your Surgeon, Cardiologist or Medical Doctor regarding  ability to stop  your blood thinners  __X__ Stop Anti-inflammatories 7 days before surgery such as Advil, Ibuprofen, Motrin,  BC or Goodies Powder, Naprosyn, Naproxen, Aleve, Aspirin    __X__ Stop all herbal supplements, fish oil or vitamin E until after surgery.    ____ Bring C-Pap to the hospital.     Instruction via telephone to ex spouse. She is scheduled to pick up instructions and chg/sage today 06/11/20. Understanding verbalized.

## 2020-06-11 NOTE — Telephone Encounter (Signed)
Nutrition  Called patient this am.  No answer.  Left message letting patient know that RD would be seeing him today during fluids to provide teaching for feeding tube, set up supplies, etc.  Encouraged patient to bring support person to appointment.   Maxwell Edwards, [place], [place] Registered Dietitian [phone] (mobile)

## 2020-06-11 NOTE — Addendum Note (Signed)
Addended by: Jennet Maduro B on: 06/11/2020 01:41 PM   Modules accepted: Orders

## 2020-06-11 NOTE — Progress Notes (Addendum)
Nutrition Follow-up:  Patient with advanced hepatocellular cancer.  Patient receiving tecentriq and avastin. Planning PEG tube insertion tomorrow.    Met with patient during fluids and ex-wife Max. Daughter joined [place] via phone.  Patient oral intake per Max is bites at best.  Reports bad taste in his mouth is biggest thing effecting intake.   Reports has not had a bowel movement since Friday 4/1.    Max says patient has issues with back pain especially recently as just laying around most of the day.     Medications: remeron, ondansetron  Labs: reviewed  Anthropometrics:   Weight 134 lb today decreased from 150 lb on 3/3  11% weight loss in the last month, significant   Estimated Energy Needs  Kcals: 2000-2400 Protein: 100-120 g Fluid: > 2 L  NUTRITION DIAGNOSIS: Inadequate oral intake continues   MALNUTRITION DIAGNOSIS: Patient meet criteria for severe malnutrition in context of acute illness likely progressing to chronic illness as evidenced by 11% weight loss in the last month and eating less than or equal to 50% of estimated energy needs   INTERVENTION:  Provided education on how to clean around feeding tube insertion site ande how to give bolus feeding via syringe. Ex-wife, Max able to return demonstration using sample feeding tube.   Recommend once at goal rate 6 carton of osmolite 1.5 via bolus per day (1 1/2 cartons QID) Flush with 60 ml of water before and after each feeding. Patient will need to drink or give additional 240 ml BID to better meet hydration needs. Tube feeding will provide 2130 calories, 89 g protein and 2070m free water.  Meets 100% of nutritional needs. Recommend starting patient at 1/2 carton of osmolite 1.5 QID.  Flush with 637mof water before and after each feeding. Patient is at refeeding risk. Recommend checking Mag, Phosphorus and CMET at least 2 times per week during tube feeding titration.  RD will contact [place], Shelia  with new referral for enteral supplies and formula.  Contact information given to ex-wife along with written instructions on caring for feeding tube, how to give feeding and tube feeding regimen.    MONITORING, EVALUATION, GOAL: weight trends, intake, tube feeding tolerance   NEXT VISIT: Friday, April 8th during fluids  Jaunita Mikels B. AlZenia ResidesRDSand LakeLDWimeregistered Dietitian [phone])

## 2020-06-11 NOTE — Progress Notes (Signed)
No phlebotomy today per Caregiver request. Pt having G tube placed tomorrow. Pre op reached out and asked [place] to get a 12 Lead EKG while in clinic today. Dietician is in going over all instructions for feeding tube/feeds. Max is present for this education. Pt receiving IV hydration only today. Discharged to home.

## 2020-06-12 ENCOUNTER — Ambulatory Visit: Payer: 59 | Admitting: Urgent Care

## 2020-06-12 ENCOUNTER — Ambulatory Visit
Admission: RE | Admit: 2020-06-12 | Discharge: 2020-06-12 | Disposition: A | Discharge status: Home / Self Care | Payer: 59 | Attending: Surgery | Admitting: Surgery

## 2020-06-12 ENCOUNTER — Encounter
Admission: RE | Disposition: A | Discharge status: Home / Self Care | Payer: Self-pay | Source: Home / Self Care | Attending: Surgery

## 2020-06-12 ENCOUNTER — Inpatient Hospital Stay: Payer: 59

## 2020-06-12 ENCOUNTER — Encounter: Payer: Self-pay | Admitting: Surgery

## 2020-06-12 ENCOUNTER — Ambulatory Visit: Payer: 59

## 2020-06-12 ENCOUNTER — Encounter: Payer: Self-pay | Admitting: Oncology

## 2020-06-12 DIAGNOSIS — I2510 Atherosclerotic heart disease of native coronary artery without angina pectoris: Secondary | ICD-10-CM | POA: Insufficient documentation

## 2020-06-12 DIAGNOSIS — K3189 Other diseases of stomach and duodenum: Secondary | ICD-10-CM

## 2020-06-12 DIAGNOSIS — K660 Peritoneal adhesions (postprocedural) (postinfection): Secondary | ICD-10-CM | POA: Diagnosis not present

## 2020-06-12 DIAGNOSIS — E46 Unspecified protein-calorie malnutrition: Secondary | ICD-10-CM

## 2020-06-12 DIAGNOSIS — Z951 Presence of aortocoronary bypass graft: Secondary | ICD-10-CM | POA: Diagnosis not present

## 2020-06-12 DIAGNOSIS — C220 Liver cell carcinoma: Secondary | ICD-10-CM | POA: Diagnosis not present

## 2020-06-12 DIAGNOSIS — Z800 Family history of malignant neoplasm of digestive organs: Secondary | ICD-10-CM | POA: Diagnosis not present

## 2020-06-12 DIAGNOSIS — Z833 Family history of diabetes mellitus: Secondary | ICD-10-CM | POA: Diagnosis not present

## 2020-06-12 DIAGNOSIS — Z820 Family history of epilepsy and other diseases of the nervous system: Secondary | ICD-10-CM | POA: Insufficient documentation

## 2020-06-12 DIAGNOSIS — E43 Unspecified severe protein-calorie malnutrition: Secondary | ICD-10-CM | POA: Diagnosis not present

## 2020-06-12 DIAGNOSIS — F17210 Nicotine dependence, cigarettes, uncomplicated: Secondary | ICD-10-CM | POA: Insufficient documentation

## 2020-06-12 DIAGNOSIS — Z79899 Other long term (current) drug therapy: Secondary | ICD-10-CM | POA: Insufficient documentation

## 2020-06-12 DIAGNOSIS — Z8249 Family history of ischemic heart disease and other diseases of the circulatory system: Secondary | ICD-10-CM | POA: Insufficient documentation

## 2020-06-12 HISTORY — PX: LAPAROSCOPIC LYSIS OF ADHESIONS: SHX5905

## 2020-06-12 SURGERY — LYSIS, ADHESIONS, LAPAROSCOPIC
Anesthesia: General

## 2020-06-12 MED ORDER — ROCURONIUM BROMIDE 10 MG/ML (PF) SYRINGE
PREFILLED_SYRINGE | INTRAVENOUS | Status: AC
  Filled 2020-06-12: qty 10

## 2020-06-12 MED ORDER — BUPIVACAINE-EPINEPHRINE 0.25% -1:200000 IJ SOLN
INTRAMUSCULAR | Status: DC | PRN
  Administered 2020-06-12: 30 mL

## 2020-06-12 MED ORDER — LIDOCAINE HCL (CARDIAC) PF 100 MG/5ML IV SOSY
PREFILLED_SYRINGE | INTRAVENOUS | Status: DC | PRN
  Administered 2020-06-12: 60 mg via INTRAVENOUS

## 2020-06-12 MED ORDER — CHLORHEXIDINE GLUCONATE 0.12 % MT SOLN
OROMUCOSAL | Status: AC
  Administered 2020-06-12: 15 mL via OROMUCOSAL
  Filled 2020-06-12: qty 15

## 2020-06-12 MED ORDER — EPHEDRINE SULFATE 50 MG/ML IJ SOLN
INTRAMUSCULAR | Status: DC | PRN
  Administered 2020-06-12: 5 mg via INTRAVENOUS
  Administered 2020-06-12: 10 mg via INTRAVENOUS
  Administered 2020-06-12: 5 mg via INTRAVENOUS

## 2020-06-12 MED ORDER — SUGAMMADEX SODIUM 200 MG/2ML IV SOLN
INTRAVENOUS | Status: DC | PRN
  Administered 2020-06-12: 200 mg via INTRAVENOUS

## 2020-06-12 MED ORDER — CELECOXIB 200 MG PO CAPS
ORAL_CAPSULE | ORAL | Status: AC
  Administered 2020-06-12: 200 mg via ORAL
  Filled 2020-06-12: qty 1

## 2020-06-12 MED ORDER — ONDANSETRON HCL 4 MG/2ML IJ SOLN
4.0000 mg | Freq: Once | INTRAMUSCULAR | Status: DC | PRN
Start: 2020-06-12 — End: 2020-06-12

## 2020-06-12 MED ORDER — SUCCINYLCHOLINE CHLORIDE 20 MG/ML IJ SOLN
INTRAMUSCULAR | Status: DC | PRN
  Administered 2020-06-12: 100 mg via INTRAVENOUS

## 2020-06-12 MED ORDER — FENTANYL CITRATE (PF) 100 MCG/2ML IJ SOLN
INTRAMUSCULAR | Status: AC
  Filled 2020-06-12: qty 2

## 2020-06-12 MED ORDER — HYDROCODONE-ACETAMINOPHEN 5-325 MG PO TABS: 1.0000 | ORAL_TABLET | Freq: Four times a day (QID) | ORAL | 0 refills | Status: DC | PRN

## 2020-06-12 MED ORDER — FENTANYL CITRATE (PF) 100 MCG/2ML IJ SOLN: 25.0000 ug | INTRAMUSCULAR | Status: DC | PRN

## 2020-06-12 MED ORDER — GABAPENTIN 300 MG PO CAPS: 300.0000 mg | ORAL_CAPSULE | ORAL | Status: AC

## 2020-06-12 MED ORDER — GABAPENTIN 300 MG PO CAPS
ORAL_CAPSULE | ORAL | Status: AC
  Administered 2020-06-12: 300 mg via ORAL
  Filled 2020-06-12: qty 1

## 2020-06-12 MED ORDER — CEFAZOLIN SODIUM-DEXTROSE 2-4 GM/100ML-% IV SOLN
INTRAVENOUS | Status: AC
  Filled 2020-06-12: qty 100

## 2020-06-12 MED ORDER — CEFAZOLIN SODIUM-DEXTROSE 2-4 GM/100ML-% IV SOLN
2.0000 g | INTRAVENOUS | Status: AC
Start: 2020-06-12 — End: 2020-06-12
  Administered 2020-06-12: 2 g via INTRAVENOUS

## 2020-06-12 MED ORDER — ACETAMINOPHEN 500 MG PO TABS: 1000.0000 mg | ORAL_TABLET | ORAL | Status: AC

## 2020-06-12 MED ORDER — SUCCINYLCHOLINE CHLORIDE 200 MG/10ML IV SOSY
PREFILLED_SYRINGE | INTRAVENOUS | Status: AC
  Filled 2020-06-12: qty 10

## 2020-06-12 MED ORDER — ORAL CARE MOUTH RINSE: 15.0000 mL | Freq: Once | OROMUCOSAL | Status: AC

## 2020-06-12 MED ORDER — PROPOFOL 10 MG/ML IV BOLUS
INTRAVENOUS | Status: DC | PRN
  Administered 2020-06-12: 60 mg via INTRAVENOUS

## 2020-06-12 MED ORDER — FENTANYL CITRATE (PF) 100 MCG/2ML IJ SOLN
INTRAMUSCULAR | Status: DC | PRN
  Administered 2020-06-12 (×2): 25 ug via INTRAVENOUS

## 2020-06-12 MED ORDER — CHLORHEXIDINE GLUCONATE CLOTH 2 % EX PADS: 6.0000 | MEDICATED_PAD | Freq: Once | CUTANEOUS | Status: DC

## 2020-06-12 MED ORDER — ONDANSETRON HCL 4 MG/2ML IJ SOLN
INTRAMUSCULAR | Status: DC | PRN
  Administered 2020-06-12: 4 mg via INTRAVENOUS

## 2020-06-12 MED ORDER — FAMOTIDINE 20 MG PO TABS: 20.0000 mg | ORAL_TABLET | Freq: Once | ORAL | Status: AC

## 2020-06-12 MED ORDER — CHLORHEXIDINE GLUCONATE 0.12 % MT SOLN: 15.0000 mL | Freq: Once | OROMUCOSAL | Status: AC

## 2020-06-12 MED ORDER — BUPIVACAINE-EPINEPHRINE (PF) 0.25% -1:200000 IJ SOLN
INTRAMUSCULAR | Status: AC
  Filled 2020-06-12: qty 30

## 2020-06-12 MED ORDER — METOPROLOL TARTRATE 5 MG/5ML IV SOLN
INTRAVENOUS | Status: DC | PRN
  Administered 2020-06-12: 1 mg via INTRAVENOUS

## 2020-06-12 MED ORDER — DEXAMETHASONE SODIUM PHOSPHATE 10 MG/ML IJ SOLN
INTRAMUSCULAR | Status: DC | PRN
  Administered 2020-06-12: 5 mg via INTRAVENOUS

## 2020-06-12 MED ORDER — ROCURONIUM BROMIDE 100 MG/10ML IV SOLN
INTRAVENOUS | Status: DC | PRN
  Administered 2020-06-12: 5 mg via INTRAVENOUS
  Administered 2020-06-12: 10 mg via INTRAVENOUS
  Administered 2020-06-12: 40 mg via INTRAVENOUS

## 2020-06-12 MED ORDER — FAMOTIDINE 20 MG PO TABS
ORAL_TABLET | ORAL | Status: AC
  Administered 2020-06-12: 20 mg via ORAL
  Filled 2020-06-12: qty 1

## 2020-06-12 MED ORDER — BUPIVACAINE LIPOSOME 1.3 % IJ SUSP
INTRAMUSCULAR | Status: AC
  Filled 2020-06-12: qty 20

## 2020-06-12 MED ORDER — CELECOXIB 200 MG PO CAPS
200.0000 mg | ORAL_CAPSULE | ORAL | Status: AC
Start: 2020-06-12 — End: 2020-06-12

## 2020-06-12 MED ORDER — DEXAMETHASONE SODIUM PHOSPHATE 10 MG/ML IJ SOLN
INTRAMUSCULAR | Status: AC
  Filled 2020-06-12: qty 1

## 2020-06-12 MED ORDER — LIDOCAINE HCL (PF) 2 % IJ SOLN
INTRAMUSCULAR | Status: AC
  Filled 2020-06-12: qty 5

## 2020-06-12 MED ORDER — ACETAMINOPHEN 500 MG PO TABS
ORAL_TABLET | ORAL | Status: AC
  Administered 2020-06-12: 1000 mg via ORAL
  Filled 2020-06-12: qty 2

## 2020-06-12 MED ORDER — LACTATED RINGERS IV SOLN: INTRAVENOUS | Status: DC

## 2020-06-12 MED ORDER — ONDANSETRON HCL 4 MG/2ML IJ SOLN
INTRAMUSCULAR | Status: AC
  Filled 2020-06-12: qty 2

## 2020-06-12 MED ORDER — SUGAMMADEX SODIUM 500 MG/5ML IV SOLN
INTRAVENOUS | Status: AC
  Filled 2020-06-12: qty 5

## 2020-06-12 MED ORDER — BUPIVACAINE LIPOSOME 1.3 % IJ SUSP
INTRAMUSCULAR | Status: DC | PRN
  Administered 2020-06-12: 20 mL

## 2020-06-12 MED ORDER — SODIUM CHLORIDE 0.9 % IV SOLN
INTRAVENOUS | Status: DC | PRN
  Administered 2020-06-12: 20 ug/min via INTRAVENOUS

## 2020-06-12 MED ORDER — PHENYLEPHRINE HCL (PRESSORS) 10 MG/ML IV SOLN
INTRAVENOUS | Status: DC | PRN
  Administered 2020-06-12 (×2): 200 ug via INTRAVENOUS

## 2020-06-12 SURGICAL SUPPLY — 63 items
ADH SKN CLS APL DERMABOND .7 (GAUZE/BANDAGES/DRESSINGS) ×2
APL PRP STRL LF DISP 70% ISPRP (MISCELLANEOUS) ×2
CANISTER SUCT 1200ML W/VALVE (MISCELLANEOUS) IMPLANT
CANNULA REDUC XI 12-8 STAPL (CANNULA) ×1
CANNULA REDUCER 12-8 DVNC XI (CANNULA) ×2 IMPLANT
CHLORAPREP W/TINT 26 (MISCELLANEOUS) ×3 IMPLANT
COVER TIP SHEARS 8 DVNC (MISCELLANEOUS) ×2 IMPLANT
COVER TIP SHEARS 8MM DA VINCI (MISCELLANEOUS) ×1
COVER WAND RF STERILE (DRAPES) ×6 IMPLANT
DEFOGGER SCOPE WARMER CLEARIFY (MISCELLANEOUS) ×3 IMPLANT
DERMABOND ADVANCED (GAUZE/BANDAGES/DRESSINGS) ×1
DERMABOND ADVANCED .7 DNX12 (GAUZE/BANDAGES/DRESSINGS) ×2 IMPLANT
DRAPE ARM DVNC X/XI (DISPOSABLE) ×6 IMPLANT
DRAPE COLUMN DVNC XI (DISPOSABLE) ×2 IMPLANT
DRAPE DA VINCI XI ARM (DISPOSABLE) ×3
DRAPE DA VINCI XI COLUMN (DISPOSABLE) ×1
ELECT CAUTERY BLADE 6.4 (BLADE) ×3 IMPLANT
ELECT REM PT RETURN 9FT ADLT (ELECTROSURGICAL) ×3
ELECTRODE REM PT RTRN 9FT ADLT (ELECTROSURGICAL) ×2 IMPLANT
GLOVE SURG ENC MOIS LTX SZ7 (GLOVE) ×6 IMPLANT
GOWN STRL REUS W/ TWL LRG LVL3 (GOWN DISPOSABLE) ×6 IMPLANT
GOWN STRL REUS W/TWL LRG LVL3 (GOWN DISPOSABLE) ×9
IRRIGATION STRYKERFLOW (MISCELLANEOUS) IMPLANT
IRRIGATOR STRYKERFLOW (MISCELLANEOUS)
KIT PINK PAD W/HEAD ARE REST (MISCELLANEOUS) ×3
KIT PINK PAD W/HEAD ARM REST (MISCELLANEOUS) ×2 IMPLANT
LABEL OR SOLS (LABEL) ×3 IMPLANT
MANIFOLD NEPTUNE II (INSTRUMENTS) ×3 IMPLANT
NEEDLE HYPO 22GX1.5 SAFETY (NEEDLE) ×3 IMPLANT
NEEDLE INSUFFLATION 14GA 120MM (NEEDLE) ×3 IMPLANT
OBTURATOR OPTICAL STANDARD 8MM (TROCAR) ×1
OBTURATOR OPTICAL STND 8 DVNC (TROCAR) ×2
OBTURATOR OPTICALSTD 8 DVNC (TROCAR) ×2 IMPLANT
PACK LAP CHOLECYSTECTOMY (MISCELLANEOUS) ×3 IMPLANT
PENCIL ELECTRO HAND CTR (MISCELLANEOUS) ×3 IMPLANT
SEAL CANN UNIV 5-8 DVNC XI (MISCELLANEOUS) ×6 IMPLANT
SEAL XI 5MM-8MM UNIVERSAL (MISCELLANEOUS) ×3
SEALER VESSEL DA VINCI XI (MISCELLANEOUS)
SEALER VESSEL EXT DVNC XI (MISCELLANEOUS) IMPLANT
SET TUBE SMOKE EVAC HIGH FLOW (TUBING) ×3 IMPLANT
SOLUTION ELECTROLUBE (MISCELLANEOUS) ×3 IMPLANT
SPONGE DRAIN TRACH 4X4 STRL 2S (GAUZE/BANDAGES/DRESSINGS) ×3 IMPLANT
SPONGE LAP 18X18 RF (DISPOSABLE) ×3 IMPLANT
SPONGE LAP 4X18 RFD (DISPOSABLE) ×3 IMPLANT
STAPLER CANNULA SEAL DVNC XI (STAPLE) ×2 IMPLANT
STAPLER CANNULA SEAL XI (STAPLE) ×1
SUT ETHILON 3-0 FS-10 30 BLK (SUTURE)
SUT MNCRL 4-0 (SUTURE) ×3
SUT MNCRL 4-0 27XMFL (SUTURE) ×2
SUT V-LOC 90 ABS 3-0 VLT  V-20 (SUTURE)
SUT V-LOC 90 ABS 3-0 VLT V-20 (SUTURE) IMPLANT
SUT VICRYL 0 AB UR-6 (SUTURE) ×3 IMPLANT
SUT VLOC 180 2-0 9IN GS21 (SUTURE) IMPLANT
SUT VLOC 90 S/L VL9 GS22 (SUTURE) IMPLANT
SUTURE EHLN 3-0 FS-10 30 BLK (SUTURE) IMPLANT
SUTURE MNCRL 4-0 27XMF (SUTURE) ×2 IMPLANT
SYR 20ML LL LF (SYRINGE) ×3 IMPLANT
SYR 30ML LL (SYRINGE) ×3 IMPLANT
TROCAR BALLN GELPORT 12X130M (ENDOMECHANICALS) ×3 IMPLANT
TUBE GASTRO 14F 5C (TUBING) IMPLANT
TUBE JEJUNO 16X14 (TUBING) IMPLANT
TUBE JEJUNO 18X14 (TUBING) IMPLANT
TUBE JEJUNO 22X14 (TUBING) IMPLANT

## 2020-06-12 NOTE — Anesthesia Postprocedure Evaluation (Signed)
Anesthesia Post Note  Patient: Maxwell Edwards  Procedure(s) Performed: LAPAROSCOPIC LYSIS OF ADHESIONS  Patient location during evaluation: PACU Anesthesia Type: General Level of consciousness: awake and alert and oriented Pain management: pain level controlled Vital Signs Assessment: post-procedure vital signs reviewed and stable Respiratory status: spontaneous breathing Cardiovascular status: blood pressure returned to baseline Anesthetic complications: no   No complications documented.   Last Vitals:  Vitals:   06/12/20 1515 06/12/20 1530  BP: 111/70 114/72  Pulse: 76 75  Resp: (!) 21 (!) 22  Temp: 37 C   SpO2: 100% 99%    Last Pain:  Vitals:   06/12/20 1515  TempSrc:   PainSc: 0-No pain                 Donyae Kilner

## 2020-06-12 NOTE — Transfer of Care (Signed)
Immediate Anesthesia Transfer of Care Note  Patient: Maxwell Edwards  Procedure(s) Performed: LAPAROSCOPIC LYSIS OF ADHESIONS  Patient Location: PACU  Anesthesia Type:General  Level of Consciousness: drowsy and patient cooperative  Airway & Oxygen Therapy: Patient Spontanous Breathing and Patient connected to face mask oxygen  Post-op Assessment: Report given to RN and Post -op Vital signs reviewed and stable  Post vital signs: Reviewed and stable  Last Vitals:  Vitals Value Taken Time  BP 119/76 06/12/20 1500  Temp 37.5 C 06/12/20 1455  Pulse 77 06/12/20 1501  Resp 20 06/12/20 1501  SpO2 100 % 06/12/20 1501  Vitals shown include unvalidated device data.  Last Pain:  Vitals:   06/12/20 1311  TempSrc: Temporal  PainSc: 0-No pain         Complications: No complications documented.

## 2020-06-12 NOTE — Interval H&P Note (Signed)
History and Physical Interval Note:  06/12/2020 1:04 PM  Maxwell Edwards  has presented today for surgery, with the diagnosis of Malnutrition.  The various methods of treatment have been discussed with the patient and family. After consideration of risks, benefits and other options for treatment, the patient has consented to  Procedure(s): XI Twilight (N/A) as a surgical intervention.  The patient's history has been reviewed, patient examined, no change in status, stable for surgery.  I have reviewed the patient's chart and labs.  Questions were answered to the patient's satisfaction.     [place]

## 2020-06-12 NOTE — Op Note (Signed)
Robotic assisted laparoscopic lysis of adhesions   Pre-operative Diagnosis: malnutrition, HCC   Post-operative Diagnosis: same   Procedure:  Robotic assisted laparoscopic lysis of adhesions    Surgeon: Caroleen Hamman, MD FACS   Anesthesia: Gen. with endotracheal tube   Findings: Massive Hepatocellular carcinoma left lobe of the liver, anterior to the stomach. Severe adhesions from the omentum to the liver and to the stomach After laparoscopic mobilization of the stomach , Stomach was not able to reach to the abdominal wall due to significant liver cancer impairing stomach mobility   Estimated Blood Loss:5 cc             Complications: none     Procedure Details  The patient was seen again in the Holding Room. The benefits, complications, treatment options, and expected outcomes were discussed with the Family. The risks of bleeding, infection, recurrence of symptoms, failure to resolve symptoms, bowel injury, any of which could require further surgery were reviewed .   The  family concurred with the proposed plan, giving informed consent.  The patient was taken to Operating Room, identified  and the procedure verified. A Time Out was held and the above information confirmed.   Prior to the induction of general anesthesia, antibiotic prophylaxis was administered. VTE prophylaxis was in place. General endotracheal anesthesia was then administered and tolerated well. After the induction, the abdomen was prepped with Chloraprep and draped in the sterile fashion. The patient was positioned in the supine position.  Veres needle was inserted into the LUQ, saline test and pressure test were adequate.  Pneumoperitoneum was then created with CO2 and tolerated well without any adverse changes in the patient's vital signs.  Two 8-mm ports were placed under direct vision.  Inspection revealed large liver mass crossing the midline and displacing the stomach posteriorly.     The patient was positioned   in reverse Trendelenburg, robot was brought to the surgical field and docked in the standard fashion.  We made sure all the instrumentation was kept indirect view at all times and that there were no collision between the arms. I scrubbed out and went to the console. Using the scissors I started with lysis of adhesions, the omentum was adhere to the liver mass and to the abdominal wall. The adhesions were lysed with scissors and using cautery where appropriate. I mobilized the stomach by divided the short gastric and dividing the omentum from the transverse colon. I use bipolar device to obtain good hemostasis . Stomach was not able to reach to the abdominal wall due to significant liver cancer impairing stomach mobility. Lesser curvature was plastered to the left liver lobe likely from direct invasion. I decided not to place a gastrostomy tube since It was not technically possible. Given the extension of the disease I did not attempt a jejunostomy.   No bleeding, bile duct injury or leak, or bowel injury was noted.   Robotic instruments and robotic arms were undocked in the standard fashion.  I scrubbed back in. Liposomal Marcaine was used to infiltrate the abdominal wall at all incision sites   Pneumoperitoneum was released.  The periumbilical port site was closed with interrumpted 0 Vicryl sutures. 4-0 subcuticular Monocryl was used to close the skin. Dermabond was  applied.  The patient was then extubated and brought to the recovery room in stable condition. Sponge, lap, and needle counts were correct at closure and at the conclusion of the case.  Caroleen Hamman, MD, FACS

## 2020-06-12 NOTE — Anesthesia Procedure Notes (Signed)
Procedure Name: Intubation Date/Time: 06/12/2020 1:50 PM Performed by: Joellyn Quails, RN Pre-anesthesia Checklist: Patient identified, Emergency Drugs available, Suction available and Patient being monitored Patient Re-evaluated:Patient Re-evaluated prior to induction Oxygen Delivery Method: Circle system utilized Preoxygenation: Pre-oxygenation with 100% oxygen Induction Type: IV induction Ventilation: Mask ventilation without difficulty Laryngoscope Size: McGraph and 3 Grade View: Grade I Tube type: Oral Tube size: 7.0 mm Number of attempts: 1 Airway Equipment and Method: Stylet and Oral airway Placement Confirmation: ETT inserted through vocal cords under direct vision,  positive ETCO2 and breath sounds checked- equal and bilateral Secured at: 22 cm Tube secured with: Tape Dental Injury: Teeth and Oropharynx as per pre-operative assessment

## 2020-06-12 NOTE — Anesthesia Preprocedure Evaluation (Addendum)
Anesthesia Evaluation  Patient identified by MRN, date of birth, ID band Patient awake    Reviewed: Allergy & Precautions, H&P , NPO status , Patient's Chart, lab work & pertinent test results  History of Anesthesia Complications Negative for: history of anesthetic complications  Airway Mallampati: III  TM Distance: <3 FB Neck ROM: limited    Dental  (+) Poor Dentition, Missing   Pulmonary COPD, Current Smoker and Patient abstained from smoking.,    Pulmonary exam normal        Cardiovascular Exercise Tolerance: Good hypertension, + CAD and + CABG  Normal cardiovascular exam     Neuro/Psych CVA, Residual Symptoms negative psych ROS   GI/Hepatic Neg liver ROS, GERD  ,  Endo/Other  negative endocrine ROS  Renal/GU negative Renal ROS  negative genitourinary   Musculoskeletal   Abdominal   Peds  Hematology negative hematology ROS (+)   Anesthesia Other Findings Past Medical History: No date: CAD (coronary artery disease) No date: Heart disease  Past Surgical History: No date: CORONARY ARTERY BYPASS GRAFT     Comment:  at 65 years old 04/30/2020: PORTA CATH INSERTION; N/A     Comment:  Procedure: PORTA CATH INSERTION;  Surgeon: Algernon Huxley,              MD;  Location: [facility];  Service:               Cardiovascular;  Laterality: N/A; No date: VEIN BYPASS SURGERY  BMI    Body Mass Index: 22.66 kg/m      Reproductive/Obstetrics negative OB ROS                             Anesthesia Physical  Anesthesia Plan  ASA: III  Anesthesia Plan: General   Post-op Pain Management:    Induction: Intravenous  PONV Risk Score and Plan:   Airway Management Planned: Oral ETT  Additional Equipment:   Intra-op Plan:   Post-operative Plan: Extubation in OR  Informed Consent: I have reviewed the patients History and Physical, chart, labs and discussed the procedure  including the risks, benefits and alternatives for the proposed anesthesia with the patient or authorized representative who has indicated his/her understanding and acceptance.     Dental Advisory Given and Dental advisory given  Plan Discussed with: Anesthesiologist, CRNA and Surgeon  Anesthesia Plan Comments: (Patient consented for risks of anesthesia including but not limited to:  - adverse reactions to medications - risk of airway placement if required - damage to eyes, teeth, lips or other oral mucosa - nerve damage due to positioning  - sore throat or hoarseness - Damage to heart, brain, nerves, lungs, other parts of body or loss of life  Patient voiced understanding.)        Anesthesia Quick Evaluation

## 2020-06-12 NOTE — Discharge Instructions (Signed)

## 2020-06-13 ENCOUNTER — Encounter: Payer: Self-pay | Admitting: Surgery

## 2020-06-13 NOTE — Progress Notes (Signed)
Voicemail did not have patients name, no message left.

## 2020-06-15 ENCOUNTER — Inpatient Hospital Stay (HOSPITAL_BASED_OUTPATIENT_CLINIC_OR_DEPARTMENT_OTHER): Payer: 59 | Admitting: Hospice and Palliative Medicine

## 2020-06-15 ENCOUNTER — Inpatient Hospital Stay: Payer: 59

## 2020-06-15 ENCOUNTER — Other Ambulatory Visit: Payer: Self-pay | Admitting: Physician Assistant

## 2020-06-15 ENCOUNTER — Ambulatory Visit: Payer: 59

## 2020-06-15 ENCOUNTER — Inpatient Hospital Stay (HOSPITAL_BASED_OUTPATIENT_CLINIC_OR_DEPARTMENT_OTHER): Payer: 59 | Admitting: Oncology

## 2020-06-15 VITALS — BP 120/73 | HR 74 | Temp 98.4°F | Resp 16 | Wt 132.1 lb

## 2020-06-15 DIAGNOSIS — Z515 Encounter for palliative care: Secondary | ICD-10-CM | POA: Diagnosis not present

## 2020-06-15 DIAGNOSIS — Z5112 Encounter for antineoplastic immunotherapy: Secondary | ICD-10-CM

## 2020-06-15 DIAGNOSIS — E43 Unspecified severe protein-calorie malnutrition: Secondary | ICD-10-CM

## 2020-06-15 DIAGNOSIS — C220 Liver cell carcinoma: Secondary | ICD-10-CM

## 2020-06-15 DIAGNOSIS — E860 Dehydration: Secondary | ICD-10-CM

## 2020-06-15 MED ORDER — SODIUM CHLORIDE 0.9 % IV SOLN
Freq: Once | INTRAVENOUS | Status: AC
Start: 2020-06-15 — End: 2020-06-15
  Filled 2020-06-15: qty 250

## 2020-06-15 MED ORDER — SODIUM CHLORIDE 0.9% FLUSH
10.0000 mL | INTRAVENOUS | Status: DC | PRN
  Administered 2020-06-15: 10 mL via INTRAVENOUS
  Filled 2020-06-15: qty 10

## 2020-06-15 MED ORDER — HYDROCODONE-ACETAMINOPHEN 5-325 MG PO TABS: 1.0000 | ORAL_TABLET | Freq: Four times a day (QID) | ORAL | 0 refills | Status: DC | PRN

## 2020-06-15 MED ORDER — HEPARIN SOD (PORK) LOCK FLUSH 100 UNIT/ML IV SOLN
500.0000 [IU] | Freq: Once | INTRAVENOUS | Status: AC
Start: 2020-06-15 — End: 2020-06-15
  Administered 2020-06-15: 500 [IU] via INTRAVENOUS
  Filled 2020-06-15: qty 5

## 2020-06-15 NOTE — Progress Notes (Signed)
[place]  [phone] [phone]   Name: Maxwell Edwards Date: 06/15/2020 MRN: [id number]  DOB: 04-08-1955  Patient Care Team: Venita Lick, NP as PCP - General (Nurse Practitioner) Clent Jacks, RN as Oncology Nurse Navigator    REASON FOR CONSULTATION: Maxwell Edwards is a 65 y.o. male with multiple medical problems including CAD status post CABG, hypertension, hyperlipidemia, and multifocal locally advanced hepatocellular carcinoma on systemic treatment with Tecentriq and Avastin.  Patient has been symptomatic with nausea, weight loss, and insomnia.  Palliative care was consulted up address goals and manage ongoing symptoms.  SOCIAL HISTORY:     reports that he has been smoking cigarettes. He has been smoking about 0.25 packs per day. He has never used smokeless tobacco. He reports previous alcohol use. He reports previous drug use.  Patient is divorced.  He lives at home alone.  His ex-wife is still involved in his care as are his 2 daughters.  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 Barrett esophagus   . CAD (coronary artery disease)   . Cancer [facility])    liver cancer  . Cirrhosis ([place])   . Heart disease     PAST SURGICAL HISTORY:  Past Surgical History:  Procedure Laterality Date  . CORONARY ARTERY BYPASS GRAFT     at 65 years old  . ESOPHAGOGASTRODUODENOSCOPY (EGD) WITH PROPOFOL N/A 05/21/2020   Procedure: ESOPHAGOGASTRODUODENOSCOPY (EGD) WITH PROPOFOL;  Surgeon: Lin Landsman, MD;  Location: [facility];  Service: Gastroenterology;  Laterality: N/A;  . LAPAROSCOPIC LYSIS OF ADHESIONS  06/12/2020   Procedure: LAPAROSCOPIC LYSIS OF ADHESIONS;  Surgeon: Jules Husbands, MD;  Location: [facility];   Service: General;;  . PORTA CATH INSERTION N/A 04/30/2020   Procedure: PORTA CATH INSERTION;  Surgeon: Algernon Huxley, MD;  Location: [facility];  Service: Cardiovascular;  Laterality: N/A;  . VEIN BYPASS SURGERY      HEMATOLOGY/ONCOLOGY HISTORY:  Oncology History  Cancer, hepatocellular ([place])  04/20/2020 Cancer Staging   Staging form: Liver, AJCC 8th Edition - Clinical stage from 04/20/2020: Stage IIIB (cT4, cN0, cM0) - Signed by Sindy Guadeloupe, MD on 04/21/2020   04/21/2020 Initial Diagnosis   Cancer, hepatocellular ([place])   05/10/2020 -  Chemotherapy    Patient is on Treatment Plan: LIVER ATEZOLIZUMAB + BEVACIZUMAB Q21D        ALLERGIES:  has No Known Allergies.  MEDICATIONS:  Current Outpatient Medications  Medication Sig Dispense Refill  . HYDROcodone-acetaminophen (NORCO/VICODIN) 5-325 MG tablet Take 1-2 tablets by mouth every 6 (six) hours as needed for moderate pain. 30 tablet 0  . lactulose (CHRONULAC) 10 GM/15ML solution Take 15 mLs (10 g total) by mouth daily. (Patient not taking: No sig reported) 236 mL 0  . lidocaine-prilocaine (EMLA) cream Apply to affected area once (Patient taking differently: Apply 1 application topically See admin instructions. Apply to affected area once as needed) 30 g 3  . methylphenidate (RITALIN) 5 MG tablet Take 1 tablet (5 mg total) by mouth daily with breakfast. 30 tablet 0  . mirtazapine (REMERON) 15 MG tablet Take 1 tablet (15 mg total) by mouth at bedtime. 30 tablet 2  . ondansetron (ZOFRAN) 8 MG tablet Take 1 tablet (8 mg total) by mouth 2 (  two) times daily as needed (Nausea or vomiting). 45 tablet 2  . prochlorperazine (COMPAZINE) 10 MG tablet Take 1 tablet (10 mg total) by mouth every 6 (six) hours as needed (Nausea or vomiting). (Patient not taking: No sig reported) 30 tablet 1   No current facility-administered medications for this visit.   Facility-Administered Medications Ordered in Other Visits  Medication Dose Route  Frequency Provider Last Rate Last Admin  . heparin lock flush 100 unit/mL  500 Units Intravenous Once Sindy Guadeloupe, MD      . sodium chloride flush (NS) 0.9 % injection 10 mL  10 mL Intravenous PRN Sindy Guadeloupe, MD   10 mL at 06/15/20 1004    VITAL SIGNS: There were no vitals taken for this visit. There were no vitals filed for this visit.  Estimated body mass index is 19.51 kg/m as calculated from the following:   Height as of 06/12/20: 5\' 9"  (1.753 m).   Weight as of an earlier encounter on 06/15/20: 132 lb 1.6 oz (59.9 kg).  LABS: CBC:    Component Value Date/Time   WBC 5.9 06/11/2020 1100   HGB 12.2 (L) 06/11/2020 1100   HGB 18.3 (H) 04/06/2020 1152   HCT 37.3 (L) 06/11/2020 1100   HCT 53.8 (H) 04/06/2020 1152   PLT 173 06/11/2020 1100   PLT 247 04/06/2020 1152   MCV 99.2 06/11/2020 1100   MCV 94 04/06/2020 1152   NEUTROABS 3.9 06/11/2020 1100   NEUTROABS 7.1 (H) 04/06/2020 1152   LYMPHSABS 1.5 06/11/2020 1100   LYMPHSABS 2.1 04/06/2020 1152   MONOABS 0.4 06/11/2020 1100   EOSABS 0.1 06/11/2020 1100   EOSABS 0.1 04/06/2020 1152   BASOSABS 0.1 06/11/2020 1100   BASOSABS 0.1 04/06/2020 1152   Comprehensive Metabolic Panel:    Component Value Date/Time   NA 135 06/11/2020 1100   NA 135 04/06/2020 1152   K 4.9 06/11/2020 1100   CL 99 06/11/2020 1100   CO2 26 06/11/2020 1100   BUN 14 06/11/2020 1100   BUN 15 04/06/2020 1152   CREATININE 0.87 06/11/2020 1100   GLUCOSE 116 (H) 06/11/2020 1100   CALCIUM 8.7 (L) 06/11/2020 1100   AST 68 (H) 06/11/2020 1100   ALT 46 (H) 06/11/2020 1100   ALKPHOS 319 (H) 06/11/2020 1100   BILITOT 1.0 06/11/2020 1100   BILITOT 0.6 04/06/2020 1152   PROT 7.1 06/11/2020 1100   PROT 8.2 04/06/2020 1152   ALBUMIN 2.8 (L) 06/11/2020 1100   ALBUMIN 4.3 04/06/2020 1152    RADIOGRAPHIC STUDIES: No results found.  PERFORMANCE STATUS (ECOG) : 1 - Symptomatic but completely ambulatory  Review of Systems Unless otherwise noted, a  complete review of systems is negative.  Physical Exam General: Frail appearing Pulmonary: Unlabored Extremities: no edema, no joint deformities Skin: no rashes Neurological: Weakness but otherwise nonfocal  IMPRESSION: Patient seen while he was receiving IV fluids.  He was accompanied by his ex-wife.  PEG insertion was attempted by general surgeon on 4//22.  However, liver cancer significantly impaired stomach mobility and anatomy was not amenable to PEG insertion.  Family report that patient initially had improved oral intake following procedure.  However, it sounds like patient was still only eating bites and sips.  He continues to have progressive weight loss.  Performance status is poor.  Patient spends most of the day in the bed.  I again attempted to discuss overall goals with patient.  He has not very forthcoming with answers.  His daughter  did present the option of just focusing on comfort until his end-of-life.  Patient responded "I guess I will continue fighting."  PLAN: -Continue current scope of treatment -Referral to nutrition -RTC next week  Discussed with Dr. Janese Banks, nursing, and nutrition  Patient expressed understanding and was in agreement with this plan. He also understands that He can call the clinic at any time with any questions, concerns, or complaints.     Time Total: 25 minutes  Visit consisted of counseling and education dealing with the complex and emotionally intense issues of symptom management and palliative care in the setting of serious and potentially life-threatening illness.Greater than 50%  of this time was spent counseling and coordinating care related to the above assessment and plan.  Signed by: Altha Harm, PhD, NP-C

## 2020-06-15 NOTE — Progress Notes (Signed)
Pt receiving 1 liter of NS. There was uncertainty regarding phlebotomy. Pt agreed to phlebotomy when liter finishing up. Additional 500 ml NS given per verbal order of Dr Janese Banks to complete phlebotomy. Removed 500 ml of blood successfully. VSS. Discharged to home.

## 2020-06-15 NOTE — Progress Notes (Signed)
Pt was not able to get G tube d/t enlarging liver. Had a day or 2 where he ate a small amount and drank a small amount. Inconsistent taking in any po's. Continues to lose weight. Uncertainty as to continue phlebotomies.

## 2020-06-15 NOTE — Progress Notes (Signed)
Hematology/Oncology Consult note [facility]  [phone] [phone]  Patient Care Team: Venita Lick, NP as PCP - General (Nurse Practitioner) Clent Jacks, RN as Oncology Nurse Navigator   Name of the patient: Maxwell Edwards  [id number]  03-Oct-1955   Date of visit: 06/15/20  Diagnosis- multifocal locally advanced hepatocellular carcinoma Hereditary hemochromatosis  Chief complaint/ Reason for visit-discuss goals of care  Heme/Onc history: patient is a 65 year old male with a past medical history significant for hypertension hyperlipidemia, 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 Reports that he has lost about 20 pounds over the last 2 to 3 months. He is a chronic smoker which prompted a CT chest. CT chest without contrast on 04/06/2020 showed no evidence of mediastinal or hilar adenopathy. He was found to have 4 subcentimeter lung nodules 3 in the right lobe and one in the left upper lobe which were borderline for metastatic disease. Cirrhotic liver morphology with 8.3 x 7 cm ill-defined hypoattenuating lesion in the left liver with a 3.3 cm lesion in the dome of the liver near the junction of the right and left hepatic lobes. Labs showed elevated AST and ALT of 62 and 47 respectively with a normal albumin and normal total bilirubin. CMP showed elevated H&H of 18.3/53.8. 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 Another mass in the segment 4A measuring 3.4 x 2.9 cm.No evidence of local regional adenopathy.  Alpha-fetoprotein elevated at 38,000. CA 19-9 elevated at 147.Patient's case discussed at tumor board and consensus was  that this would be consistent with [facility] especially given the AFP value. Initially MRI was not characteristic for [facility] but a biopsy was not deemed necessary after AFP results were back   Interval history-PEG tube could not be placed as the liver cancer was involving the lesser curvature of stomach.  Patient's appetite and oral intake continues to be poor.  He spends most of his day resting and eating poorly.  He hardly verbalizes during my visit with him and most of the history is obtained from his ex-wife and patient's daughter.  ECOG PS- 2-3 Pain scale- 0   Review of systems- Review of Systems  Unable to perform ROS: Other  Limited as patient does not verbalize much during his visit with me    No Known Allergies   Past Medical History:  Diagnosis Date  . Barrett esophagus   . CAD (coronary artery disease)   . Cancer [facility])    liver cancer  . Cirrhosis ([place])   . Heart disease      Past Surgical History:  Procedure Laterality Date  . CORONARY ARTERY BYPASS GRAFT     at 65 years old  . ESOPHAGOGASTRODUODENOSCOPY (EGD) WITH PROPOFOL N/A 05/21/2020   Procedure: ESOPHAGOGASTRODUODENOSCOPY (EGD) WITH PROPOFOL;  Surgeon: Lin Landsman, MD;  Location: [place];  Service: Gastroenterology;  Laterality: N/A;  . LAPAROSCOPIC LYSIS OF ADHESIONS  06/12/2020   Procedure: LAPAROSCOPIC LYSIS OF ADHESIONS;  Surgeon: Jules Husbands, MD;  Location: [facility];  Service: General;;  . PORTA CATH INSERTION N/A 04/30/2020   Procedure: PORTA CATH INSERTION;  Surgeon: Algernon Huxley, MD;  Location: [place] [facility];  Service: Cardiovascular;  Laterality: N/A;  . VEIN BYPASS SURGERY      Social History   Socioeconomic History  . Marital status: Divorced    Spouse name: Not on file  . Number of children: 2  . Years of education: Not on file  . Highest education level: Not on file  Occupational History  . 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[employer]  . Smoking  status: Current Some Day Smoker    Packs/day: 0.25    Types: Cigarettes  . Smokeless tobacco: Never Used  Vaping Use  . Vaping Use: Never used  Substance and Sexual Activity  . Alcohol use: Not Currently  . Drug use: Not Currently  . Sexual activity: Not Currently  Other Topics Concern  . Not on file  Social History Narrative   Lives by himself    Social Determinants of Health   Financial Resource Strain: Not on file  Food Insecurity: Not on file  Transportation Needs: Not on file  Physical Activity: Not on file  Stress: Not on file  Social Connections: Not on file  Intimate Partner Violence: Not on file    Family History  Problem Relation Age of Onset  . Dementia Mother   . Ulcers Father   . Heart attack Brother   . Throat cancer Brother   . Heart attack Daughter   . Diabetes Daughter   . Depression Daughter      Current Outpatient Medications:  .  HYDROcodone-acetaminophen (NORCO/VICODIN) 5-325 MG tablet, Take 1-2 tablets by mouth every 6 (six) hours as needed for moderate pain., Disp: 30 tablet, Rfl: 0 .  lidocaine-prilocaine (EMLA) cream, Apply to affected area once (Patient taking differently: Apply 1 application topically See admin instructions. Apply to affected area once as needed), Disp: 30 g, Rfl: 3 .  methylphenidate (RITALIN) 5 MG tablet, Take 1 tablet (5 mg total) by mouth daily with breakfast., Disp: 30 tablet, Rfl: 0 .  mirtazapine (REMERON) 15 MG tablet, Take 1 tablet (15 mg total) by mouth at bedtime., Disp: 30 tablet, Rfl: 2 .  ondansetron (ZOFRAN) 8 MG tablet, Take 1 tablet (8 mg total) by mouth 2 (two) times daily as needed (Nausea or vomiting)., Disp: 45 tablet, Rfl: 2 .  lactulose (CHRONULAC) 10 GM/15ML solution, Take 15 mLs (10 g total) by mouth daily. (Patient not taking: No sig reported), Disp: 236 mL, Rfl: 0 .  prochlorperazine (COMPAZINE) 10 MG tablet, Take 1 tablet (10 mg total) by mouth every 6 (six) hours as needed (Nausea or vomiting).  (Patient not taking: No sig reported), Disp: 30 tablet, Rfl: 1 No current facility-administered medications for this visit.  Facility-Administered Medications Ordered in Other Visits:  .  sodium chloride flush (NS) 0.9 % injection 10 mL, 10 mL, Intravenous, PRN, Sindy Guadeloupe, MD, 10 mL at 06/15/20 1004  Physical exam:  Vitals:   06/15/20 1029  BP: 118/81  Pulse: 83  Resp: 17  Temp: 98.4 F (36.9 C)  TempSrc: Tympanic  SpO2: 100%  Weight: 132 lb 1.6 oz (59.9 kg)   Physical Exam Constitutional:      General: He is not in acute distress.    Comments: He is thin and cachectic.  Appears fatigued  Cardiovascular:     Rate and Rhythm: Normal rate and regular rhythm.     Heart sounds: Normal heart sounds.  Pulmonary:     Effort: Pulmonary effort is normal.     Breath sounds: Normal breath  sounds.  Abdominal:     Palpations: Abdomen is soft.  Skin:    General: Skin is warm and dry.  Neurological:     Mental Status: He is alert and oriented to person, place, and time.      CMP Latest Ref Rng & Units 06/11/2020  Glucose 70 - 99 mg/dL 116(H)  BUN 8 - 23 mg/dL 14  Creatinine 0.61 - 1.24 mg/dL 0.87  Sodium 135 - 145 mmol/L 135  Potassium 3.5 - 5.1 mmol/L 4.9  Chloride 98 - 111 mmol/L 99  CO2 22 - 32 mmol/L 26  Calcium 8.9 - 10.3 mg/dL 8.7(L)  Total Protein 6.5 - 8.1 g/dL 7.1  Total Bilirubin 0.3 - 1.2 mg/dL 1.0  Alkaline Phos 38 - 126 U/L 319(H)  AST 15 - 41 U/L 68(H)  ALT 0 - 44 U/L 46(H)   CBC Latest Ref Rng & Units 06/11/2020  WBC 4.0 - 10.5 K/uL 5.9  Hemoglobin 13.0 - 17.0 g/dL 12.2(L)  Hematocrit 39.0 - 52.0 % 37.3(L)  Platelets 150 - 400 K/uL 173     Assessment and plan- Patient is a 65 y.o. male withstage IIIb multifocal locally advanced hepatocellular carcinoma cT4 N0 M0. He is here for phlebotomy and to discuss ongoing goals of care  Hereditary hemochromatosis: Patient has been receiving phlebotomy twice a week and since then his LFTs have improved and his  bilirubin is normal.  We will continue once or twice a week phlebotomies as tolerated.  Locally advanced hepatocellular carcinoma: Likely with gastric involvement noted at the time of PEG tube placement which could not be materialized.Patient has received 2 cycles of Tecentriq and Avastin so far but his AFP has continued to grow up from [id number] to [id number].  I will plan to give him 1 more treatment next week followed by repeat CT chest abdomen pelvis with contrast.  If there is evidence of radiological progression, I do not recommend second line therapy in view of patient's declining performance status and ongoing weight loss.  Home hospice would be appropriate at that time.  Patient will come twice a week for phlebotomy next week along with cycle 3 of Tecentriq and Avastin and I will see her back in 2 weeks to discuss CT scan results and further management.  He is also being followed by palliative care   Visit Diagnosis 1. Hereditary hemochromatosis ([place])   2. Cancer, hepatocellular ([place])   3. Encounter for monoclonal antibody treatment for malignancy   4. Encounter for antineoplastic immunotherapy   5. Severe protein-calorie malnutrition ([place])      Dr. Randa Evens, MD, MPH [facility] [phone] 06/15/2020 1:04 PM

## 2020-06-15 NOTE — Progress Notes (Signed)
Nutrition Follow-up:  Patient with advanced hepatocellular cancer.  G-tube not placed due to massive hepatocellular carcinoma left lobe of liver and stomach unable to reach abdominal wall due to liver cancer per surgery's note.    Met with patient and Max during fluids and phlebotomy.  Patient taking sips and bites at best.  Max says that patient has done mouth care more in the last few days.  Patient is drinking whole milk as has pushed away ensure shakes recently.  All kinds of foods and snacks have been made available to patient.    Palliative care following.  Plan of care to be deterimined  Medications: reviewed  Labs: reviewed  Anthropometrics:   Weight 132 lb today  145 lb (date of G-tube procedure, unsure if this was reported weight or actual weight)  134 lb on 4/4 145 lb on 3/22  9% weight loss in the last 3 weeks, significant  Estimated Energy Needs  Kcals: 2000-2400 Protein: 100-120 g Fluid: > 2 L  NUTRITION DIAGNOSIS: Inadequate oral intake continues   MALNUTRITION DIAGNOSIS:  Patient meets criteria of severe malnutrition in context of acute illness, likely progressing to chronic illness as evidenced by 9% weight loss in 3 weeks and eating less than 50% of estimated energy needs for > or equal to 5 days.    INTERVENTION:  Patient currently is not meeting nutritional needs. Plan of care discussions ongoing with MD and Palliative care.   Max verbalized that she is aware of how to increase calories and protein.  RD has provided education on past visit.  Oral nutrition supplements as able RD available as needed and patient and Max know how to reach RD.      MONITORING, EVALUATION, GOAL: weight trends, intake   NEXT VISIT: as needed   B. , RD, LDN Registered Dietitian [phone] (mobile)     

## 2020-06-19 ENCOUNTER — Encounter: Payer: Self-pay | Admitting: Oncology

## 2020-06-19 ENCOUNTER — Inpatient Hospital Stay: Payer: 59

## 2020-06-19 ENCOUNTER — Telehealth: Payer: Self-pay | Admitting: Oncology

## 2020-06-19 ENCOUNTER — Other Ambulatory Visit: Payer: 59

## 2020-06-19 ENCOUNTER — Other Ambulatory Visit: Payer: Self-pay | Admitting: *Deleted

## 2020-06-19 ENCOUNTER — Encounter: Payer: 59 | Admitting: Hospice and Palliative Medicine

## 2020-06-19 ENCOUNTER — Ambulatory Visit: Payer: 59 | Admitting: Oncology

## 2020-06-19 ENCOUNTER — Ambulatory Visit: Payer: 59

## 2020-06-19 VITALS — BP 115/81 | HR 92 | Temp 96.8°F | Resp 20

## 2020-06-19 DIAGNOSIS — C220 Liver cell carcinoma: Secondary | ICD-10-CM

## 2020-06-19 DIAGNOSIS — Z5112 Encounter for antineoplastic immunotherapy: Secondary | ICD-10-CM | POA: Diagnosis not present

## 2020-06-19 LAB — CBC WITH DIFFERENTIAL/PLATELET
Abs Immature Granulocytes: 0.02 10*3/uL (ref 0.00–0.07)
Basophils Absolute: 0.1 10*3/uL (ref 0.0–0.1)
Basophils Relative: 1 %
Eosinophils Absolute: 0 10*3/uL (ref 0.0–0.5)
Eosinophils Relative: 1 %
HCT: 37.3 % — ABNORMAL LOW (ref 39.0–52.0)
Hemoglobin: 12.6 g/dL — ABNORMAL LOW (ref 13.0–17.0)
Immature Granulocytes: 0 %
Lymphocytes Relative: 26 %
Lymphs Abs: 1.7 10*3/uL (ref 0.7–4.0)
MCH: 33.3 pg (ref 26.0–34.0)
MCHC: 33.8 g/dL (ref 30.0–36.0)
MCV: 98.7 fL (ref 80.0–100.0)
Monocytes Absolute: 0.6 10*3/uL (ref 0.1–1.0)
Monocytes Relative: 9 %
Neutro Abs: 4.1 10*3/uL (ref 1.7–7.7)
Neutrophils Relative %: 63 %
Platelets: 277 10*3/uL (ref 150–400)
RBC: 3.78 MIL/uL — ABNORMAL LOW (ref 4.22–5.81)
RDW: 15.9 % — ABNORMAL HIGH (ref 11.5–15.5)
WBC: 6.4 10*3/uL (ref 4.0–10.5)
nRBC: 0 % (ref 0.0–0.2)

## 2020-06-19 LAB — COMPREHENSIVE METABOLIC PANEL
ALT: 32 U/L (ref 0–44)
AST: 51 U/L — ABNORMAL HIGH (ref 15–41)
Albumin: 3.2 g/dL — ABNORMAL LOW (ref 3.5–5.0)
Alkaline Phosphatase: 290 U/L — ABNORMAL HIGH (ref 38–126)
Anion gap: 12 (ref 5–15)
BUN: 18 mg/dL (ref 8–23)
CO2: 27 mmol/L (ref 22–32)
Calcium: 9.2 mg/dL (ref 8.9–10.3)
Chloride: 95 mmol/L — ABNORMAL LOW (ref 98–111)
Creatinine, Ser: 0.79 mg/dL (ref 0.61–1.24)
GFR, Estimated: >60 mL/min (ref >60)
Glucose, Bld: 111 mg/dL — ABNORMAL HIGH (ref 70–99)
Potassium: 4.3 mmol/L (ref 3.5–5.1)
Sodium: 134 mmol/L — ABNORMAL LOW (ref 135–145)
Total Bilirubin: 0.9 mg/dL (ref 0.3–1.2)
Total Protein: 7.5 g/dL (ref 6.5–8.1)

## 2020-06-19 MED ORDER — HEPARIN SOD (PORK) LOCK FLUSH 100 UNIT/ML IV SOLN
500.0000 [IU] | Freq: Once | INTRAVENOUS | Status: AC
Start: 2020-06-19 — End: 2020-06-19
  Administered 2020-06-19: 500 [IU] via INTRAVENOUS
  Filled 2020-06-19: qty 5

## 2020-06-19 MED ORDER — SODIUM CHLORIDE 0.9% FLUSH
10.0000 mL | Freq: Once | INTRAVENOUS | Status: AC
  Administered 2020-06-19: 10 mL via INTRAVENOUS
  Filled 2020-06-19: qty 10

## 2020-06-19 MED ORDER — HEPARIN SOD (PORK) LOCK FLUSH 100 UNIT/ML IV SOLN
INTRAVENOUS | Status: AC
  Filled 2020-06-19: qty 5

## 2020-06-19 MED ORDER — SODIUM CHLORIDE 0.9 % IV SOLN
Freq: Once | INTRAVENOUS | Status: AC
  Filled 2020-06-19: qty 250

## 2020-06-19 NOTE — Telephone Encounter (Signed)
Left VM with patient to remind him of appointment time change on 4/15. Left direct # to return phone call with any questions.

## 2020-06-22 ENCOUNTER — Ambulatory Visit: Payer: 59 | Admitting: Oncology

## 2020-06-22 ENCOUNTER — Encounter: Payer: 59 | Admitting: Hospice and Palliative Medicine

## 2020-06-22 ENCOUNTER — Inpatient Hospital Stay: Payer: 59

## 2020-06-22 VITALS — BP 104/67 | HR 90 | Temp 97.5°F | Resp 20

## 2020-06-22 DIAGNOSIS — Z5112 Encounter for antineoplastic immunotherapy: Secondary | ICD-10-CM | POA: Diagnosis not present

## 2020-06-22 DIAGNOSIS — C220 Liver cell carcinoma: Secondary | ICD-10-CM

## 2020-06-22 LAB — URINALYSIS, DIPSTICK ONLY
Bilirubin Urine: NEGATIVE
Glucose, UA: NEGATIVE mg/dL
Ketones, ur: NEGATIVE mg/dL
Leukocytes,Ua: NEGATIVE
Nitrite: NEGATIVE
Protein, ur: 100 mg/dL — AB
Specific Gravity, Urine: 1.02 (ref 1.005–1.030)
pH: 6 (ref 5.0–8.0)

## 2020-06-22 MED ORDER — SODIUM CHLORIDE 0.9 % IV SOLN
1200.0000 mg | Freq: Once | INTRAVENOUS | Status: AC
  Administered 2020-06-22: 1200 mg via INTRAVENOUS
  Filled 2020-06-22: qty 20

## 2020-06-22 MED ORDER — HEPARIN SOD (PORK) LOCK FLUSH 100 UNIT/ML IV SOLN
500.0000 [IU] | Freq: Once | INTRAVENOUS | Status: AC | PRN
  Administered 2020-06-22: 500 [IU]
  Filled 2020-06-22: qty 5

## 2020-06-22 MED ORDER — SODIUM CHLORIDE 0.9 % IV SOLN
15.0000 mg/kg | Freq: Once | INTRAVENOUS | Status: AC
  Administered 2020-06-22: 1100 mg via INTRAVENOUS
  Filled 2020-06-22: qty 32

## 2020-06-22 MED ORDER — SODIUM CHLORIDE 0.9 % IV SOLN
Freq: Once | INTRAVENOUS | Status: AC
Start: 2020-06-22 — End: 2020-06-22
  Filled 2020-06-22: qty 250

## 2020-06-22 MED ORDER — HEPARIN SOD (PORK) LOCK FLUSH 100 UNIT/ML IV SOLN
INTRAVENOUS | Status: AC
  Filled 2020-06-22: qty 5

## 2020-06-22 MED ORDER — SODIUM CHLORIDE 0.9% FLUSH
10.0000 mL | INTRAVENOUS | Status: DC | PRN
  Administered 2020-06-22: 10 mL
  Filled 2020-06-22: qty 10

## 2020-06-22 MED ORDER — BEVACIZUMAB CHEMO INJECTION 400 MG/16ML
15.0000 mg/kg | Freq: Once | INTRAVENOUS | Status: DC
Start: 2020-06-22 — End: 2020-06-22

## 2020-06-22 MED ORDER — SODIUM CHLORIDE 0.9 % IV SOLN
Freq: Once | INTRAVENOUS | Status: AC
  Filled 2020-06-22: qty 250

## 2020-06-23 ENCOUNTER — Encounter: Payer: Self-pay | Admitting: Oncology

## 2020-06-26 ENCOUNTER — Ambulatory Visit: Payer: 59

## 2020-06-27 ENCOUNTER — Encounter: Payer: Self-pay | Admitting: Oncology

## 2020-06-28 ENCOUNTER — Encounter: Payer: Self-pay | Admitting: Oncology

## 2020-06-28 ENCOUNTER — Inpatient Hospital Stay: Payer: 59

## 2020-06-28 ENCOUNTER — Inpatient Hospital Stay (HOSPITAL_BASED_OUTPATIENT_CLINIC_OR_DEPARTMENT_OTHER): Payer: 59 | Admitting: Oncology

## 2020-06-28 VITALS — BP 107/87 | HR 105 | Temp 98.4°F | Resp 17 | Wt 128.0 lb

## 2020-06-28 DIAGNOSIS — C220 Liver cell carcinoma: Secondary | ICD-10-CM | POA: Diagnosis not present

## 2020-06-28 DIAGNOSIS — Z5112 Encounter for antineoplastic immunotherapy: Secondary | ICD-10-CM | POA: Diagnosis not present

## 2020-06-28 LAB — COMPREHENSIVE METABOLIC PANEL
ALT: 40 U/L (ref 0–44)
AST: 61 U/L — ABNORMAL HIGH (ref 15–41)
Albumin: 3 g/dL — ABNORMAL LOW (ref 3.5–5.0)
Alkaline Phosphatase: 309 U/L — ABNORMAL HIGH (ref 38–126)
Anion gap: 11 (ref 5–15)
BUN: 15 mg/dL (ref 8–23)
CO2: 26 mmol/L (ref 22–32)
Calcium: 9.1 mg/dL (ref 8.9–10.3)
Chloride: 98 mmol/L (ref 98–111)
Creatinine, Ser: 0.91 mg/dL (ref 0.61–1.24)
GFR, Estimated: >60 mL/min (ref >60)
Glucose, Bld: 111 mg/dL — ABNORMAL HIGH (ref 70–99)
Potassium: 4.1 mmol/L (ref 3.5–5.1)
Sodium: 135 mmol/L (ref 135–145)
Total Bilirubin: 0.8 mg/dL (ref 0.3–1.2)
Total Protein: 7.3 g/dL (ref 6.5–8.1)

## 2020-06-28 LAB — CBC WITH DIFFERENTIAL/PLATELET
Abs Immature Granulocytes: 0.03 10*3/uL (ref 0.00–0.07)
Basophils Absolute: 0.1 10*3/uL (ref 0.0–0.1)
Basophils Relative: 1 %
Eosinophils Absolute: 0.1 10*3/uL (ref 0.0–0.5)
Eosinophils Relative: 1 %
HCT: 35.4 % — ABNORMAL LOW (ref 39.0–52.0)
Hemoglobin: 11.9 g/dL — ABNORMAL LOW (ref 13.0–17.0)
Immature Granulocytes: 0 %
Lymphocytes Relative: 25 %
Lymphs Abs: 1.8 10*3/uL (ref 0.7–4.0)
MCH: 34.2 pg — ABNORMAL HIGH (ref 26.0–34.0)
MCHC: 33.6 g/dL (ref 30.0–36.0)
MCV: 101.7 fL — ABNORMAL HIGH (ref 80.0–100.0)
Monocytes Absolute: 0.6 10*3/uL (ref 0.1–1.0)
Monocytes Relative: 8 %
Neutro Abs: 4.6 10*3/uL (ref 1.7–7.7)
Neutrophils Relative %: 65 %
Platelets: 269 10*3/uL (ref 150–400)
RBC: 3.48 MIL/uL — ABNORMAL LOW (ref 4.22–5.81)
RDW: 15.7 % — ABNORMAL HIGH (ref 11.5–15.5)
WBC: 7.1 10*3/uL (ref 4.0–10.5)
nRBC: 0 % (ref 0.0–0.2)

## 2020-06-28 MED ORDER — HEPARIN SOD (PORK) LOCK FLUSH 100 UNIT/ML IV SOLN
500.0000 [IU] | Freq: Once | INTRAVENOUS | Status: AC
  Administered 2020-06-28: 500 [IU] via INTRAVENOUS
  Filled 2020-06-28: qty 5

## 2020-06-28 MED ORDER — SODIUM CHLORIDE 0.9% FLUSH
10.0000 mL | INTRAVENOUS | Status: DC | PRN
  Administered 2020-06-28: 10 mL via INTRAVENOUS
  Filled 2020-06-28: qty 10

## 2020-06-28 MED ORDER — SODIUM CHLORIDE 0.9 % IV SOLN
Freq: Once | INTRAVENOUS | Status: AC
Start: 2020-06-28 — End: 2020-06-28
  Filled 2020-06-28: qty 250

## 2020-06-28 NOTE — Progress Notes (Signed)
Patient receiving 1 liter of NS via port access over 1 hour. He received a 500 ml therapeutic phlebotomy via PIC R antecubital tolerated very well. Parameters are to hold phlebotomy if HCT < 11.5. Sipping on water in clinic. Denies pain or nausea. Discharged to home. VSS. No complaints at time of discharge.

## 2020-06-28 NOTE — Progress Notes (Signed)
Pt reports drinking approximately 8 oz fluid per day mostly milk and water. Ate 3-4 chips this am. After a couple of bites food tastes bad. Pt is fatigued with  freq naps. Takes a pain pill only at bedtime and sleeps through the night. He has intermittent aching in abd more on the right side. Here for MD visit, IVF's and phlebotomy.

## 2020-06-29 ENCOUNTER — Encounter: Payer: Self-pay | Admitting: Oncology

## 2020-06-29 LAB — AFP TUMOR MARKER: AFP, Serum, Tumor Marker: 39666 ng/mL — ABNORMAL HIGH (ref 0.0–8.4)

## 2020-06-30 ENCOUNTER — Encounter: Payer: Self-pay | Admitting: Oncology

## 2020-06-30 NOTE — Progress Notes (Signed)
Hematology/Oncology Consult note [facility]  Telephone:([phone] [phone]  Patient Care Team: Venita Lick, NP as PCP - General (Nurse Practitioner) Clent Jacks, RN as Oncology Nurse Navigator Sindy Guadeloupe, MD as Consulting Physician (Hematology and Oncology)   Name of the patient: Maxwell Edwards  [id number]  1955-12-25   Date of visit: 06/30/20  Diagnosis- multifocal locally advanced hepatocellular carcinoma Hereditary hemochromatosis  Chief complaint/ Reason for visit-routine follow-up of hepatocellular carcinoma and hereditary hemochromatosis  Heme/Onc history: patient is a 64 year old male with a past medical history significant for hypertension hyperlipidemia, 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 Reports that he has lost about 20 pounds over the last 2 to 3 months. He is a chronic smoker which prompted a CT chest. CT chest without contrast on 04/06/2020 showed no evidence of mediastinal or hilar adenopathy. He was found to have 4 subcentimeter lung nodules 3 in the right lobe and one in the left upper lobe which were borderline for metastatic disease. Cirrhotic liver morphology with 8.3 x 7 cm ill-defined hypoattenuating lesion in the left liver with a 3.3 cm lesion in the dome of the liver near the junction of the right and left hepatic lobes. Labs showed elevated AST and ALT of 62 and 47 respectively with a normal albumin and normal total bilirubin. CMP showed elevated H&H of 18.3/53.8. 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 Another mass in the segment 4A measuring 3.4 x 2.9 cm.No evidence of local regional  adenopathy.  Alpha-fetoprotein elevated at 38,000. CA 19-9 elevated at 147.Patient's case discussed at tumor board and consensus was that this would be consistent with [facility] especially given the AFP value. Initially MRI was not characteristic for [facility] but a biopsy was not deemed necessary after AFP results were back   Interval history-patient's family reports that  he has been having more energy and doing better over the last 2 weeks.  Appetite is still poor and he continues to lose weight.  ECOG PS- 2 Pain scale- 0   Review of systems- Review of Systems  Constitutional: Positive for malaise/fatigue. Negative for chills, fever and weight loss.       Loss of appetitite  HENT: Negative for congestion, ear discharge and nosebleeds.   Eyes: Negative for blurred vision.  Respiratory: Negative for cough, hemoptysis, sputum production, shortness of breath and wheezing.   Cardiovascular: Negative for chest pain, palpitations, orthopnea and claudication.  Gastrointestinal: Negative for abdominal pain, blood in stool, constipation, diarrhea, heartburn, melena, nausea and vomiting.  Genitourinary: Negative for dysuria, flank pain, frequency, hematuria and urgency.  Musculoskeletal: Negative for back pain, joint pain and myalgias.  Skin: Negative for rash.  Neurological: Negative for dizziness, tingling, focal weakness, seizures, weakness and headaches.  Endo/Heme/Allergies: Does not bruise/bleed easily.  Psychiatric/Behavioral: Negative for depression and suicidal ideas. The patient does not have insomnia.       No Known Allergies   Past Medical History:  Diagnosis Date  . Barrett esophagus   . CAD (coronary artery disease)   . Cancer [facility])    liver cancer  . Cirrhosis ([place])   . Heart disease      Past Surgical  History:  Procedure Laterality Date  . CORONARY ARTERY BYPASS GRAFT     at 65 years old  . ESOPHAGOGASTRODUODENOSCOPY (EGD) WITH PROPOFOL N/A 05/21/2020   Procedure:  ESOPHAGOGASTRODUODENOSCOPY (EGD) WITH PROPOFOL;  Surgeon: Lin Landsman, MD;  Location: [place];  Service: Gastroenterology;  Laterality: N/A;  . LAPAROSCOPIC LYSIS OF ADHESIONS  06/12/2020   Procedure: LAPAROSCOPIC LYSIS OF ADHESIONS;  Surgeon: Jules Husbands, MD;  Location: [facility] ORS;  Service: General;;  . PORTA CATH INSERTION N/A 04/30/2020   Procedure: PORTA CATH INSERTION;  Surgeon: Algernon Huxley, MD;  Location: [place] CV LAB;  Service: Cardiovascular;  Laterality: N/A;  . VEIN BYPASS SURGERY      Social History   Socioeconomic History  . Marital status: Divorced    Spouse name: Not on file  . Number of children: 2  . Years of education: Not on file  . Highest education level: Not on file  Occupational History  . 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[employer]  . Smoking status: Current Some Day Smoker    Packs/day: 0.25    Types: Cigarettes  . Smokeless tobacco: Never Used  Vaping Use  . Vaping Use: Never used  Substance and Sexual Activity  . Alcohol use: Not Currently  . Drug use: Not Currently  . Sexual activity: Not Currently  Other Topics Concern  . Not on file  Social History Narrative   Lives by himself    Social Determinants of Health   Financial Resource Strain: Not on file  Food Insecurity: Not on file  Transportation Needs: Not on file  Physical Activity: Not on file  Stress: Not on file  Social Connections: Not on file  Intimate Partner Violence: Not on file    Family History  Problem Relation Age of Onset  . Dementia Mother   . Ulcers Father   . Heart attack Brother   . Throat cancer Brother   . Heart attack Daughter   . Diabetes Daughter   . Depression Daughter      Current Outpatient Medications:  .  HYDROcodone-acetaminophen (NORCO/VICODIN) 5-325 MG tablet, Take 1 tablet by mouth every 6 (six) hours as needed for moderate pain or severe pain., Disp: 30 tablet, Rfl: 0 .  lidocaine-prilocaine (EMLA)  cream, Apply to affected area once (Patient taking differently: Apply 1 application topically See admin instructions. Apply to affected area once as needed), Disp: 30 g, Rfl: 3 .  methylphenidate (RITALIN) 5 MG tablet, Take 1 tablet (5 mg total) by mouth daily with breakfast., Disp: 30 tablet, Rfl: 0 .  mirtazapine (REMERON) 15 MG tablet, Take 1 tablet (15 mg total) by mouth at bedtime., Disp: 30 tablet, Rfl: 2 .  ondansetron (ZOFRAN) 8 MG tablet, Take 1 tablet (8 mg total) by mouth 2 (two) times daily as needed (Nausea or vomiting)., Disp: 45 tablet, Rfl: 2 .  lactulose (CHRONULAC) 10 GM/15ML solution, Take 15 mLs (10 g total) by mouth daily. (Patient not taking: No sig reported), Disp: 236 mL, Rfl: 0 .  prochlorperazine (COMPAZINE) 10 MG tablet, Take 1 tablet (10 mg total) by mouth every 6 (six) hours as needed (Nausea or vomiting). (Patient not taking: No sig reported), Disp: 30 tablet, Rfl: 1  Physical exam:  Vitals:   06/28/20 1324  BP: 107/87  Pulse: (!) 105  Resp: 17  Temp: 98.4 F (36.9 C)  TempSrc: Tympanic  SpO2: 100%  Weight: 128 lb (58.1 kg)   Physical Exam  Constitutional:      General: He is not in acute distress.    Comments: Appears fatigued  Cardiovascular:     Rate and Rhythm: Normal rate and regular rhythm.     Heart sounds: Normal heart sounds.  Pulmonary:     Effort: Pulmonary effort is normal.     Breath sounds: Normal breath sounds.  Abdominal:     General: Bowel sounds are normal.     Palpations: Abdomen is soft.  Skin:    General: Skin is warm and dry.  Neurological:     Mental Status: He is alert and oriented to person, place, and time.      CMP Latest Ref Rng & Units 06/28/2020  Glucose 70 - 99 mg/dL 111(H)  BUN 8 - 23 mg/dL 15  Creatinine 0.61 - 1.24 mg/dL 0.91  Sodium 135 - 145 mmol/L 135  Potassium 3.5 - 5.1 mmol/L 4.1  Chloride 98 - 111 mmol/L 98  CO2 22 - 32 mmol/L 26  Calcium 8.9 - 10.3 mg/dL 9.1  Total Protein 6.5 - 8.1 g/dL 7.3  Total  Bilirubin 0.3 - 1.2 mg/dL 0.8  Alkaline Phos 38 - 126 U/L 309(H)  AST 15 - 41 U/L 61(H)  ALT 0 - 44 U/L 40   CBC Latest Ref Rng & Units 06/28/2020  WBC 4.0 - 10.5 K/uL 7.1  Hemoglobin 13.0 - 17.0 g/dL 11.9(L)  Hematocrit 39.0 - 52.0 % 35.4(L)  Platelets 150 - 400 K/uL 269     Assessment and plan- Patient is a 65 y.o. male  withstage IIIb multifocal locally advanced hepatocellular carcinoma cT4 N0 M0. Secondary to hereditary hemochromatosis here for routine follow-up  Head injury hemochromatosis: We will initially doing phlebotomies twice a weekWhich seemed to that the patient to some extent.  His LFTs are improving.  His bilirubin is now normal.  Alkaline phosphatase is trending down.  ALT is normal AST is mildly elevated.  However with twice weekly phlebotomies he is getting anemic and today his hemoglobin is 11.9.  I have explained to the patient as well as a significant other that we would not be able to continue doing phlebotomies twice a week at this rate.  I would recommend doing it once a week as long as the hemoglobin is more than 11.  I will check his CBC ferritin and B12 next week for a possible phlebotomy.  Hepatocellular carcinoma.  Patient has received 3 cycles so far with last treatment given on 06/22/2020.  He has upcoming staging scans.  Alpha-fetoprotein went up from 37822 to 68274 up until last month and is now down to 39,666.  I will see him on 07/13/2020 with CBC with differential CMP and AFP for cycle 4 of Tecentriq and Avastin. Visit Diagnosis 1. Cancer, hepatocellular ([place])   2. Hereditary hemochromatosis ([place])      Dr. Randa Evens, MD, MPH [facility] at [facility] [phone] 06/30/2020 8:22 AM

## 2020-07-01 ENCOUNTER — Other Ambulatory Visit: Payer: Self-pay | Admitting: Oncology

## 2020-07-02 ENCOUNTER — Inpatient Hospital Stay (HOSPITAL_BASED_OUTPATIENT_CLINIC_OR_DEPARTMENT_OTHER): Payer: 59 | Admitting: Hospice and Palliative Medicine

## 2020-07-02 DIAGNOSIS — C220 Liver cell carcinoma: Secondary | ICD-10-CM

## 2020-07-02 DIAGNOSIS — Z515 Encounter for palliative care: Secondary | ICD-10-CM

## 2020-07-02 NOTE — Progress Notes (Signed)
Virtual Visit via Telephone Note  I connected with Maxwell Edwards on 07/02/20 at 11:30 AM EDT by telephone and verified that I am speaking with the correct person using two identifiers.  Location: Patient: home Provider: clinic   I discussed the limitations, risks, security and privacy concerns of performing an evaluation and management service by telephone and the availability of in person appointments. I also discussed with the patient that there may be a patient responsible charge related to this service. The patient expressed understanding and agreed to proceed.   History of Present Illness: Maxwell Edwards is a 65 y.o. male with multiple medical problems including CAD status post CABG, hypertension, hyperlipidemia, hemochromatosis and multifocal locally advanced hepatocellular carcinoma on systemic treatment with Tecentriq and Avastin.  Patient has been symptomatic with nausea, weight loss, and insomnia.  Palliative care was consulted up address goals and manage ongoing symptoms.   Observations/Objective: I called and spoke with patient and ex-wife by phone.  Patient was much more engaging on the phone today than normal.  He says that he is feeling significantly improved over the past couple of weeks.  Oral intake is still reportedly poor but improving.  He is drinking at least 2 ensures a day.  He was able to get on the riding lawnmower and mow an acre of grass over the weekend.  Overall, patient and family are optimistic regarding his improvement.  Assessment and Plan: [place] -on systemic treatment.  Symptomatically appears to be improving. Will follow  Follow Up Instructions: Follow-up MyChart visit 1 month   I discussed the assessment and treatment plan with the patient. The patient was provided an opportunity to ask questions and all were answered. The patient agreed with the plan and demonstrated an understanding of the instructions.   The patient was advised to call back or seek an  in-person evaluation if the symptoms worsen or if the condition fails to improve as anticipated.  I provided 10 minutes of non-face-to-face time during this encounter.   Irean Hong, NP

## 2020-07-03 ENCOUNTER — Ambulatory Visit
Admission: RE | Admit: 2020-07-03 | Discharge: 2020-07-03 | Disposition: A | Discharge status: Home / Self Care | Payer: 59 | Source: Ambulatory Visit | Attending: Oncology | Admitting: Oncology

## 2020-07-03 ENCOUNTER — Other Ambulatory Visit: Payer: Self-pay

## 2020-07-03 ENCOUNTER — Other Ambulatory Visit: Payer: Self-pay | Admitting: Oncology

## 2020-07-03 ENCOUNTER — Other Ambulatory Visit: Payer: Self-pay | Admitting: *Deleted

## 2020-07-03 ENCOUNTER — Inpatient Hospital Stay: Payer: 59

## 2020-07-03 DIAGNOSIS — C220 Liver cell carcinoma: Secondary | ICD-10-CM

## 2020-07-03 DIAGNOSIS — E860 Dehydration: Secondary | ICD-10-CM

## 2020-07-03 LAB — COMPREHENSIVE METABOLIC PANEL
ALT: 52 U/L — ABNORMAL HIGH (ref 0–44)
AST: 86 U/L — ABNORMAL HIGH (ref 15–41)
Albumin: 2.8 g/dL — ABNORMAL LOW (ref 3.5–5.0)
Alkaline Phosphatase: 340 U/L — ABNORMAL HIGH (ref 38–126)
Anion gap: 11 (ref 5–15)
BUN: 12 mg/dL (ref 8–23)
CO2: 25 mmol/L (ref 22–32)
Calcium: 8.7 mg/dL — ABNORMAL LOW (ref 8.9–10.3)
Chloride: 100 mmol/L (ref 98–111)
Creatinine, Ser: 0.83 mg/dL (ref 0.61–1.24)
GFR, Estimated: >60 mL/min (ref >60)
Glucose, Bld: 107 mg/dL — ABNORMAL HIGH (ref 70–99)
Potassium: 3.8 mmol/L (ref 3.5–5.1)
Sodium: 136 mmol/L (ref 135–145)
Total Bilirubin: 0.6 mg/dL (ref 0.3–1.2)
Total Protein: 6.7 g/dL (ref 6.5–8.1)

## 2020-07-03 LAB — CBC WITH DIFFERENTIAL/PLATELET
Abs Immature Granulocytes: 0.02 10*3/uL (ref 0.00–0.07)
Basophils Absolute: 0.1 10*3/uL (ref 0.0–0.1)
Basophils Relative: 1 %
Eosinophils Absolute: 0.1 10*3/uL (ref 0.0–0.5)
Eosinophils Relative: 1 %
HCT: 30 % — ABNORMAL LOW (ref 39.0–52.0)
Hemoglobin: 9.9 g/dL — ABNORMAL LOW (ref 13.0–17.0)
Immature Granulocytes: 0 %
Lymphocytes Relative: 29 %
Lymphs Abs: 1.3 10*3/uL (ref 0.7–4.0)
MCH: 33.9 pg (ref 26.0–34.0)
MCHC: 33 g/dL (ref 30.0–36.0)
MCV: 102.7 fL — ABNORMAL HIGH (ref 80.0–100.0)
Monocytes Absolute: 0.5 10*3/uL (ref 0.1–1.0)
Monocytes Relative: 10 %
Neutro Abs: 2.7 10*3/uL (ref 1.7–7.7)
Neutrophils Relative %: 59 %
Platelets: 171 10*3/uL (ref 150–400)
RBC: 2.92 MIL/uL — ABNORMAL LOW (ref 4.22–5.81)
RDW: 14.7 % (ref 11.5–15.5)
WBC: 4.6 10*3/uL (ref 4.0–10.5)
nRBC: 0 % (ref 0.0–0.2)

## 2020-07-03 LAB — FOLATE: Folate: 3.9 ng/mL — ABNORMAL LOW (ref >5.9)

## 2020-07-03 LAB — VITAMIN B12: Vitamin B-12: 787 pg/mL (ref 180–914)

## 2020-07-03 LAB — FERRITIN: Ferritin: 3568 ng/mL — ABNORMAL HIGH (ref 24–336)

## 2020-07-03 MED ORDER — IOHEXOL 300 MG/ML  SOLN
75.0000 mL | Freq: Once | INTRAMUSCULAR | Status: AC | PRN
  Administered 2020-07-03: 75 mL via INTRAVENOUS

## 2020-07-03 MED ORDER — FOLIC ACID 1 MG PO TABS: 2.0000 mg | ORAL_TABLET | Freq: Every day | ORAL | 0 refills | Status: DC

## 2020-07-03 MED ORDER — HEPARIN SOD (PORK) LOCK FLUSH 100 UNIT/ML IV SOLN
500.0000 [IU] | Freq: Once | INTRAVENOUS | Status: AC
Start: 2020-07-03 — End: 2020-07-03
  Administered 2020-07-03: 500 [IU] via INTRAVENOUS
  Filled 2020-07-03: qty 5

## 2020-07-03 MED ORDER — HEPARIN SOD (PORK) LOCK FLUSH 100 UNIT/ML IV SOLN
INTRAVENOUS | Status: AC
  Filled 2020-07-03: qty 5

## 2020-07-03 MED ORDER — SODIUM CHLORIDE 0.9% FLUSH
10.0000 mL | Freq: Once | INTRAVENOUS | Status: AC
  Administered 2020-07-03: 10 mL via INTRAVENOUS
  Filled 2020-07-03: qty 10

## 2020-07-03 MED ORDER — SODIUM CHLORIDE 0.9 % IV SOLN
Freq: Once | INTRAVENOUS | Status: AC
  Filled 2020-07-03: qty 250

## 2020-07-03 NOTE — Patient Instructions (Signed)
[facility] [place] [facility]  Discharge Instructions: Thank you for choosing [facility] to provide your oncology and hematology care.  If you have a lab appointment with the Cancer Center, please go directly to the Cancer Center and check in at the registration area.  Wear comfortable clothing and clothing appropriate for easy access to any Portacath or PICC line.   We strive to give you quality time with your provider. You may need to reschedule your appointment if you arrive late (15 or more minutes).  Arriving late affects you and other patients whose appointments are after yours.  Also, if you miss three or more appointments without notifying the office, you may be dismissed from the clinic at the provider's discretion.      For prescription refill requests, have your pharmacy contact our office and allow 72 hours for refills to be completed.      To help prevent nausea and vomiting after your treatment, we encourage you to take your nausea medication as directed.  BELOW ARE SYMPTOMS THAT SHOULD BE REPORTED IMMEDIATELY: *FEVER GREATER THAN 100.4 F (38 C) OR HIGHER *CHILLS OR SWEATING *NAUSEA AND VOMITING THAT IS NOT CONTROLLED WITH YOUR NAUSEA MEDICATION *UNUSUAL SHORTNESS OF BREATH *UNUSUAL BRUISING OR BLEEDING *URINARY PROBLEMS (pain or burning when urinating, or frequent urination) *BOWEL PROBLEMS (unusual diarrhea, constipation, pain near the anus) TENDERNESS IN MOUTH AND THROAT WITH OR WITHOUT PRESENCE OF ULCERS (sore throat, sores in mouth, or a toothache) UNUSUAL RASH, SWELLING OR PAIN  UNUSUAL VAGINAL DISCHARGE OR ITCHING   Items with * indicate a potential emergency and should be followed up as soon as possible or go to the Emergency Department if any problems should occur.  Please show the CHEMOTHERAPY ALERT CARD or IMMUNOTHERAPY ALERT CARD at check-in to the Emergency Department and triage nurse.  Should you have questions after your  visit or need to cancel or reschedule your appointment, please contact [facility] [place] [facility]  [phone] and follow the prompts.  Office hours are 8:00 a.m. to 4:30 p.m. Monday - Friday. Please note that voicemails left after 4:00 p.m. may not be returned until the following business day.  We are closed weekends and major holidays. You have access to a nurse at all times for urgent questions. Please call the main number to the clinic [phone] and follow the prompts.  For any non-urgent questions, you may also contact your provider using MyChart. We now offer e-Visits for anyone 18 and older to request care online for non-urgent symptoms. For details visit [email or web address].   Also download the MyChart app! Go to the app store, search "MyChart", open the app, select [place], and log in with your MyChart username and password.  Due to Covid, a mask is required upon entering the hospital/clinic. If you do not have a mask, one will be given to you upon arrival. For doctor visits, patients may have 1 support person aged 18 or older with them. For treatment visits, patients cannot have anyone with them due to current Covid guidelines and our immunocompromised population.  

## 2020-07-03 NOTE — Progress Notes (Signed)
Hbg 9.9 today. Per Dr Janese Banks no phlebotomy if Hbg is <11. 1 L NS given over one hour. BP 99/63 at discharge

## 2020-07-04 ENCOUNTER — Telehealth: Payer: Self-pay | Admitting: Oncology

## 2020-07-04 ENCOUNTER — Ambulatory Visit: Payer: 59

## 2020-07-04 ENCOUNTER — Other Ambulatory Visit: Payer: Self-pay | Admitting: Oncology

## 2020-07-04 ENCOUNTER — Encounter: Payer: Self-pay | Admitting: Oncology

## 2020-07-04 NOTE — Telephone Encounter (Signed)
Spoke with patient's wife about accompanying patient to his appointment with MD on Friday. She was agreeable to this.

## 2020-07-06 ENCOUNTER — Inpatient Hospital Stay: Payer: 59 | Admitting: Oncology

## 2020-07-06 ENCOUNTER — Other Ambulatory Visit: Payer: Self-pay | Admitting: Hospice and Palliative Medicine

## 2020-07-06 ENCOUNTER — Inpatient Hospital Stay: Payer: 59

## 2020-07-06 ENCOUNTER — Encounter: Payer: Self-pay | Admitting: Oncology

## 2020-07-06 MED ORDER — HYDROCODONE-ACETAMINOPHEN 5-325 MG PO TABS: 1.0000 | ORAL_TABLET | Freq: Four times a day (QID) | ORAL | 0 refills | Status: DC | PRN

## 2020-07-08 ENCOUNTER — Other Ambulatory Visit: Payer: Self-pay | Admitting: *Deleted

## 2020-07-08 DIAGNOSIS — C220 Liver cell carcinoma: Secondary | ICD-10-CM

## 2020-07-10 ENCOUNTER — Inpatient Hospital Stay: Payer: 59 | Attending: Oncology

## 2020-07-10 VITALS — BP 117/71 | HR 84 | Temp 97.6°F

## 2020-07-10 DIAGNOSIS — C220 Liver cell carcinoma: Secondary | ICD-10-CM | POA: Insufficient documentation

## 2020-07-10 DIAGNOSIS — Z5112 Encounter for antineoplastic immunotherapy: Secondary | ICD-10-CM | POA: Diagnosis present

## 2020-07-10 DIAGNOSIS — R5381 Other malaise: Secondary | ICD-10-CM | POA: Diagnosis not present

## 2020-07-10 DIAGNOSIS — R634 Abnormal weight loss: Secondary | ICD-10-CM | POA: Insufficient documentation

## 2020-07-10 DIAGNOSIS — R5383 Other fatigue: Secondary | ICD-10-CM | POA: Diagnosis not present

## 2020-07-10 DIAGNOSIS — Z79899 Other long term (current) drug therapy: Secondary | ICD-10-CM | POA: Diagnosis not present

## 2020-07-10 DIAGNOSIS — E860 Dehydration: Secondary | ICD-10-CM

## 2020-07-10 MED ORDER — SODIUM CHLORIDE 0.9 % IV SOLN
Freq: Once | INTRAVENOUS | Status: AC
  Filled 2020-07-10: qty 250

## 2020-07-10 MED ORDER — HEPARIN SOD (PORK) LOCK FLUSH 100 UNIT/ML IV SOLN
500.0000 [IU] | Freq: Once | INTRAVENOUS | Status: AC
Start: 2020-07-10 — End: 2020-07-10
  Administered 2020-07-10: 500 [IU] via INTRAVENOUS
  Filled 2020-07-10: qty 5

## 2020-07-10 MED ORDER — SODIUM CHLORIDE 0.9% FLUSH
10.0000 mL | INTRAVENOUS | Status: DC | PRN
  Administered 2020-07-10: 10 mL via INTRAVENOUS
  Filled 2020-07-10: qty 10

## 2020-07-10 NOTE — Progress Notes (Signed)
Pt eating and drinking. Walking around like a normal person. He feels like a normal person. Went to [employer] and walked around. Some mild pedal edema present. Instruct to elevate feet when sitting and to drink extra water to help reduce the edema. Received 1 liter of NS over 1 hour. States he is going to go home and mow his 3 acres of land. Discharged to home. Discussed canceling Thurs appt for IVFs as pt will receive IVFs on Friday. Will cancel that appointment.

## 2020-07-11 ENCOUNTER — Encounter: Payer: Self-pay | Admitting: *Deleted

## 2020-07-12 ENCOUNTER — Inpatient Hospital Stay: Payer: 59

## 2020-07-13 ENCOUNTER — Inpatient Hospital Stay (HOSPITAL_BASED_OUTPATIENT_CLINIC_OR_DEPARTMENT_OTHER): Payer: 59 | Admitting: Oncology

## 2020-07-13 ENCOUNTER — Encounter: Payer: Self-pay | Admitting: *Deleted

## 2020-07-13 ENCOUNTER — Encounter: Payer: Self-pay | Admitting: Oncology

## 2020-07-13 ENCOUNTER — Inpatient Hospital Stay: Payer: 59

## 2020-07-13 ENCOUNTER — Other Ambulatory Visit: Payer: Self-pay | Admitting: *Deleted

## 2020-07-13 VITALS — BP 125/76 | HR 78

## 2020-07-13 VITALS — BP 107/72 | HR 88 | Temp 99.1°F | Resp 18 | Wt 138.0 lb

## 2020-07-13 DIAGNOSIS — C220 Liver cell carcinoma: Secondary | ICD-10-CM

## 2020-07-13 DIAGNOSIS — Z5112 Encounter for antineoplastic immunotherapy: Secondary | ICD-10-CM

## 2020-07-13 DIAGNOSIS — R808 Other proteinuria: Secondary | ICD-10-CM | POA: Diagnosis not present

## 2020-07-13 DIAGNOSIS — E538 Deficiency of other specified B group vitamins: Secondary | ICD-10-CM

## 2020-07-13 DIAGNOSIS — R809 Proteinuria, unspecified: Secondary | ICD-10-CM

## 2020-07-13 DIAGNOSIS — F17210 Nicotine dependence, cigarettes, uncomplicated: Secondary | ICD-10-CM

## 2020-07-13 DIAGNOSIS — M7989 Other specified soft tissue disorders: Secondary | ICD-10-CM

## 2020-07-13 LAB — CBC WITH DIFFERENTIAL/PLATELET
Abs Immature Granulocytes: 0 10*3/uL (ref 0.00–0.07)
Basophils Absolute: 0 10*3/uL (ref 0.0–0.1)
Basophils Relative: 1 %
Eosinophils Absolute: 0.1 10*3/uL (ref 0.0–0.5)
Eosinophils Relative: 1 %
HCT: 35.6 % — ABNORMAL LOW (ref 39.0–52.0)
Hemoglobin: 11.8 g/dL — ABNORMAL LOW (ref 13.0–17.0)
Immature Granulocytes: 0 %
Lymphocytes Relative: 24 %
Lymphs Abs: 1.2 10*3/uL (ref 0.7–4.0)
MCH: 34.1 pg — ABNORMAL HIGH (ref 26.0–34.0)
MCHC: 33.1 g/dL (ref 30.0–36.0)
MCV: 102.9 fL — ABNORMAL HIGH (ref 80.0–100.0)
Monocytes Absolute: 0.4 10*3/uL (ref 0.1–1.0)
Monocytes Relative: 9 %
Neutro Abs: 3.3 10*3/uL (ref 1.7–7.7)
Neutrophils Relative %: 65 %
Platelets: 178 10*3/uL (ref 150–400)
RBC: 3.46 MIL/uL — ABNORMAL LOW (ref 4.22–5.81)
RDW: 14.6 % (ref 11.5–15.5)
WBC: 5 10*3/uL (ref 4.0–10.5)
nRBC: 0 % (ref 0.0–0.2)

## 2020-07-13 LAB — COMPREHENSIVE METABOLIC PANEL
ALT: 70 U/L — ABNORMAL HIGH (ref 0–44)
AST: 81 U/L — ABNORMAL HIGH (ref 15–41)
Albumin: 2.8 g/dL — ABNORMAL LOW (ref 3.5–5.0)
Alkaline Phosphatase: 426 U/L — ABNORMAL HIGH (ref 38–126)
Anion gap: 9 (ref 5–15)
BUN: 15 mg/dL (ref 8–23)
CO2: 24 mmol/L (ref 22–32)
Calcium: 8.7 mg/dL — ABNORMAL LOW (ref 8.9–10.3)
Chloride: 100 mmol/L (ref 98–111)
Creatinine, Ser: 0.64 mg/dL (ref 0.61–1.24)
GFR, Estimated: >60 mL/min (ref >60)
Glucose, Bld: 118 mg/dL — ABNORMAL HIGH (ref 70–99)
Potassium: 3.9 mmol/L (ref 3.5–5.1)
Sodium: 133 mmol/L — ABNORMAL LOW (ref 135–145)
Total Bilirubin: 0.6 mg/dL (ref 0.3–1.2)
Total Protein: 6.8 g/dL (ref 6.5–8.1)

## 2020-07-13 LAB — URINALYSIS, DIPSTICK ONLY
Bilirubin Urine: NEGATIVE
Glucose, UA: NEGATIVE mg/dL
Ketones, ur: NEGATIVE mg/dL
Leukocytes,Ua: NEGATIVE
Nitrite: NEGATIVE
Protein, ur: 100 mg/dL — AB
Specific Gravity, Urine: 1.019 (ref 1.005–1.030)
pH: 5 (ref 5.0–8.0)

## 2020-07-13 LAB — TSH: TSH: 2.436 u[IU]/mL (ref 0.350–4.500)

## 2020-07-13 MED ORDER — HEPARIN SOD (PORK) LOCK FLUSH 100 UNIT/ML IV SOLN
500.0000 [IU] | Freq: Once | INTRAVENOUS | Status: AC | PRN
  Administered 2020-07-13: 500 [IU]
  Filled 2020-07-13: qty 5

## 2020-07-13 MED ORDER — SODIUM CHLORIDE 0.9 % IV SOLN
Freq: Once | INTRAVENOUS | Status: AC
  Filled 2020-07-13: qty 250

## 2020-07-13 MED ORDER — SODIUM CHLORIDE 0.9 % IV SOLN
1200.0000 mg | Freq: Once | INTRAVENOUS | Status: AC
  Administered 2020-07-13: 1200 mg via INTRAVENOUS
  Filled 2020-07-13: qty 20

## 2020-07-13 MED ORDER — SODIUM CHLORIDE 0.9 % IV SOLN
15.0000 mg/kg | Freq: Once | INTRAVENOUS | Status: AC
  Administered 2020-07-13: 1100 mg via INTRAVENOUS
  Filled 2020-07-13: qty 32

## 2020-07-13 MED ORDER — SODIUM CHLORIDE 0.9% FLUSH
10.0000 mL | INTRAVENOUS | Status: DC | PRN
  Administered 2020-07-13: 10 mL
  Filled 2020-07-13: qty 10

## 2020-07-13 NOTE — Patient Instructions (Signed)
[facility] [place] [facility]  Discharge Instructions: Thank you for choosing [facility] to provide your oncology and hematology care.  If you have a lab appointment with the Cancer Center, please go directly to the Cancer Center and check in at the registration area.  Wear comfortable clothing and clothing appropriate for easy access to any Portacath or PICC line.   We strive to give you quality time with your provider. You may need to reschedule your appointment if you arrive late (15 or more minutes).  Arriving late affects you and other patients whose appointments are after yours.  Also, if you miss three or more appointments without notifying the office, you may be dismissed from the clinic at the provider's discretion.      For prescription refill requests, have your pharmacy contact our office and allow 72 hours for refills to be completed.      To help prevent nausea and vomiting after your treatment, we encourage you to take your nausea medication as directed.  BELOW ARE SYMPTOMS THAT SHOULD BE REPORTED IMMEDIATELY: *FEVER GREATER THAN 100.4 F (38 C) OR HIGHER *CHILLS OR SWEATING *NAUSEA AND VOMITING THAT IS NOT CONTROLLED WITH YOUR NAUSEA MEDICATION *UNUSUAL SHORTNESS OF BREATH *UNUSUAL BRUISING OR BLEEDING *URINARY PROBLEMS (pain or burning when urinating, or frequent urination) *BOWEL PROBLEMS (unusual diarrhea, constipation, pain near the anus) TENDERNESS IN MOUTH AND THROAT WITH OR WITHOUT PRESENCE OF ULCERS (sore throat, sores in mouth, or a toothache) UNUSUAL RASH, SWELLING OR PAIN  UNUSUAL VAGINAL DISCHARGE OR ITCHING   Items with * indicate a potential emergency and should be followed up as soon as possible or go to the Emergency Department if any problems should occur.  Please show the CHEMOTHERAPY ALERT CARD or IMMUNOTHERAPY ALERT CARD at check-in to the Emergency Department and triage nurse.  Should you have questions after your  visit or need to cancel or reschedule your appointment, please contact [facility] [place] [facility]  [phone] and follow the prompts.  Office hours are 8:00 a.m. to 4:30 p.m. Monday - Friday. Please note that voicemails left after 4:00 p.m. may not be returned until the following business day.  We are closed weekends and major holidays. You have access to a nurse at all times for urgent questions. Please call the main number to the clinic [phone] and follow the prompts.  For any non-urgent questions, you may also contact your provider using MyChart. We now offer e-Visits for anyone 18 and older to request care online for non-urgent symptoms. For details visit [email or web address].   Also download the MyChart app! Go to the app store, search "MyChart", open the app, select [place], and log in with your MyChart username and password.  Due to Covid, a mask is required upon entering the hospital/clinic. If you do not have a mask, one will be given to you upon arrival. For doctor visits, patients may have 1 support person aged 18 or older with them. For treatment visits, patients cannot have anyone with them due to current Covid guidelines and our immunocompromised population.  

## 2020-07-13 NOTE — Progress Notes (Signed)
Hematology/Oncology Consult note [facility]  Telephone:([phone] [phone]  Patient Care Team: Venita Lick, NP as PCP - General (Nurse Practitioner) Clent Jacks, RN as Oncology Nurse Navigator Sindy Guadeloupe, MD as Consulting Physician (Hematology and Oncology)   Name of the patient: Maxwell Edwards  [id number]  1955-09-29   Date of visit: 07/13/20  Diagnosis- multifocal locally advanced hepatocellular carcinoma Hereditary hemochromatosis  Chief complaint/ Reason for visit-discuss CT scan results and on treatment assessment prior to cycle 4 of a Avastin Tecentriq  Heme/Onc history: patient is a 65 year old male with a past medical history significant for hypertension hyperlipidemia, 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 Reports that he has lost about 20 pounds over the last 2 to 3 months. He is a chronic smoker which prompted a CT chest. CT chest without contrast on 04/06/2020 showed no evidence of mediastinal or hilar adenopathy. He was found to have 4 subcentimeter lung nodules 3 in the right lobe and one in the left upper lobe which were borderline for metastatic disease. Cirrhotic liver morphology with 8.3 x 7 cm ill-defined hypoattenuating lesion in the left liver with a 3.3 cm lesion in the dome of the liver near the junction of the right and left hepatic lobes. Labs showed elevated AST and ALT of 62 and 47 respectively with a normal albumin and normal total bilirubin. CMP showed elevated H&H of 18.3/53.8. 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 Another mass in the segment 4A measuring 3.4 x 2.9 cm.No evidence of local  regional adenopathy.  Alpha-fetoprotein elevated at 38,000. CA 19-9 elevated at 147.Patient's case discussed at tumor board and consensus was that this would be consistent with [facility] especially given the AFP value. Initially MRI was not characteristic for [facility] but a biopsy was not deemed necessary after AFP results were back    Interval history-patient is here with his ex-wife today with his daughter hearing the conversation over the phone.  Patient has usually does not speak much but family reports that he is doing remarkably better.  His appetite has come back and he is able to eat regular food without any significant problems.  He has also been up and about mowing his yard and being very more active than ever before.  Patients family concerned about his leg swelling  ECOG PS- 1 Pain scale- 0 Opioid associated constipation- no  Review of systems- Review of Systems  Constitutional: Positive for malaise/fatigue. Negative for chills, fever and weight loss.  HENT: Negative for congestion, ear discharge and nosebleeds.   Eyes: Negative for blurred vision.  Respiratory: Negative for cough, hemoptysis, sputum production, shortness of breath and wheezing.   Cardiovascular: Positive for leg swelling. Negative for chest pain, palpitations, orthopnea and claudication.  Gastrointestinal: Negative for abdominal pain, blood in stool, constipation, diarrhea, heartburn, melena, nausea and vomiting.  Genitourinary: Negative for dysuria, flank pain, frequency, hematuria and urgency.  Musculoskeletal: Negative for back pain, joint pain and myalgias.  Skin: Negative for rash.  Neurological: Negative for dizziness, tingling, focal weakness, seizures, weakness and headaches.  Endo/Heme/Allergies: Does not bruise/bleed easily.  Psychiatric/Behavioral: Negative for depression and suicidal ideas. The patient does not have  insomnia.        No Known Allergies   Past Medical History:  Diagnosis Date  .  Barrett esophagus   . CAD (coronary artery disease)   . Cancer [facility])    liver cancer  . Cirrhosis ([place])   . Heart disease      Past Surgical History:  Procedure Laterality Date  . CORONARY ARTERY BYPASS GRAFT     at 65 years old  . ESOPHAGOGASTRODUODENOSCOPY (EGD) WITH PROPOFOL N/A 05/21/2020   Procedure: ESOPHAGOGASTRODUODENOSCOPY (EGD) WITH PROPOFOL;  Surgeon: Lin Landsman, MD;  Location: [place];  Service: Gastroenterology;  Laterality: N/A;  . LAPAROSCOPIC LYSIS OF ADHESIONS  06/12/2020   Procedure: LAPAROSCOPIC LYSIS OF ADHESIONS;  Surgeon: Jules Husbands, MD;  Location: [facility];  Service: General;;  . PORTA CATH INSERTION N/A 04/30/2020   Procedure: PORTA CATH INSERTION;  Surgeon: Algernon Huxley, MD;  Location: [place] [facility];  Service: Cardiovascular;  Laterality: N/A;  . VEIN BYPASS SURGERY      Social History   Socioeconomic History  . Marital status: Divorced    Spouse name: Not on file  . Number of children: 2  . Years of education: Not on file  . Highest education level: Not on file  Occupational History  . 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[employer]  . Smoking status: Current Some Day Smoker    Packs/day: 0.25    Types: Cigarettes  . Smokeless tobacco: Never Used  Vaping Use  . Vaping Use: Never used  Substance and Sexual Activity  . Alcohol use: Not Currently  . Drug use: Not Currently  . Sexual activity: Not Currently  Other Topics Concern  . Not on file  Social History Narrative   Lives by himself    Social Determinants of Health   Financial Resource Strain: Not on file  Food Insecurity: Not on file  Transportation Needs: Not on file  Physical Activity: Not on file  Stress: Not on file  Social Connections: Not on file  Intimate Partner Violence: Not on file    Family History  Problem Relation Age of Onset  . Dementia Mother   . Ulcers Father   . Heart attack Brother   . Throat cancer Brother   .  Heart attack Daughter   . Diabetes Daughter   . Depression Daughter      Current Outpatient Medications:  .  fluticasone (FLONASE) 50 MCG/ACT nasal spray, Place into both nostrils., Disp: , Rfl:  .  folic acid (FOLVITE) 1 MG tablet, TAKE 2 TABLETS BY MOUTH EVERY DAY, Disp: 60 tablet, Rfl: 0 .  lactulose (CHRONULAC) 10 GM/15ML solution, Take 15 mLs (10 g total) by mouth daily., Disp: 236 mL, Rfl: 0 .  lidocaine-prilocaine (EMLA) cream, Apply to affected area once (Patient taking differently: Apply 1 application topically See admin instructions. Apply to affected area once as needed), Disp: 30 g, Rfl: 3 .  ondansetron (ZOFRAN) 8 MG tablet, Take 1 tablet (8 mg total) by mouth 2 (two) times daily as needed (Nausea or vomiting)., Disp: 45 tablet, Rfl: 2 .  HYDROcodone-acetaminophen (NORCO/VICODIN) 5-325 MG tablet, Take 1 tablet by mouth every 6 (six) hours as needed for moderate pain or severe pain. (Patient not taking: Reported on 07/13/2020), Disp: 60 tablet, Rfl: 0 .  methylphenidate (RITALIN) 5 MG tablet, Take 1 tablet (5 mg total) by mouth daily with breakfast. (Patient not taking: Reported on 07/13/2020), Disp: 30 tablet, Rfl: 0 .  mirtazapine (REMERON) 15 MG tablet, Take 1 tablet (15 mg total) by mouth at bedtime. (Patient not taking: Reported on 07/13/2020), Disp: 30 tablet, Rfl: 2 .  omeprazole (PRILOSEC) 40 MG capsule, Take 1 capsule by mouth every morning. (Patient not taking: Reported on 07/13/2020), Disp: , Rfl:  .  prochlorperazine (COMPAZINE) 10 MG tablet, Take 1 tablet (10 mg total) by mouth every 6 (six) hours as needed (Nausea or vomiting). (Patient not taking: No sig reported), Disp: 30 tablet, Rfl: 1 No current facility-administered medications for this visit.  Facility-Administered Medications Ordered in Other Visits:  .  sodium chloride flush (NS) 0.9 % injection 10 mL, 10 mL, Intracatheter, PRN, Sindy Guadeloupe, MD, 10 mL at 07/13/20 0826  Physical exam:  Vitals:   07/13/20 0847   BP: 107/72  Pulse: 88  Resp: 18  Temp: 99.1 F (37.3 C)  SpO2: 100%  Weight: 138 lb (62.6 kg)   Physical Exam Constitutional:      General: He is not in acute distress. Cardiovascular:     Rate and Rhythm: Normal rate and regular rhythm.     Heart sounds: Normal heart sounds.  Pulmonary:     Effort: Pulmonary effort is normal.     Breath sounds: Normal breath sounds.  Abdominal:     General: Bowel sounds are normal.     Palpations: Abdomen is soft.  Skin:    General: Skin is warm and dry.  Neurological:     Mental Status: He is alert and oriented to person, place, and time.      CMP Latest Ref Rng & Units 07/13/2020  Glucose 70 - 99 mg/dL 118(H)  BUN 8 - 23 mg/dL 15  Creatinine 0.61 - 1.24 mg/dL 0.64  Sodium 135 - 145 mmol/L 133(L)  Potassium 3.5 - 5.1 mmol/L 3.9  Chloride 98 - 111 mmol/L 100  CO2 22 - 32 mmol/L 24  Calcium 8.9 - 10.3 mg/dL 8.7(L)  Total Protein 6.5 - 8.1 g/dL 6.8  Total Bilirubin 0.3 - 1.2 mg/dL 0.6  Alkaline Phos 38 - 126 U/L 426(H)  AST 15 - 41 U/L 81(H)  ALT 0 - 44 U/L 70(H)   CBC Latest Ref Rng & Units 07/13/2020  WBC 4.0 - 10.5 K/uL 5.0  Hemoglobin 13.0 - 17.0 g/dL 11.8(L)  Hematocrit 39.0 - 52.0 % 35.6(L)  Platelets 150 - 400 K/uL 178    No images are attached to the encounter.  CT CHEST ABDOMEN PELVIS W CONTRAST  Result Date: 07/04/2020 CLINICAL DATA:  65 year old male with history of hepatocellular carcinoma. Follow-up study. EXAM: CT CHEST, ABDOMEN, AND PELVIS WITH CONTRAST TECHNIQUE: Multidetector CT imaging of the chest, abdomen and pelvis was performed following the standard protocol during bolus administration of intravenous contrast. CONTRAST:  68mL OMNIPAQUE IOHEXOL 300 MG/ML  SOLN COMPARISON:  Chest CT 04/06/2020.  Abdominal MRI 04/30/2020. FINDINGS: CT CHEST FINDINGS Cardiovascular: Heart size is normal. There is no significant pericardial fluid, thickening or pericardial calcification. There is aortic atherosclerosis, as well as  atherosclerosis of the great vessels of the mediastinum and the coronary arteries, including calcified atherosclerotic plaque in the left main, left anterior descending, left circumflex and right coronary arteries. Status post median sternotomy for CABG including LIMA to the LAD. Right internal jugular single-lumen porta cath with tip terminating in the distal superior vena cava. Mediastinum/Nodes: No pathologically enlarged mediastinal or hilar lymph nodes. Esophagus is unremarkable in appearance. No axillary lymphadenopathy. Lungs/Pleura: 4 mm pulmonary nodule in the medial aspect of the right  upper lobe near the apex (axial image 23 of series 4), stable. 2 mm left upper lobe pulmonary nodule near the apex (axial image 19 of series 4), stable. New 3 mm left lower lobe pulmonary nodule (axial image 89 of series 4), nonspecific. No other definite suspicious appearing pulmonary nodules or masses are noted. No acute consolidative airspace disease. No pleural effusions. Diffuse bronchial wall thickening with very mild centrilobular and paraseptal emphysema. Musculoskeletal: There are no aggressive appearing lytic or blastic lesions noted in the visualized portions of the skeleton. CT ABDOMEN PELVIS FINDINGS Hepatobiliary: Liver has a shrunken appearance and very nodular contour, indicative of advanced cirrhosis. Again noted is a large infiltrative heterogeneously enhancing mass centered in the left lobe of the liver currently estimated to measure approximately 11.7 x 8.2 x 10.2 cm (axial image 54 of series 2 and coronal image 48 of series 5). Extending from this lesion there is a tumor thrombus in the left hepatic vein which extends to the inferior vena cava, but does not yet extend into the right atrium. There is a satellite lesion in segment 4A of the liver which measures 3.2 x 3.1 cm (previously 3.4 x 2.9 cm). No intra or extrahepatic biliary ductal dilatation. Gallbladder is nearly decompressed, but otherwise  unremarkable in appearance. Pancreas: No pancreatic mass. No pancreatic ductal dilatation. No pancreatic or peripancreatic fluid collections or inflammatory changes. Spleen: Spleen is enlarged measuring 15.4 x 5.3 x 10.6 cm (estimated splenic volume of 433 mL) . Adrenals/Urinary Tract: Bilateral kidneys and adrenal glands are normal in appearance. No hydroureteronephrosis. Urinary bladder is normal in appearance. Stomach/Bowel: The appearance of the stomach is normal. No pathologic dilatation of small bowel or colon. The appendix is not confidently identified and may be surgically absent. Regardless, there are no inflammatory changes noted adjacent to the cecum to suggest the presence of an acute appendicitis at this time. Vascular/Lymphatic: Aortic atherosclerosis, without evidence of aneurysm or dissection in the abdominal or pelvic vasculature. Portal vein is mildly dilated measuring 14 mm in the porta hepatis. Numerous small portosystemic collateral pathways are noted. No lymphadenopathy noted in the abdomen or pelvis. Reproductive: Prostate gland and seminal vesicles are unremarkable in appearance. Other: Small areas of soft tissue nodularity noted in the peritoneal cavity, largest of which is in the right pericolic gutter (axial image 96 of series 2) measuring 10 x 5 mm, concerning for potential metastatic implants. Small volume of ascites. No pneumoperitoneum. Musculoskeletal: There are no aggressive appearing lytic or blastic lesions noted in the visualized portions of the skeleton. IMPRESSION: 1. Progression of disease as evidence by enlargement of an infiltrative mass in the left lobe of the liver, enlarging satellite lesion in segment 4A of the liver, developing thrombus of the left portal vein which extends into the inferior vena cava, and findings concerning for potential intraperitoneal spread of metastatic disease including increasing ascites and small soft tissue nodules lining the peritoneal  surfaces, as detailed above. 2. Small pulmonary nodules are again noted in the lungs, one of which is new in the left lower lobe measuring only 3 mm. This is nonspecific, but attention on follow-up imaging is recommended to ensure stability or resolution. 3. Aortic atherosclerosis, in addition to left main and 3 vessel coronary artery disease. Status post median sternotomy for CABG including LIMA to the LAD. 4. Diffuse bronchial wall thickening with very mild centrilobular and paraseptal emphysema; imaging findings suggestive of underlying COPD. 5. Additional incidental findings, as above. Electronically Signed   By: Quillian Quince  Entrikin M.D.   On: 07/04/2020 09:51     Assessment and plan- Patient is a 65 y.o. male with locally advanced hepatocellular carcinoma here to discuss CT scan results and further management  Patient had his original CT scan which showed hepatocellular carcinoma back in February 2022.  He started Tecentriq and Avastin chemotherapy a month later.  Scans were done after 3 cycles which showed increase in the size of his primary liver mass from a prior 7.9 cm to 11.7 cm.  The secondary mass which was previously 3.4 cm is now 3.2 cm.  Patient's AFP levels were elevated at 38,000 at diagnosis and went up to 67,000 and then came down to 39,002 weeks ago.  His AFP levels were pending at the time of my visit with the patient but did come back at 18,000 which is a significant improvement as compared to before.  Symptomatically patient feels a whole lot better with improvement in his appetite and performance status.  I am therefore inclined to continue present treatment and consider getting another scan after 3 cycles of treatment  Patient has ongoing leg swelling and he was noted to have 100 mg of protein on his random urine sample.  I will proceed with Avastin today but obtain a 24-hour urine sample as well.  If there is more than 2 g of protein in his urine I will be holding a Avastin at his next  treatment.  Leg swelling could also be secondary to a Avastin.  Patient also has poor albumin levels which could be contributing as well.  Abnormal LFTs: Likely secondary to iron overload from hereditary hemochromatosis.    Normocytic anemia: We had to hold his phlebotomy due to anemia.  Today his hemoglobin is 11.8 and we will resume weekly phlebotomy at this time.  Continue phlebotomy as long as hemoglobin is greater than 11.Anemia work-up did reveal a low folic acid level and we are sending him a prescription for folic acid as well.  I will see him back in 3 weeks for cycle 5 of Tecentriq and possibly Avastin based on urine protein levels   Visit Diagnosis 1. Cancer, hepatocellular ([place])   2. Other proteinuria   3. Encounter for monoclonal antibody treatment for malignancy   4. Encounter for antineoplastic immunotherapy   5. Hereditary hemochromatosis ([place])   6. Folate deficiency      Dr. Randa Evens, MD, MPH [facility] [phone] 07/13/2020 2:29 PM

## 2020-07-13 NOTE — Progress Notes (Signed)
Nutrition Follow-up:  Patient with advanced hepatocellular cancer.  G-tube not placed due to massive hepatocellular carcinoma left lobe of liver and stomach unable to reach abdominal wall duet to liver cancer per surgery's note.  Patient continues with chemotherapy and phlebotomy as needed.   RD saw that patient was in infusion and met with him.  Patient reports that appetite is better.  "I am eating more."  States that he ate egg and toast and milk this am.  Drinking mostly milk.  Drinking 2 boost shakes a day mixed with milk.  Last night ate 1/2 of chicken breast (fried), 1/4 cup potato salad and ~ 1/2 cup baked beans.   Denies nausea. Reports bowels are moving.     Medications: reviewed  Labs: reviewed  Anthropometrics:   Weight 138 lb today  132 lb on 4/8 134 lb on 4/4 145 lb on 3/22   NUTRITION DIAGNOSIS:  Inadequate oral intake improving   INTERVENTION:  Reviewed ways to add calories and protein in diet. Encouraged patient to continue eating small frequent meals during the day.  Continue boost shakes as often as possible     MONITORING, EVALUATION, GOAL: weight trend, intake   NEXT VISIT: Friday, May 27 during infusion  Hodaya Curto B. Zenia Resides, [place], [place] Registered Dietitian [phone] (mobile)

## 2020-07-13 NOTE — Progress Notes (Signed)
UProtein 100. Will collect 24hr urine protein for determination of future dosing. MD aware and ok to treat. Keep current dose of 1100mg .

## 2020-07-14 LAB — AFP TUMOR MARKER: AFP, Serum, Tumor Marker: 18879 ng/mL — ABNORMAL HIGH (ref 0.0–8.4)

## 2020-07-15 DIAGNOSIS — Z5112 Encounter for antineoplastic immunotherapy: Secondary | ICD-10-CM | POA: Diagnosis not present

## 2020-07-16 ENCOUNTER — Other Ambulatory Visit: Payer: Self-pay

## 2020-07-16 DIAGNOSIS — C220 Liver cell carcinoma: Secondary | ICD-10-CM

## 2020-07-16 DIAGNOSIS — R809 Proteinuria, unspecified: Secondary | ICD-10-CM

## 2020-07-16 LAB — PROTEIN, URINE, 24 HOUR
Collection Interval-UPROT: 24 hours
Protein, 24H Urine: 675 mg/d — ABNORMAL HIGH (ref 50–100)
Protein, Urine: 50 mg/dL
Urine Total Volume-UPROT: 1350 mL

## 2020-07-17 ENCOUNTER — Encounter: Payer: Self-pay | Admitting: Oncology

## 2020-07-19 ENCOUNTER — Inpatient Hospital Stay: Payer: 59

## 2020-07-19 ENCOUNTER — Other Ambulatory Visit: Payer: Self-pay

## 2020-07-19 VITALS — BP 95/63 | HR 95 | Temp 97.6°F | Resp 16

## 2020-07-19 DIAGNOSIS — Z5112 Encounter for antineoplastic immunotherapy: Secondary | ICD-10-CM | POA: Diagnosis not present

## 2020-07-19 DIAGNOSIS — C220 Liver cell carcinoma: Secondary | ICD-10-CM

## 2020-07-19 LAB — COMPREHENSIVE METABOLIC PANEL
ALT: 88 U/L — ABNORMAL HIGH (ref 0–44)
AST: 93 U/L — ABNORMAL HIGH (ref 15–41)
Albumin: 2.9 g/dL — ABNORMAL LOW (ref 3.5–5.0)
Alkaline Phosphatase: 558 U/L — ABNORMAL HIGH (ref 38–126)
Anion gap: 11 (ref 5–15)
BUN: 16 mg/dL (ref 8–23)
CO2: 24 mmol/L (ref 22–32)
Calcium: 8.9 mg/dL (ref 8.9–10.3)
Chloride: 98 mmol/L (ref 98–111)
Creatinine, Ser: 0.66 mg/dL (ref 0.61–1.24)
GFR, Estimated: >60 mL/min (ref >60)
Glucose, Bld: 113 mg/dL — ABNORMAL HIGH (ref 70–99)
Potassium: 4.4 mmol/L (ref 3.5–5.1)
Sodium: 133 mmol/L — ABNORMAL LOW (ref 135–145)
Total Bilirubin: 0.9 mg/dL (ref 0.3–1.2)
Total Protein: 7.6 g/dL (ref 6.5–8.1)

## 2020-07-19 LAB — CBC WITH DIFFERENTIAL/PLATELET
Abs Immature Granulocytes: 0.03 10*3/uL (ref 0.00–0.07)
Basophils Absolute: 0.1 10*3/uL (ref 0.0–0.1)
Basophils Relative: 1 %
Eosinophils Absolute: 0.1 10*3/uL (ref 0.0–0.5)
Eosinophils Relative: 1 %
HCT: 40.7 % (ref 39.0–52.0)
Hemoglobin: 14 g/dL (ref 13.0–17.0)
Immature Granulocytes: 0 %
Lymphocytes Relative: 18 %
Lymphs Abs: 1.3 10*3/uL (ref 0.7–4.0)
MCH: 35.1 pg — ABNORMAL HIGH (ref 26.0–34.0)
MCHC: 34.4 g/dL (ref 30.0–36.0)
MCV: 102 fL — ABNORMAL HIGH (ref 80.0–100.0)
Monocytes Absolute: 0.8 10*3/uL (ref 0.1–1.0)
Monocytes Relative: 11 %
Neutro Abs: 4.9 10*3/uL (ref 1.7–7.7)
Neutrophils Relative %: 69 %
Platelets: 240 10*3/uL (ref 150–400)
RBC: 3.99 MIL/uL — ABNORMAL LOW (ref 4.22–5.81)
RDW: 14.1 % (ref 11.5–15.5)
WBC: 7.1 10*3/uL (ref 4.0–10.5)
nRBC: 0 % (ref 0.0–0.2)

## 2020-07-19 MED ORDER — SODIUM CHLORIDE 0.9% FLUSH
10.0000 mL | Freq: Once | INTRAVENOUS | Status: AC
  Administered 2020-07-19: 10 mL via INTRAVENOUS
  Filled 2020-07-19: qty 10

## 2020-07-19 MED ORDER — HEPARIN SOD (PORK) LOCK FLUSH 100 UNIT/ML IV SOLN
500.0000 [IU] | Freq: Once | INTRAVENOUS | Status: AC
Start: 2020-07-19 — End: 2020-07-19
  Administered 2020-07-19: 500 [IU] via INTRAVENOUS
  Filled 2020-07-19: qty 5

## 2020-07-19 MED ORDER — SODIUM CHLORIDE 0.9 % IV SOLN
Freq: Once | INTRAVENOUS | Status: AC
  Administered 2020-07-19: 500 mL via INTRAVENOUS
  Filled 2020-07-19: qty 250

## 2020-07-19 MED ORDER — HEPARIN SOD (PORK) LOCK FLUSH 100 UNIT/ML IV SOLN
INTRAVENOUS | Status: AC
  Filled 2020-07-19: qty 5

## 2020-07-19 NOTE — Progress Notes (Signed)
Hgb 14.0 - 523ml therapeutic phlebotomy performed per orders with 581ml NS replacement given. Well tolerated, VSS before and after.

## 2020-07-25 ENCOUNTER — Inpatient Hospital Stay: Payer: 59

## 2020-07-25 ENCOUNTER — Other Ambulatory Visit: Payer: Self-pay

## 2020-07-25 DIAGNOSIS — E860 Dehydration: Secondary | ICD-10-CM

## 2020-07-25 DIAGNOSIS — C220 Liver cell carcinoma: Secondary | ICD-10-CM

## 2020-07-25 DIAGNOSIS — Z5112 Encounter for antineoplastic immunotherapy: Secondary | ICD-10-CM | POA: Diagnosis not present

## 2020-07-25 LAB — HEMOGLOBIN AND HEMATOCRIT, BLOOD
HCT: 38.6 % — ABNORMAL LOW (ref 39.0–52.0)
Hemoglobin: 13 g/dL (ref 13.0–17.0)

## 2020-07-25 MED ORDER — SODIUM CHLORIDE 0.9 % IV SOLN
Freq: Once | INTRAVENOUS | Status: AC
Start: 2020-07-25 — End: 2020-07-25
  Filled 2020-07-25: qty 250

## 2020-07-25 MED ORDER — SODIUM CHLORIDE 0.9% FLUSH
10.0000 mL | INTRAVENOUS | Status: DC | PRN
  Administered 2020-07-25: 10 mL via INTRAVENOUS
  Filled 2020-07-25: qty 10

## 2020-07-25 MED ORDER — HEPARIN SOD (PORK) LOCK FLUSH 100 UNIT/ML IV SOLN
500.0000 [IU] | Freq: Once | INTRAVENOUS | Status: AC
  Administered 2020-07-25: 500 [IU] via INTRAVENOUS
  Filled 2020-07-25: qty 5

## 2020-07-25 NOTE — Progress Notes (Signed)
HCT 13 today. Proceeding with removing 500 ml blood for therapeutic phlebotomy via PIV. Replacing 1 liter of NS over 1 hour via port. Pt states he is doing well. Continuing to eat and drink. Bowels normal[place]. Able to mow his yard. Tolerated procedure well. Discharged to home with VSS.

## 2020-08-01 ENCOUNTER — Inpatient Hospital Stay (HOSPITAL_BASED_OUTPATIENT_CLINIC_OR_DEPARTMENT_OTHER): Payer: 59 | Admitting: Hospice and Palliative Medicine

## 2020-08-01 ENCOUNTER — Encounter: Payer: Self-pay | Admitting: Oncology

## 2020-08-01 DIAGNOSIS — C220 Liver cell carcinoma: Secondary | ICD-10-CM | POA: Diagnosis not present

## 2020-08-01 MED ORDER — MIRTAZAPINE 15 MG PO TABS: 15.0000 mg | ORAL_TABLET | Freq: Every day | ORAL | 2 refills | Status: DC

## 2020-08-01 MED ORDER — LACTULOSE 10 GM/15ML PO SOLN: 10.0000 g | Freq: Every day | ORAL | 1 refills | Status: DC

## 2020-08-01 NOTE — Progress Notes (Signed)
Virtual Visit via Telephone Note  I connected with Maxwell Edwards on 08/01/20 at 11:00 AM EDT by telephone and verified that I am speaking with the correct person using two identifiers.  Location: Patient: home Provider: clinic   I discussed the limitations, risks, security and privacy concerns of performing an evaluation and management service by telephone and the availability of in person appointments. I also discussed with the patient that there may be a patient responsible charge related to this service. The patient expressed understanding and agreed to proceed.   History of Present Illness: Maxwell Edwards is a 65 y.o. male with multiple medical problems including CAD status post CABG, hypertension, hyperlipidemia, hemochromatosis and multifocal locally advanced hepatocellular carcinoma on systemic treatment with Tecentriq and Avastin.  Patient has been previously symptomatic with nausea, weight loss, and insomnia.  Palliative care was consulted up address goals and manage ongoing symptoms.   Observations/Objective: I called and spoke with patient and ex-wife by phone.    Patient reports that he is doing well.  He denies any significant changes or concerns.  He says his appetite is good.  He has been gaining weight.  He reports significant energy and has been doing chores around the house.  He denies depression and reports stable moods.  No distressing symptoms at present.  Assessment and Plan: [place] -on systemic treatment.  Symptomatically appears to be improving. Will follow  Constipation - patient requested refill of lactulose. He is having a BM every other day or so.   Depression - he requested refill of mirtazapine. He is no longer taking methylphenidate.  Follow Up Instructions: Follow-up MyChart visit 1 month   I discussed the assessment and treatment plan with the patient. The patient was provided an opportunity to ask questions and all were answered. The patient agreed with  the plan and demonstrated an understanding of the instructions.   The patient was advised to call back or seek an in-person evaluation if the symptoms worsen or if the condition fails to improve as anticipated.  I provided 10 minutes of non-face-to-face time during this encounter.   Irean Hong, NP

## 2020-08-03 ENCOUNTER — Inpatient Hospital Stay: Payer: 59

## 2020-08-03 ENCOUNTER — Inpatient Hospital Stay (HOSPITAL_BASED_OUTPATIENT_CLINIC_OR_DEPARTMENT_OTHER): Payer: 59 | Admitting: Oncology

## 2020-08-03 ENCOUNTER — Encounter: Payer: Self-pay | Admitting: Oncology

## 2020-08-03 VITALS — BP 114/71 | HR 86 | Temp 98.6°F | Resp 18 | Wt 133.5 lb

## 2020-08-03 DIAGNOSIS — R945 Abnormal results of liver function studies: Secondary | ICD-10-CM | POA: Diagnosis not present

## 2020-08-03 DIAGNOSIS — Z5112 Encounter for antineoplastic immunotherapy: Secondary | ICD-10-CM | POA: Diagnosis not present

## 2020-08-03 DIAGNOSIS — C220 Liver cell carcinoma: Secondary | ICD-10-CM

## 2020-08-03 DIAGNOSIS — Z5111 Encounter for antineoplastic chemotherapy: Secondary | ICD-10-CM

## 2020-08-03 DIAGNOSIS — R7989 Other specified abnormal findings of blood chemistry: Secondary | ICD-10-CM

## 2020-08-03 LAB — COMPREHENSIVE METABOLIC PANEL
ALT: 85 U/L — ABNORMAL HIGH (ref 0–44)
AST: 108 U/L — ABNORMAL HIGH (ref 15–41)
Albumin: 2.7 g/dL — ABNORMAL LOW (ref 3.5–5.0)
Alkaline Phosphatase: 647 U/L — ABNORMAL HIGH (ref 38–126)
Anion gap: 10 (ref 5–15)
BUN: 18 mg/dL (ref 8–23)
CO2: 25 mmol/L (ref 22–32)
Calcium: 8.9 mg/dL (ref 8.9–10.3)
Chloride: 98 mmol/L (ref 98–111)
Creatinine, Ser: 0.71 mg/dL (ref 0.61–1.24)
GFR, Estimated: >60 mL/min (ref >60)
Glucose, Bld: 106 mg/dL — ABNORMAL HIGH (ref 70–99)
Potassium: 4.3 mmol/L (ref 3.5–5.1)
Sodium: 133 mmol/L — ABNORMAL LOW (ref 135–145)
Total Bilirubin: 0.7 mg/dL (ref 0.3–1.2)
Total Protein: 8.1 g/dL (ref 6.5–8.1)

## 2020-08-03 LAB — CBC WITH DIFFERENTIAL/PLATELET
Abs Immature Granulocytes: 0.02 10*3/uL (ref 0.00–0.07)
Basophils Absolute: 0.1 10*3/uL (ref 0.0–0.1)
Basophils Relative: 1 %
Eosinophils Absolute: 0.1 10*3/uL (ref 0.0–0.5)
Eosinophils Relative: 1 %
HCT: 39.5 % (ref 39.0–52.0)
Hemoglobin: 13.3 g/dL (ref 13.0–17.0)
Immature Granulocytes: 0 %
Lymphocytes Relative: 19 %
Lymphs Abs: 1.4 10*3/uL (ref 0.7–4.0)
MCH: 33.8 pg (ref 26.0–34.0)
MCHC: 33.7 g/dL (ref 30.0–36.0)
MCV: 100.3 fL — ABNORMAL HIGH (ref 80.0–100.0)
Monocytes Absolute: 0.7 10*3/uL (ref 0.1–1.0)
Monocytes Relative: 10 %
Neutro Abs: 5.2 10*3/uL (ref 1.7–7.7)
Neutrophils Relative %: 69 %
Platelets: 219 10*3/uL (ref 150–400)
RBC: 3.94 MIL/uL — ABNORMAL LOW (ref 4.22–5.81)
RDW: 13.6 % (ref 11.5–15.5)
WBC: 7.5 10*3/uL (ref 4.0–10.5)
nRBC: 0 % (ref 0.0–0.2)

## 2020-08-03 LAB — PROTEIN, URINE, RANDOM: Total Protein, Urine: 102 mg/dL

## 2020-08-03 MED ORDER — SODIUM CHLORIDE 0.9% FLUSH
10.0000 mL | INTRAVENOUS | Status: DC | PRN
  Administered 2020-08-03: 10 mL via INTRAVENOUS
  Filled 2020-08-03: qty 10

## 2020-08-03 MED ORDER — HEPARIN SOD (PORK) LOCK FLUSH 100 UNIT/ML IV SOLN
INTRAVENOUS | Status: AC
  Filled 2020-08-03: qty 5

## 2020-08-03 MED ORDER — BEVACIZUMAB CHEMO INJECTION 400 MG/16ML
900.0000 mg | Freq: Once | INTRAVENOUS | Status: AC
Start: 2020-08-03 — End: 2020-08-03
  Administered 2020-08-03: 900 mg via INTRAVENOUS
  Filled 2020-08-03: qty 32

## 2020-08-03 MED ORDER — SODIUM CHLORIDE 0.9 % IV SOLN
Freq: Once | INTRAVENOUS | Status: AC
Start: 2020-08-03 — End: 2020-08-03
  Filled 2020-08-03: qty 250

## 2020-08-03 MED ORDER — HEPARIN SOD (PORK) LOCK FLUSH 100 UNIT/ML IV SOLN
500.0000 [IU] | Freq: Once | INTRAVENOUS | Status: DC
  Administered 2020-08-03: 500 [IU] via INTRAVENOUS
  Filled 2020-08-03: qty 5

## 2020-08-03 MED ORDER — SODIUM CHLORIDE 0.9 % IV SOLN: 15.0000 mg/kg | Freq: Once | INTRAVENOUS | Status: DC

## 2020-08-03 MED ORDER — SODIUM CHLORIDE 0.9 % IV SOLN
1200.0000 mg | Freq: Once | INTRAVENOUS | Status: AC
  Administered 2020-08-03: 1200 mg via INTRAVENOUS
  Filled 2020-08-03: qty 20

## 2020-08-03 NOTE — Progress Notes (Signed)
Patient tolerated Tecentrip & Avastin infusion well today. Urine sample collected and sent to [facility] per lab. Ok per MD to continue with chemo. 2nd urine sample collected and sent. Explained to patient. MD aware. Patient also tolerated phlebotomy well today. 500 cc removed as ordered.  500cc NS IV given. No concerns voiced. Patient discharged. Stable.

## 2020-08-03 NOTE — Progress Notes (Signed)
Hematology/Oncology Consult note [facility]  [phone] [phone]  Patient Care Team: Venita Lick, NP as PCP - General (Nurse Practitioner) Clent Jacks, RN as Oncology Nurse Navigator Sindy Guadeloupe, MD as Consulting Physician (Hematology and Oncology)   Name of the patient: Maxwell Edwards  [id number]  1956-02-02   Date of visit: 08/03/20  Diagnosis- multifocal locally advanced hepatocellular carcinoma Hereditary hemochromatosis  Chief complaint/ Reason for visit-on treatment assessment prior to cycle 5 of a Avastin and Tecentriq  Heme/Onc history: patient is a 65 year old male with a past medical history significant for hypertension hyperlipidemia, 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 Reports that he has lost about 20 pounds over the last 2 to 3 months. He is a chronic smoker which prompted a CT chest. CT chest without contrast on 04/06/2020 showed no evidence of mediastinal or hilar adenopathy. He was found to have 4 subcentimeter lung nodules 3 in the right lobe and one in the left upper lobe which were borderline for metastatic disease. Cirrhotic liver morphology with 8.3 x 7 cm ill-defined hypoattenuating lesion in the left liver with a 3.3 cm lesion in the dome of the liver near the junction of the right and left hepatic lobes. Labs showed elevated AST and ALT of 62 and 47 respectively with a normal albumin and normal total bilirubin. CMP showed elevated H&H of 18.3/53.8. 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 Another mass in the segment 4A measuring 3.4 x 2.9 cm.No evidence of local regional  adenopathy.  Alpha-fetoprotein elevated at 38,000. CA 19-9 elevated at 147.Patient's case discussed at tumor board and consensus was that this would be consistent with [facility] especially given the AFP value. Initially MRI was not characteristic for [facility] but a biopsy was not deemed necessary after AFP results were back  Interval history-patient reports doing well and denies any specific complaints at this time other than mild fatigue.He is down to 133 pounds today as compared to 138 pounds 3 weeks ago but back then he had some leg swelling as well which has improved.  Reports being active.  States that his stool is clay colored   ECOG PS- 1 Pain scale- 0 Opioid associated constipation- no  Review of systems- Review of Systems  Constitutional: Positive for malaise/fatigue. Negative for chills, fever and weight loss.  HENT: Negative for congestion, ear discharge and nosebleeds.   Eyes: Negative for blurred vision.  Respiratory: Negative for cough, hemoptysis, sputum production, shortness of breath and wheezing.   Cardiovascular: Negative for chest pain, palpitations, orthopnea and claudication.  Gastrointestinal: Negative for abdominal pain, blood in stool, constipation, diarrhea, heartburn, melena, nausea and vomiting.  Genitourinary: Negative for dysuria, flank pain, frequency, hematuria and urgency.  Musculoskeletal: Negative for back pain, joint pain and myalgias.  Skin: Negative for rash.  Neurological: Negative for dizziness, tingling, focal weakness, seizures, weakness and headaches.  Endo/Heme/Allergies: Does not bruise/bleed easily.  Psychiatric/Behavioral: Negative for depression and suicidal ideas. The patient does not have insomnia.        No Known Allergies   Past Medical History:  Diagnosis Date  . Barrett esophagus   . CAD (coronary artery disease)   .  Cancer [facility])    liver cancer  . Cirrhosis ([place])   . Heart disease      Past Surgical History:  Procedure  Laterality Date  . CORONARY ARTERY BYPASS GRAFT     at 65 years old  . ESOPHAGOGASTRODUODENOSCOPY (EGD) WITH PROPOFOL N/A 05/21/2020   Procedure: ESOPHAGOGASTRODUODENOSCOPY (EGD) WITH PROPOFOL;  Surgeon: Lin Landsman, MD;  Location: [facility];  Service: Gastroenterology;  Laterality: N/A;  . LAPAROSCOPIC LYSIS OF ADHESIONS  06/12/2020   Procedure: LAPAROSCOPIC LYSIS OF ADHESIONS;  Surgeon: Jules Husbands, MD;  Location: [facility] ORS;  Service: General;;  . PORTA CATH INSERTION N/A 04/30/2020   Procedure: PORTA CATH INSERTION;  Surgeon: Algernon Huxley, MD;  Location: [facility] CV LAB;  Service: Cardiovascular;  Laterality: N/A;  . VEIN BYPASS SURGERY      Social History   Socioeconomic History  . Marital status: Divorced    Spouse name: Not on file  . Number of children: 2  . Years of education: Not on file  . Highest education level: Not on file  Occupational History  . 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[employer]  . Smoking status: Current Some Day Smoker    Packs/day: 0.25    Types: Cigarettes  . Smokeless tobacco: Never Used  Vaping Use  . Vaping Use: Never used  Substance and Sexual Activity  . Alcohol use: Not Currently  . Drug use: Not Currently  . Sexual activity: Not Currently  Other Topics Concern  . Not on file  Social History Narrative   Lives by himself    Social Determinants of Health   Financial Resource Strain: Not on file  Food Insecurity: Not on file  Transportation Needs: Not on file  Physical Activity: Not on file  Stress: Not on file  Social Connections: Not on file  Intimate Partner Violence: Not on file    Family History  Problem Relation Age of Onset  . Dementia Mother   . Ulcers Father   . Heart attack Brother   . Throat cancer Brother   . Heart attack Daughter   . Diabetes Daughter   . Depression Daughter      Current Outpatient Medications:  .  folic acid (FOLVITE) 1 MG tablet, TAKE 2 TABLETS BY MOUTH  EVERY DAY, Disp: 60 tablet, Rfl: 0 .  lidocaine-prilocaine (EMLA) cream, Apply to affected area once (Patient taking differently: Apply 1 application topically See admin instructions. Apply to affected area once as needed), Disp: 30 g, Rfl: 3 .  mirtazapine (REMERON) 15 MG tablet, Take 1 tablet (15 mg total) by mouth at bedtime., Disp: 30 tablet, Rfl: 2 .  omeprazole (PRILOSEC) 40 MG capsule, Take 1 capsule by mouth every morning., Disp: , Rfl:  .  ondansetron (ZOFRAN) 8 MG tablet, Take 1 tablet (8 mg total) by mouth 2 (two) times daily as needed (Nausea or vomiting)., Disp: 45 tablet, Rfl: 2 .  prochlorperazine (COMPAZINE) 10 MG tablet, Take 1 tablet (10 mg total) by mouth every 6 (six) hours as needed (Nausea or vomiting)., Disp: 30 tablet, Rfl: 1 .  fluticasone (FLONASE) 50 MCG/ACT nasal spray, Place into both nostrils. (Patient not taking: Reported on 08/03/2020), Disp: , Rfl:  .  HYDROcodone-acetaminophen (NORCO/VICODIN) 5-325 MG tablet, Take 1 tablet by mouth every 6 (six) hours as needed for moderate pain or severe pain. (Patient not taking: No sig reported), Disp: 60 tablet, Rfl: 0 .  lactulose (CHRONULAC) 10 GM/15ML solution, Take  15 mLs (10 g total) by mouth daily. (Patient not taking: Reported on 08/03/2020), Disp: 236 mL, Rfl: 1 No current facility-administered medications for this visit.  Facility-Administered Medications Ordered in Other Visits:  .  bevacizumab (AVASTIN) 900 mg in sodium chloride 0.9 % 100 mL chemo infusion, 900 mg, Intravenous, Once, Sindy Guadeloupe, MD, Last Rate: 272 mL/hr at 08/03/20 1313, 900 mg at 08/03/20 1313  Physical exam:  Vitals:   08/03/20 0936  BP: 114/71  Pulse: 86  Resp: 18  Temp: 98.6 F (37 C)  TempSrc: Tympanic  SpO2: 100%  Weight: 133 lb 8 oz (60.6 kg)   Physical Exam Cardiovascular:     Rate and Rhythm: Normal rate and regular rhythm.     Heart sounds: Normal heart sounds.  Pulmonary:     Effort: Pulmonary effort is normal.     Breath  sounds: Normal breath sounds.  Abdominal:     General: Bowel sounds are normal.     Palpations: Abdomen is soft.  Musculoskeletal:     Right lower leg: No edema.     Left lower leg: No edema.  Skin:    General: Skin is warm and dry.  Neurological:     Mental Status: He is alert and oriented to person, place, and time.      CMP Latest Ref Rng & Units 08/03/2020  Glucose 70 - 99 mg/dL 106(H)  BUN 8 - 23 mg/dL 18  Creatinine 0.61 - 1.24 mg/dL 0.71  Sodium 135 - 145 mmol/L 133(L)  Potassium 3.5 - 5.1 mmol/L 4.3  Chloride 98 - 111 mmol/L 98  CO2 22 - 32 mmol/L 25  Calcium 8.9 - 10.3 mg/dL 8.9  Total Protein 6.5 - 8.1 g/dL 8.1  Total Bilirubin 0.3 - 1.2 mg/dL 0.7  Alkaline Phos 38 - 126 U/L 647(H)  AST 15 - 41 U/L 108(H)  ALT 0 - 44 U/L 85(H)   CBC Latest Ref Rng & Units 08/03/2020  WBC 4.0 - 10.5 K/uL 7.5  Hemoglobin 13.0 - 17.0 g/dL 13.3  Hematocrit 39.0 - 52.0 % 39.5  Platelets 150 - 400 K/uL 219     Assessment and plan- Patient is a 65 y.o. male with locally advanced multifocal hepatocellular carcinoma secondary to homozygosity for hereditary hemochromatosis here for on treatment assessment prior to cycle 4 of Tecentriq and Avastin chemotherapy  Counts okay to proceed with cycle 4 of Tecentriq and Avastin chemotherapy today.  Urine protein is still pending.  He did have some Proteinuria close to 1 his prior sample but 24-hour urine protein showed 675 mg of protein and therefore a Avastin has not been held so far.  AFP is trending down  I will see him back in 3 weeks for cycle 5.  Plan to repeat scans after 7 cycles.  Hereditary hemochromatosis: He will continue to receive weekly phlebotomies at this time.  I have asked him to consider twice weekly phlebotomies again given that his hemoglobin is improved to 13.  Patient would like to think about it.  He does get IV fluids when he comes for phlebotomy.  Abnormal LFTs: Secondary to iron overload and hemochromatosis.  Stable  continue to monitor.  Bilirubin has normalized  Clay colored stools: Bilirubin is normal and therefore I am not concerned about the color of his stool at this time.  Continue to monitor   Visit Diagnosis 1. Cancer, hepatocellular ([place])   2. Encounter for antineoplastic chemotherapy   3. Hereditary hemochromatosis ([place])   4.  Iron overload      Dr. Randa Evens, MD, MPH [facility] [phone] 08/03/2020 1:21 PM

## 2020-08-03 NOTE — Patient Instructions (Signed)
[facility]  Discharge Instructions: Thank you for choosing [facility] to provide your oncology and hematology care.  If you have a lab appointment with the [facility], please go directly to the [facility] and check in at the registration area.  Wear comfortable clothing and clothing appropriate for easy access to any Portacath or PICC line.   We strive to give you quality time with your provider. You may need to reschedule your appointment if you arrive late (15 or more minutes).  Arriving late affects you and other patients whose appointments are after yours.  Also, if you miss three or more appointments without notifying the office, you may be dismissed from the clinic at the provider's discretion.      For prescription refill requests, have your pharmacy contact our office and allow 72 hours for refills to be completed.    Today you received the following chemotherapy and/or immunotherapy agents : Tecentriq/ Avastin   To help prevent nausea and vomiting after your treatment, we encourage you to take your nausea medication as directed.  BELOW ARE SYMPTOMS THAT SHOULD BE REPORTED IMMEDIATELY: . *FEVER GREATER THAN 100.4 F (38 C) OR HIGHER . *CHILLS OR SWEATING . *NAUSEA AND VOMITING THAT IS NOT CONTROLLED WITH YOUR NAUSEA MEDICATION . *UNUSUAL SHORTNESS OF BREATH . *UNUSUAL BRUISING OR BLEEDING . *URINARY PROBLEMS (pain or burning when urinating, or frequent urination) . *BOWEL PROBLEMS (unusual diarrhea, constipation, pain near the anus) . TENDERNESS IN MOUTH AND THROAT WITH OR WITHOUT PRESENCE OF ULCERS (sore throat, sores in mouth, or a toothache) . UNUSUAL RASH, SWELLING OR PAIN  . UNUSUAL VAGINAL DISCHARGE OR ITCHING   Items with * indicate a potential emergency and should be followed up as soon as possible or go to the Emergency Department if any problems should occur.  Please show the CHEMOTHERAPY ALERT CARD or  IMMUNOTHERAPY ALERT CARD at check-in to the Emergency Department and triage nurse.  Should you have questions after your visit or need to cancel or reschedule your appointment, please contact [facility]  [phone] and follow the prompts.  Office hours are 8:00 a.m. to 4:30 p.m. Monday - Friday. Please note that voicemails left after 4:00 p.m. may not be returned until the following business day.  We are closed weekends and major holidays. You have access to a nurse at all times for urgent questions. Please call the main number to the clinic [phone] and follow the prompts.  For any non-urgent questions, you may also contact your provider using MyChart. We now offer e-Visits for anyone 67 and older to request care online for non-urgent symptoms. For details visit [email or web address].   Also download the MyChart app! Go to the app store, search "MyChart", open the app, select [facility], and log in with your MyChart username and password.  Due to Covid, a mask is required upon entering the hospital/clinic. If you do not have a mask, one will be given to you upon arrival. For doctor visits, patients may have 1 support person aged 59 or older with them. For treatment visits, patients cannot have anyone with them due to current Covid guidelines and our immunocompromised population.

## 2020-08-03 NOTE — Progress Notes (Signed)
Per MD ok to adjust dose down for weight loss. Ok to give tecentriq first and wait for Uprotein before giving avastin.

## 2020-08-03 NOTE — Progress Notes (Signed)
Nutrition Follow-up:  Patient with advanced hepatocellular cancer.  G-tube not placed due to massive hepatocellular carcinoma left lobe of liver and stomach unable to reach abdominal wall due to liver cancer per surgery note.  Patient continues with chemotherapy and phlebotomy.  Met with patient during infusion.  Reports that appetite is good.  Says he ate toast yesterday for breakfast. Chicken breast and coleslaw for lunch.  Dinner was 1/2 can of cream of mushroom soup.  Drinking about 1 ensure shakes per day.  Continues to drink milk.     Medications: reviewed  Labs: reviewed  Anthropometrics:   Weight 133 lb 8 oz today  138 lb on 5/6 132 lb on 4/8 134 lb on 4/4 145 lb on 3/22   NUTRITION DIAGNOSIS: Inadequate oral intake stable     INTERVENTION:  Recommend increase to 2 shakes per day.   Continue high calorie and high protein foods    MONITORING, EVALUATION, GOAL: weight trends, intake   NEXT VISIT: Friday, June 17 during infusion  Seidy Labreck B. Zenia Resides, [place], [place] Registered Dietitian [phone] (mobile)

## 2020-08-08 LAB — PROTEIN ELECTRO, RANDOM URINE
Albumin ELP, Urine: 61.5 %
Alpha-1-Globulin, U: 1.9 %
Alpha-2-Globulin, U: 11 %
Beta Globulin, U: 14.6 %
Gamma Globulin, U: 11.1 %
Total Protein, Urine: 170.7 mg/dL

## 2020-08-10 ENCOUNTER — Inpatient Hospital Stay: Payer: 59

## 2020-08-10 ENCOUNTER — Inpatient Hospital Stay: Payer: 59 | Attending: Oncology

## 2020-08-10 DIAGNOSIS — C220 Liver cell carcinoma: Secondary | ICD-10-CM | POA: Insufficient documentation

## 2020-08-10 DIAGNOSIS — Z5112 Encounter for antineoplastic immunotherapy: Secondary | ICD-10-CM | POA: Insufficient documentation

## 2020-08-10 DIAGNOSIS — Z79899 Other long term (current) drug therapy: Secondary | ICD-10-CM | POA: Diagnosis not present

## 2020-08-10 LAB — HEMOGLOBIN AND HEMATOCRIT, BLOOD
HCT: 39.7 % (ref 39.0–52.0)
Hemoglobin: 13.3 g/dL (ref 13.0–17.0)

## 2020-08-10 MED ORDER — SODIUM CHLORIDE 0.9 % IV SOLN
Freq: Once | INTRAVENOUS | Status: AC
  Filled 2020-08-10: qty 250

## 2020-08-10 MED ORDER — SODIUM CHLORIDE 0.9% FLUSH
10.0000 mL | INTRAVENOUS | Status: DC | PRN
  Administered 2020-08-10: 10 mL via INTRAVENOUS
  Filled 2020-08-10: qty 10

## 2020-08-10 MED ORDER — HEPARIN SOD (PORK) LOCK FLUSH 100 UNIT/ML IV SOLN
500.0000 [IU] | Freq: Once | INTRAVENOUS | Status: AC
  Administered 2020-08-10: 500 [IU] via INTRAVENOUS
  Filled 2020-08-10: qty 5

## 2020-08-10 NOTE — Patient Instructions (Signed)

## 2020-08-10 NOTE — Progress Notes (Signed)
Hgb 13.3. Proceeded with 500 ml phlebotomy via PIC in LAC. Pt receiving 1 liter of NS via port.

## 2020-08-15 ENCOUNTER — Other Ambulatory Visit: Payer: Self-pay | Admitting: *Deleted

## 2020-08-15 MED ORDER — LACTULOSE 10 GM/15ML PO SOLN: 10.0000 g | Freq: Every day | ORAL | 1 refills | Status: DC

## 2020-08-17 ENCOUNTER — Other Ambulatory Visit: Payer: Self-pay | Admitting: *Deleted

## 2020-08-17 ENCOUNTER — Inpatient Hospital Stay: Payer: 59

## 2020-08-17 DIAGNOSIS — Z5112 Encounter for antineoplastic immunotherapy: Secondary | ICD-10-CM | POA: Diagnosis not present

## 2020-08-17 LAB — HEMOGLOBIN: Hemoglobin: 13.2 g/dL (ref 13.0–17.0)

## 2020-08-17 LAB — HEMATOCRIT: HCT: 38.8 % — ABNORMAL LOW (ref 39.0–52.0)

## 2020-08-17 MED ORDER — SODIUM CHLORIDE 0.9 % IV SOLN
Freq: Once | INTRAVENOUS | Status: AC
  Filled 2020-08-17: qty 250

## 2020-08-17 MED ORDER — HEPARIN SOD (PORK) LOCK FLUSH 100 UNIT/ML IV SOLN
500.0000 [IU] | Freq: Once | INTRAVENOUS | Status: AC
  Administered 2020-08-17: 500 [IU] via INTRAVENOUS
  Filled 2020-08-17: qty 5

## 2020-08-17 MED ORDER — SODIUM CHLORIDE 0.9% FLUSH
10.0000 mL | INTRAVENOUS | Status: DC | PRN
  Administered 2020-08-17: 10 mL via INTRAVENOUS
  Filled 2020-08-17: qty 10

## 2020-08-17 MED ORDER — LACTULOSE 10 GM/15ML PO SOLN: 10.0000 g | Freq: Every day | ORAL | 1 refills | Status: DC

## 2020-08-17 NOTE — Progress Notes (Signed)
500 ml Blood removed per phlebotomy parameters. 1 liter of NS hydration replacement. Pt tolerated procedure well. VSS. Discharged to home.

## 2020-08-24 ENCOUNTER — Inpatient Hospital Stay: Payer: 59

## 2020-08-24 ENCOUNTER — Inpatient Hospital Stay (HOSPITAL_BASED_OUTPATIENT_CLINIC_OR_DEPARTMENT_OTHER): Payer: 59 | Admitting: Oncology

## 2020-08-24 ENCOUNTER — Other Ambulatory Visit: Payer: Self-pay

## 2020-08-24 ENCOUNTER — Encounter: Payer: Self-pay | Admitting: Oncology

## 2020-08-24 VITALS — BP 117/71 | HR 87

## 2020-08-24 DIAGNOSIS — C220 Liver cell carcinoma: Secondary | ICD-10-CM | POA: Diagnosis not present

## 2020-08-24 DIAGNOSIS — R808 Other proteinuria: Secondary | ICD-10-CM | POA: Diagnosis not present

## 2020-08-24 DIAGNOSIS — Z5112 Encounter for antineoplastic immunotherapy: Secondary | ICD-10-CM | POA: Diagnosis not present

## 2020-08-24 DIAGNOSIS — Z5111 Encounter for antineoplastic chemotherapy: Secondary | ICD-10-CM

## 2020-08-24 DIAGNOSIS — R945 Abnormal results of liver function studies: Secondary | ICD-10-CM

## 2020-08-24 DIAGNOSIS — R7989 Other specified abnormal findings of blood chemistry: Secondary | ICD-10-CM

## 2020-08-24 LAB — CBC WITH DIFFERENTIAL/PLATELET
Abs Immature Granulocytes: 0.05 10*3/uL (ref 0.00–0.07)
Basophils Absolute: 0.1 10*3/uL (ref 0.0–0.1)
Basophils Relative: 1 %
Eosinophils Absolute: 0.1 10*3/uL (ref 0.0–0.5)
Eosinophils Relative: 1 %
HCT: 41.5 % (ref 39.0–52.0)
Hemoglobin: 13.9 g/dL (ref 13.0–17.0)
Immature Granulocytes: 0 %
Lymphocytes Relative: 14 %
Lymphs Abs: 1.7 10*3/uL (ref 0.7–4.0)
MCH: 33.4 pg (ref 26.0–34.0)
MCHC: 33.5 g/dL (ref 30.0–36.0)
MCV: 99.8 fL (ref 80.0–100.0)
Monocytes Absolute: 1 10*3/uL (ref 0.1–1.0)
Monocytes Relative: 8 %
Neutro Abs: 9.5 10*3/uL — ABNORMAL HIGH (ref 1.7–7.7)
Neutrophils Relative %: 76 %
Platelets: 247 10*3/uL (ref 150–400)
RBC: 4.16 MIL/uL — ABNORMAL LOW (ref 4.22–5.81)
RDW: 13.7 % (ref 11.5–15.5)
WBC: 12.4 10*3/uL — ABNORMAL HIGH (ref 4.0–10.5)
nRBC: 0 % (ref 0.0–0.2)

## 2020-08-24 LAB — COMPREHENSIVE METABOLIC PANEL
ALT: 97 U/L — ABNORMAL HIGH (ref 0–44)
AST: 109 U/L — ABNORMAL HIGH (ref 15–41)
Albumin: 3.1 g/dL — ABNORMAL LOW (ref 3.5–5.0)
Alkaline Phosphatase: 756 U/L — ABNORMAL HIGH (ref 38–126)
Anion gap: 8 (ref 5–15)
BUN: 18 mg/dL (ref 8–23)
CO2: 26 mmol/L (ref 22–32)
Calcium: 9.3 mg/dL (ref 8.9–10.3)
Chloride: 98 mmol/L (ref 98–111)
Creatinine, Ser: 0.59 mg/dL — ABNORMAL LOW (ref 0.61–1.24)
GFR, Estimated: >60 mL/min (ref >60)
Glucose, Bld: 122 mg/dL — ABNORMAL HIGH (ref 70–99)
Potassium: 4.3 mmol/L (ref 3.5–5.1)
Sodium: 132 mmol/L — ABNORMAL LOW (ref 135–145)
Total Bilirubin: 0.3 mg/dL (ref 0.3–1.2)
Total Protein: 8.7 g/dL — ABNORMAL HIGH (ref 6.5–8.1)

## 2020-08-24 LAB — PROTEIN, URINE, RANDOM: Total Protein, Urine: 174 mg/dL

## 2020-08-24 MED ORDER — BEVACIZUMAB CHEMO INJECTION 400 MG/16ML
15.0000 mg/kg | Freq: Once | INTRAVENOUS | Status: AC
Start: 2020-08-24 — End: 2020-08-24
  Administered 2020-08-24: 900 mg via INTRAVENOUS
  Filled 2020-08-24: qty 16

## 2020-08-24 MED ORDER — SODIUM CHLORIDE 0.9% FLUSH
10.0000 mL | Freq: Once | INTRAVENOUS | Status: AC
Start: 2020-08-24 — End: 2020-08-24
  Administered 2020-08-24: 10 mL via INTRAVENOUS
  Filled 2020-08-24: qty 10

## 2020-08-24 MED ORDER — BEVACIZUMAB CHEMO INJECTION 400 MG/16ML: 15.0000 mg/kg | Freq: Once | INTRAVENOUS | Status: DC

## 2020-08-24 MED ORDER — SODIUM CHLORIDE 0.9 % IV SOLN
Freq: Once | INTRAVENOUS | Status: AC
  Filled 2020-08-24: qty 250

## 2020-08-24 MED ORDER — SODIUM CHLORIDE 0.9 % IV SOLN
1200.0000 mg | Freq: Once | INTRAVENOUS | Status: AC
  Administered 2020-08-24: 1200 mg via INTRAVENOUS
  Filled 2020-08-24: qty 20

## 2020-08-24 MED ORDER — HEPARIN SOD (PORK) LOCK FLUSH 100 UNIT/ML IV SOLN
500.0000 [IU] | Freq: Once | INTRAVENOUS | Status: AC
  Administered 2020-08-24: 500 [IU] via INTRAVENOUS
  Filled 2020-08-24: qty 5

## 2020-08-24 MED ORDER — HEPARIN SOD (PORK) LOCK FLUSH 100 UNIT/ML IV SOLN
500.0000 [IU] | Freq: Once | INTRAVENOUS | Status: DC | PRN
  Filled 2020-08-24: qty 5

## 2020-08-24 MED ORDER — HEPARIN SOD (PORK) LOCK FLUSH 100 UNIT/ML IV SOLN
INTRAVENOUS | Status: AC
  Filled 2020-08-24: qty 5

## 2020-08-24 NOTE — Progress Notes (Signed)
Nutrition Follow-up:    Patient with advanced hepatocellular cancer. Patient receiving chemotherapy and phlebotomy.  Met with patient during infusion.  Patient reports that he is eating well.  Trying to drink 2 ensure per day.  Did not eat breakfast yesterday or this am.  Lunch yesterday was chicken nuggets (8).  Supper last night was ribs, corn and pasta salad.      Medications: reviewed  Labs: reviewed  Anthropometrics:   Weight 134 lb 3.2 oz today  133 lb 8 oz  138 lb on 5/6 132 lb on 4/8 134 lb on 4/4 145 lb on 3/22   NUTRITION DIAGNOSIS: Inadequate oral intake stable    INTERVENTION:  Continue 2 ensure plus per day.  Nursing provided patient with shake during infusion to drink. Encouraged patient if not eating breakfast to drink shake instead.     MONITORING, EVALUATION, GOAL: weight trends, intake   NEXT VISIT: as needed during treatment  Fermon Ureta B. Zenia Resides, [place], [place] Registered Dietitian [phone] (mobile)

## 2020-08-24 NOTE — Progress Notes (Signed)
AST and ALT elevated, within MD parameters, last urine protein on 08/03/20 was 102. Pt provided urine sample that was sent to lab, last ferritin 07/03/20. Per Dr. Janese Banks proceed with Tecentriq and Avastin with 08/24/20 labs and prior to urine protein results, proceed with 500cc phlebotomy and 500cc NS over 30 minutes post phlebotomy. Ferritin to be checked at next visit.   7169: Pt tolerated 500 cc phlebotomy well using PIV in rt AC.    1130: Pt tolerated treatment well. Pt stable at discharge.

## 2020-08-24 NOTE — Patient Instructions (Addendum)
[facility] ONCOL[facility]  Discharge Instructions: Thank you for choosing [facility] to provide your oncology and hematology care.  If you have a lab appointment with the [facility], please go directly to the [facility] and check in at the registration area.  Wear comfortable clothing and clothing appropriate for easy access to any Portacath or PICC line.   We strive to give you quality time with your provider. You may need to reschedule your appointment if you arrive late (15 or more minutes).  Arriving late affects you and other patients whose appointments are after yours.  Also, if you miss three or more appointments without notifying the office, you may be dismissed from the clinic at the provider's discretion.      For prescription refill requests, have your pharmacy contact our office and allow 72 hours for refills to be completed.    Today you received the following chemotherapy and/or immunotherapy agents Tecentriq and       To help prevent nausea and vomiting after your treatment, we encourage you to take your nausea medication as directed.  BELOW ARE SYMPTOMS THAT SHOULD BE REPORTED IMMEDIATELY: *FEVER GREATER THAN 100.4 F (38 C) OR HIGHER *CHILLS OR SWEATING *NAUSEA AND VOMITING THAT IS NOT CONTROLLED WITH YOUR NAUSEA MEDICATION *UNUSUAL SHORTNESS OF BREATH *UNUSUAL BRUISING OR BLEEDING *URINARY PROBLEMS (pain or burning when urinating, or frequent urination) *BOWEL PROBLEMS (unusual diarrhea, constipation, pain near the anus) TENDERNESS IN MOUTH AND THROAT WITH OR WITHOUT PRESENCE OF ULCERS (sore throat, sores in mouth, or a toothache) UNUSUAL RASH, SWELLING OR PAIN  UNUSUAL VAGINAL DISCHARGE OR ITCHING   Items with * indicate a potential emergency and should be followed up as soon as possible or go to the Emergency Department if any problems should occur.  Please show the CHEMOTHERAPY ALERT CARD or IMMUNOTHERAPY ALERT CARD at  check-in to the Emergency Department and triage nurse.  Should you have questions after your visit or need to cancel or reschedule your appointment, please contact [facility]  [phone] and follow the prompts.  Office hours are 8:00 a.m. to 4:30 p.m. Monday - Friday. Please note that voicemails left after 4:00 p.m. may not be returned until the following business day.  We are closed weekends and major holidays. You have access to a nurse at all times for urgent questions. Please call the main number to the clinic [phone] and follow the prompts.  For any non-urgent questions, you may also contact your provider using MyChart. We now offer e-Visits for anyone 56 and older to request care online for non-urgent symptoms. For details visit [email or web address].   Also download the MyChart app! Go to the app store, search "MyChart", open the app, select , and log in with your MyChart username and password.  Due to Covid, a mask is required upon entering the hospital/clinic. If you do not have a mask, one will be given to you upon arrival. For doctor visits, patients may have 1 support person aged 71 or older with them. For treatment visits, patients cannot have anyone with them due to current Covid guidelines and our immunocompromised population.    Therapeutic Phlebotomy, Care After This sheet gives you information about how to care for yourself after your procedure. Your health care provider may also give you more specific instructions. If you have problems or questions, contact your health careprovider. What can I expect after the procedure? After the procedure, it is common  to have: Light-headedness or dizziness. You may feel faint. Nausea. Tiredness (fatigue). Follow these instructions at home: Eating and drinking Be sure to eat well-balanced meals for the next 24 hours. Drink enough fluid to keep your urine pale yellow. Avoid drinking  alcohol on the day that you had the procedure. Activity  Return to your normal activities as told by your health care provider. Most people can go back to their normal activities right away. Avoid activities that take a lot of effort for about 5 hours after the procedure. Athletes should avoid strenuous exercise for at least 12 hours. Avoid heavy lifting or pulling for about 5 hours after the procedure. Do not lift anything that is heavier than 10 lb (4.5 kg). Change positions slowly for the remainder of the day. This will help to prevent light-headedness or fainting. If you feel light-headed, lie down until the feeling goes away.  Needle insertion site care  Keep your bandage (dressing) dry. You can remove the bandage after about 5 hours or as told by your health care provider. If you have bleeding from the needle insertion site, raise (elevate) your arm and press firmly on the site until the bleeding stops. If you have bruising at the site, apply ice to the area: Remove the dressing. Put ice in a plastic bag. Place a towel between your skin and the bag. Leave the ice on for 20 minutes, 2-3 times a day for the first 24 hours. If the swelling does not go away after 24 hours, apply a warm, moist cloth (warm compress) to the area for 20 minutes, 2-3 times a day.  General instructions Do not use any products that contain nicotine or tobacco, such as cigarettes and e-cigarettes, for at least 30 minutes after the procedure. Keep all follow-up visits as told by your health care provider. This is important. You may need to continue having regular therapeutic phlebotomy treatments as directed. Contact a health care provider if you: Have redness, swelling, or pain at the needle insertion site. Have fluid or blood coming from the needle insertion site. Have pus or a bad smell coming from the needle insertion site. Notice that the needle insertion site feels warm to the touch. Feel light-headed,  dizzy, or nauseous, and the feeling does not go away. Have new bruising at the needle insertion site. Feel weaker than normal. Have a fever or chills. Get help right away if: You faint. You have chest pain. You have trouble breathing. You have severe nausea or vomiting. Summary After the procedure, it is common to have some light-headedness, dizziness, nausea, or tiredness (fatigue). Be sure to eat well-balanced meals for the next 24 hours. Drink enough fluid to keep your urine pale yellow. Return to your normal activities as told by your health care provider. Keep all follow-up visits as told by your health care provider. You may need to continue having regular therapeutic phlebotomy treatments as directed. This information is not intended to replace advice given to you by your health care provider. Make sure you discuss any questions you have with your healthcare provider. Document Revised: 03/13/2017 Document Reviewed: 03/12/2017 Elsevier Patient Education  [place].

## 2020-08-24 NOTE — Progress Notes (Signed)
Hematology/Oncology Consult note [facility]  Telephone:([phone] [phone]  Patient Care Team: Venita Lick, NP as PCP - General (Nurse Practitioner) Clent Jacks, RN as Oncology Nurse Navigator Sindy Guadeloupe, MD as Consulting Physician (Hematology and Oncology)   Name of the patient: Maxwell Edwards  [id number]  12-25-1955   Date of visit: 08/24/20  Diagnosis- multifocal locally advanced hepatocellular carcinoma Hereditary hemochromatosis  Chief complaint/ Reason for visit-on treatment assessment prior to cycle 6 of a Avastin and Tecentriq  Heme/Onc history:  patient is a 65 year old male with a past medical history significant for hypertension hyperlipidemia, 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  Reports that he has lost about 20 pounds over the last 2 to 3 months. He is a chronic smoker which prompted a CT chest.  CT chest without contrast on 04/06/2020 showed no evidence of mediastinal or hilar adenopathy.  He was found to have 4 subcentimeter lung nodules 3 in the right lobe and one in the left upper lobe which were borderline for metastatic disease.  Cirrhotic liver morphology with 8.3 x 7 cm ill-defined hypoattenuating lesion in the left liver with a 3.3 cm lesion in the dome of the liver near the junction of the right and left hepatic lobes.  Labs showed elevated AST and ALT of 62 and 47 respectively with a normal albumin and normal total bilirubin.  CMP showed elevated H&H of 18.3/53.8.  Hepatitis C and HIV testing was negative. 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  Another mass in the segment 4A measuring 3.4 x 2.9 cm.  No evidence of local regional adenopathy.    Alpha-fetoprotein elevated at 38,000.  CA 19-9 elevated at 147.Patient's case discussed at tumor board and consensus was that this would be consistent with [facility] especially given the AFP value.  Initially MRI was not characteristic for [facility] but a biopsy was not deemed necessary after AFP results were back  Avastin Tecentriq started in March 2022    Interval history- patient reports doing well.  Reports that his appetite is good and he is staying active either inside or outside the house.  ECOG PS- 1 Pain scale- 0   Review of systems- Review of Systems  Constitutional:  Positive for malaise/fatigue. Negative for chills, fever and weight loss.  HENT:  Negative for congestion, ear discharge and nosebleeds.   Eyes:  Negative for blurred vision.  Respiratory:  Negative for cough, hemoptysis, sputum production, shortness of breath and wheezing.   Cardiovascular:  Negative for chest pain, palpitations, orthopnea and claudication.  Gastrointestinal:  Negative for abdominal pain, blood in stool, constipation, diarrhea, heartburn, melena, nausea and vomiting.  Genitourinary:  Negative for dysuria, flank pain, frequency, hematuria and urgency.  Musculoskeletal:  Negative for back pain, joint pain and myalgias.  Skin:  Negative for rash.  Neurological:  Negative for dizziness, tingling, focal weakness, seizures, weakness and headaches.  Endo/Heme/Allergies:  Does not bruise/bleed easily.  Psychiatric/Behavioral:  Negative for depression and suicidal ideas. The patient does not have insomnia.       No Known Allergies   Past Medical History:  Diagnosis Date   Barrett esophagus    CAD (coronary artery disease)  Cancer ([place])    liver cancer   Cirrhosis ([place])    Heart disease      Past Surgical History:  Procedure Laterality Date   CORONARY ARTERY BYPASS GRAFT     at 65 years old   ESOPHAGOGASTRODUODENOSCOPY (EGD) WITH PROPOFOL N/A 05/21/2020   Procedure: ESOPHAGOGASTRODUODENOSCOPY (EGD)  WITH PROPOFOL;  Surgeon: Lin Landsman, MD;  Location: [place];  Service: Gastroenterology;  Laterality: N/A;   LAPAROSCOPIC LYSIS OF ADHESIONS  06/12/2020   Procedure: LAPAROSCOPIC LYSIS OF ADHESIONS;  Surgeon: Jules Husbands, MD;  Location: [facility];  Service: General;;   PORTA CATH INSERTION N/A 04/30/2020   Procedure: PORTA CATH INSERTION;  Surgeon: Algernon Huxley, MD;  Location: [place] [facility];  Service: Cardiovascular;  Laterality: N/A;   VEIN BYPASS SURGERY      Social History   Socioeconomic History   Marital status: Divorced    Spouse name: Not on file   Number of children: 2   Years of education: Not on file   Highest education level: Not on file  Occupational History   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[employer] Use   Smoking status: Some Days    Packs/day: 0.25    Pack years: 0.00    Types: Cigarettes   Smokeless tobacco: Never  Vaping Use   Vaping Use: Never used  Substance and Sexual Activity   Alcohol use: Not Currently   Drug use: Not Currently   Sexual activity: Not Currently  Other Topics Concern   Not on file  Social History Narrative   Lives by himself    Social Determinants of Health   Financial Resource Strain: Not on file  Food Insecurity: Not on file  Transportation Needs: Not on file  Physical Activity: Not on file  Stress: Not on file  Social Connections: Not on file  Intimate Partner Violence: Not on file    Family History  Problem Relation Age of Onset   Dementia Mother    Ulcers Father    Heart attack Brother    Throat cancer Brother    Heart attack Daughter    Diabetes Daughter    Depression Daughter      Current Outpatient Medications:    folic acid (FOLVITE) 1 MG tablet, TAKE 2 TABLETS BY MOUTH EVERY DAY, Disp: 60 tablet, Rfl: 0   HYDROcodone-acetaminophen (NORCO/VICODIN) 5-325 MG tablet, Take 1 tablet by mouth every 6 (six) hours as needed for moderate pain or severe pain., Disp: 60 tablet,  Rfl: 0   lactulose (CHRONULAC) 10 GM/15ML solution, Take 15 mLs (10 g total) by mouth daily., Disp: 236 mL, Rfl: 1   lidocaine-prilocaine (EMLA) cream, Apply to affected area once (Patient taking differently: Apply 1 application topically See admin instructions. Apply to affected area once as needed), Disp: 30 g, Rfl: 3   mirtazapine (REMERON) 15 MG tablet, Take 1 tablet (15 mg total) by mouth at bedtime., Disp: 30 tablet, Rfl: 2   omeprazole (PRILOSEC) 40 MG capsule, Take 1 capsule by mouth every morning., Disp: , Rfl:    ondansetron (ZOFRAN) 8 MG tablet, Take 1 tablet (8 mg total) by mouth 2 (two) times daily as needed (Nausea or vomiting)., Disp: 45 tablet, Rfl: 2   prochlorperazine (COMPAZINE) 10 MG tablet, Take 1 tablet (10 mg total) by mouth every 6 (six) hours as needed (Nausea or vomiting)., Disp: 30 tablet, Rfl: 1   fluticasone (FLONASE) 50 MCG/ACT nasal spray, Place into both nostrils. (  Patient not taking: Reported on 08/24/2020), Disp: , Rfl:   Physical exam:  Vitals:   08/24/20 0829  BP: 136/84  Pulse: 94  Resp: 17  Temp: 98.8 F (37.1 C)  SpO2: 100%  Weight: 134 lb 3.2 oz (60.9 kg)  Height: 5\' 9"  (1.753 m)   Physical Exam Constitutional:      General: He is not in acute distress. Cardiovascular:     Rate and Rhythm: Normal rate and regular rhythm.     Heart sounds: Normal heart sounds.  Pulmonary:     Effort: Pulmonary effort is normal.     Breath sounds: Normal breath sounds.  Abdominal:     General: Bowel sounds are normal.     Palpations: Abdomen is soft.  Musculoskeletal:     Right lower leg: No edema.     Left lower leg: No edema.  Skin:    General: Skin is warm and dry.  Neurological:     Mental Status: He is alert and oriented to person, place, and time.     CMP Latest Ref Rng & Units 08/24/2020  Glucose 70 - 99 mg/dL 122(H)  BUN 8 - 23 mg/dL 18  Creatinine 0.61 - 1.24 mg/dL 0.59(L)  Sodium 135 - 145 mmol/L 132(L)  Potassium 3.5 - 5.1 mmol/L 4.3   Chloride 98 - 111 mmol/L 98  CO2 22 - 32 mmol/L 26  Calcium 8.9 - 10.3 mg/dL 9.3  Total Protein 6.5 - 8.1 g/dL 8.7(H)  Total Bilirubin 0.3 - 1.2 mg/dL 0.3  Alkaline Phos 38 - 126 U/L 756(H)  AST 15 - 41 U/L 109(H)  ALT 0 - 44 U/L 97(H)   CBC Latest Ref Rng & Units 08/24/2020  WBC 4.0 - 10.5 K/uL 12.4(H)  Hemoglobin 13.0 - 17.0 g/dL 13.9  Hematocrit 39.0 - 52.0 % 41.5  Platelets 150 - 400 K/uL 247      Assessment and plan- Patient is a 65 y.o. male with locally advanced hepatocellular carcinoma here for on treatment assessment prior to cycle 6 of a Avastin and Tecentriq  Counts okay to proceed with cycle 6 of a Avastin Tecentriq today.  I will see him back in 3 weeks for cycle 7.  aFP from today is pending but overall trending down.  Noted to have 174 mg of protein on his random urine sample which is higher as compared to before when it was 102.  We have done a 24-hour protein in the past which was less than 2 g and therefore a Avastin was continued.  I will repeat another 24-hour protein at this time and decide about a Avastin with the next cycle.  Hereditary hemochromatosis: Continue weekly phlebotomies.  Patient is currently undecided if he wants to come for phlebotomies twice a week.  Abnormal LFTs: Likely secondary to iron overload from her degree hemochromatosis.  Overall stable continue to monitor   Visit Diagnosis 1. Hereditary hemochromatosis ([place])   2. Cancer, hepatocellular ([place])   3. Abnormal LFTs   4. Other proteinuria   5. Encounter for monoclonal antibody treatment for malignancy   6. Encounter for antineoplastic immunotherapy      Dr. Randa Evens, MD, MPH [facility] at [facility] [phone] 08/24/2020 4:37 PM

## 2020-08-25 LAB — AFP TUMOR MARKER: AFP, Serum, Tumor Marker: 483 ng/mL — ABNORMAL HIGH (ref 0.0–8.4)

## 2020-08-27 ENCOUNTER — Encounter: Payer: Self-pay | Admitting: Oncology

## 2020-08-28 ENCOUNTER — Other Ambulatory Visit: Payer: Self-pay | Admitting: *Deleted

## 2020-08-28 MED ORDER — LORAZEPAM 0.5 MG PO TABS: 0.5000 mg | ORAL_TABLET | Freq: Three times a day (TID) | ORAL | 0 refills | Status: DC

## 2020-08-28 NOTE — Telephone Encounter (Signed)
We can try ativan 0.5 mg Q8 prn for his tremors and see if it helps

## 2020-08-30 ENCOUNTER — Inpatient Hospital Stay (HOSPITAL_BASED_OUTPATIENT_CLINIC_OR_DEPARTMENT_OTHER): Payer: 59 | Admitting: Hospice and Palliative Medicine

## 2020-08-30 DIAGNOSIS — Z515 Encounter for palliative care: Secondary | ICD-10-CM

## 2020-08-30 NOTE — Progress Notes (Signed)
I called and spoke with patient and his ex-wife.  Patient was unavailable at time of scheduled visit.  She reports the patient is doing well.  His appetite and performance status are stable.  The only complaint tremors.  Dr. Janese Banks had recommended lorazepam but they have yet to try this.  If that fails, could consider primidone or propanolol.  Family will let us know how he is doing.

## 2020-08-31 ENCOUNTER — Inpatient Hospital Stay: Payer: 59

## 2020-08-31 ENCOUNTER — Other Ambulatory Visit: Payer: Self-pay | Admitting: Oncology

## 2020-08-31 ENCOUNTER — Encounter: Payer: Self-pay | Admitting: *Deleted

## 2020-08-31 ENCOUNTER — Other Ambulatory Visit: Payer: Self-pay

## 2020-08-31 VITALS — BP 124/78 | HR 59 | Temp 98.3°F | Resp 18

## 2020-08-31 DIAGNOSIS — Z5112 Encounter for antineoplastic immunotherapy: Secondary | ICD-10-CM | POA: Diagnosis not present

## 2020-08-31 DIAGNOSIS — C220 Liver cell carcinoma: Secondary | ICD-10-CM

## 2020-08-31 LAB — CBC WITH DIFFERENTIAL/PLATELET
Abs Immature Granulocytes: 0.03 10*3/uL (ref 0.00–0.07)
Basophils Absolute: 0.1 10*3/uL (ref 0.0–0.1)
Basophils Relative: 1 %
Eosinophils Absolute: 0.1 10*3/uL (ref 0.0–0.5)
Eosinophils Relative: 1 %
HCT: 40.7 % (ref 39.0–52.0)
Hemoglobin: 13.7 g/dL (ref 13.0–17.0)
Immature Granulocytes: 0 %
Lymphocytes Relative: 13 %
Lymphs Abs: 1.5 10*3/uL (ref 0.7–4.0)
MCH: 33.6 pg (ref 26.0–34.0)
MCHC: 33.7 g/dL (ref 30.0–36.0)
MCV: 99.8 fL (ref 80.0–100.0)
Monocytes Absolute: 0.9 10*3/uL (ref 0.1–1.0)
Monocytes Relative: 8 %
Neutro Abs: 9.3 10*3/uL — ABNORMAL HIGH (ref 1.7–7.7)
Neutrophils Relative %: 77 %
Platelets: 216 10*3/uL (ref 150–400)
RBC: 4.08 MIL/uL — ABNORMAL LOW (ref 4.22–5.81)
RDW: 13.4 % (ref 11.5–15.5)
WBC: 11.9 10*3/uL — ABNORMAL HIGH (ref 4.0–10.5)
nRBC: 0 % (ref 0.0–0.2)

## 2020-08-31 LAB — COMPREHENSIVE METABOLIC PANEL
ALT: 70 U/L — ABNORMAL HIGH (ref 0–44)
AST: 80 U/L — ABNORMAL HIGH (ref 15–41)
Albumin: 3.1 g/dL — ABNORMAL LOW (ref 3.5–5.0)
Alkaline Phosphatase: 558 U/L — ABNORMAL HIGH (ref 38–126)
Anion gap: 8 (ref 5–15)
BUN: 18 mg/dL (ref 8–23)
CO2: 24 mmol/L (ref 22–32)
Calcium: 9.2 mg/dL (ref 8.9–10.3)
Chloride: 100 mmol/L (ref 98–111)
Creatinine, Ser: 0.64 mg/dL (ref 0.61–1.24)
GFR, Estimated: >60 mL/min (ref >60)
Glucose, Bld: 152 mg/dL — ABNORMAL HIGH (ref 70–99)
Potassium: 4.2 mmol/L (ref 3.5–5.1)
Sodium: 132 mmol/L — ABNORMAL LOW (ref 135–145)
Total Bilirubin: 0.5 mg/dL (ref 0.3–1.2)
Total Protein: 8.5 g/dL — ABNORMAL HIGH (ref 6.5–8.1)

## 2020-08-31 MED ORDER — SODIUM CHLORIDE 0.9 % IV SOLN
Freq: Once | INTRAVENOUS | Status: AC
  Filled 2020-08-31: qty 250

## 2020-08-31 MED ORDER — SODIUM CHLORIDE 0.9% FLUSH
10.0000 mL | Freq: Once | INTRAVENOUS | Status: AC
Start: 2020-08-31 — End: 2020-08-31
  Administered 2020-08-31: 10 mL via INTRAVENOUS
  Filled 2020-08-31: qty 10

## 2020-08-31 MED ORDER — HEPARIN SOD (PORK) LOCK FLUSH 100 UNIT/ML IV SOLN
500.0000 [IU] | Freq: Once | INTRAVENOUS | Status: AC
  Administered 2020-08-31: 500 [IU] via INTRAVENOUS
  Filled 2020-08-31: qty 5

## 2020-08-31 NOTE — Telephone Encounter (Signed)
Has he picked up 30 day prescription?

## 2020-08-31 NOTE — Progress Notes (Signed)
Therapeutic phlebotomy performed in [facility] using 20g angiocath. 571ml removed. Pt tolerated well. IV hydration given per orders.

## 2020-08-31 NOTE — Telephone Encounter (Signed)
Patient states that he hasn't picked up the medication yet because was unaware If all were sent. Per [facility], Prilosec, ativan and lactose are all ready. Informed patient meds are ready to be picked. Patient verbalizes understanding

## 2020-08-31 NOTE — Patient Instructions (Signed)

## 2020-09-07 ENCOUNTER — Inpatient Hospital Stay: Payer: 59

## 2020-09-07 ENCOUNTER — Inpatient Hospital Stay: Payer: 59 | Attending: Oncology

## 2020-09-07 ENCOUNTER — Other Ambulatory Visit: Payer: Self-pay

## 2020-09-07 VITALS — BP 102/60 | HR 77 | Temp 97.0°F | Resp 18

## 2020-09-07 DIAGNOSIS — Z79899 Other long term (current) drug therapy: Secondary | ICD-10-CM | POA: Diagnosis not present

## 2020-09-07 DIAGNOSIS — Z5112 Encounter for antineoplastic immunotherapy: Secondary | ICD-10-CM | POA: Diagnosis present

## 2020-09-07 DIAGNOSIS — C220 Liver cell carcinoma: Secondary | ICD-10-CM | POA: Insufficient documentation

## 2020-09-07 LAB — CBC WITH DIFFERENTIAL/PLATELET
Abs Immature Granulocytes: 0.02 10*3/uL (ref 0.00–0.07)
Basophils Absolute: 0.1 10*3/uL (ref 0.0–0.1)
Basophils Relative: 1 %
Eosinophils Absolute: 0.2 10*3/uL (ref 0.0–0.5)
Eosinophils Relative: 2 %
HCT: 38.8 % — ABNORMAL LOW (ref 39.0–52.0)
Hemoglobin: 13 g/dL (ref 13.0–17.0)
Immature Granulocytes: 0 %
Lymphocytes Relative: 20 %
Lymphs Abs: 1.4 10*3/uL (ref 0.7–4.0)
MCH: 34 pg (ref 26.0–34.0)
MCHC: 33.5 g/dL (ref 30.0–36.0)
MCV: 101.6 fL — ABNORMAL HIGH (ref 80.0–100.0)
Monocytes Absolute: 0.6 10*3/uL (ref 0.1–1.0)
Monocytes Relative: 9 %
Neutro Abs: 4.5 10*3/uL (ref 1.7–7.7)
Neutrophils Relative %: 68 %
Platelets: 165 10*3/uL (ref 150–400)
RBC: 3.82 MIL/uL — ABNORMAL LOW (ref 4.22–5.81)
RDW: 13.2 % (ref 11.5–15.5)
WBC: 6.8 10*3/uL (ref 4.0–10.5)
nRBC: 0 % (ref 0.0–0.2)

## 2020-09-07 LAB — COMPREHENSIVE METABOLIC PANEL
ALT: 74 U/L — ABNORMAL HIGH (ref 0–44)
AST: 83 U/L — ABNORMAL HIGH (ref 15–41)
Albumin: 3.2 g/dL — ABNORMAL LOW (ref 3.5–5.0)
Alkaline Phosphatase: 504 U/L — ABNORMAL HIGH (ref 38–126)
Anion gap: 7 (ref 5–15)
BUN: 18 mg/dL (ref 8–23)
CO2: 26 mmol/L (ref 22–32)
Calcium: 9.2 mg/dL (ref 8.9–10.3)
Chloride: 98 mmol/L (ref 98–111)
Creatinine, Ser: 0.61 mg/dL (ref 0.61–1.24)
GFR, Estimated: >60 mL/min (ref >60)
Glucose, Bld: 142 mg/dL — ABNORMAL HIGH (ref 70–99)
Potassium: 4.1 mmol/L (ref 3.5–5.1)
Sodium: 131 mmol/L — ABNORMAL LOW (ref 135–145)
Total Bilirubin: 0.5 mg/dL (ref 0.3–1.2)
Total Protein: 8.4 g/dL — ABNORMAL HIGH (ref 6.5–8.1)

## 2020-09-07 MED ORDER — SODIUM CHLORIDE 0.9 % IV SOLN
Freq: Once | INTRAVENOUS | Status: AC
  Filled 2020-09-07: qty 250

## 2020-09-07 MED ORDER — SODIUM CHLORIDE 0.9% FLUSH
10.0000 mL | Freq: Once | INTRAVENOUS | Status: AC
  Administered 2020-09-07: 10 mL via INTRAVENOUS
  Filled 2020-09-07: qty 10

## 2020-09-07 MED ORDER — HEPARIN SOD (PORK) LOCK FLUSH 100 UNIT/ML IV SOLN
500.0000 [IU] | Freq: Once | INTRAVENOUS | Status: AC
  Administered 2020-09-07: 500 [IU] via INTRAVENOUS
  Filled 2020-09-07: qty 5

## 2020-09-07 MED ORDER — HEPARIN SOD (PORK) LOCK FLUSH 100 UNIT/ML IV SOLN
INTRAVENOUS | Status: AC
  Filled 2020-09-07: qty 5

## 2020-09-07 NOTE — Progress Notes (Signed)
HGB 13, HCT 38.8, last Ferritin 07/03/20 (3.568). Per Dr. Janese Banks proceed with 500cc phlebotomy as scheduled.  5830: Pt tolerated 500cc phlebotomy well. 500cc NS started to be given over 30 minutes.  1012: B/P 82/56, Pt asymptomatic. Per Dr. Janese Banks give an additional 500cc of NS and recheck.  1049: B/P 102/60. Pt stable, no s/s of distress noted. Pt and VS stable at discharge.

## 2020-09-07 NOTE — Patient Instructions (Signed)

## 2020-09-14 ENCOUNTER — Inpatient Hospital Stay (HOSPITAL_BASED_OUTPATIENT_CLINIC_OR_DEPARTMENT_OTHER): Payer: 59 | Admitting: Oncology

## 2020-09-14 ENCOUNTER — Inpatient Hospital Stay: Payer: 59

## 2020-09-14 ENCOUNTER — Encounter: Payer: Self-pay | Admitting: Oncology

## 2020-09-14 VITALS — BP 110/70 | HR 84 | Temp 97.7°F | Resp 18 | Wt 135.0 lb

## 2020-09-14 DIAGNOSIS — C220 Liver cell carcinoma: Secondary | ICD-10-CM | POA: Diagnosis not present

## 2020-09-14 DIAGNOSIS — Z5112 Encounter for antineoplastic immunotherapy: Secondary | ICD-10-CM

## 2020-09-14 LAB — COMPREHENSIVE METABOLIC PANEL
ALT: 61 U/L — ABNORMAL HIGH (ref 0–44)
AST: 63 U/L — ABNORMAL HIGH (ref 15–41)
Albumin: 3.2 g/dL — ABNORMAL LOW (ref 3.5–5.0)
Alkaline Phosphatase: 419 U/L — ABNORMAL HIGH (ref 38–126)
Anion gap: 6 (ref 5–15)
BUN: 15 mg/dL (ref 8–23)
CO2: 27 mmol/L (ref 22–32)
Calcium: 9.2 mg/dL (ref 8.9–10.3)
Chloride: 102 mmol/L (ref 98–111)
Creatinine, Ser: 0.7 mg/dL (ref 0.61–1.24)
GFR, Estimated: >60 mL/min (ref >60)
Glucose, Bld: 99 mg/dL (ref 70–99)
Potassium: 4.2 mmol/L (ref 3.5–5.1)
Sodium: 135 mmol/L (ref 135–145)
Total Bilirubin: 0.5 mg/dL (ref 0.3–1.2)
Total Protein: 7.7 g/dL (ref 6.5–8.1)

## 2020-09-14 LAB — CBC WITH DIFFERENTIAL/PLATELET
Abs Immature Granulocytes: 0.01 10*3/uL (ref 0.00–0.07)
Basophils Absolute: 0 10*3/uL (ref 0.0–0.1)
Basophils Relative: 1 %
Eosinophils Absolute: 0.1 10*3/uL (ref 0.0–0.5)
Eosinophils Relative: 2 %
HCT: 37.3 % — ABNORMAL LOW (ref 39.0–52.0)
Hemoglobin: 12.3 g/dL — ABNORMAL LOW (ref 13.0–17.0)
Immature Granulocytes: 0 %
Lymphocytes Relative: 21 %
Lymphs Abs: 1.5 10*3/uL (ref 0.7–4.0)
MCH: 33.6 pg (ref 26.0–34.0)
MCHC: 33 g/dL (ref 30.0–36.0)
MCV: 101.9 fL — ABNORMAL HIGH (ref 80.0–100.0)
Monocytes Absolute: 0.6 10*3/uL (ref 0.1–1.0)
Monocytes Relative: 8 %
Neutro Abs: 4.9 10*3/uL (ref 1.7–7.7)
Neutrophils Relative %: 68 %
Platelets: 168 10*3/uL (ref 150–400)
RBC: 3.66 MIL/uL — ABNORMAL LOW (ref 4.22–5.81)
RDW: 13.2 % (ref 11.5–15.5)
WBC: 7.2 10*3/uL (ref 4.0–10.5)
nRBC: 0 % (ref 0.0–0.2)

## 2020-09-14 LAB — PROTEIN, URINE, RANDOM: Total Protein, Urine: 75 mg/dL

## 2020-09-14 LAB — FERRITIN: Ferritin: 1738 ng/mL — ABNORMAL HIGH (ref 24–336)

## 2020-09-14 MED ORDER — SODIUM CHLORIDE 0.9 % IV SOLN
900.0000 mg | Freq: Once | INTRAVENOUS | Status: AC
  Administered 2020-09-14: 900 mg via INTRAVENOUS
  Filled 2020-09-14: qty 4

## 2020-09-14 MED ORDER — SODIUM CHLORIDE 0.9 % IV SOLN
Freq: Once | INTRAVENOUS | Status: AC
  Filled 2020-09-14: qty 250

## 2020-09-14 MED ORDER — HEPARIN SOD (PORK) LOCK FLUSH 100 UNIT/ML IV SOLN
500.0000 [IU] | Freq: Once | INTRAVENOUS | Status: AC | PRN
  Administered 2020-09-14: 500 [IU]
  Filled 2020-09-14: qty 5

## 2020-09-14 MED ORDER — HEPARIN SOD (PORK) LOCK FLUSH 100 UNIT/ML IV SOLN
INTRAVENOUS | Status: AC
  Filled 2020-09-14: qty 5

## 2020-09-14 MED ORDER — SODIUM CHLORIDE 0.9 % IV SOLN
1200.0000 mg | Freq: Once | INTRAVENOUS | Status: AC
  Administered 2020-09-14: 1200 mg via INTRAVENOUS
  Filled 2020-09-14: qty 20

## 2020-09-14 NOTE — Progress Notes (Signed)
Pt received tecentriq and avastin infusions in clinic today. Pt also had 500cc of blood removed via therapeutic phlebotomy with 1L of IVF NS infused after. Tolerated well. No complaints at d/c.

## 2020-09-14 NOTE — Progress Notes (Signed)
Patient here for oncology follow-up appointment, expresses complaints of ocassional constipation

## 2020-09-14 NOTE — Progress Notes (Signed)
Hematology/Oncology Consult note [facility]  Telephone:([phone] [phone]  Patient Care Team: Venita Lick, NP as PCP - General (Nurse Practitioner) Clent Jacks, RN as Oncology Nurse Navigator Sindy Guadeloupe, MD as Consulting Physician (Hematology and Oncology)   Name of the patient: Maxwell Edwards  [id number]  April 19, 1955   Date of visit: 09/14/20  Diagnosis- multifocal locally advanced hepatocellular carcinoma Hereditary hemochromatosis  Chief complaint/ Reason for visit-on treatment assessment prior to cycle 7 of a Avastin Tecentriq and for possible phlebotomy  Heme/Onc history:  patient is a 65 year old male with a past medical history significant for hypertension hyperlipidemia, 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  Reports that he has lost about 20 pounds over the last 2 to 3 months. He is a chronic smoker which prompted a CT chest.  CT chest without contrast on 04/06/2020 showed no evidence of mediastinal or hilar adenopathy.  He was found to have 4 subcentimeter lung nodules 3 in the right lobe and one in the left upper lobe which were borderline for metastatic disease.  Cirrhotic liver morphology with 8.3 x 7 cm ill-defined hypoattenuating lesion in the left liver with a 3.3 cm lesion in the dome of the liver near the junction of the right and left hepatic lobes.  Labs showed elevated AST and ALT of 62 and 47 respectively with a normal albumin and normal total bilirubin.  CMP showed elevated H&H of 18.3/53.8.  Hepatitis C and HIV testing was negative. 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  Another mass in the segment 4A measuring 3.4 x 2.9 cm.  No evidence of local  regional adenopathy.   Alpha-fetoprotein elevated at 38,000.  CA 19-9 elevated at 147.Patient's case discussed at tumor board and consensus was that this would be consistent with [facility] especially given the AFP value.  Initially MRI was not characteristic for [facility] but a biopsy was not deemed necessary after AFP results were back   Avastin Tecentriq started in March 2022    Interval history-tremors are better controlled with Ativan presently.  Reports that he is doing well overall.  Appetite and weight Have remained stable.  Denies any pain at this time.  Denies any leg swelling  ECOG PS- 1 Pain scale- 0   Review of systems- Review of Systems  Constitutional:  Positive for malaise/fatigue. Negative for chills, fever and weight loss.  HENT:  Negative for congestion, ear discharge and nosebleeds.   Eyes:  Negative for blurred vision.  Respiratory:  Negative for cough, hemoptysis, sputum production, shortness of breath and wheezing.   Cardiovascular:  Negative for chest pain, palpitations, orthopnea and claudication.  Gastrointestinal:  Negative for abdominal pain, blood in stool, constipation, diarrhea, heartburn, melena, nausea and vomiting.  Genitourinary:  Negative for dysuria, flank pain, frequency, hematuria and urgency.  Musculoskeletal:  Negative for back pain, joint pain and myalgias.  Skin:  Negative for rash.  Neurological:  Negative for dizziness, tingling, focal weakness, seizures, weakness and headaches.  Endo/Heme/Allergies:  Does not bruise/bleed easily.  Psychiatric/Behavioral:  Negative for depression and suicidal ideas. The patient does not have insomnia.       No Known Allergies   Past Medical History:  Diagnosis Date   Barrett esophagus    CAD (coronary artery disease)    Cancer ([place])    liver cancer   Cirrhosis ([place])    Heart disease      Past Surgical History:  Procedure Laterality Date   CORONARY ARTERY BYPASS GRAFT     at 65 years old    ESOPHAGOGASTRODUODENOSCOPY (EGD) WITH PROPOFOL N/A 05/21/2020   Procedure: ESOPHAGOGASTRODUODENOSCOPY (EGD) WITH PROPOFOL;  Surgeon: Lin Landsman, MD;  Location: [place];  Service: Gastroenterology;  Laterality: N/A;   LAPAROSCOPIC LYSIS OF ADHESIONS  06/12/2020   Procedure: LAPAROSCOPIC LYSIS OF ADHESIONS;  Surgeon: Jules Husbands, MD;  Location: [facility];  Service: General;;   PORTA CATH INSERTION N/A 04/30/2020   Procedure: PORTA CATH INSERTION;  Surgeon: Algernon Huxley, MD;  Location: [place] [facility];  Service: Cardiovascular;  Laterality: N/A;   VEIN BYPASS SURGERY      Social History   Socioeconomic History   Marital status: Divorced    Spouse name: Not on file   Number of children: 2   Years of education: Not on file   Highest education level: Not on file  Occupational History   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   Smoking status: Some Days    Packs/day: 0.25    Pack years: 0.00    Types: Cigarettes   Smokeless tobacco: Never  Vaping Use   Vaping Use: Never used  Substance and Sexual Activity   Alcohol use: Not Currently   Drug use: Not Currently   Sexual activity: Not Currently  Other Topics Concern   Not on file  Social History Narrative   Lives by himself    Social Determinants of Health   Financial Resource Strain: Not on file  Food Insecurity: Not on file  Transportation Needs: Not on file  Physical Activity: Not on file  Stress: Not on file  Social Connections: Not on file  Intimate Partner Violence: Not on file    Family History  Problem Relation Age of Onset   Dementia Mother    Ulcers Father    Heart attack Brother    Throat cancer Brother    Heart attack Daughter    Diabetes Daughter    Depression Daughter      Current Outpatient Medications:    folic acid (FOLVITE) 1 MG tablet, TAKE 2 TABLETS BY MOUTH EVERY DAY, Disp: 60 tablet, Rfl: 0   lactulose (CHRONULAC) 10 GM/15ML solution, Take 15 mLs  (10 g total) by mouth daily., Disp: 236 mL, Rfl: 1   lidocaine-prilocaine (EMLA) cream, Apply to affected area once (Patient taking differently: Apply 1 application topically See admin instructions. Apply to affected area once as needed), Disp: 30 g, Rfl: 3   LORazepam (ATIVAN) 0.5 MG tablet, Take 1 tablet (0.5 mg total) by mouth every 8 (eight) hours. For tremors, Disp: 30 tablet, Rfl: 0   mirtazapine (REMERON) 15 MG tablet, Take 1 tablet (15 mg total) by mouth at bedtime., Disp: 30 tablet, Rfl: 2   fluticasone (FLONASE) 50 MCG/ACT nasal spray, Place into both nostrils. (Patient not taking: No sig reported), Disp: , Rfl:    HYDROcodone-acetaminophen (NORCO/VICODIN) 5-325 MG tablet, Take 1 tablet by mouth every 6 (six) hours as needed for moderate pain or severe pain. (Patient not taking: Reported on 09/14/2020), Disp: 60 tablet, Rfl: 0   omeprazole (PRILOSEC) 40 MG capsule, Take 1 capsule by mouth every morning. (Patient not taking: Reported  on 09/14/2020), Disp: , Rfl:    ondansetron (ZOFRAN) 8 MG tablet, Take 1 tablet (8 mg total) by mouth 2 (two) times daily as needed (Nausea or vomiting). (Patient not taking: Reported on 09/14/2020), Disp: 45 tablet, Rfl: 2   prochlorperazine (COMPAZINE) 10 MG tablet, Take 1 tablet (10 mg total) by mouth every 6 (six) hours as needed (Nausea or vomiting). (Patient not taking: Reported on 09/14/2020), Disp: 30 tablet, Rfl: 1 No current facility-administered medications for this visit.  Facility-Administered Medications Ordered in Other Visits:    atezolizumab (TECENTRIQ) 1,200 mg in sodium chloride 0.9 % 250 mL chemo infusion, 1,200 mg, Intravenous, Once, Sindy Guadeloupe, MD, Last Rate: 540 mL/hr at 09/14/20 1009, 1,200 mg at 09/14/20 1009   bevacizumab (AVASTIN) 900 mg in sodium chloride 0.9 % 100 mL chemo infusion, 900 mg, Intravenous, Once, Sindy Guadeloupe, MD   heparin lock flush 100 unit/mL, 500 Units, Intracatheter, Once PRN, Sindy Guadeloupe, MD  Physical exam:   Vitals:   09/14/20 0848  BP: 110/70  Pulse: 84  Resp: 18  Temp: 97.7 F (36.5 C)  TempSrc: Tympanic  SpO2: 100%  Weight: 135 lb (61.2 kg)   Physical Exam Cardiovascular:     Rate and Rhythm: Normal rate and regular rhythm.     Heart sounds: Normal heart sounds.  Pulmonary:     Effort: Pulmonary effort is normal.     Breath sounds: Normal breath sounds.  Abdominal:     General: Bowel sounds are normal.     Palpations: Abdomen is soft.  Skin:    General: Skin is warm and dry.  Neurological:     Mental Status: He is alert and oriented to person, place, and time.     CMP Latest Ref Rng & Units 09/14/2020  Glucose 70 - 99 mg/dL 99  BUN 8 - 23 mg/dL 15  Creatinine 0.61 - 1.24 mg/dL 0.70  Sodium 135 - 145 mmol/L 135  Potassium 3.5 - 5.1 mmol/L 4.2  Chloride 98 - 111 mmol/L 102  CO2 22 - 32 mmol/L 27  Calcium 8.9 - 10.3 mg/dL 9.2  Total Protein 6.5 - 8.1 g/dL 7.7  Total Bilirubin 0.3 - 1.2 mg/dL 0.5  Alkaline Phos 38 - 126 U/L 419(H)  AST 15 - 41 U/L 63(H)  ALT 0 - 44 U/L 61(H)   CBC Latest Ref Rng & Units 09/14/2020  WBC 4.0 - 10.5 K/uL 7.2  Hemoglobin 13.0 - 17.0 g/dL 12.3(L)  Hematocrit 39.0 - 52.0 % 37.3(L)  Platelets 150 - 400 K/uL 168    Assessment and plan- Patient is a 65 y.o. male with locally advanced multifocal hepatocellular carcinoma here for on treatment assessment prior to cycle 7 of Avastin and Tecentriq  Urine protein is 75 today.  I will proceed with a Avastin today and also check a 24-hour urine protein at this time.  It was checked in the past as well and which was less than 2 g.  Okay to proceed with Tecentriq today.  AFP is overall trending down.I will see him back in 3 weeks for cycle 8 of Avastin and Tecentriq.  Repeat CT chest abdomen pelvis with contrast in 2 weeks.  Patient has hereditary hemochromatosis with homozygosity for C282Y.  He is currently getting weekly phlebotomies which we will continue.  Ferritin levels from today are  pending.  Tremors improved after starting as needed Ativan.   Visit Diagnosis 1. Cancer, hepatocellular ([employer])   2. Encounter for monoclonal antibody treatment for  malignancy   3. Encounter for antineoplastic immunotherapy   4. Hereditary hemochromatosis ([place])   5. Iron overload      Dr. Randa Evens, MD, MPH [facility] at [facility] [phone] 09/14/2020 10:24 AM

## 2020-09-14 NOTE — Patient Instructions (Signed)
[facility]    Discharge Instructions: Thank you for choosing [facility] to provide your oncology and hematology care.  If you have a lab appointment with the [facility], please go directly to the [facility] and check in at the registration area.  Wear comfortable clothing and clothing appropriate for easy access to any Portacath or PICC line.   We strive to give you quality time with your provider. You may need to reschedule your appointment if you arrive late (15 or more minutes).  Arriving late affects you and other patients whose appointments are after yours.  Also, if you miss three or more appointments without notifying the office, you may be dismissed from the clinic at the provider's discretion.      For prescription refill requests, have your pharmacy contact our office and allow 72 hours for refills to be completed.    Today you received the following chemotherapy and/or immunotherapy agents - Tecentriq, Avastin      To help prevent nausea and vomiting after your treatment, we encourage you to take your nausea medication as directed.  BELOW ARE SYMPTOMS THAT SHOULD BE REPORTED IMMEDIATELY: *FEVER GREATER THAN 100.4 F (38 C) OR HIGHER *CHILLS OR SWEATING *NAUSEA AND VOMITING THAT IS NOT CONTROLLED WITH YOUR NAUSEA MEDICATION *UNUSUAL SHORTNESS OF BREATH *UNUSUAL BRUISING OR BLEEDING *URINARY PROBLEMS (pain or burning when urinating, or frequent urination) *BOWEL PROBLEMS (unusual diarrhea, constipation, pain near the anus) TENDERNESS IN MOUTH AND THROAT WITH OR WITHOUT PRESENCE OF ULCERS (sore throat, sores in mouth, or a toothache) UNUSUAL RASH, SWELLING OR PAIN  UNUSUAL VAGINAL DISCHARGE OR ITCHING   Items with * indicate a potential emergency and should be followed up as soon as possible or go to the Emergency Department if any problems should occur.  Please show the CHEMOTHERAPY ALERT CARD or IMMUNOTHERAPY ALERT  CARD at check-in to the Emergency Department and triage nurse.  Should you have questions after your visit or need to cancel or reschedule your appointment, please contact [facility]  [phone] and follow the prompts.  Office hours are 8:00 a.m. to 4:30 p.m. Monday - Friday. Please note that voicemails left after 4:00 p.m. may not be returned until the following business day.  We are closed weekends and major holidays. You have access to a nurse at all times for urgent questions. Please call the main number to the clinic [phone] and follow the prompts.  For any non-urgent questions, you may also contact your provider using MyChart. We now offer e-Visits for anyone 81 and older to request care online for non-urgent symptoms. For details visit [email or web address].   Also download the MyChart app! Go to the app store, search "MyChart", open the app, select [facility], and log in with your MyChart username and password.  Due to Covid, a mask is required upon entering the hospital/clinic. If you do not have a mask, one will be given to you upon arrival. For doctor visits, patients may have 1 support person aged 92 or older with them. For treatment visits, patients cannot have anyone with them due to current Covid guidelines and our immunocompromised population.   Atezolizumab injection What is this medication? ATEZOLIZUMAB (a te zoe LIZ ue mab) is a monoclonal antibody. It is used to treat bladder cancer (urothelial cancer), liver cancer, lung cancer, andmelanoma. This medicine may be used for other purposes; ask your health care provider orpharmacist if you have questions. COMMON BRAND  NAME(S): Tecentriq What should I tell my care team before I take this medication? They need to know if you have any of these conditions: autoimmune diseases like Crohn's disease, ulcerative colitis, or lupus have had or planning to have an allogeneic stem cell transplant  (uses someone else's stem cells) history of organ transplant history of radiation to the chest nervous system problems like myasthenia gravis or Guillain-Barre syndrome an unusual or allergic reaction to atezolizumab, other medicines, foods, dyes, or preservatives pregnant or trying to get pregnant breast-feeding How should I use this medication? This medicine is for infusion into a vein. It is given by a health careprofessional in a hospital or clinic setting. A special MedGuide will be given to you before each treatment. Be sure to readthis information carefully each time. Talk to your pediatrician regarding the use of this medicine in children.Special care may be needed. Overdosage: If you think you have taken too much of this medicine contact apoison control center or emergency room at once. NOTE: This medicine is only for you. Do not share this medicine with others. What if I miss a dose? It is important not to miss your dose. Call your doctor or health careprofessional if you are unable to keep an appointment. What may interact with this medication? Interactions have not been studied. This list may not describe all possible interactions. Give your health care provider a list of all the medicines, herbs, non-prescription drugs, or dietary supplements you use. Also tell them if you smoke, drink alcohol, or use illegaldrugs. Some items may interact with your medicine. What should I watch for while using this medication? Your condition will be monitored carefully while you are receiving thismedicine. You may need blood work done while you are taking this medicine. Do not become pregnant while taking this medicine or for at least 5 months after stopping it. Women should inform their doctor if they wish to become pregnant or think they might be pregnant. There is a potential for serious side effects to an unborn child. Talk to your health care professional or pharmacist for more information. Do  not breast-feed an infant while taking this medicineor for at least 5 months after the last dose. What side effects may I notice from receiving this medication? Side effects that you should report to your doctor or health care professionalas soon as possible: allergic reactions like skin rash, itching or hives, swelling of the face, lips, or tongue black, tarry stools bloody or watery diarrhea breathing problems changes in vision chest pain or chest tightness chills facial flushing fever headache signs and symptoms of high blood sugar such as dizziness; dry mouth; dry skin; fruity breath; nausea; stomach pain; increased hunger or thirst; increased urination signs and symptoms of liver injury like dark yellow or brown urine; general ill feeling or flu-like symptoms; light-colored stools; loss of appetite; nausea; right upper belly pain; unusually weak or tired; yellowing of the eyes or skin stomach pain trouble passing urine or change in the amount of urine Side effects that usually do not require medical attention (report to yourdoctor or health care professional if they continue or are bothersome): bone pain cough diarrhea joint pain muscle pain muscle weakness swelling of arms or legs tiredness weight loss This list may not describe all possible side effects. Call your doctor for medical advice about side effects. You may report side effects to FDA at1-800-FDA-1088. Where should I keep my medication? This drug is given in a hospital or clinic and will  not be stored at home. NOTE: This sheet is a summary. It may not cover all possible information. If you have questions about this medicine, talk to your doctor, pharmacist, orhealth care provider.  2022 Elsevier/Gold Standard (2019-11-24 13:59:34)  Bevacizumab injection What is this medication? BEVACIZUMAB (be va SIZ yoo mab) is a monoclonal antibody. It is used to treatmany types of cancer. This medicine may be used for other  purposes; ask your health care provider orpharmacist if you have questions. COMMON BRAND NAME(S): Avastin, MVASI, Noah Charon What should I tell my care team before I take this medication? They need to know if you have any of these conditions: diabetes heart disease high blood pressure history of coughing up blood prior anthracycline chemotherapy (e.g., doxorubicin, daunorubicin, epirubicin) recent or ongoing radiation therapy recent or planning to have surgery stroke an unusual or allergic reaction to bevacizumab, hamster proteins, mouse proteins, other medicines, foods, dyes, or preservatives pregnant or trying to get pregnant breast-feeding How should I use this medication? This medicine is for infusion into a vein. It is given by a health careprofessional in a hospital or clinic setting. Talk to your pediatrician regarding the use of this medicine in children.Special care may be needed. Overdosage: If you think you have taken too much of this medicine contact apoison control center or emergency room at once. NOTE: This medicine is only for you. Do not share this medicine with others. What if I miss a dose? It is important not to miss your dose. Call your doctor or health careprofessional if you are unable to keep an appointment. What may interact with this medication? Interactions are not expected. This list may not describe all possible interactions. Give your health care provider a list of all the medicines, herbs, non-prescription drugs, or dietary supplements you use. Also tell them if you smoke, drink alcohol, or use illegaldrugs. Some items may interact with your medicine. What should I watch for while using this medication? Your condition will be monitored carefully while you are receiving this medicine. You will need important blood work and urine testing done while youare taking this medicine. This medicine may increase your risk to bruise or bleed. Call your doctor orhealth care  professional if you notice any unusual bleeding. Before having surgery, talk to your health care provider to make sure it is ok. This drug can increase the risk of poor healing of your surgical site or wound. You will need to stop this drug for 28 days before surgery. After surgery, wait at least 28 days before restarting this drug. Make sure the surgical site or wound is healed enough before restarting this drug. Talk to your health careprovider if questions. Do not become pregnant while taking this medicine or for 6 months after stopping it. Women should inform their doctor if they wish to become pregnant or think they might be pregnant. There is a potential for serious side effects to an unborn child. Talk to your health care professional or pharmacist for more information. Do not breast-feed an infant while taking this medicine andfor 6 months after the last dose. This medicine has caused ovarian failure in some women. This medicine may interfere with the ability to have a child. You should talk to your doctor orhealth care professional if you are concerned about your fertility. What side effects may I notice from receiving this medication? Side effects that you should report to your doctor or health care professionalas soon as possible: allergic reactions like skin rash, itching or  hives, swelling of the face, lips, or tongue chest pain or chest tightness chills coughing up blood high fever seizures severe constipation signs and symptoms of bleeding such as bloody or black, tarry stools; red or dark-brown urine; spitting up blood or brown material that looks like coffee grounds; red spots on the skin; unusual bruising or bleeding from the eye, gums, or nose signs and symptoms of a blood clot such as breathing problems; chest pain; severe, sudden headache; pain, swelling, warmth in the leg signs and symptoms of a stroke like changes in vision; confusion; trouble speaking or understanding; severe  headaches; sudden numbness or weakness of the face, arm or leg; trouble walking; dizziness; loss of balance or coordination stomach pain sweating swelling of legs or ankles vomiting weight gain Side effects that usually do not require medical attention (report to yourdoctor or health care professional if they continue or are bothersome): back pain changes in taste decreased appetite dry skin nausea tiredness This list may not describe all possible side effects. Call your doctor for medical advice about side effects. You may report side effects to FDA at1-800-FDA-1088. Where should I keep my medication? This drug is given in a hospital or clinic and will not be stored at home. NOTE: This sheet is a summary. It may not cover all possible information. If you have questions about this medicine, talk to your doctor, pharmacist, orhealth care provider.  2022 Elsevier/Gold Standard (2018-12-22 10:50:46)

## 2020-09-15 LAB — AFP TUMOR MARKER: AFP, Serum, Tumor Marker: 109 ng/mL — ABNORMAL HIGH (ref 0.0–8.4)

## 2020-09-16 DIAGNOSIS — Z5112 Encounter for antineoplastic immunotherapy: Secondary | ICD-10-CM | POA: Diagnosis not present

## 2020-09-17 ENCOUNTER — Other Ambulatory Visit: Payer: Self-pay

## 2020-09-17 DIAGNOSIS — Z5112 Encounter for antineoplastic immunotherapy: Secondary | ICD-10-CM

## 2020-09-17 LAB — PROTEIN, URINE, 24 HOUR
Collection Interval-UPROT: 24 hours
Protein, 24H Urine: 336 mg/d — ABNORMAL HIGH (ref 50–100)
Protein, Urine: 21 mg/dL
Urine Total Volume-UPROT: 1600 mL

## 2020-09-19 ENCOUNTER — Other Ambulatory Visit: Payer: Self-pay | Admitting: Oncology

## 2020-09-20 ENCOUNTER — Encounter: Payer: Self-pay | Admitting: Oncology

## 2020-09-21 ENCOUNTER — Inpatient Hospital Stay: Payer: 59

## 2020-09-21 DIAGNOSIS — C220 Liver cell carcinoma: Secondary | ICD-10-CM

## 2020-09-21 DIAGNOSIS — Z5112 Encounter for antineoplastic immunotherapy: Secondary | ICD-10-CM | POA: Diagnosis not present

## 2020-09-21 LAB — CBC WITH DIFFERENTIAL/PLATELET
Abs Immature Granulocytes: 0.03 10*3/uL (ref 0.00–0.07)
Basophils Absolute: 0.1 10*3/uL (ref 0.0–0.1)
Basophils Relative: 1 %
Eosinophils Absolute: 0.1 10*3/uL (ref 0.0–0.5)
Eosinophils Relative: 2 %
HCT: 38.3 % — ABNORMAL LOW (ref 39.0–52.0)
Hemoglobin: 12.6 g/dL — ABNORMAL LOW (ref 13.0–17.0)
Immature Granulocytes: 1 %
Lymphocytes Relative: 24 %
Lymphs Abs: 1.4 10*3/uL (ref 0.7–4.0)
MCH: 34.3 pg — ABNORMAL HIGH (ref 26.0–34.0)
MCHC: 32.9 g/dL (ref 30.0–36.0)
MCV: 104.4 fL — ABNORMAL HIGH (ref 80.0–100.0)
Monocytes Absolute: 0.6 10*3/uL (ref 0.1–1.0)
Monocytes Relative: 11 %
Neutro Abs: 3.5 10*3/uL (ref 1.7–7.7)
Neutrophils Relative %: 61 %
Platelets: 143 10*3/uL — ABNORMAL LOW (ref 150–400)
RBC: 3.67 MIL/uL — ABNORMAL LOW (ref 4.22–5.81)
RDW: 13.2 % (ref 11.5–15.5)
WBC: 5.7 10*3/uL (ref 4.0–10.5)
nRBC: 0 % (ref 0.0–0.2)

## 2020-09-21 LAB — COMPREHENSIVE METABOLIC PANEL
ALT: 72 U/L — ABNORMAL HIGH (ref 0–44)
AST: 83 U/L — ABNORMAL HIGH (ref 15–41)
Albumin: 3.3 g/dL — ABNORMAL LOW (ref 3.5–5.0)
Alkaline Phosphatase: 396 U/L — ABNORMAL HIGH (ref 38–126)
Anion gap: 6 (ref 5–15)
BUN: 15 mg/dL (ref 8–23)
CO2: 27 mmol/L (ref 22–32)
Calcium: 9.1 mg/dL (ref 8.9–10.3)
Chloride: 102 mmol/L (ref 98–111)
Creatinine, Ser: 0.7 mg/dL (ref 0.61–1.24)
GFR, Estimated: >60 mL/min (ref >60)
Glucose, Bld: 101 mg/dL — ABNORMAL HIGH (ref 70–99)
Potassium: 4.4 mmol/L (ref 3.5–5.1)
Sodium: 135 mmol/L (ref 135–145)
Total Bilirubin: 0.5 mg/dL (ref 0.3–1.2)
Total Protein: 7.7 g/dL (ref 6.5–8.1)

## 2020-09-21 MED ORDER — SODIUM CHLORIDE 0.9 % IV SOLN
Freq: Once | INTRAVENOUS | Status: AC
  Filled 2020-09-21: qty 250

## 2020-09-21 NOTE — Patient Instructions (Signed)
[facility]  Discharge Instructions: Thank you for choosing [facility] to provide your oncology and hematology care.  If you have a lab appointment with the [facility], please go directly to the [facility] and check in at the registration area.  Wear comfortable clothing and clothing appropriate for easy access to any Portacath or PICC line.   We strive to give you quality time with your provider. You may need to reschedule your appointment if you arrive late (15 or more minutes).  Arriving late affects you and other patients whose appointments are after yours.  Also, if you miss three or more appointments without notifying the office, you may be dismissed from the clinic at the provider's discretion.      For prescription refill requests, have your pharmacy contact our office and allow 72 hours for refills to be completed.    Today you received the following : phlebotomy   To help prevent nausea and vomiting after your treatment, we encourage you to take your nausea medication as directed.  BELOW ARE SYMPTOMS THAT SHOULD BE REPORTED IMMEDIATELY: *FEVER GREATER THAN 100.4 F (38 C) OR HIGHER *CHILLS OR SWEATING *NAUSEA AND VOMITING THAT IS NOT CONTROLLED WITH YOUR NAUSEA MEDICATION *UNUSUAL SHORTNESS OF BREATH *UNUSUAL BRUISING OR BLEEDING *URINARY PROBLEMS (pain or burning when urinating, or frequent urination) *BOWEL PROBLEMS (unusual diarrhea, constipation, pain near the anus) TENDERNESS IN MOUTH AND THROAT WITH OR WITHOUT PRESENCE OF ULCERS (sore throat, sores in mouth, or a toothache) UNUSUAL RASH, SWELLING OR PAIN  UNUSUAL VAGINAL DISCHARGE OR ITCHING   Items with * indicate a potential emergency and should be followed up as soon as possible or go to the Emergency Department if any problems should occur.  Please show the CHEMOTHERAPY ALERT CARD or IMMUNOTHERAPY ALERT CARD at check-in to the Emergency Department and triage  nurse.  Should you have questions after your visit or need to cancel or reschedule your appointment, please contact [facility]  [phone] and follow the prompts.  Office hours are 8:00 a.m. to 4:30 p.m. Monday - Friday. Please note that voicemails left after 4:00 p.m. may not be returned until the following business day.  We are closed weekends and major holidays. You have access to a nurse at all times for urgent questions. Please call the main number to the clinic [phone] and follow the prompts.  For any non-urgent questions, you may also contact your provider using MyChart. We now offer e-Visits for anyone 91 and older to request care online for non-urgent symptoms. For details visit [email or web address].   Also download the MyChart app! Go to the app store, search "MyChart", open the app, select [facility], and log in with your MyChart username and password.  Due to Covid, a mask is required upon entering the hospital/clinic. If you do not have a mask, one will be given to you upon arrival. For doctor visits, patients may have 1 support person aged 18 or older with them. For treatment visits, patients cannot have anyone with them due to current Covid guidelines and our immunocompromised population.

## 2020-09-21 NOTE — Progress Notes (Unsigned)
Pt e for phleb / hydration. Withdrew 566ml of blood via 20g angio, after complete- replaced with 500 ml NS. Completed without incidence and discharged stable.

## 2020-09-24 ENCOUNTER — Encounter: Payer: Self-pay | Admitting: Oncology

## 2020-09-27 ENCOUNTER — Ambulatory Visit: Admission: RE | Admit: 2020-09-27 | Payer: 59 | Source: Ambulatory Visit

## 2020-09-28 ENCOUNTER — Inpatient Hospital Stay: Payer: 59

## 2020-10-03 ENCOUNTER — Other Ambulatory Visit: Payer: Self-pay

## 2020-10-03 ENCOUNTER — Inpatient Hospital Stay: Payer: 59

## 2020-10-03 ENCOUNTER — Other Ambulatory Visit: Payer: Self-pay | Admitting: Oncology

## 2020-10-03 DIAGNOSIS — Z5112 Encounter for antineoplastic immunotherapy: Secondary | ICD-10-CM | POA: Diagnosis not present

## 2020-10-03 DIAGNOSIS — C220 Liver cell carcinoma: Secondary | ICD-10-CM

## 2020-10-03 LAB — HEMOGLOBIN AND HEMATOCRIT, BLOOD
HCT: 40.9 % (ref 39.0–52.0)
Hemoglobin: 13.4 g/dL (ref 13.0–17.0)

## 2020-10-03 MED ORDER — HEPARIN SOD (PORK) LOCK FLUSH 100 UNIT/ML IV SOLN
500.0000 [IU] | Freq: Once | INTRAVENOUS | Status: AC
  Administered 2020-10-03: 500 [IU] via INTRAVENOUS
  Filled 2020-10-03: qty 5

## 2020-10-03 MED ORDER — SODIUM CHLORIDE 0.9 % IV SOLN
Freq: Once | INTRAVENOUS | Status: AC
  Filled 2020-10-03: qty 250

## 2020-10-03 MED ORDER — SODIUM CHLORIDE 0.9% FLUSH
10.0000 mL | INTRAVENOUS | Status: DC | PRN
  Administered 2020-10-03: 10 mL via INTRAVENOUS
  Filled 2020-10-03: qty 10

## 2020-10-03 NOTE — Patient Instructions (Signed)

## 2020-10-03 NOTE — Progress Notes (Signed)
Hgb 13.4. Proceeded to remove 500 ml blood via PIV #20 g per MD orders. Pt tolerated procedure well. Fluid replacement with 1000 ml via port. Discharged to home feeling well.

## 2020-10-04 ENCOUNTER — Other Ambulatory Visit: Payer: Self-pay | Admitting: *Deleted

## 2020-10-04 DIAGNOSIS — C220 Liver cell carcinoma: Secondary | ICD-10-CM

## 2020-10-05 ENCOUNTER — Inpatient Hospital Stay (HOSPITAL_BASED_OUTPATIENT_CLINIC_OR_DEPARTMENT_OTHER): Payer: 59 | Admitting: Oncology

## 2020-10-05 ENCOUNTER — Inpatient Hospital Stay: Payer: 59

## 2020-10-05 ENCOUNTER — Encounter: Payer: Self-pay | Admitting: Oncology

## 2020-10-05 ENCOUNTER — Other Ambulatory Visit: Payer: Self-pay

## 2020-10-05 ENCOUNTER — Other Ambulatory Visit: Payer: Self-pay | Admitting: *Deleted

## 2020-10-05 DIAGNOSIS — C220 Liver cell carcinoma: Secondary | ICD-10-CM

## 2020-10-05 DIAGNOSIS — Z95828 Presence of other vascular implants and grafts: Secondary | ICD-10-CM

## 2020-10-05 DIAGNOSIS — Z5112 Encounter for antineoplastic immunotherapy: Secondary | ICD-10-CM

## 2020-10-05 LAB — COMPREHENSIVE METABOLIC PANEL
ALT: 63 U/L — ABNORMAL HIGH (ref 0–44)
AST: 65 U/L — ABNORMAL HIGH (ref 15–41)
Albumin: 3.2 g/dL — ABNORMAL LOW (ref 3.5–5.0)
Alkaline Phosphatase: 298 U/L — ABNORMAL HIGH (ref 38–126)
Anion gap: 8 (ref 5–15)
BUN: 12 mg/dL (ref 8–23)
CO2: 24 mmol/L (ref 22–32)
Calcium: 9.1 mg/dL (ref 8.9–10.3)
Chloride: 101 mmol/L (ref 98–111)
Creatinine, Ser: 0.73 mg/dL (ref 0.61–1.24)
GFR, Estimated: >60 mL/min (ref >60)
Glucose, Bld: 143 mg/dL — ABNORMAL HIGH (ref 70–99)
Potassium: 4.1 mmol/L (ref 3.5–5.1)
Sodium: 133 mmol/L — ABNORMAL LOW (ref 135–145)
Total Bilirubin: 0.2 mg/dL — ABNORMAL LOW (ref 0.3–1.2)
Total Protein: 7.3 g/dL (ref 6.5–8.1)

## 2020-10-05 LAB — CBC WITH DIFFERENTIAL/PLATELET
Abs Immature Granulocytes: 0.02 10*3/uL (ref 0.00–0.07)
Basophils Absolute: 0.1 10*3/uL (ref 0.0–0.1)
Basophils Relative: 1 %
Eosinophils Absolute: 0.1 10*3/uL (ref 0.0–0.5)
Eosinophils Relative: 2 %
HCT: 36.3 % — ABNORMAL LOW (ref 39.0–52.0)
Hemoglobin: 11.8 g/dL — ABNORMAL LOW (ref 13.0–17.0)
Immature Granulocytes: 0 %
Lymphocytes Relative: 24 %
Lymphs Abs: 1.5 10*3/uL (ref 0.7–4.0)
MCH: 33.5 pg (ref 26.0–34.0)
MCHC: 32.5 g/dL (ref 30.0–36.0)
MCV: 103.1 fL — ABNORMAL HIGH (ref 80.0–100.0)
Monocytes Absolute: 0.5 10*3/uL (ref 0.1–1.0)
Monocytes Relative: 8 %
Neutro Abs: 4.1 10*3/uL (ref 1.7–7.7)
Neutrophils Relative %: 65 %
Platelets: 134 10*3/uL — ABNORMAL LOW (ref 150–400)
RBC: 3.52 MIL/uL — ABNORMAL LOW (ref 4.22–5.81)
RDW: 12.9 % (ref 11.5–15.5)
WBC: 6.3 10*3/uL (ref 4.0–10.5)
nRBC: 0 % (ref 0.0–0.2)

## 2020-10-05 LAB — PROTEIN, URINE, RANDOM: Total Protein, Urine: 37 mg/dL

## 2020-10-05 MED ORDER — LORAZEPAM 0.5 MG PO TABS: 0.5000 mg | ORAL_TABLET | Freq: Three times a day (TID) | ORAL | 0 refills | Status: DC

## 2020-10-05 MED ORDER — SODIUM CHLORIDE 0.9% FLUSH
10.0000 mL | Freq: Once | INTRAVENOUS | Status: AC
  Administered 2020-10-05: 10 mL via INTRAVENOUS
  Filled 2020-10-05: qty 10

## 2020-10-05 MED ORDER — HEPARIN SOD (PORK) LOCK FLUSH 100 UNIT/ML IV SOLN
500.0000 [IU] | Freq: Once | INTRAVENOUS | Status: DC
  Filled 2020-10-05: qty 5

## 2020-10-05 MED ORDER — SODIUM CHLORIDE 0.9 % IV SOLN
15.0000 mg/kg | Freq: Once | INTRAVENOUS | Status: AC
  Administered 2020-10-05: 900 mg via INTRAVENOUS
  Filled 2020-10-05: qty 32

## 2020-10-05 MED ORDER — SODIUM CHLORIDE 0.9 % IV SOLN
1200.0000 mg | Freq: Once | INTRAVENOUS | Status: AC
  Administered 2020-10-05: 1200 mg via INTRAVENOUS
  Filled 2020-10-05: qty 20

## 2020-10-05 MED ORDER — SODIUM CHLORIDE 0.9 % IV SOLN
Freq: Once | INTRAVENOUS | Status: AC
  Filled 2020-10-05: qty 250

## 2020-10-05 MED ORDER — HEPARIN SOD (PORK) LOCK FLUSH 100 UNIT/ML IV SOLN
INTRAVENOUS | Status: AC
  Filled 2020-10-05: qty 5

## 2020-10-05 MED ORDER — HEPARIN SOD (PORK) LOCK FLUSH 100 UNIT/ML IV SOLN
500.0000 [IU] | Freq: Once | INTRAVENOUS | Status: AC
  Administered 2020-10-05: 500 [IU] via INTRAVENOUS
  Filled 2020-10-05: qty 5

## 2020-10-05 NOTE — Patient Instructions (Signed)
[facility]  Discharge Instructions: Thank you for choosing [facility] to provide your oncology and hematology care.  If you have a lab appointment with the [facility], please go directly to the [facility] and check in at the registration area.  Wear comfortable clothing and clothing appropriate for easy access to any Portacath or PICC line.   We strive to give you quality time with your provider. You may need to reschedule your appointment if you arrive late (15 or more minutes).  Arriving late affects you and other patients whose appointments are after yours.  Also, if you miss three or more appointments without notifying the office, you may be dismissed from the clinic at the provider's discretion.      For prescription refill requests, have your pharmacy contact our office and allow 72 hours for refills to be completed.    Today you received the following chemotherapy and/or immunotherapy agents Therpeutic phlebotomy, avastin , techentirq      To help prevent nausea and vomiting after your treatment, we encourage you to take your nausea medication as directed.  BELOW ARE SYMPTOMS THAT SHOULD BE REPORTED IMMEDIATELY: *FEVER GREATER THAN 100.4 F (38 C) OR HIGHER *CHILLS OR SWEATING *NAUSEA AND VOMITING THAT IS NOT CONTROLLED WITH YOUR NAUSEA MEDICATION *UNUSUAL SHORTNESS OF BREATH *UNUSUAL BRUISING OR BLEEDING *URINARY PROBLEMS (pain or burning when urinating, or frequent urination) *BOWEL PROBLEMS (unusual diarrhea, constipation, pain near the anus) TENDERNESS IN MOUTH AND THROAT WITH OR WITHOUT PRESENCE OF ULCERS (sore throat, sores in mouth, or a toothache) UNUSUAL RASH, SWELLING OR PAIN  UNUSUAL VAGINAL DISCHARGE OR ITCHING   Items with * indicate a potential emergency and should be followed up as soon as possible or go to the Emergency Department if any problems should occur.  Please show the CHEMOTHERAPY ALERT CARD or  IMMUNOTHERAPY ALERT CARD at check-in to the Emergency Department and triage nurse.  Should you have questions after your visit or need to cancel or reschedule your appointment, please contact [facility]  [phone] and follow the prompts.  Office hours are 8:00 a.m. to 4:30 p.m. Monday - Friday. Please note that voicemails left after 4:00 p.m. may not be returned until the following business day.  We are closed weekends and major holidays. You have access to a nurse at all times for urgent questions. Please call the main number to the clinic [phone] and follow the prompts.  For any non-urgent questions, you may also contact your provider using MyChart. We now offer e-Visits for anyone 23 and older to request care online for non-urgent symptoms. For details visit [email or web address].   Also download the MyChart app! Go to the app store, search "MyChart", open the app, select [facility], and log in with your MyChart username and password.  Due to Covid, a mask is required upon entering the hospital/clinic. If you do not have a mask, one will be given to you upon arrival. For doctor visits, patients may have 1 support person aged 79 or older with them. For treatment visits, patients cannot have anyone with them due to current Covid guidelines and our immunocompromised population.   Therapeutic Phlebotomy, Care After This sheet gives you information about how to care for yourself after your procedure. Your health care provider may also give you more specific instructions. If you have problems or questions, contact your health careprovider. What can I expect after the procedure? After the procedure, it is  common to have: Light-headedness or dizziness. You may feel faint. Nausea. Tiredness (fatigue). Follow these instructions at home: Eating and drinking Be sure to eat well-balanced meals for the next 24 hours. Drink enough fluid to keep your urine pale  yellow. Avoid drinking alcohol on the day that you had the procedure. Activity  Return to your normal activities as told by your health care provider. Most people can go back to their normal activities right away. Avoid activities that take a lot of effort for about 5 hours after the procedure. Athletes should avoid strenuous exercise for at least 12 hours. Avoid heavy lifting or pulling for about 5 hours after the procedure. Do not lift anything that is heavier than 10 lb (4.5 kg). Change positions slowly for the remainder of the day. This will help to prevent light-headedness or fainting. If you feel light-headed, lie down until the feeling goes away.  Needle insertion site care  Keep your bandage (dressing) dry. You can remove the bandage after about 5 hours or as told by your health care provider. If you have bleeding from the needle insertion site, raise (elevate) your arm and press firmly on the site until the bleeding stops. If you have bruising at the site, apply ice to the area: Remove the dressing. Put ice in a plastic bag. Place a towel between your skin and the bag. Leave the ice on for 20 minutes, 2-3 times a day for the first 24 hours. If the swelling does not go away after 24 hours, apply a warm, moist cloth (warm compress) to the area for 20 minutes, 2-3 times a day.  General instructions Do not use any products that contain nicotine or tobacco, such as cigarettes and e-cigarettes, for at least 30 minutes after the procedure. Keep all follow-up visits as told by your health care provider. This is important. You may need to continue having regular therapeutic phlebotomy treatments as directed. Contact a health care provider if you: Have redness, swelling, or pain at the needle insertion site. Have fluid or blood coming from the needle insertion site. Have pus or a bad smell coming from the needle insertion site. Notice that the needle insertion site feels warm to the  touch. Feel light-headed, dizzy, or nauseous, and the feeling does not go away. Have new bruising at the needle insertion site. Feel weaker than normal. Have a fever or chills. Get help right away if: You faint. You have chest pain. You have trouble breathing. You have severe nausea or vomiting. Summary After the procedure, it is common to have some light-headedness, dizziness, nausea, or tiredness (fatigue). Be sure to eat well-balanced meals for the next 24 hours. Drink enough fluid to keep your urine pale yellow. Return to your normal activities as told by your health care provider. Keep all follow-up visits as told by your health care provider. You may need to continue having regular therapeutic phlebotomy treatments as directed. This information is not intended to replace advice given to you by your health care provider. Make sure you discuss any questions you have with your healthcare provider. Document Revised: 03/13/2017 Document Reviewed: 03/12/2017 Elsevier Patient Education  [place].  Bevacizumab injection What is this medication? BEVACIZUMAB (be va SIZ yoo mab) is a monoclonal antibody. It is used to treatmany types of cancer. This medicine may be used for other purposes; ask your health care provider orpharmacist if you have questions. COMMON BRAND NAME(S): Avastin, MVASI, Zirabev What should I tell my care team  before I take this medication? They need to know if you have any of these conditions: diabetes heart disease high blood pressure history of coughing up blood prior anthracycline chemotherapy (e.g., doxorubicin, daunorubicin, epirubicin) recent or ongoing radiation therapy recent or planning to have surgery stroke an unusual or allergic reaction to bevacizumab, hamster proteins, mouse proteins, other medicines, foods, dyes, or preservatives pregnant or trying to get pregnant breast-feeding How should I use this medication? This medicine is for  infusion into a vein. It is given by a health careprofessional in a hospital or clinic setting. Talk to your pediatrician regarding the use of this medicine in children.Special care may be needed. Overdosage: If you think you have taken too much of this medicine contact apoison control center or emergency room at once. NOTE: This medicine is only for you. Do not share this medicine with others. What if I miss a dose? It is important not to miss your dose. Call your doctor or health careprofessional if you are unable to keep an appointment. What may interact with this medication? Interactions are not expected. This list may not describe all possible interactions. Give your health care provider a list of all the medicines, herbs, non-prescription drugs, or dietary supplements you use. Also tell them if you smoke, drink alcohol, or use illegaldrugs. Some items may interact with your medicine. What should I watch for while using this medication? Your condition will be monitored carefully while you are receiving this medicine. You will need important blood work and urine testing done while youare taking this medicine. This medicine may increase your risk to bruise or bleed. Call your doctor orhealth care professional if you notice any unusual bleeding. Before having surgery, talk to your health care provider to make sure it is ok. This drug can increase the risk of poor healing of your surgical site or wound. You will need to stop this drug for 28 days before surgery. After surgery, wait at least 28 days before restarting this drug. Make sure the surgical site or wound is healed enough before restarting this drug. Talk to your health careprovider if questions. Do not become pregnant while taking this medicine or for 6 months after stopping it. Women should inform their doctor if they wish to become pregnant or think they might be pregnant. There is a potential for serious side effects to an unborn child.  Talk to your health care professional or pharmacist for more information. Do not breast-feed an infant while taking this medicine andfor 6 months after the last dose. This medicine has caused ovarian failure in some women. This medicine may interfere with the ability to have a child. You should talk to your doctor orhealth care professional if you are concerned about your fertility. What side effects may I notice from receiving this medication? Side effects that you should report to your doctor or health care professionalas soon as possible: allergic reactions like skin rash, itching or hives, swelling of the face, lips, or tongue chest pain or chest tightness chills coughing up blood high fever seizures severe constipation signs and symptoms of bleeding such as bloody or black, tarry stools; red or dark-brown urine; spitting up blood or brown material that looks like coffee grounds; red spots on the skin; unusual bruising or bleeding from the eye, gums, or nose signs and symptoms of a blood clot such as breathing problems; chest pain; severe, sudden headache; pain, swelling, warmth in the leg signs and symptoms of a stroke like changes in  vision; confusion; trouble speaking or understanding; severe headaches; sudden numbness or weakness of the face, arm or leg; trouble walking; dizziness; loss of balance or coordination stomach pain sweating swelling of legs or ankles vomiting weight gain Side effects that usually do not require medical attention (report to yourdoctor or health care professional if they continue or are bothersome): back pain changes in taste decreased appetite dry skin nausea tiredness This list may not describe all possible side effects. Call your doctor for medical advice about side effects. You may report side effects to FDA at1-800-FDA-1088. Where should I keep my medication? This drug is given in a hospital or clinic and will not be stored at home. NOTE: This sheet  is a summary. It may not cover all possible information. If you have questions about this medicine, talk to your doctor, pharmacist, orhealth care provider.  2022 Elsevier/Gold Standard (2018-12-22 10:50:46)   Atezolizumab injection What is this medication? ATEZOLIZUMAB (a te zoe LIZ ue mab) is a monoclonal antibody. It is used to treat bladder cancer (urothelial cancer), liver cancer, lung cancer, andmelanoma. This medicine may be used for other purposes; ask your health care provider orpharmacist if you have questions. COMMON BRAND NAME(S): Tecentriq What should I tell my care team before I take this medication? They need to know if you have any of these conditions: autoimmune diseases like Crohn's disease, ulcerative colitis, or lupus have had or planning to have an allogeneic stem cell transplant (uses someone else's stem cells) history of organ transplant history of radiation to the chest nervous system problems like myasthenia gravis or Guillain-Barre syndrome an unusual or allergic reaction to atezolizumab, other medicines, foods, dyes, or preservatives pregnant or trying to get pregnant breast-feeding How should I use this medication? This medicine is for infusion into a vein. It is given by a health careprofessional in a hospital or clinic setting. A special MedGuide will be given to you before each treatment. Be sure to readthis information carefully each time. Talk to your pediatrician regarding the use of this medicine in children.Special care may be needed. Overdosage: If you think you have taken too much of this medicine contact apoison control center or emergency room at once. NOTE: This medicine is only for you. Do not share this medicine with others. What if I miss a dose? It is important not to miss your dose. Call your doctor or health careprofessional if you are unable to keep an appointment. What may interact with this medication? Interactions have not been  studied. This list may not describe all possible interactions. Give your health care provider a list of all the medicines, herbs, non-prescription drugs, or dietary supplements you use. Also tell them if you smoke, drink alcohol, or use illegaldrugs. Some items may interact with your medicine. What should I watch for while using this medication? Your condition will be monitored carefully while you are receiving thismedicine. You may need blood work done while you are taking this medicine. Do not become pregnant while taking this medicine or for at least 5 months after stopping it. Women should inform their doctor if they wish to become pregnant or think they might be pregnant. There is a potential for serious side effects to an unborn child. Talk to your health care professional or pharmacist for more information. Do not breast-feed an infant while taking this medicineor for at least 5 months after the last dose. What side effects may I notice from receiving this medication? Side effects that you should report  to your doctor or health care professionalas soon as possible: allergic reactions like skin rash, itching or hives, swelling of the face, lips, or tongue black, tarry stools bloody or watery diarrhea breathing problems changes in vision chest pain or chest tightness chills facial flushing fever headache signs and symptoms of high blood sugar such as dizziness; dry mouth; dry skin; fruity breath; nausea; stomach pain; increased hunger or thirst; increased urination signs and symptoms of liver injury like dark yellow or brown urine; general ill feeling or flu-like symptoms; light-colored stools; loss of appetite; nausea; right upper belly pain; unusually weak or tired; yellowing of the eyes or skin stomach pain trouble passing urine or change in the amount of urine Side effects that usually do not require medical attention (report to yourdoctor or health care professional if they continue  or are bothersome): bone pain cough diarrhea joint pain muscle pain muscle weakness swelling of arms or legs tiredness weight loss This list may not describe all possible side effects. Call your doctor for medical advice about side effects. You may report side effects to FDA at1-800-FDA-1088. Where should I keep my medication? This drug is given in a hospital or clinic and will not be stored at home. NOTE: This sheet is a summary. It may not cover all possible information. If you have questions about this medicine, talk to your doctor, pharmacist, orhealth care provider.  2022 Elsevier/Gold Standard (2019-11-24 13:59:34)

## 2020-10-05 NOTE — Progress Notes (Signed)
Hematology/Oncology Consult note [facility]  Telephone:([phone] [phone]  Patient Care Team: Venita Lick, NP as PCP - General (Nurse Practitioner) Clent Jacks, RN as Oncology Nurse Navigator Sindy Guadeloupe, MD as Consulting Physician (Hematology and Oncology)   Name of the patient: Maxwell Edwards  EI:[id number]  05-11-1955   Date of visit: 10/05/20  Diagnosis- multifocal locally advanced hepatocellular carcinoma Hereditary hemochromatosis    Chief complaint/ Reason for visit-on treatment assessment prior to cycle 8 of a Avastin Tecentriq and for ongoing phlebotomy  Heme/Onc history:  patient is a 65 year old male with a past medical history significant for hypertension hyperlipidemia, 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  Reports that he has lost about 20 pounds over the last 2 to 3 months. He is a chronic smoker which prompted a CT chest.  CT chest without contrast on 04/06/2020 showed no evidence of mediastinal or hilar adenopathy.  He was found to have 4 subcentimeter lung nodules 3 in the right lobe and one in the left upper lobe which were borderline for metastatic disease.  Cirrhotic liver morphology with 8.3 x 7 cm ill-defined hypoattenuating lesion in the left liver with a 3.3 cm lesion in the dome of the liver near the junction of the right and left hepatic lobes.  Labs showed elevated AST and ALT of 62 and 47 respectively with a normal albumin and normal total bilirubin.  CMP showed elevated H&H of 18.3/53.8.  Hepatitis C and HIV testing was negative. 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  Another mass in the segment 4A measuring 3.4 x 2.9 cm.  No evidence of local  regional adenopathy.   Alpha-fetoprotein elevated at 38,000.  CA 19-9 elevated at 147.Patient's case discussed at tumor board and consensus was that this would be consistent with [facility] especially given the AFP value.  Initially MRI was not characteristic for [facility] but a biopsy was not deemed necessary after AFP results were back   Avastin Tecentriq started in March 2022     Interval history- patient reports doing well and denies any specific complaints at this time.  He continues to be active and manages ADLs.  Weight remains stable.  Tremors are currently well controlled.  ECOG PS- 1 Pain scale- 0   Review of systems- Review of Systems  Constitutional:  Negative for chills, fever, malaise/fatigue and weight loss.  HENT:  Negative for congestion, ear discharge and nosebleeds.   Eyes:  Negative for blurred vision.  Respiratory:  Negative for cough, hemoptysis, sputum production, shortness of breath and wheezing.   Cardiovascular:  Negative for chest pain, palpitations, orthopnea and claudication.  Gastrointestinal:  Negative for abdominal pain, blood in stool, constipation, diarrhea, heartburn, melena, nausea and vomiting.  Genitourinary:  Negative for dysuria, flank pain, frequency, hematuria and urgency.  Musculoskeletal:  Negative for back pain, joint pain and myalgias.  Skin:  Negative for rash.  Neurological:  Negative for dizziness, tingling, focal weakness, seizures, weakness and headaches.  Endo/Heme/Allergies:  Does not bruise/bleed easily.  Psychiatric/Behavioral:  Negative for depression and suicidal ideas. The patient does not have insomnia.      No Known Allergies   Past Medical History:  Diagnosis Date  Barrett esophagus    CAD (coronary artery disease)    Cancer (HCC)    liver cancer   Cirrhosis ([place])    Heart disease      Past Surgical History:  Procedure Laterality Date   CORONARY ARTERY BYPASS GRAFT     at 65 years old   ESOPHAGOGASTRODUODENOSCOPY (EGD)  WITH PROPOFOL N/A 05/21/2020   Procedure: ESOPHAGOGASTRODUODENOSCOPY (EGD) WITH PROPOFOL;  Surgeon: Lin Landsman, MD;  Location: [place];  Service: Gastroenterology;  Laterality: N/A;   LAPAROSCOPIC LYSIS OF ADHESIONS  06/12/2020   Procedure: LAPAROSCOPIC LYSIS OF ADHESIONS;  Surgeon: Jules Husbands, MD;  Location: [facility];  Service: General;;   PORTA CATH INSERTION N/A 04/30/2020   Procedure: PORTA CATH INSERTION;  Surgeon: Algernon Huxley, MD;  Location: [facility];  Service: Cardiovascular;  Laterality: N/A;   VEIN BYPASS SURGERY      Social History   Socioeconomic History   Marital status: Divorced    Spouse name: Not on file   Number of children: 2   Years of education: Not on file   Highest education level: Not on file  Occupational History   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[employer] Use   Smoking status: Some Days    Packs/day: 0.25    Types: Cigarettes   Smokeless tobacco: Never  Vaping Use   Vaping Use: Never used  Substance and Sexual Activity   Alcohol use: Not Currently   Drug use: Not Currently   Sexual activity: Not Currently  Other Topics Concern   Not on file  Social History Narrative   Lives by himself    Social Determinants of Health   Financial Resource Strain: Not on file  Food Insecurity: Not on file  Transportation Needs: Not on file  Physical Activity: Not on file  Stress: Not on file  Social Connections: Not on file  Intimate Partner Violence: Not on file    Family History  Problem Relation Age of Onset   Dementia Mother    Ulcers Father    Heart attack Brother    Throat cancer Brother    Heart attack Daughter    Diabetes Daughter    Depression Daughter      Current Outpatient Medications:    folic acid (FOLVITE) 1 MG tablet, TAKE 2 TABLETS BY MOUTH EVERY DAY, Disp: 60 tablet, Rfl: 0   lactulose (CHRONULAC) 10 GM/15ML solution, Take 15 mLs (10 g total) by mouth daily., Disp: 236 mL, Rfl: 1    lidocaine-prilocaine (EMLA) cream, Apply to affected area once (Patient taking differently: Apply 1 application topically See admin instructions. Apply to affected area once as needed), Disp: 30 g, Rfl: 3   LORazepam (ATIVAN) 0.5 MG tablet, Take 1 tablet (0.5 mg total) by mouth every 8 (eight) hours. For tremors, Disp: 180 tablet, Rfl: 0   fluticasone (FLONASE) 50 MCG/ACT nasal spray, Place into both nostrils. (Patient not taking: No sig reported), Disp: , Rfl:    HYDROcodone-acetaminophen (NORCO/VICODIN) 5-325 MG tablet, Take 1 tablet by mouth every 6 (six) hours as needed for moderate pain or severe pain. (Patient not taking: No sig reported), Disp: 60 tablet, Rfl: 0   mirtazapine (REMERON) 15 MG tablet, Take 1 tablet (15 mg total) by mouth at bedtime. (Patient not taking: Reported on 10/05/2020), Disp: 30 tablet, Rfl: 2   omeprazole (PRILOSEC) 40 MG capsule, Take 1 capsule by mouth every morning. (Patient not taking: No sig reported), Disp: ,  Rfl:    ondansetron (ZOFRAN) 8 MG tablet, Take 1 tablet (8 mg total) by mouth 2 (two) times daily as needed (Nausea or vomiting). (Patient not taking: No sig reported), Disp: 45 tablet, Rfl: 2   prochlorperazine (COMPAZINE) 10 MG tablet, Take 1 tablet (10 mg total) by mouth every 6 (six) hours as needed (Nausea or vomiting). (Patient not taking: No sig reported), Disp: 30 tablet, Rfl: 1 No current facility-administered medications for this visit.  Facility-Administered Medications Ordered in Other Visits:    atezolizumab (TECENTRIQ) 1,200 mg in sodium chloride 0.9 % 250 mL chemo infusion, 1,200 mg, Intravenous, Once, Sindy Guadeloupe, MD, Last Rate: 540 mL/hr at 10/05/20 1138, 1,200 mg at 10/05/20 1138   bevacizumab (AVASTIN) 900 mg in sodium chloride 0.9 % 100 mL chemo infusion, 15 mg/kg (Order-Specific), Intravenous, Once, Sindy Guadeloupe, MD  Physical exam:  Physical Exam Constitutional:      General: He is not in acute distress. Cardiovascular:     Rate  and Rhythm: Normal rate and regular rhythm.     Heart sounds: Normal heart sounds.  Pulmonary:     Effort: Pulmonary effort is normal.     Breath sounds: Normal breath sounds.  Abdominal:     General: Bowel sounds are normal.     Palpations: Abdomen is soft.  Musculoskeletal:     Right lower leg: No edema.     Left lower leg: No edema.  Skin:    General: Skin is warm and dry.  Neurological:     Mental Status: He is alert and oriented to person, place, and time.     CMP Latest Ref Rng & Units 10/05/2020  Glucose 70 - 99 mg/dL 143(H)  BUN 8 - 23 mg/dL 12  Creatinine 0.61 - 1.24 mg/dL 0.73  Sodium 135 - 145 mmol/L 133(L)  Potassium 3.5 - 5.1 mmol/L 4.1  Chloride 98 - 111 mmol/L 101  CO2 22 - 32 mmol/L 24  Calcium 8.9 - 10.3 mg/dL 9.1  Total Protein 6.5 - 8.1 g/dL 7.3  Total Bilirubin 0.3 - 1.2 mg/dL 0.2(L)  Alkaline Phos 38 - 126 U/L 298(H)  AST 15 - 41 U/L 65(H)  ALT 0 - 44 U/L 63(H)   CBC Latest Ref Rng & Units 10/05/2020  WBC 4.0 - 10.5 K/uL 6.3  Hemoglobin 13.0 - 17.0 g/dL 11.8(L)  Hematocrit 39.0 - 52.0 % 36.3(L)  Platelets 150 - 400 K/uL 134(L)     Assessment and plan- Patient is a 65 y.o. male with locally advanced multifocal hepatocellular carcinoma here for on treatment assessment prior to cycle 8 of a Avastin and Tecentriq  Urine protein is +1 today.  Counts are otherwise okay to proceed with cycle 8 of Avastin and Tecentriq today.  I will see him back in 3 weeks for cycle 9.  He is scheduled for scans prior.  Hereditary hemochromatosis: He is currently getting weekly phlebotomies.  Will check ferritin next week.  Goal ferritin is less than 50.  Overall his LFTs have been trending down and ferritin is coming down after initiating phlebotomy.  Tremors: On as needed Ativan which I will renew today. Visit Diagnosis 1. Encounter for monoclonal antibody treatment for malignancy   2. Encounter for antineoplastic immunotherapy   3. Cancer, hepatocellular ([place])   4.  Hereditary hemochromatosis ([place])      Dr. Randa Evens, MD, MPH [facility] [phone] 10/05/2020 11:40 AM

## 2020-10-05 NOTE — Progress Notes (Signed)
Maxwell Edwards presents today for theraputic phlebotomy per MD orders. Last hgb/hct on  was .VSS prior to procedure. Pt reports eating before arrival. Procedure started at 1102 using patients rt AC. 500 grams of blood removed. Procedure ended at 1122. Gauze and coban applied to [name], site clean and dry. VSS upon completion of procedure. Pt denies dizziness, lightheadedness, or feeling faint. Observed for 30 minutes post procedure. Discharged in satisfactory condition with follow up instructions.

## 2020-10-09 ENCOUNTER — Other Ambulatory Visit: Payer: Self-pay

## 2020-10-09 ENCOUNTER — Ambulatory Visit
Admission: RE | Admit: 2020-10-09 | Discharge: 2020-10-09 | Disposition: A | Discharge status: Home / Self Care | Payer: 59 | Source: Ambulatory Visit | Attending: Oncology | Admitting: Oncology

## 2020-10-09 DIAGNOSIS — C220 Liver cell carcinoma: Secondary | ICD-10-CM | POA: Diagnosis present

## 2020-10-09 MED ORDER — IOHEXOL 350 MG/ML SOLN
75.0000 mL | Freq: Once | INTRAVENOUS | Status: AC | PRN
  Administered 2020-10-09: 75 mL via INTRAVENOUS

## 2020-10-11 ENCOUNTER — Telehealth: Payer: Self-pay | Admitting: *Deleted

## 2020-10-11 ENCOUNTER — Encounter: Payer: Self-pay | Admitting: Oncology

## 2020-10-11 NOTE — Telephone Encounter (Signed)
error 

## 2020-10-12 ENCOUNTER — Inpatient Hospital Stay: Payer: 59

## 2020-10-12 ENCOUNTER — Other Ambulatory Visit: Payer: Self-pay

## 2020-10-12 ENCOUNTER — Inpatient Hospital Stay: Payer: 59 | Attending: Oncology

## 2020-10-12 DIAGNOSIS — Z79899 Other long term (current) drug therapy: Secondary | ICD-10-CM | POA: Insufficient documentation

## 2020-10-12 DIAGNOSIS — C220 Liver cell carcinoma: Secondary | ICD-10-CM | POA: Diagnosis not present

## 2020-10-12 DIAGNOSIS — Z5112 Encounter for antineoplastic immunotherapy: Secondary | ICD-10-CM | POA: Diagnosis present

## 2020-10-12 DIAGNOSIS — C20 Malignant neoplasm of rectum: Secondary | ICD-10-CM | POA: Diagnosis present

## 2020-10-12 LAB — FERRITIN: Ferritin: 1200 ng/mL — ABNORMAL HIGH (ref 24–336)

## 2020-10-12 LAB — HEMOGLOBIN AND HEMATOCRIT, BLOOD
HCT: 37.1 % — ABNORMAL LOW (ref 39.0–52.0)
Hemoglobin: 12.2 g/dL — ABNORMAL LOW (ref 13.0–17.0)

## 2020-10-12 MED ORDER — HEPARIN SOD (PORK) LOCK FLUSH 100 UNIT/ML IV SOLN
500.0000 [IU] | Freq: Once | INTRAVENOUS | Status: AC
Start: 2020-10-12 — End: 2020-10-12
  Administered 2020-10-12: 500 [IU] via INTRAVENOUS
  Filled 2020-10-12: qty 5

## 2020-10-12 MED ORDER — SODIUM CHLORIDE 0.9 % IV SOLN
Freq: Once | INTRAVENOUS | Status: AC
  Filled 2020-10-12: qty 250

## 2020-10-12 MED ORDER — SODIUM CHLORIDE 0.9% FLUSH
10.0000 mL | INTRAVENOUS | Status: DC | PRN
  Administered 2020-10-12: 10 mL via INTRAVENOUS
  Filled 2020-10-12: qty 10

## 2020-10-12 NOTE — Patient Instructions (Signed)

## 2020-10-12 NOTE — Progress Notes (Signed)
Hgb today is 12.2. Performed therapeutic phlebotomy as ordered. Removed 500 ml of Blood. Received IV hydration with treatment. Declined any snacks or beverages. VSS. Denies any complaints. Discharged to home.

## 2020-10-13 LAB — AFP TUMOR MARKER: AFP, Serum, Tumor Marker: 36.5 ng/mL — ABNORMAL HIGH (ref 0.0–8.4)

## 2020-10-18 ENCOUNTER — Other Ambulatory Visit: Payer: Self-pay

## 2020-10-18 ENCOUNTER — Inpatient Hospital Stay: Payer: 59

## 2020-10-18 DIAGNOSIS — Z5112 Encounter for antineoplastic immunotherapy: Secondary | ICD-10-CM | POA: Diagnosis not present

## 2020-10-18 DIAGNOSIS — C220 Liver cell carcinoma: Secondary | ICD-10-CM

## 2020-10-18 LAB — HEMOGLOBIN AND HEMATOCRIT, BLOOD
HCT: 40 % (ref 39.0–52.0)
Hemoglobin: 13 g/dL (ref 13.0–17.0)

## 2020-10-18 MED ORDER — SODIUM CHLORIDE 0.9% FLUSH
10.0000 mL | INTRAVENOUS | Status: DC | PRN
  Administered 2020-10-18: 10 mL via INTRAVENOUS
  Filled 2020-10-18: qty 10

## 2020-10-18 MED ORDER — SODIUM CHLORIDE 0.9 % IV SOLN
Freq: Once | INTRAVENOUS | Status: AC
  Filled 2020-10-18: qty 250

## 2020-10-18 MED ORDER — HEPARIN SOD (PORK) LOCK FLUSH 100 UNIT/ML IV SOLN
500.0000 [IU] | Freq: Once | INTRAVENOUS | Status: AC
Start: 2020-10-18 — End: 2020-10-18
  Administered 2020-10-18: 500 [IU] via INTRAVENOUS
  Filled 2020-10-18: qty 5

## 2020-10-18 NOTE — Progress Notes (Signed)
500 ml phlebotomy performed per MD parameters. Pt tolerated well. Received IVF replacement. VSS. Discharged to home.

## 2020-10-18 NOTE — Patient Instructions (Signed)

## 2020-10-19 ENCOUNTER — Inpatient Hospital Stay: Payer: 59

## 2020-10-21 ENCOUNTER — Other Ambulatory Visit: Payer: Self-pay | Admitting: *Deleted

## 2020-10-21 DIAGNOSIS — C220 Liver cell carcinoma: Secondary | ICD-10-CM

## 2020-10-26 ENCOUNTER — Inpatient Hospital Stay: Payer: 59

## 2020-10-26 ENCOUNTER — Inpatient Hospital Stay (HOSPITAL_BASED_OUTPATIENT_CLINIC_OR_DEPARTMENT_OTHER): Payer: 59 | Admitting: Oncology

## 2020-10-26 ENCOUNTER — Encounter: Payer: Self-pay | Admitting: Oncology

## 2020-10-26 DIAGNOSIS — C220 Liver cell carcinoma: Secondary | ICD-10-CM

## 2020-10-26 DIAGNOSIS — Z5112 Encounter for antineoplastic immunotherapy: Secondary | ICD-10-CM | POA: Diagnosis not present

## 2020-10-26 DIAGNOSIS — Z95828 Presence of other vascular implants and grafts: Secondary | ICD-10-CM

## 2020-10-26 LAB — CBC WITH DIFFERENTIAL/PLATELET
Abs Immature Granulocytes: 0.02 10*3/uL (ref 0.00–0.07)
Basophils Absolute: 0.1 10*3/uL (ref 0.0–0.1)
Basophils Relative: 1 %
Eosinophils Absolute: 0.1 10*3/uL (ref 0.0–0.5)
Eosinophils Relative: 1 %
HCT: 39.3 % (ref 39.0–52.0)
Hemoglobin: 12.8 g/dL — ABNORMAL LOW (ref 13.0–17.0)
Immature Granulocytes: 0 %
Lymphocytes Relative: 26 %
Lymphs Abs: 1.6 10*3/uL (ref 0.7–4.0)
MCH: 34 pg (ref 26.0–34.0)
MCHC: 32.6 g/dL (ref 30.0–36.0)
MCV: 104.2 fL — ABNORMAL HIGH (ref 80.0–100.0)
Monocytes Absolute: 0.6 10*3/uL (ref 0.1–1.0)
Monocytes Relative: 9 %
Neutro Abs: 4 10*3/uL (ref 1.7–7.7)
Neutrophils Relative %: 63 %
Platelets: 128 10*3/uL — ABNORMAL LOW (ref 150–400)
RBC: 3.77 MIL/uL — ABNORMAL LOW (ref 4.22–5.81)
RDW: 12.9 % (ref 11.5–15.5)
WBC: 6.3 10*3/uL (ref 4.0–10.5)
nRBC: 0 % (ref 0.0–0.2)

## 2020-10-26 LAB — COMPREHENSIVE METABOLIC PANEL
ALT: 44 U/L (ref 0–44)
AST: 46 U/L — ABNORMAL HIGH (ref 15–41)
Albumin: 3.6 g/dL (ref 3.5–5.0)
Alkaline Phosphatase: 244 U/L — ABNORMAL HIGH (ref 38–126)
Anion gap: 6 (ref 5–15)
BUN: 20 mg/dL (ref 8–23)
CO2: 26 mmol/L (ref 22–32)
Calcium: 9.1 mg/dL (ref 8.9–10.3)
Chloride: 101 mmol/L (ref 98–111)
Creatinine, Ser: 0.76 mg/dL (ref 0.61–1.24)
GFR, Estimated: >60 mL/min (ref >60)
Glucose, Bld: 108 mg/dL — ABNORMAL HIGH (ref 70–99)
Potassium: 4 mmol/L (ref 3.5–5.1)
Sodium: 133 mmol/L — ABNORMAL LOW (ref 135–145)
Total Bilirubin: 0.5 mg/dL (ref 0.3–1.2)
Total Protein: 7.9 g/dL (ref 6.5–8.1)

## 2020-10-26 LAB — FERRITIN: Ferritin: 1118 ng/mL — ABNORMAL HIGH (ref 24–336)

## 2020-10-26 LAB — PROTEIN, URINE, RANDOM: Total Protein, Urine: 52 mg/dL

## 2020-10-26 LAB — TSH: TSH: 1.613 u[IU]/mL (ref 0.350–4.500)

## 2020-10-26 MED ORDER — SODIUM CHLORIDE 0.9 % IV SOLN
15.0000 mg/kg | Freq: Once | INTRAVENOUS | Status: AC
  Administered 2020-10-26: 900 mg via INTRAVENOUS
  Filled 2020-10-26: qty 32

## 2020-10-26 MED ORDER — SODIUM CHLORIDE 0.9 % IV SOLN
1200.0000 mg | Freq: Once | INTRAVENOUS | Status: AC
  Administered 2020-10-26: 1200 mg via INTRAVENOUS
  Filled 2020-10-26: qty 20

## 2020-10-26 MED ORDER — HEPARIN SOD (PORK) LOCK FLUSH 100 UNIT/ML IV SOLN
500.0000 [IU] | Freq: Once | INTRAVENOUS | Status: AC
  Administered 2020-10-26: 500 [IU] via INTRAVENOUS
  Filled 2020-10-26: qty 5

## 2020-10-26 MED ORDER — HEPARIN SOD (PORK) LOCK FLUSH 100 UNIT/ML IV SOLN
INTRAVENOUS | Status: AC
  Filled 2020-10-26: qty 5

## 2020-10-26 MED ORDER — SODIUM CHLORIDE 0.9 % IV SOLN
Freq: Once | INTRAVENOUS | Status: AC
  Filled 2020-10-26: qty 250

## 2020-10-26 NOTE — Progress Notes (Signed)
I connected with Maxwell Edwards on 10/26/20 at  9:00 AM EDT by video enabled telemedicine visit and verified that I am speaking with the correct person using two identifiers.   I discussed the limitations, risks, security and privacy concerns of performing an evaluation and management service by telemedicine and the availability of in-person appointments. I also discussed with the patient that there may be a patient responsible charge related to this service. The patient expressed understanding and agreed to proceed.  Other persons participating in the visit and their role in the encounter:  none  Patient's location:  cancer center Provider's location:  home  Chief Complaint: On treatment assessment prior to cycle 9 of a Avastin and Tecentriq  Diagnosis: Locally advanced hepatocellular carcinoma Hereditary hemochromatosis  History of present illness:  patient is a 65 year old male with a past medical history significant for hypertension hyperlipidemia, 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  Reports that he has lost about 20 pounds over the last 2 to 3 months. He is a chronic smoker which prompted a CT chest.  CT chest without contrast on 04/06/2020 showed no evidence of mediastinal or hilar adenopathy.  He was found to have 4 subcentimeter lung nodules 3 in the right lobe and one in the left upper lobe which were borderline for metastatic disease.  Cirrhotic liver morphology with 8.3 x 7 cm ill-defined hypoattenuating lesion in the left liver with a 3.3 cm lesion in the dome of the liver near the junction of the right and left hepatic lobes.  Labs showed elevated AST and ALT of 62 and 47 respectively with a normal albumin and normal total bilirubin.  CMP showed elevated H&H of 18.3/53.8.  Hepatitis C and HIV testing was negative. 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  Another mass in the segment 4A measuring 3.4 x 2.9 cm.  No evidence of local regional adenopathy.   Alpha-fetoprotein elevated at 38,000.  CA 19-9 elevated at 147.Patient's case discussed at tumor board and consensus was that this would be consistent with [facility] especially given the AFP value.  Initially MRI was not characteristic for [facility] but a biopsy was not deemed necessary after AFP results were back   Avastin Tecentriq started in March 2022.  He is also getting weekly phlebotomy for iron overload    Interval history tolerating treatment well so far.  Tremors are well controlled.  Energy levels are improving and patient has gained weight.  He is active and up and around mowing his yard and being independent of his ADLs.   Review of Systems  Constitutional:  Positive for malaise/fatigue. Negative for chills, fever and weight loss.  HENT:  Negative for congestion, ear discharge and nosebleeds.   Eyes:  Negative for blurred vision.  Respiratory:  Negative for cough, hemoptysis, sputum production, shortness of breath and wheezing.   Cardiovascular:  Negative for chest pain, palpitations, orthopnea and claudication.  Gastrointestinal:  Negative for abdominal pain, blood in stool, constipation, diarrhea, heartburn, melena, nausea and vomiting.  Genitourinary:  Negative for dysuria, flank pain, frequency, hematuria and urgency.  Musculoskeletal:  Negative for back pain, joint pain and myalgias.  Skin:  Negative for rash.  Neurological:  Negative for dizziness, tingling, focal  weakness, seizures, weakness and headaches.  Endo/Heme/Allergies:  Does not bruise/bleed easily.  Psychiatric/Behavioral:  Negative for depression and suicidal ideas. The patient does not have insomnia.    No Known Allergies  Past Medical History:  Diagnosis Date   Barrett esophagus    CAD  (coronary artery disease)    Cancer (HCC)    liver cancer   Cirrhosis ([place])    Heart disease     Past Surgical History:  Procedure Laterality Date   CORONARY ARTERY BYPASS GRAFT     at 65 years old   ESOPHAGOGASTRODUODENOSCOPY (EGD) WITH PROPOFOL N/A 05/21/2020   Procedure: ESOPHAGOGASTRODUODENOSCOPY (EGD) WITH PROPOFOL;  Surgeon: Lin Landsman, MD;  Location: [facility];  Service: Gastroenterology;  Laterality: N/A;   LAPAROSCOPIC LYSIS OF ADHESIONS  06/12/2020   Procedure: LAPAROSCOPIC LYSIS OF ADHESIONS;  Surgeon: Jules Husbands, MD;  Location: [facility];  Service: General;;   PORTA CATH INSERTION N/A 04/30/2020   Procedure: PORTA CATH INSERTION;  Surgeon: Algernon Huxley, MD;  Location: [facility];  Service: Cardiovascular;  Laterality: N/A;   VEIN BYPASS SURGERY      Social History   Socioeconomic History   Marital status: Divorced    Spouse name: Not on file   Number of children: 2   Years of education: Not on file   Highest education level: Not on file  Occupational History   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[employer] Use   Smoking status: Some Days    Packs/day: 0.25    Types: Cigarettes   Smokeless tobacco: Never  Vaping Use   Vaping Use: Never used  Substance and Sexual Activity   Alcohol use: Not Currently   Drug use: Not Currently   Sexual activity: Not Currently  Other Topics Concern   Not on file  Social History Narrative   Lives by himself    Social Determinants of Health   Financial Resource Strain: Not on file  Food Insecurity: Not on file  Transportation Needs: Not on file  Physical Activity: Not on file  Stress: Not on file  Social Connections: Not on file  Intimate Partner Violence: Not on file    Family History  Problem Relation Age of Onset   Dementia Mother    Ulcers Father    Heart attack Brother    Throat cancer Brother    Heart attack Daughter    Diabetes Daughter    Depression Daughter       Current Outpatient Medications:    folic acid (FOLVITE) 1 MG tablet, TAKE 2 TABLETS BY MOUTH EVERY DAY, Disp: 60 tablet, Rfl: 0   lactulose (CHRONULAC) 10 GM/15ML solution, Take 15 mLs (10 g total) by mouth daily., Disp: 236 mL, Rfl: 1   lidocaine-prilocaine (EMLA) cream, Apply to affected area once (Patient taking differently: Apply 1 application topically See admin instructions. Apply to affected area once as needed), Disp: 30 g, Rfl: 3   LORazepam (ATIVAN) 0.5 MG tablet, Take 1 tablet (0.5 mg total) by mouth every 8 (eight) hours. For tremors, Disp: 180 tablet, Rfl: 0   fluticasone (FLONASE) 50 MCG/ACT nasal spray, Place into both nostrils. (Patient not taking: No sig reported), Disp: , Rfl:    HYDROcodone-acetaminophen (NORCO/VICODIN) 5-325 MG tablet, Take 1 tablet by mouth every 6 (six) hours as needed for moderate pain or severe pain. (Patient not taking: No sig reported), Disp: 60 tablet, Rfl: 0   mirtazapine (REMERON) 15 MG tablet, Take 1  tablet (15 mg total) by mouth at bedtime. (Patient not taking: No sig reported), Disp: 30 tablet, Rfl: 2   omeprazole (PRILOSEC) 40 MG capsule, Take 1 capsule by mouth every morning. (Patient not taking: No sig reported), Disp: , Rfl:    ondansetron (ZOFRAN) 8 MG tablet, Take 1 tablet (8 mg total) by mouth 2 (two) times daily as needed (Nausea or vomiting). (Patient not taking: No sig reported), Disp: 45 tablet, Rfl: 2   prochlorperazine (COMPAZINE) 10 MG tablet, Take 1 tablet (10 mg total) by mouth every 6 (six) hours as needed (Nausea or vomiting). (Patient not taking: No sig reported), Disp: 30 tablet, Rfl: 1  CT CHEST ABDOMEN PELVIS W CONTRAST  Result Date: 10/09/2020 CLINICAL DATA:  Hepatocellular carcinoma, assess treatment response, ongoing chemotherapy EXAM: CT CHEST, ABDOMEN, AND PELVIS WITH CONTRAST TECHNIQUE: Multidetector CT imaging of the chest, abdomen and pelvis was performed following the standard protocol during bolus administration of  intravenous contrast. CONTRAST:  47m OMNIPAQUE IOHEXOL 350 MG/ML SOLN, additional oral enteric contrast COMPARISON:  07/03/2020 FINDINGS: CT CHEST FINDINGS Cardiovascular: Right chest port catheter. Aortic atherosclerosis. Normal heart size. Three-vessel coronary artery calcifications. No pericardial effusion. Mediastinum/Nodes: No enlarged mediastinal, hilar, or axillary lymph nodes. Thyroid gland, trachea, and esophagus demonstrate no significant findings. Lungs/Pleura: Mild centrilobular emphysema. Diffuse bilateral bronchial wall thickening. There are new, small, clustered centrilobular and tree-in-bud nodules of the lateral segment right middle lobe (series 3, image 98). Unchanged 3 mm subpleural nodule of the medial right apex (series 3, image 25). Stable 2 mm nodule of the left pulmonary apex (series 3, image 22). A previously seen new nodule of the dependent left lower lobe is resolved. There is a background of very fine centrilobular nodules throughout the lungs, most concentrated in the apices. No pleural effusion or pneumothorax. Musculoskeletal: No chest wall mass or suspicious bone lesions identified. CT ABDOMEN PELVIS FINDINGS Hepatobiliary: Coarse, nodular cirrhotic morphology of the liver. Interval decrease in size of a large mass of the left lobe of the liver, measuring approximately 8.7 x 6.6 cm, previously 11.5 x 8.1 cm when measured similarly (series 2, image 54). There is redemonstrated tumor thrombus associated with this mass within the left portal vein (series 2, image 57). No significant change of a heterogeneous satellite mass of the medial liver dome measuring 2.9 x 2.8 cm (series 2, image 46). Contracted gallbladder. No gallstones, gallbladder wall thickening, or biliary dilatation. Pancreas: Unremarkable. No pancreatic ductal dilatation or surrounding inflammatory changes. Spleen: Mild splenomegaly, maximum coronal span 13.5 cm. Adrenals/Urinary Tract: Adrenal glands are unremarkable.  Kidneys are normal, without renal calculi, solid lesion, or hydronephrosis. Bladder is unremarkable. Stomach/Bowel: Stomach is within normal limits. Appendix is not clearly visualized. No evidence of bowel wall thickening, distention, or inflammatory changes. Vascular/Lymphatic: Severe mixed aortic atherosclerosis. Unchanged enlarged portacaval and celiac axis lymph nodes, largest portacaval node measuring 1.8 x 1.2 cm (series 2, image 62). Reproductive: Prostatomegaly. Other: No abdominal wall hernia or abnormality. No abdominopelvic ascites. Musculoskeletal: No acute or significant osseous findings. IMPRESSION: 1. Interval decrease in size of a large mass of the left lobe of the liver. There is redemonstrated tumor thrombus associated with this mass within the left portal vein. Findings are consistent with treatment response. 2. No significant change of a heterogeneous satellite mass of the medial liver dome. 3. Unchanged enlarged portacaval and celiac axis lymph nodes, possibly reactive to cirrhosis although suspicious for nodal metastatic disease. Attention on follow-up. 4. Stable small pulmonary nodules, almost certainly benign and  incidental. A previously noted new nodule of the left lower lobe is resolved, consistent with resolved infection or inflammation. 5. There are new, small, clustered centrilobular and tree-in-bud nodules of the lateral segment right middle lobe, infectious or inflammatory. 6. Emphysema. 7. Coronary artery disease. 8. Cirrhosis. Aortic Atherosclerosis (ICD10-I70.0) and Emphysema (ICD10-J43.9). Electronically Signed   By: Eddie Candle M.D.   On: 10/09/2020 15:45    No images are attached to the encounter.   CMP Latest Ref Rng & Units 10/26/2020  Glucose 70 - 99 mg/dL 108(H)  BUN 8 - 23 mg/dL 20  Creatinine 0.61 - 1.24 mg/dL 0.76  Sodium 135 - 145 mmol/L 133(L)  Potassium 3.5 - 5.1 mmol/L 4.0  Chloride 98 - 111 mmol/L 101  CO2 22 - 32 mmol/L 26  Calcium 8.9 - 10.3 mg/dL 9.1   Total Protein 6.5 - 8.1 g/dL 7.9  Total Bilirubin 0.3 - 1.2 mg/dL 0.5  Alkaline Phos 38 - 126 U/L 244(H)  AST 15 - 41 U/L 46(H)  ALT 0 - 44 U/L 44   CBC Latest Ref Rng & Units 10/26/2020  WBC 4.0 - 10.5 K/uL 6.3  Hemoglobin 13.0 - 17.0 g/dL 12.8(L)  Hematocrit 39.0 - 52.0 % 39.3  Platelets 150 - 400 K/uL 128(L)     Observation/objective: Appears in no acute distress over video visit today.  Breathing is nonlabored  Assessment and plan: Patient is a 65 year old male with locally advanced multifocal hepatocellular carcinoma here for on treatment assessment prior to cycle 9 of a Avastin and Tecentriq  Discussed results of CT scan which shows continued decrease in the size of the primary large liver mass fromPrior size of 11.5 x 8.1 cm presently down to 8.7 x 6.6 cm.  He does have tumor thrombus within the left portal vein which does not require any anticoagulation there is another satellite lesion measuring 2.9 x 2.8 cm which is overall stable.  aFP from today is pending but overall AFP has come down significantly from 67,005 months ago to presently 36.5.  Plan is to continue a Avastin and Tecentriq until progression or toxicity.  Urine protein is less than 100.  Continue to monitor.  Blood pressure is stable  Hereditary hemochromatosis: Ferritin levels continue to trend down from greater than 7500-1200 presently.  Goal ferritin is less than 100.  He will therefore continue weekly phlebotomies.  Liver functions have nearly normalized after phlebotomies  Follow-up instructions: I will see him back in 3 weeks for cycle 10 of a Avastin and Tecentriq  I discussed the assessment and treatment plan with the patient. The patient was provided an opportunity to ask questions and all were answered. The patient agreed with the plan and demonstrated an understanding of the instructions.   The patient was advised to call back or seek an in-person evaluation if the symptoms worsen or if the condition fails  to improve as anticipated.  Visit Diagnosis: 1. Hereditary hemochromatosis ([place])   2. Encounter for monoclonal antibody treatment for malignancy   3. Encounter for antineoplastic immunotherapy     Dr. Randa Evens, MD, MPH [facility] at [facility] Tel- [phone] 10/26/2020 7:47 PM

## 2020-10-26 NOTE — Patient Instructions (Signed)
[facility]  Discharge Instructions: Thank you for choosing [facility] to provide your oncology and hematology care.  If you have a lab appointment with the [facility], please go directly to the [facility] and check in at the registration area.  Wear comfortable clothing and clothing appropriate for easy access to any Portacath or PICC line.   We strive to give you quality time with your provider. You may need to reschedule your appointment if you arrive late (15 or more minutes).  Arriving late affects you and other patients whose appointments are after yours.  Also, if you miss three or more appointments without notifying the office, you may be dismissed from the clinic at the provider's discretion.      For prescription refill requests, have your pharmacy contact our office and allow 72 hours for refills to be completed.    Today you received the following chemotherapy and/or immunotherapy agents PHLEBOTOMY, AVASTIN, TECENTIRQ      To help prevent nausea and vomiting after your treatment, we encourage you to take your nausea medication as directed.  BELOW ARE SYMPTOMS THAT SHOULD BE REPORTED IMMEDIATELY: *FEVER GREATER THAN 100.4 F (38 C) OR HIGHER *CHILLS OR SWEATING *NAUSEA AND VOMITING THAT IS NOT CONTROLLED WITH YOUR NAUSEA MEDICATION *UNUSUAL SHORTNESS OF BREATH *UNUSUAL BRUISING OR BLEEDING *URINARY PROBLEMS (pain or burning when urinating, or frequent urination) *BOWEL PROBLEMS (unusual diarrhea, constipation, pain near the anus) TENDERNESS IN MOUTH AND THROAT WITH OR WITHOUT PRESENCE OF ULCERS (sore throat, sores in mouth, or a toothache) UNUSUAL RASH, SWELLING OR PAIN  UNUSUAL VAGINAL DISCHARGE OR ITCHING   Items with * indicate a potential emergency and should be followed up as soon as possible or go to the Emergency Department if any problems should occur.  Please show the CHEMOTHERAPY ALERT CARD or IMMUNOTHERAPY  ALERT CARD at check-in to the Emergency Department and triage nurse.  Should you have questions after your visit or need to cancel or reschedule your appointment, please contact [facility]  [phone] and follow the prompts.  Office hours are 8:00 a.m. to 4:30 p.m. Monday - Friday. Please note that voicemails left after 4:00 p.m. may not be returned until the following business day.  We are closed weekends and major holidays. You have access to a nurse at all times for urgent questions. Please call the main number to the clinic [phone] and follow the prompts.  For any non-urgent questions, you may also contact your provider using MyChart. We now offer e-Visits for anyone 56 and older to request care online for non-urgent symptoms. For details visit [email or web address].   Also download the MyChart app! Go to the app store, search "MyChart", open the app, select [facility], and log in with your MyChart username and password.  Due to Covid, a mask is required upon entering the hospital/clinic. If you do not have a mask, one will be given to you upon arrival. For doctor visits, patients may have 1 support person aged 33 or older with them. For treatment visits, patients cannot have anyone with them due to current Covid guidelines and our immunocompromised population.   Therapeutic Phlebotomy, Care After This sheet gives you information about how to care for yourself after your procedure. Your health care provider may also give you more specific instructions. If you have problems or questions, contact your health careprovider. What can I expect after the procedure? After the procedure, it is common to  have: Light-headedness or dizziness. You may feel faint. Nausea. Tiredness (fatigue). Follow these instructions at home: Eating and drinking Be sure to eat well-balanced meals for the next 24 hours. Drink enough fluid to keep your urine pale yellow. Avoid  drinking alcohol on the day that you had the procedure. Activity  Return to your normal activities as told by your health care provider. Most people can go back to their normal activities right away. Avoid activities that take a lot of effort for about 5 hours after the procedure. Athletes should avoid strenuous exercise for at least 12 hours. Avoid heavy lifting or pulling for about 5 hours after the procedure. Do not lift anything that is heavier than 10 lb (4.5 kg). Change positions slowly for the remainder of the day. This will help to prevent light-headedness or fainting. If you feel light-headed, lie down until the feeling goes away.  Needle insertion site care  Keep your bandage (dressing) dry. You can remove the bandage after about 5 hours or as told by your health care provider. If you have bleeding from the needle insertion site, raise (elevate) your arm and press firmly on the site until the bleeding stops. If you have bruising at the site, apply ice to the area: Remove the dressing. Put ice in a plastic bag. Place a towel between your skin and the bag. Leave the ice on for 20 minutes, 2-3 times a day for the first 24 hours. If the swelling does not go away after 24 hours, apply a warm, moist cloth (warm compress) to the area for 20 minutes, 2-3 times a day.  General instructions Do not use any products that contain nicotine or tobacco, such as cigarettes and e-cigarettes, for at least 30 minutes after the procedure. Keep all follow-up visits as told by your health care provider. This is important. You may need to continue having regular therapeutic phlebotomy treatments as directed. Contact a health care provider if you: Have redness, swelling, or pain at the needle insertion site. Have fluid or blood coming from the needle insertion site. Have pus or a bad smell coming from the needle insertion site. Notice that the needle insertion site feels warm to the touch. Feel  light-headed, dizzy, or nauseous, and the feeling does not go away. Have new bruising at the needle insertion site. Feel weaker than normal. Have a fever or chills. Get help right away if: You faint. You have chest pain. You have trouble breathing. You have severe nausea or vomiting. Summary After the procedure, it is common to have some light-headedness, dizziness, nausea, or tiredness (fatigue). Be sure to eat well-balanced meals for the next 24 hours. Drink enough fluid to keep your urine pale yellow. Return to your normal activities as told by your health care provider. Keep all follow-up visits as told by your health care provider. You may need to continue having regular therapeutic phlebotomy treatments as directed. This information is not intended to replace advice given to you by your health care provider. Make sure you discuss any questions you have with your healthcare provider. Document Revised: 03/13/2017 Document Reviewed: 03/12/2017 Elsevier Patient Education  [place].  Bevacizumab injection What is this medication? BEVACIZUMAB (be va SIZ yoo mab) is a monoclonal antibody. It is used to treatmany types of cancer. This medicine may be used for other purposes; ask your health care provider orpharmacist if you have questions. COMMON BRAND NAME(S): Avastin, MVASI, Zirabev What should I tell my care team before I  take this medication? They need to know if you have any of these conditions: diabetes heart disease high blood pressure history of coughing up blood prior anthracycline chemotherapy (e.g., doxorubicin, daunorubicin, epirubicin) recent or ongoing radiation therapy recent or planning to have surgery stroke an unusual or allergic reaction to bevacizumab, hamster proteins, mouse proteins, other medicines, foods, dyes, or preservatives pregnant or trying to get pregnant breast-feeding How should I use this medication? This medicine is for infusion into a  vein. It is given by a health careprofessional in a hospital or clinic setting. Talk to your pediatrician regarding the use of this medicine in children.Special care may be needed. Overdosage: If you think you have taken too much of this medicine contact apoison control center or emergency room at once. NOTE: This medicine is only for you. Do not share this medicine with others. What if I miss a dose? It is important not to miss your dose. Call your doctor or health careprofessional if you are unable to keep an appointment. What may interact with this medication? Interactions are not expected. This list may not describe all possible interactions. Give your health care provider a list of all the medicines, herbs, non-prescription drugs, or dietary supplements you use. Also tell them if you smoke, drink alcohol, or use illegaldrugs. Some items may interact with your medicine. What should I watch for while using this medication? Your condition will be monitored carefully while you are receiving this medicine. You will need important blood work and urine testing done while youare taking this medicine. This medicine may increase your risk to bruise or bleed. Call your doctor orhealth care professional if you notice any unusual bleeding. Before having surgery, talk to your health care provider to make sure it is ok. This drug can increase the risk of poor healing of your surgical site or wound. You will need to stop this drug for 28 days before surgery. After surgery, wait at least 28 days before restarting this drug. Make sure the surgical site or wound is healed enough before restarting this drug. Talk to your health careprovider if questions. Do not become pregnant while taking this medicine or for 6 months after stopping it. Women should inform their doctor if they wish to become pregnant or think they might be pregnant. There is a potential for serious side effects to an unborn child. Talk to your health  care professional or pharmacist for more information. Do not breast-feed an infant while taking this medicine andfor 6 months after the last dose. This medicine has caused ovarian failure in some women. This medicine may interfere with the ability to have a child. You should talk to your doctor orhealth care professional if you are concerned about your fertility. What side effects may I notice from receiving this medication? Side effects that you should report to your doctor or health care professionalas soon as possible: allergic reactions like skin rash, itching or hives, swelling of the face, lips, or tongue chest pain or chest tightness chills coughing up blood high fever seizures severe constipation signs and symptoms of bleeding such as bloody or black, tarry stools; red or dark-brown urine; spitting up blood or brown material that looks like coffee grounds; red spots on the skin; unusual bruising or bleeding from the eye, gums, or nose signs and symptoms of a blood clot such as breathing problems; chest pain; severe, sudden headache; pain, swelling, warmth in the leg signs and symptoms of a stroke like changes in vision; confusion;  trouble speaking or understanding; severe headaches; sudden numbness or weakness of the face, arm or leg; trouble walking; dizziness; loss of balance or coordination stomach pain sweating swelling of legs or ankles vomiting weight gain Side effects that usually do not require medical attention (report to yourdoctor or health care professional if they continue or are bothersome): back pain changes in taste decreased appetite dry skin nausea tiredness This list may not describe all possible side effects. Call your doctor for medical advice about side effects. You may report side effects to FDA at1-800-FDA-1088. Where should I keep my medication? This drug is given in a hospital or clinic and will not be stored at home. NOTE: This sheet is a summary. It  may not cover all possible information. If you have questions about this medicine, talk to your doctor, pharmacist, orhealth care provider.  2022 Elsevier/Gold Standard (2018-12-22 10:50:46)  Atezolizumab injection What is this medication? ATEZOLIZUMAB (a te zoe LIZ ue mab) is a monoclonal antibody. It is used to treat bladder cancer (urothelial cancer), liver cancer, lung cancer, andmelanoma. This medicine may be used for other purposes; ask your health care provider orpharmacist if you have questions. COMMON BRAND NAME(S): Tecentriq What should I tell my care team before I take this medication? They need to know if you have any of these conditions: autoimmune diseases like Crohn's disease, ulcerative colitis, or lupus have had or planning to have an allogeneic stem cell transplant (uses someone else's stem cells) history of organ transplant history of radiation to the chest nervous system problems like myasthenia gravis or Guillain-Barre syndrome an unusual or allergic reaction to atezolizumab, other medicines, foods, dyes, or preservatives pregnant or trying to get pregnant breast-feeding How should I use this medication? This medicine is for infusion into a vein. It is given by a health careprofessional in a hospital or clinic setting. A special MedGuide will be given to you before each treatment. Be sure to readthis information carefully each time. Talk to your pediatrician regarding the use of this medicine in children.Special care may be needed. Overdosage: If you think you have taken too much of this medicine contact apoison control center or emergency room at once. NOTE: This medicine is only for you. Do not share this medicine with others. What if I miss a dose? It is important not to miss your dose. Call your doctor or health careprofessional if you are unable to keep an appointment. What may interact with this medication? Interactions have not been studied. This list may not  describe all possible interactions. Give your health care provider a list of all the medicines, herbs, non-prescription drugs, or dietary supplements you use. Also tell them if you smoke, drink alcohol, or use illegaldrugs. Some items may interact with your medicine. What should I watch for while using this medication? Your condition will be monitored carefully while you are receiving thismedicine. You may need blood work done while you are taking this medicine. Do not become pregnant while taking this medicine or for at least 5 months after stopping it. Women should inform their doctor if they wish to become pregnant or think they might be pregnant. There is a potential for serious side effects to an unborn child. Talk to your health care professional or pharmacist for more information. Do not breast-feed an infant while taking this medicineor for at least 5 months after the last dose. What side effects may I notice from receiving this medication? Side effects that you should report to your doctor  or health care professionalas soon as possible: allergic reactions like skin rash, itching or hives, swelling of the face, lips, or tongue black, tarry stools bloody or watery diarrhea breathing problems changes in vision chest pain or chest tightness chills facial flushing fever headache signs and symptoms of high blood sugar such as dizziness; dry mouth; dry skin; fruity breath; nausea; stomach pain; increased hunger or thirst; increased urination signs and symptoms of liver injury like dark yellow or brown urine; general ill feeling or flu-like symptoms; light-colored stools; loss of appetite; nausea; right upper belly pain; unusually weak or tired; yellowing of the eyes or skin stomach pain trouble passing urine or change in the amount of urine Side effects that usually do not require medical attention (report to yourdoctor or health care professional if they continue or are bothersome): bone  pain cough diarrhea joint pain muscle pain muscle weakness swelling of arms or legs tiredness weight loss This list may not describe all possible side effects. Call your doctor for medical advice about side effects. You may report side effects to FDA at1-800-FDA-1088. Where should I keep my medication? This drug is given in a hospital or clinic and will not be stored at home. NOTE: This sheet is a summary. It may not cover all possible information. If you have questions about this medicine, talk to your doctor, pharmacist, orhealth care provider.  2022 Elsevier/Gold Standard (2019-11-24 13:59:34)

## 2020-10-26 NOTE — Progress Notes (Signed)
Nutrition Follow-up:  Patient with advanced hepatocellular cancer.  Patient receiving chemotherapy and phlebotomy.    Met with patient during treatment.  Patient reports that his appetite is good.  Breakfast is usually boost and cereal.  Lunch is sandwich and supper is chicken and rice/noodles.  Last night ate barbecue chicken sandwich.   Denies any nutrition impact symptoms.    Reports that he is active around his house    Medications: reviewed  Labs: reviewed  Anthropometrics:   Weight 143 lb 6 oz today, increased  134 lb 3.2 oz on 6/17 138 lb on 5/6 132 lb on 4/8 134 lb on 4/4 145 lb on 3/22   NUTRITION DIAGNOSIS:  Inadequate oral intake improving   INTERVENTION:  Continue oral nutrition supplement 1-2 times per day Encouraged high calorie, high protein foods to continue weight gain    MONITORING, EVALUATION, GOAL: weight trends, intake   NEXT VISIT: ~ 6 weeks during treatment  Maxwell Edwards B. Zenia Resides, [place], [place] Registered Dietitian [phone] (mobile)

## 2020-10-26 NOTE — Progress Notes (Signed)
[name] presents today for theraputic phlebotomy per MD orders. Last hgb/hct on  was .VSS prior to procedure. Pt reports eating before arrival. Procedure started at 1006 using patients right AC. BP 124/84 500 grams of blood removed. Procedure ended at 1018  BP 127/74 . Gauze and coban applied to [facility], site clean and dry. VSS upon completion of procedure. Pt denies dizziness, lightheadedness, or feeling faint. Pt had NSS as ordered.

## 2020-10-27 LAB — AFP TUMOR MARKER: AFP, Serum, Tumor Marker: 26.1 ng/mL — ABNORMAL HIGH (ref 0.0–8.4)

## 2020-10-29 ENCOUNTER — Other Ambulatory Visit: Payer: Self-pay | Admitting: *Deleted

## 2020-10-29 DIAGNOSIS — C220 Liver cell carcinoma: Secondary | ICD-10-CM

## 2020-11-02 ENCOUNTER — Inpatient Hospital Stay: Payer: 59

## 2020-11-02 ENCOUNTER — Other Ambulatory Visit: Payer: Self-pay

## 2020-11-02 ENCOUNTER — Other Ambulatory Visit: Payer: Self-pay | Admitting: *Deleted

## 2020-11-02 DIAGNOSIS — C220 Liver cell carcinoma: Secondary | ICD-10-CM

## 2020-11-02 DIAGNOSIS — Z5112 Encounter for antineoplastic immunotherapy: Secondary | ICD-10-CM | POA: Diagnosis not present

## 2020-11-02 DIAGNOSIS — Z95828 Presence of other vascular implants and grafts: Secondary | ICD-10-CM

## 2020-11-02 LAB — HEMOGLOBIN AND HEMATOCRIT, BLOOD
HCT: 38.6 % — ABNORMAL LOW (ref 39.0–52.0)
Hemoglobin: 12.8 g/dL — ABNORMAL LOW (ref 13.0–17.0)

## 2020-11-02 MED ORDER — SODIUM CHLORIDE 0.9% FLUSH
10.0000 mL | Freq: Once | INTRAVENOUS | Status: DC
  Filled 2020-11-02: qty 10

## 2020-11-02 MED ORDER — SODIUM CHLORIDE 0.9 % IV SOLN
Freq: Once | INTRAVENOUS | Status: AC
Start: 2020-11-02 — End: 2020-11-02
  Filled 2020-11-02: qty 250

## 2020-11-02 MED ORDER — HEPARIN SOD (PORK) LOCK FLUSH 100 UNIT/ML IV SOLN
500.0000 [IU] | Freq: Once | INTRAVENOUS | Status: AC
  Administered 2020-11-02: 500 [IU] via INTRAVENOUS
  Filled 2020-11-02: qty 5

## 2020-11-02 NOTE — Progress Notes (Signed)
Therapeutic phlebotomy performed in right AC using 20g angiocath. 570m removed. 10087mNS infused concurrently. Pt tolerated procedure well. Vital signs stable at discharge.

## 2020-11-09 ENCOUNTER — Inpatient Hospital Stay: Payer: 59

## 2020-11-09 ENCOUNTER — Inpatient Hospital Stay: Payer: 59 | Attending: Oncology

## 2020-11-09 DIAGNOSIS — Z5112 Encounter for antineoplastic immunotherapy: Secondary | ICD-10-CM | POA: Insufficient documentation

## 2020-11-09 DIAGNOSIS — C220 Liver cell carcinoma: Secondary | ICD-10-CM

## 2020-11-09 DIAGNOSIS — Z79899 Other long term (current) drug therapy: Secondary | ICD-10-CM | POA: Diagnosis not present

## 2020-11-09 LAB — HEMOGLOBIN AND HEMATOCRIT, BLOOD
HCT: 38.7 % — ABNORMAL LOW (ref 39.0–52.0)
Hemoglobin: 12.8 g/dL — ABNORMAL LOW (ref 13.0–17.0)

## 2020-11-09 MED ORDER — SODIUM CHLORIDE 0.9 % IV SOLN
Freq: Once | INTRAVENOUS | Status: AC
  Filled 2020-11-09: qty 250

## 2020-11-09 MED ORDER — SODIUM CHLORIDE 0.9% FLUSH
10.0000 mL | INTRAVENOUS | Status: DC | PRN
  Administered 2020-11-09: 10 mL via INTRAVENOUS
  Filled 2020-11-09: qty 10

## 2020-11-09 MED ORDER — HEPARIN SOD (PORK) LOCK FLUSH 100 UNIT/ML IV SOLN
500.0000 [IU] | Freq: Once | INTRAVENOUS | Status: AC
  Administered 2020-11-09: 500 [IU] via INTRAVENOUS
  Filled 2020-11-09: qty 5

## 2020-11-09 NOTE — Progress Notes (Signed)
Removed 500 ml blood via PIV right AC per Phlebotomy parameters. Replaced 1 liter of NS vis port. VSS. Tolerated well. Discharged to home.

## 2020-11-15 ENCOUNTER — Other Ambulatory Visit: Payer: Self-pay | Admitting: Oncology

## 2020-11-16 ENCOUNTER — Other Ambulatory Visit: Payer: Self-pay

## 2020-11-16 ENCOUNTER — Inpatient Hospital Stay: Payer: 59

## 2020-11-16 ENCOUNTER — Inpatient Hospital Stay (HOSPITAL_BASED_OUTPATIENT_CLINIC_OR_DEPARTMENT_OTHER): Payer: 59 | Admitting: Oncology

## 2020-11-16 ENCOUNTER — Encounter: Payer: Self-pay | Admitting: Oncology

## 2020-11-16 VITALS — BP 132/84 | HR 80 | Temp 97.3°F | Resp 16 | Ht 69.0 in | Wt 147.2 lb

## 2020-11-16 VITALS — BP 127/73 | HR 76 | Resp 17

## 2020-11-16 DIAGNOSIS — R7989 Other specified abnormal findings of blood chemistry: Secondary | ICD-10-CM

## 2020-11-16 DIAGNOSIS — R945 Abnormal results of liver function studies: Secondary | ICD-10-CM

## 2020-11-16 DIAGNOSIS — Z5112 Encounter for antineoplastic immunotherapy: Secondary | ICD-10-CM

## 2020-11-16 DIAGNOSIS — C220 Liver cell carcinoma: Secondary | ICD-10-CM

## 2020-11-16 LAB — COMPREHENSIVE METABOLIC PANEL
ALT: 53 U/L — ABNORMAL HIGH (ref 0–44)
AST: 52 U/L — ABNORMAL HIGH (ref 15–41)
Albumin: 3.7 g/dL (ref 3.5–5.0)
Alkaline Phosphatase: 281 U/L — ABNORMAL HIGH (ref 38–126)
Anion gap: 8 (ref 5–15)
BUN: 14 mg/dL (ref 8–23)
CO2: 26 mmol/L (ref 22–32)
Calcium: 9.3 mg/dL (ref 8.9–10.3)
Chloride: 101 mmol/L (ref 98–111)
Creatinine, Ser: 0.78 mg/dL (ref 0.61–1.24)
GFR, Estimated: >60 mL/min (ref >60)
Glucose, Bld: 94 mg/dL (ref 70–99)
Potassium: 4.1 mmol/L (ref 3.5–5.1)
Sodium: 135 mmol/L (ref 135–145)
Total Bilirubin: 0.5 mg/dL (ref 0.3–1.2)
Total Protein: 8.4 g/dL — ABNORMAL HIGH (ref 6.5–8.1)

## 2020-11-16 LAB — PROTEIN, URINE, RANDOM: Total Protein, Urine: 64 mg/dL

## 2020-11-16 LAB — CBC WITH DIFFERENTIAL/PLATELET
Abs Immature Granulocytes: 0.02 10*3/uL (ref 0.00–0.07)
Basophils Absolute: 0 10*3/uL (ref 0.0–0.1)
Basophils Relative: 1 %
Eosinophils Absolute: 0.1 10*3/uL (ref 0.0–0.5)
Eosinophils Relative: 2 %
HCT: 42.9 % (ref 39.0–52.0)
Hemoglobin: 14.1 g/dL (ref 13.0–17.0)
Immature Granulocytes: 0 %
Lymphocytes Relative: 21 %
Lymphs Abs: 1.2 10*3/uL (ref 0.7–4.0)
MCH: 34.3 pg — ABNORMAL HIGH (ref 26.0–34.0)
MCHC: 32.9 g/dL (ref 30.0–36.0)
MCV: 104.4 fL — ABNORMAL HIGH (ref 80.0–100.0)
Monocytes Absolute: 0.5 10*3/uL (ref 0.1–1.0)
Monocytes Relative: 9 %
Neutro Abs: 3.9 10*3/uL (ref 1.7–7.7)
Neutrophils Relative %: 67 %
Platelets: 136 10*3/uL — ABNORMAL LOW (ref 150–400)
RBC: 4.11 MIL/uL — ABNORMAL LOW (ref 4.22–5.81)
RDW: 12.7 % (ref 11.5–15.5)
WBC: 5.8 10*3/uL (ref 4.0–10.5)
nRBC: 0 % (ref 0.0–0.2)

## 2020-11-16 LAB — VITAMIN B12: Vitamin B-12: 246 pg/mL (ref 180–914)

## 2020-11-16 LAB — FOLATE: Folate: 48.4 ng/mL (ref >5.9)

## 2020-11-16 MED ORDER — ATEZOLIZUMAB CHEMO INJECTION 1200 MG/20ML
1200.0000 mg | Freq: Once | INTRAVENOUS | Status: AC
Start: 2020-11-16 — End: 2020-11-16
  Administered 2020-11-16: 1200 mg via INTRAVENOUS
  Filled 2020-11-16: qty 20

## 2020-11-16 MED ORDER — SODIUM CHLORIDE 0.9 % IV SOLN
Freq: Once | INTRAVENOUS | Status: AC
Start: 2020-11-16 — End: 2020-11-16
  Filled 2020-11-16: qty 250

## 2020-11-16 MED ORDER — SODIUM CHLORIDE 0.9 % IV SOLN
Freq: Once | INTRAVENOUS | Status: AC
  Filled 2020-11-16: qty 250

## 2020-11-16 MED ORDER — HEPARIN SOD (PORK) LOCK FLUSH 100 UNIT/ML IV SOLN
500.0000 [IU] | Freq: Once | INTRAVENOUS | Status: AC
  Filled 2020-11-16: qty 5

## 2020-11-16 MED ORDER — SODIUM CHLORIDE 0.9% FLUSH
10.0000 mL | INTRAVENOUS | Status: DC | PRN
  Administered 2020-11-16: 10 mL via INTRAVENOUS
  Filled 2020-11-16: qty 10

## 2020-11-16 MED ORDER — HEPARIN SOD (PORK) LOCK FLUSH 100 UNIT/ML IV SOLN
INTRAVENOUS | Status: AC
  Administered 2020-11-16: 500 [IU] via INTRAVENOUS
  Filled 2020-11-16: qty 5

## 2020-11-16 MED ORDER — SODIUM CHLORIDE 0.9 % IV SOLN
15.0000 mg/kg | Freq: Once | INTRAVENOUS | Status: AC
  Administered 2020-11-16: 900 mg via INTRAVENOUS
  Filled 2020-11-16: qty 32

## 2020-11-16 NOTE — Patient Instructions (Signed)
[facility] ONCOL[facility]  Discharge Instructions: Thank you for choosing [facility] to provide your oncology and hematology care.  If you have a lab appointment with the [facility], please go directly to the [facility] and check in at the registration area.  Wear comfortable clothing and clothing appropriate for easy access to any Portacath or PICC line.   We strive to give you quality time with your provider. You may need to reschedule your appointment if you arrive late (15 or more minutes).  Arriving late affects you and other patients whose appointments are after yours.  Also, if you miss three or more appointments without notifying the office, you may be dismissed from the clinic at the provider's discretion.      For prescription refill requests, have your pharmacy contact our office and allow 72 hours for refills to be completed.    Today you received the following chemotherapy and/or immunotherapy agents Avastin and Tecentriq       To help prevent nausea and vomiting after your treatment, we encourage you to take your nausea medication as directed.  BELOW ARE SYMPTOMS THAT SHOULD BE REPORTED IMMEDIATELY: *FEVER GREATER THAN 100.4 F (38 C) OR HIGHER *CHILLS OR SWEATING *NAUSEA AND VOMITING THAT IS NOT CONTROLLED WITH YOUR NAUSEA MEDICATION *UNUSUAL SHORTNESS OF BREATH *UNUSUAL BRUISING OR BLEEDING *URINARY PROBLEMS (pain or burning when urinating, or frequent urination) *BOWEL PROBLEMS (unusual diarrhea, constipation, pain near the anus) TENDERNESS IN MOUTH AND THROAT WITH OR WITHOUT PRESENCE OF ULCERS (sore throat, sores in mouth, or a toothache) UNUSUAL RASH, SWELLING OR PAIN  UNUSUAL VAGINAL DISCHARGE OR ITCHING   Items with * indicate a potential emergency and should be followed up as soon as possible or go to the Emergency Department if any problems should occur.  Please show the CHEMOTHERAPY ALERT CARD or IMMUNOTHERAPY ALERT CARD  at check-in to the Emergency Department and triage nurse.  Should you have questions after your visit or need to cancel or reschedule your appointment, please contact [facility]  [phone] and follow the prompts.  Office hours are 8:00 a.m. to 4:30 p.m. Monday - Friday. Please note that voicemails left after 4:00 p.m. may not be returned until the following business day.  We are closed weekends and major holidays. You have access to a nurse at all times for urgent questions. Please call the main number to the clinic [phone] and follow the prompts.  For any non-urgent questions, you may also contact your provider using MyChart. We now offer e-Visits for anyone 42 and older to request care online for non-urgent symptoms. For details visit [email or web address].   Also download the MyChart app! Go to the app store, search "MyChart", open the app, select [facility], and log in with your MyChart username and password.  Due to Covid, a mask is required upon entering the hospital/clinic. If you do not have a mask, one will be given to you upon arrival. For doctor visits, patients may have 1 support person aged 56 or older with them. For treatment visits, patients cannot have anyone with them due to current Covid guidelines and our immunocompromised population.   Therapeutic Phlebotomy, Care After This sheet gives you information about how to care for yourself after your procedure. Your health care provider may also give you more specific instructions. If you have problems or questions, contact your health care provider. What can I expect after the procedure? After the procedure, it is  common to have: Light-headedness or dizziness. You may feel faint. Nausea. Tiredness (fatigue). Follow these instructions at home: Eating and drinking Be sure to eat well-balanced meals for the next 24 hours. Drink enough fluid to keep your urine pale yellow. Avoid drinking  alcohol on the day that you had the procedure. Activity  Return to your normal activities as told by your health care provider. Most people can go back to their normal activities right away. Avoid activities that take a lot of effort for about 5 hours after the procedure. Athletes should avoid strenuous exercise for at least 12 hours. Avoid heavy lifting or pulling for about 5 hours after the procedure. Do not lift anything that is heavier than 10 lb (4.5 kg). Change positions slowly for the remainder of the day. This will help to prevent light-headedness or fainting. If you feel light-headed, lie down until the feeling goes away. Needle insertion site care  Keep your bandage (dressing) dry. You can remove the bandage after about 5 hours or as told by your health care provider. If you have bleeding from the needle insertion site, raise (elevate) your arm and press firmly on the site until the bleeding stops. If you have bruising at the site, apply ice to the area: Remove the dressing. Put ice in a plastic bag. Place a towel between your skin and the bag. Leave the ice on for 20 minutes, 2-3 times a day for the first 24 hours. If the swelling does not go away after 24 hours, apply a warm, moist cloth (warm compress) to the area for 20 minutes, 2-3 times a day. General instructions Do not use any products that contain nicotine or tobacco, such as cigarettes and e-cigarettes, for at least 30 minutes after the procedure. Keep all follow-up visits as told by your health care provider. This is important. You may need to continue having regular therapeutic phlebotomy treatments as directed. Contact a health care provider if you: Have redness, swelling, or pain at the needle insertion site. Have fluid or blood coming from the needle insertion site. Have pus or a bad smell coming from the needle insertion site. Notice that the needle insertion site feels warm to the touch. Feel light-headed, dizzy,  or nauseous, and the feeling does not go away. Have new bruising at the needle insertion site. Feel weaker than normal. Have a fever or chills. Get help right away if: You faint. You have chest pain. You have trouble breathing. You have severe nausea or vomiting. Summary After the procedure, it is common to have some light-headedness, dizziness, nausea, or tiredness (fatigue). Be sure to eat well-balanced meals for the next 24 hours. Drink enough fluid to keep your urine pale yellow. Return to your normal activities as told by your health care provider. Keep all follow-up visits as told by your health care provider. You may need to continue having regular therapeutic phlebotomy treatments as directed. This information is not intended to replace advice given to you by your health care provider. Make sure you discuss any questions you have with your health care provider. Document Revised: 06/05/2020 Document Reviewed: 03/12/2017 Elsevier Patient Education  2022 [employer].

## 2020-11-16 NOTE — Progress Notes (Signed)
Hematology/Oncology Consult note [facility]  Telephone:([phone] [phone]  Patient Care Team: Venita Lick, NP as PCP - General (Nurse Practitioner) Clent Jacks, RN as Oncology Nurse Navigator Sindy Guadeloupe, MD as Consulting Physician (Hematology and Oncology)   Name of the patient: Maxwell Edwards  EI:[id number]  1956-02-21   Date of visit: 11/16/20  Diagnosis-locally advanced unresectable hepatocellular carcinoma  Chief complaint/ Reason for visit-on treatment assessment prior to cycle 10 of a Avastin and Tecentriq  Heme/Onc history: patient is a 65 year old male with a past medical history significant for hypertension hyperlipidemia, 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  Reports that he has lost about 20 pounds over the last 2 to 3 months. He is a chronic smoker which prompted a CT chest.  CT chest without contrast on 04/06/2020 showed no evidence of mediastinal or hilar adenopathy.  He was found to have 4 subcentimeter lung nodules 3 in the right lobe and one in the left upper lobe which were borderline for metastatic disease.  Cirrhotic liver morphology with 8.3 x 7 cm ill-defined hypoattenuating lesion in the left liver with a 3.3 cm lesion in the dome of the liver near the junction of the right and left hepatic lobes.  Labs showed elevated AST and ALT of 62 and 47 respectively with a normal albumin and normal total bilirubin.  CMP showed elevated H&H of 18.3/53.8.  Hepatitis C and HIV testing was negative. 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  Another mass in the segment 4A measuring 3.4 x 2.9 cm.  No evidence of local regional adenopathy.   Alpha-fetoprotein elevated at  38,000.  CA 19-9 elevated at 147.Patient's case discussed at tumor board and consensus was that this would be consistent with [facility] especially given the AFP value.  Initially MRI was not characteristic for [facility] but a biopsy was not deemed necessary after AFP results were back   Avastin Tecentriq started in March 2022.  He is also getting weekly phlebotomy for iron overload    Interval history-patient reports doing well and other than mild fatigue and stable tremors patient does not report any new complaints at this time  ECOG PS- 1 Pain scale- 0  Review of systems- Review of Systems  Constitutional:  Negative for chills, fever, malaise/fatigue and weight loss.  HENT:  Negative for congestion, ear discharge and nosebleeds.   Eyes:  Negative for blurred vision.  Respiratory:  Negative for cough, hemoptysis, sputum production, shortness of breath and wheezing.   Cardiovascular:  Negative for chest pain, palpitations, orthopnea and claudication.  Gastrointestinal:  Negative for abdominal pain, blood in stool, constipation, diarrhea, heartburn, melena, nausea and vomiting.  Genitourinary:  Negative for dysuria, flank pain, frequency, hematuria and urgency.  Musculoskeletal:  Negative for back pain, joint pain and myalgias.  Skin:  Negative for rash.  Neurological:  Negative for dizziness, tingling, focal weakness, seizures, weakness and headaches.  Endo/Heme/Allergies:  Does not bruise/bleed easily.  Psychiatric/Behavioral:  Negative for depression and suicidal ideas. The patient does not have insomnia.       No Known Allergies   Past Medical History:  Diagnosis Date   Barrett esophagus    CAD (coronary artery  disease)    Cancer ([place])    liver cancer   Cirrhosis ([place])    Heart disease      Past Surgical History:  Procedure Laterality Date   CORONARY ARTERY BYPASS GRAFT     at 65 years old   ESOPHAGOGASTRODUODENOSCOPY (EGD) WITH PROPOFOL N/A 05/21/2020   Procedure:  ESOPHAGOGASTRODUODENOSCOPY (EGD) WITH PROPOFOL;  Surgeon: Lin Landsman, MD;  Location: [facility];  Service: Gastroenterology;  Laterality: N/A;   LAPAROSCOPIC LYSIS OF ADHESIONS  06/12/2020   Procedure: LAPAROSCOPIC LYSIS OF ADHESIONS;  Surgeon: Jules Husbands, MD;  Location: [facility];  Service: General;;   PORTA CATH INSERTION N/A 04/30/2020   Procedure: PORTA CATH INSERTION;  Surgeon: Algernon Huxley, MD;  Location: [facility];  Service: Cardiovascular;  Laterality: N/A;   VEIN BYPASS SURGERY      Social History   Socioeconomic History   Marital status: Divorced    Spouse name: Not on file   Number of children: 2   Years of education: Not on file   Highest education level: Not on file  Occupational History   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[employer] Use   Smoking status: Some Days    Packs/day: 0.25    Types: Cigarettes   Smokeless tobacco: Never  Vaping Use   Vaping Use: Never used  Substance and Sexual Activity   Alcohol use: Not Currently   Drug use: Not Currently   Sexual activity: Not Currently  Other Topics Concern   Not on file  Social History Narrative   Lives by himself    Social Determinants of Health   Financial Resource Strain: Not on file  Food Insecurity: Not on file  Transportation Needs: Not on file  Physical Activity: Not on file  Stress: Not on file  Social Connections: Not on file  Intimate Partner Violence: Not on file    Family History  Problem Relation Age of Onset   Dementia Mother    Ulcers Father    Heart attack Brother    Throat cancer Brother    Heart attack Daughter    Diabetes Daughter    Depression Daughter      Current Outpatient Medications:    fluticasone (FLONASE) 50 MCG/ACT nasal spray, Place into both nostrils. (Patient not taking: No sig reported), Disp: , Rfl:    folic acid (FOLVITE) 1 MG tablet, Take 2 tablets (2 mg total) by mouth daily., Disp: 60 tablet, Rfl: 4    HYDROcodone-acetaminophen (NORCO/VICODIN) 5-325 MG tablet, Take 1 tablet by mouth every 6 (six) hours as needed for moderate pain or severe pain. (Patient not taking: No sig reported), Disp: 60 tablet, Rfl: 0   lactulose (CHRONULAC) 10 GM/15ML solution, Take 15 mLs (10 g total) by mouth daily., Disp: 236 mL, Rfl: 1   lidocaine-prilocaine (EMLA) cream, Apply to affected area once (Patient taking differently: Apply 1 application topically See admin instructions. Apply to affected area once as needed), Disp: 30 g, Rfl: 3   LORazepam (ATIVAN) 0.5 MG tablet, Take 1 tablet (0.5 mg total) by mouth every 8 (eight) hours. For tremors, Disp: 180 tablet, Rfl: 0   mirtazapine (REMERON) 15 MG tablet, Take 1 tablet (15 mg total) by mouth at bedtime. (Patient not taking: No sig reported), Disp: 30 tablet, Rfl: 2   omeprazole (PRILOSEC) 40 MG capsule, Take 1 capsule by mouth every morning. (Patient not taking: No sig reported), Disp: , Rfl:    ondansetron (ZOFRAN)  8 MG tablet, Take 1 tablet (8 mg total) by mouth 2 (two) times daily as needed (Nausea or vomiting). (Patient not taking: No sig reported), Disp: 45 tablet, Rfl: 2   prochlorperazine (COMPAZINE) 10 MG tablet, Take 1 tablet (10 mg total) by mouth every 6 (six) hours as needed (Nausea or vomiting). (Patient not taking: No sig reported), Disp: 30 tablet, Rfl: 1 No current facility-administered medications for this visit.  Facility-Administered Medications Ordered in Other Visits:    heparin lock flush 100 unit/mL, 500 Units, Intravenous, Once, Sindy Guadeloupe, MD   sodium chloride flush (NS) 0.9 % injection 10 mL, 10 mL, Intravenous, PRN, Sindy Guadeloupe, MD, 10 mL at 11/16/20 0830  Physical exam:  Vitals:   11/16/20 0902  BP: 132/84  Pulse: 80  Resp: 16  Temp: (!) 97.3 F (36.3 C)  TempSrc: Tympanic  Weight: 147 lb 3.2 oz (66.8 kg)  Height: '5\' 9"'$  (1.753 m)   Physical Exam HENT:     Head: Normocephalic and atraumatic.  Eyes:     Pupils: Pupils are  equal, round, and reactive to light.  Cardiovascular:     Rate and Rhythm: Normal rate and regular rhythm.     Heart sounds: Normal heart sounds.  Pulmonary:     Effort: Pulmonary effort is normal.     Breath sounds: Normal breath sounds.  Abdominal:     General: Bowel sounds are normal.     Palpations: Abdomen is soft.  Musculoskeletal:     Cervical back: Normal range of motion.  Skin:    General: Skin is warm and dry.  Neurological:     Mental Status: He is alert and oriented to person, place, and time.     CMP Latest Ref Rng & Units 10/26/2020  Glucose 70 - 99 mg/dL 108(H)  BUN 8 - 23 mg/dL 20  Creatinine 0.61 - 1.24 mg/dL 0.76  Sodium 135 - 145 mmol/L 133(L)  Potassium 3.5 - 5.1 mmol/L 4.0  Chloride 98 - 111 mmol/L 101  CO2 22 - 32 mmol/L 26  Calcium 8.9 - 10.3 mg/dL 9.1  Total Protein 6.5 - 8.1 g/dL 7.9  Total Bilirubin 0.3 - 1.2 mg/dL 0.5  Alkaline Phos 38 - 126 U/L 244(H)  AST 15 - 41 U/L 46(H)  ALT 0 - 44 U/L 44   CBC Latest Ref Rng & Units 11/09/2020  WBC 4.0 - 10.5 K/uL -  Hemoglobin 13.0 - 17.0 g/dL 12.8(L)  Hematocrit 39.0 - 52.0 % 38.7(L)  Platelets 150 - 400 K/uL -      Assessment and plan- Patient is a 65 y.o. male with locally advanced hepatocellular carcinoma here for on treatment assessment prior to cycle 10 of a Avastin and Tecentriq and ongoing phlebotomy  Counts okay to proceed with cycle 10 of a Avastin and Tecentriq today.  Blood pressure is acceptable and urine protein remains +1 to proceed with a Avastin today.  I will see him back in 3 weeks for cycle 11.  aFP continues to trend down indicating excellent response to treatment.  Hemochromatosis and iron overload: Patient will continue to receive weekly phlebotomies until ferritin is less than 50.  Abnormal LFTs: Improved significantly after phlebotomy.   Visit Diagnosis 1. Cancer, hepatocellular ([place])   2. Encounter for monoclonal antibody treatment for malignancy   3. Encounter for  antineoplastic immunotherapy   4. Hereditary hemochromatosis ([place])   5. Iron overload   6. Abnormal LFTs      Dr. Randa Evens,  MD, MPH [facility] at [facility] [id number] 11/16/2020 8:45 AM

## 2020-11-16 NOTE — Progress Notes (Signed)
Pt doing good, no concerns. He did got get blood draw back through port today.

## 2020-11-16 NOTE — Progress Notes (Signed)
1000: Per Dr. Sherwood Gambler to proceed with urine protein with 10/26/20 of 52, no need to wait for today's results.  Okay to use port for treatment with no blood return, port flushing without difficulty, no pain or swelling noted. Okay to proceed with 500 cc phlebotomy with 500cc over 30 min NS fluid replacement using Ferritin from 10/26/20.   Pt tolerated treatment and phlebotomy well. Pt stable at discharge.

## 2020-11-17 LAB — AFP TUMOR MARKER: AFP, Serum, Tumor Marker: 15.1 ng/mL — ABNORMAL HIGH (ref 0.0–8.4)

## 2020-11-22 ENCOUNTER — Inpatient Hospital Stay: Payer: 59

## 2020-11-22 ENCOUNTER — Other Ambulatory Visit: Payer: Self-pay

## 2020-11-22 VITALS — BP 122/70 | HR 75 | Temp 98.2°F | Resp 18

## 2020-11-22 DIAGNOSIS — Z5112 Encounter for antineoplastic immunotherapy: Secondary | ICD-10-CM | POA: Diagnosis not present

## 2020-11-22 DIAGNOSIS — C220 Liver cell carcinoma: Secondary | ICD-10-CM

## 2020-11-22 LAB — HEMOGLOBIN AND HEMATOCRIT, BLOOD
HCT: 42.2 % (ref 39.0–52.0)
Hemoglobin: 13.9 g/dL (ref 13.0–17.0)

## 2020-11-22 MED ORDER — SODIUM CHLORIDE 0.9% FLUSH
10.0000 mL | INTRAVENOUS | Status: DC | PRN
  Administered 2020-11-22: 10 mL via INTRAVENOUS
  Filled 2020-11-22: qty 10

## 2020-11-22 MED ORDER — SODIUM CHLORIDE 0.9 % IV SOLN
Freq: Once | INTRAVENOUS | Status: AC
  Filled 2020-11-22: qty 250

## 2020-11-22 MED ORDER — HEPARIN SOD (PORK) LOCK FLUSH 100 UNIT/ML IV SOLN
500.0000 [IU] | Freq: Once | INTRAVENOUS | Status: AC
Start: 2020-11-22 — End: 2020-11-22
  Administered 2020-11-22: 500 [IU] via INTRAVENOUS
  Filled 2020-11-22: qty 5

## 2020-11-22 NOTE — Progress Notes (Signed)
Hgb 13.9. Removed 500 ml blood removed via PIV R AC per phlebotomy orders. IVFs given via port for fluid replacement. Pt feeling well today. Discharged to home.

## 2020-11-29 ENCOUNTER — Other Ambulatory Visit: Payer: Self-pay

## 2020-11-29 ENCOUNTER — Inpatient Hospital Stay: Payer: 59

## 2020-11-29 DIAGNOSIS — C220 Liver cell carcinoma: Secondary | ICD-10-CM

## 2020-11-29 DIAGNOSIS — Z5112 Encounter for antineoplastic immunotherapy: Secondary | ICD-10-CM | POA: Diagnosis not present

## 2020-11-29 LAB — FERRITIN: Ferritin: 734 ng/mL — ABNORMAL HIGH (ref 24–336)

## 2020-11-29 LAB — HEMOGLOBIN AND HEMATOCRIT, BLOOD
HCT: 42.7 % (ref 39.0–52.0)
Hemoglobin: 14.1 g/dL (ref 13.0–17.0)

## 2020-11-29 MED ORDER — SODIUM CHLORIDE 0.9% FLUSH
10.0000 mL | INTRAVENOUS | Status: DC | PRN
  Administered 2020-11-29: 10 mL via INTRAVENOUS
  Filled 2020-11-29: qty 10

## 2020-11-29 MED ORDER — HEPARIN SOD (PORK) LOCK FLUSH 100 UNIT/ML IV SOLN
500.0000 [IU] | Freq: Once | INTRAVENOUS | Status: AC
  Administered 2020-11-29: 500 [IU] via INTRAVENOUS
  Filled 2020-11-29: qty 5

## 2020-11-29 MED ORDER — SODIUM CHLORIDE 0.9 % IV SOLN
Freq: Once | INTRAVENOUS | Status: AC
Start: 2020-11-29 — End: 2020-11-29
  Filled 2020-11-29: qty 250

## 2020-11-29 NOTE — Progress Notes (Signed)
Therapeutic phlebotomy performed. Removed 500 ml of blood via PIV right arm. Replaced 1 L NS via port access. Patient denies any weakness; Discharged to home.

## 2020-11-29 NOTE — Patient Instructions (Signed)

## 2020-12-07 ENCOUNTER — Inpatient Hospital Stay: Payer: 59

## 2020-12-07 ENCOUNTER — Encounter: Payer: Self-pay | Admitting: Oncology

## 2020-12-07 ENCOUNTER — Inpatient Hospital Stay (HOSPITAL_BASED_OUTPATIENT_CLINIC_OR_DEPARTMENT_OTHER): Payer: 59 | Admitting: Oncology

## 2020-12-07 VITALS — BP 129/72 | HR 76 | Resp 18

## 2020-12-07 DIAGNOSIS — C220 Liver cell carcinoma: Secondary | ICD-10-CM

## 2020-12-07 DIAGNOSIS — Z5112 Encounter for antineoplastic immunotherapy: Secondary | ICD-10-CM

## 2020-12-07 LAB — CBC WITH DIFFERENTIAL/PLATELET
Abs Immature Granulocytes: 0.01 10*3/uL (ref 0.00–0.07)
Basophils Absolute: 0.1 10*3/uL (ref 0.0–0.1)
Basophils Relative: 1 %
Eosinophils Absolute: 0.1 10*3/uL (ref 0.0–0.5)
Eosinophils Relative: 1 %
HCT: 43.1 % (ref 39.0–52.0)
Hemoglobin: 14.3 g/dL (ref 13.0–17.0)
Immature Granulocytes: 0 %
Lymphocytes Relative: 22 %
Lymphs Abs: 1.3 10*3/uL (ref 0.7–4.0)
MCH: 33.8 pg (ref 26.0–34.0)
MCHC: 33.2 g/dL (ref 30.0–36.0)
MCV: 101.9 fL — ABNORMAL HIGH (ref 80.0–100.0)
Monocytes Absolute: 0.7 10*3/uL (ref 0.1–1.0)
Monocytes Relative: 11 %
Neutro Abs: 4 10*3/uL (ref 1.7–7.7)
Neutrophils Relative %: 65 %
Platelets: 120 10*3/uL — ABNORMAL LOW (ref 150–400)
RBC: 4.23 MIL/uL (ref 4.22–5.81)
RDW: 12.4 % (ref 11.5–15.5)
WBC: 6.2 10*3/uL (ref 4.0–10.5)
nRBC: 0 % (ref 0.0–0.2)

## 2020-12-07 LAB — COMPREHENSIVE METABOLIC PANEL
ALT: 45 U/L — ABNORMAL HIGH (ref 0–44)
AST: 48 U/L — ABNORMAL HIGH (ref 15–41)
Albumin: 3.7 g/dL (ref 3.5–5.0)
Alkaline Phosphatase: 281 U/L — ABNORMAL HIGH (ref 38–126)
Anion gap: 8 (ref 5–15)
BUN: 14 mg/dL (ref 8–23)
CO2: 25 mmol/L (ref 22–32)
Calcium: 9.2 mg/dL (ref 8.9–10.3)
Chloride: 99 mmol/L (ref 98–111)
Creatinine, Ser: 0.89 mg/dL (ref 0.61–1.24)
GFR, Estimated: >60 mL/min (ref >60)
Glucose, Bld: 116 mg/dL — ABNORMAL HIGH (ref 70–99)
Potassium: 3.9 mmol/L (ref 3.5–5.1)
Sodium: 132 mmol/L — ABNORMAL LOW (ref 135–145)
Total Bilirubin: 0.6 mg/dL (ref 0.3–1.2)
Total Protein: 8.3 g/dL — ABNORMAL HIGH (ref 6.5–8.1)

## 2020-12-07 LAB — PROTEIN, URINE, RANDOM: Total Protein, Urine: 76 mg/dL

## 2020-12-07 MED ORDER — SODIUM CHLORIDE 0.9 % IV SOLN
1200.0000 mg | Freq: Once | INTRAVENOUS | Status: AC
  Administered 2020-12-07: 1200 mg via INTRAVENOUS
  Filled 2020-12-07: qty 20

## 2020-12-07 MED ORDER — SODIUM CHLORIDE 0.9 % IV SOLN
Freq: Once | INTRAVENOUS | Status: AC
  Administered 2020-12-07: 500 mL via INTRAVENOUS
  Filled 2020-12-07: qty 250

## 2020-12-07 MED ORDER — SODIUM CHLORIDE 0.9 % IV SOLN
Freq: Once | INTRAVENOUS | Status: AC
  Filled 2020-12-07: qty 250

## 2020-12-07 MED ORDER — HEPARIN SOD (PORK) LOCK FLUSH 100 UNIT/ML IV SOLN
500.0000 [IU] | Freq: Once | INTRAVENOUS | Status: AC | PRN
  Filled 2020-12-07: qty 5

## 2020-12-07 MED ORDER — SODIUM CHLORIDE 0.9 % IV SOLN
15.0000 mg/kg | Freq: Once | INTRAVENOUS | Status: AC
  Administered 2020-12-07: 900 mg via INTRAVENOUS
  Filled 2020-12-07: qty 32

## 2020-12-07 MED ORDER — HEPARIN SOD (PORK) LOCK FLUSH 100 UNIT/ML IV SOLN
INTRAVENOUS | Status: AC
  Administered 2020-12-07: 500 [IU]
  Filled 2020-12-07: qty 5

## 2020-12-07 NOTE — Progress Notes (Signed)
Hematology/Oncology Consult note [facility]  [phone] [phone]  Patient Care Team: Venita Lick, NP as PCP - General (Nurse Practitioner) Clent Jacks, RN as Oncology Nurse Navigator Sindy Guadeloupe, MD as Consulting Physician (Hematology and Oncology)   Name of the patient: [facility]  [id number]  12-Aug-1955   Date of visit: 12/07/20  Diagnosis- locally advanced unresectable hepatocellular carcinoma  Chief complaint/ Reason for visit-on treatment assessment prior to cycle 11 of a Avastin and Tecentriq  Heme/Onc history: patient is a 65 year old male with a past medical history significant for hypertension hyperlipidemia, 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  Reports that he has lost about 20 pounds over the last 2 to 3 months. He is a chronic smoker which prompted a CT chest.  CT chest without contrast on 04/06/2020 showed no evidence of mediastinal or hilar adenopathy.  He was found to have 4 subcentimeter lung nodules 3 in the right lobe and one in the left upper lobe which were borderline for metastatic disease.  Cirrhotic liver morphology with 8.3 x 7 cm ill-defined hypoattenuating lesion in the left liver with a 3.3 cm lesion in the dome of the liver near the junction of the right and left hepatic lobes.  Labs showed elevated AST and ALT of 62 and 47 respectively with a normal albumin and normal total bilirubin.  CMP showed elevated H&H of 18.3/53.8.  Hepatitis C and HIV testing was negative. 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  Another mass in the segment 4A measuring 3.4 x 2.9 cm.  No evidence of local regional adenopathy.   Alpha-fetoprotein elevated at  38,000.  CA 19-9 elevated at 147.Patient's case discussed at tumor board and consensus was that this would be consistent with [facility] especially given the AFP value.  Initially MRI was not characteristic for [facility] but a biopsy was not deemed necessary after AFP results were back   Avastin Tecentriq started in March 2022.  He is also getting weekly phlebotomy for iron overload    Interval history-patient is tolerating treatment very well without any significant side effects.  Appetite and weight have remained stable.  He is active and independent of his ADLs.  ECOG PS- 1 Pain scale- 0   Review of systems- Review of Systems  Constitutional:  Negative for chills, fever, malaise/fatigue and weight loss.  HENT:  Negative for congestion, ear discharge and nosebleeds.   Eyes:  Negative for blurred vision.  Respiratory:  Negative for cough, hemoptysis, sputum production, shortness of breath and wheezing.   Cardiovascular:  Negative for chest pain, palpitations, orthopnea and claudication.  Gastrointestinal:  Negative for abdominal pain, blood in stool, constipation, diarrhea, heartburn, melena, nausea and vomiting.  Genitourinary:  Negative for dysuria, flank pain, frequency, hematuria and urgency.  Musculoskeletal:  Negative for back pain, joint pain and myalgias.  Skin:  Negative for rash.  Neurological:  Negative for dizziness, tingling, focal weakness, seizures, weakness and headaches.  Endo/Heme/Allergies:  Does not bruise/bleed easily.  Psychiatric/Behavioral:  Negative for depression and suicidal ideas. The patient does not have insomnia.      No Known Allergies   Past Medical History:  Diagnosis Date   Barrett esophagus  CAD (coronary artery disease)    Cancer (HCC)    liver cancer   Cirrhosis (HCC)    Heart disease    Hereditary hemochromatosis ([place])      Past Surgical History:  Procedure Laterality Date   CORONARY ARTERY BYPASS GRAFT     at 65 years old    ESOPHAGOGASTRODUODENOSCOPY (EGD) WITH PROPOFOL N/A 05/21/2020   Procedure: ESOPHAGOGASTRODUODENOSCOPY (EGD) WITH PROPOFOL;  Surgeon: Lin Landsman, MD;  Location: [facility];  Service: Gastroenterology;  Laterality: N/A;   LAPAROSCOPIC LYSIS OF ADHESIONS  06/12/2020   Procedure: LAPAROSCOPIC LYSIS OF ADHESIONS;  Surgeon: Jules Husbands, MD;  Location: [facility];  Service: General;;   PORTA CATH INSERTION N/A 04/30/2020   Procedure: PORTA CATH INSERTION;  Surgeon: Algernon Huxley, MD;  Location: [facility];  Service: Cardiovascular;  Laterality: N/A;   VEIN BYPASS SURGERY      Social History   Socioeconomic History   Marital status: Divorced    Spouse name: Not on file   Number of children: 2   Years of education: Not on file   Highest education level: Not on file  Occupational History   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[employer] Use   Smoking status: Some Days    Packs/day: 0.25    Types: Cigarettes   Smokeless tobacco: Never  Vaping Use   Vaping Use: Never used  Substance and Sexual Activity   Alcohol use: Not Currently   Drug use: Not Currently   Sexual activity: Not Currently  Other Topics Concern   Not on file  Social History Narrative   Lives by himself    Social Determinants of Health   Financial Resource Strain: Not on file  Food Insecurity: Not on file  Transportation Needs: Not on file  Physical Activity: Not on file  Stress: Not on file  Social Connections: Not on file  Intimate Partner Violence: Not on file    Family History  Problem Relation Age of Onset   Dementia Mother    Ulcers Father    Heart attack Brother    Throat cancer Brother    Heart attack Daughter    Diabetes Daughter    Depression Daughter      Current Outpatient Medications:    folic acid (FOLVITE) 1 MG tablet, Take 2 tablets (2 mg total) by mouth daily., Disp: 60 tablet, Rfl: 4   lidocaine-prilocaine (EMLA) cream, Apply to affected area once  (Patient taking differently: Apply 1 application topically See admin instructions. Apply to affected area once as needed), Disp: 30 g, Rfl: 3   LORazepam (ATIVAN) 0.5 MG tablet, Take 1 tablet (0.5 mg total) by mouth every 8 (eight) hours. For tremors, Disp: 180 tablet, Rfl: 0   fluticasone (FLONASE) 50 MCG/ACT nasal spray, Place into both nostrils. (Patient not taking: No sig reported), Disp: , Rfl:    HYDROcodone-acetaminophen (NORCO/VICODIN) 5-325 MG tablet, Take 1 tablet by mouth every 6 (six) hours as needed for moderate pain or severe pain. (Patient not taking: No sig reported), Disp: 60 tablet, Rfl: 0   lactulose (CHRONULAC) 10 GM/15ML solution, Take 15 mLs (10 g total) by mouth daily. (Patient not taking: No sig reported), Disp: 236 mL, Rfl: 1   mirtazapine (REMERON) 15 MG tablet, Take 1 tablet (15 mg total) by mouth at bedtime. (Patient not taking: No sig reported), Disp: 30 tablet, Rfl: 2   omeprazole (PRILOSEC) 40 MG capsule, Take 1 capsule by mouth every  morning. (Patient not taking: No sig reported), Disp: , Rfl:    ondansetron (ZOFRAN) 8 MG tablet, Take 1 tablet (8 mg total) by mouth 2 (two) times daily as needed (Nausea or vomiting). (Patient not taking: No sig reported), Disp: 45 tablet, Rfl: 2   prochlorperazine (COMPAZINE) 10 MG tablet, Take 1 tablet (10 mg total) by mouth every 6 (six) hours as needed (Nausea or vomiting). (Patient not taking: No sig reported), Disp: 30 tablet, Rfl: 1 No current facility-administered medications for this visit.  Facility-Administered Medications Ordered in Other Visits:    atezolizumab (TECENTRIQ) 1,200 mg in sodium chloride 0.9 % 250 mL chemo infusion, 1,200 mg, Intravenous, Once, Sindy Guadeloupe, MD   bevacizumab (AVASTIN) 900 mg in sodium chloride 0.9 % 100 mL chemo infusion, 15 mg/kg (Order-Specific), Intravenous, Once, Sindy Guadeloupe, MD   heparin lock flush 100 unit/mL, 500 Units, Intracatheter, Once PRN, Sindy Guadeloupe, MD  Physical exam:   Vitals:   12/07/20 0832  BP: 120/65  Pulse: 85  Resp: 16  Temp: 97.9 F (36.6 C)  TempSrc: Tympanic  SpO2: 100%  Weight: 146 lb 9.6 oz (66.5 kg)   Physical Exam Constitutional:      General: He is not in acute distress. Cardiovascular:     Rate and Rhythm: Normal rate and regular rhythm.     Heart sounds: Normal heart sounds.  Pulmonary:     Effort: Pulmonary effort is normal.     Breath sounds: Normal breath sounds.  Abdominal:     General: Bowel sounds are normal.     Palpations: Abdomen is soft.  Skin:    General: Skin is warm and dry.  Neurological:     Mental Status: He is alert and oriented to person, place, and time.     CMP Latest Ref Rng & Units 12/07/2020  Glucose 70 - 99 mg/dL 116(H)  BUN 8 - 23 mg/dL 14  Creatinine 0.61 - 1.24 mg/dL 0.89  Sodium 135 - 145 mmol/L 132(L)  Potassium 3.5 - 5.1 mmol/L 3.9  Chloride 98 - 111 mmol/L 99  CO2 22 - 32 mmol/L 25  Calcium 8.9 - 10.3 mg/dL 9.2  Total Protein 6.5 - 8.1 g/dL 8.3(H)  Total Bilirubin 0.3 - 1.2 mg/dL 0.6  Alkaline Phos 38 - 126 U/L 281(H)  AST 15 - 41 U/L 48(H)  ALT 0 - 44 U/L 45(H)   CBC Latest Ref Rng & Units 12/07/2020  WBC 4.0 - 10.5 K/uL 6.2  Hemoglobin 13.0 - 17.0 g/dL 14.3  Hematocrit 39.0 - 52.0 % 43.1  Platelets 150 - 400 K/uL 120(L)     Assessment and plan- Patient is a 65 y.o. male with locally advanced hepatocellular carcinoma here for on treatment assessment prior to cycle 11 of Avastin and Tecentriq.  Counts okay to proceed with cycle 11 of a Avastin and Tecentriq today.  Blood pressure is stable and urine protein is +1.  aFP continues to trend down.  I will see him back in 3 weeks with port labs for cycle 12 of Avastin and Tecentriq.  Plan to repeat scans in November  Hemochromatosis and iron overload: Continue to get weekly phlebotomy until ferritin is less than 100.  Last ferritin 2 weeks ago was 730.  Abnormal LFTs: Likely secondary to iron overload.  Overall significantly  improved since initiating phlebotomy.  Continue to monitor   Visit Diagnosis 1. Hereditary hemochromatosis ([place])   2. Cancer, hepatocellular ([place])   3. Encounter for monoclonal antibody treatment  for malignancy   4. Encounter for antineoplastic immunotherapy      Dr. Randa Evens, MD, MPH [facility] at [facility] [phone] 12/07/2020 10:04 AM

## 2020-12-07 NOTE — Progress Notes (Signed)
Nutrition Follow-up:  Patient with advanced hepatocellular cancer.  Patient receiving avastin and tecentriq. Also having phlebotomy.    Met with patient during infusion.  Patient reports that his appetite is good.  He prepares meals or goes out and gets something. Reports ate 2 pancakes yesterday at breakfast. Lunch was sausage dog with fries. Dinner was fish sticks, shrimp then later had ice cream.  Drinks boost shakes about 2 times per day if he does not forget.    Denies any nutrition impact symptoms at this time.     Medications: reviewed  Labs: reviewed  Anthropometrics:   Weight 146 lb 9.6 oz today  143 lb 6 oz on 8/19 134 lb on 6/17 138 lb on 5/6 132 lb on 4/8 134 lb on 4/4 145 lb on 3/22  NUTRITION DIAGNOSIS:  Inadequate oral intake improving   INTERVENTION:  Continue oral nutrition supplements 1-2 times per day.  Coupons given today Continue to encouraged well balanced diet including good sources of protein     MONITORING, EVALUATION, GOAL: weight trends, intake   NEXT VISIT: as needed  Layken Beg B. Zenia Resides, [place], [place] Registered Dietitian [phone] (mobile)

## 2020-12-07 NOTE — Patient Instructions (Signed)
[facility]   Discharge Instructions: Thank you for choosing [facility] to provide your oncology and hematology care.  If you have a lab appointment with the [facility], please go directly to the [facility] and check in at the registration area.  Wear comfortable clothing and clothing appropriate for easy access to any Portacath or PICC line.   We strive to give you quality time with your provider. You may need to reschedule your appointment if you arrive late (15 or more minutes).  Arriving late affects you and other patients whose appointments are after yours.  Also, if you miss three or more appointments without notifying the office, you may be dismissed from the clinic at the provider's discretion.      For prescription refill requests, have your pharmacy contact our office and allow 72 hours for refills to be completed.    Today you received the following chemotherapy and/or immunotherapy agents: Tecentriq and Avastin. Today you also received the following procedure: Therapeutic Phlebotomy.      To help prevent nausea and vomiting after your treatment, we encourage you to take your nausea medication as directed.  BELOW ARE SYMPTOMS THAT SHOULD BE REPORTED IMMEDIATELY: *FEVER GREATER THAN 100.4 F (38 C) OR HIGHER *CHILLS OR SWEATING *NAUSEA AND VOMITING THAT IS NOT CONTROLLED WITH YOUR NAUSEA MEDICATION *UNUSUAL SHORTNESS OF BREATH *UNUSUAL BRUISING OR BLEEDING *URINARY PROBLEMS (pain or burning when urinating, or frequent urination) *BOWEL PROBLEMS (unusual diarrhea, constipation, pain near the anus) TENDERNESS IN MOUTH AND THROAT WITH OR WITHOUT PRESENCE OF ULCERS (sore throat, sores in mouth, or a toothache) UNUSUAL RASH, SWELLING OR PAIN  UNUSUAL VAGINAL DISCHARGE OR ITCHING   Items with * indicate a potential emergency and should be followed up as soon as possible or go to the Emergency Department if any problems should  occur.  Please show the CHEMOTHERAPY ALERT CARD or IMMUNOTHERAPY ALERT CARD at check-in to the Emergency Department and triage nurse.  Should you have questions after your visit or need to cancel or reschedule your appointment, please contact Mobile  [phone] and follow the prompts.  Office hours are 8:00 a.m. to 4:30 p.m. Monday - Friday. Please note that voicemails left after 4:00 p.m. may not be returned until the following business day.  We are closed weekends and major holidays. You have access to a nurse at all times for urgent questions. Please call the main number to the clinic [phone] and follow the prompts.  For any non-urgent questions, you may also contact your provider using MyChart. We now offer e-Visits for anyone 74 and older to request care online for non-urgent symptoms. For details visit [email or web address].   Also download the MyChart app! Go to the app store, search "MyChart", open the app, select [facility], and log in with your MyChart username and password.  Due to Covid, a mask is required upon entering the hospital/clinic. If you do not have a mask, one will be given to you upon arrival. For doctor visits, patients may have 1 support person aged 55 or older with them. For treatment visits, patients cannot have anyone with them due to current Covid guidelines and our immunocompromised population.

## 2020-12-07 NOTE — Progress Notes (Signed)
Eating well. Energy is good. Sleeping well. Denies any pain. Here today for immunotherapy and phlebotomy.

## 2020-12-07 NOTE — Progress Notes (Signed)
Hemoglobin: 14.3, Hematocrit: 43.1 today. Ferritin: 734 on 11/29/2020. MD, Dr. Janese Banks, notified and aware. Per MD order: proceed with 500 ml phlebotomy today; order is in supportive therapy plan.

## 2020-12-08 LAB — AFP TUMOR MARKER: AFP, Serum, Tumor Marker: 5.9 ng/mL (ref 0.0–8.4)

## 2020-12-14 ENCOUNTER — Encounter: Payer: Self-pay | Admitting: *Deleted

## 2020-12-14 ENCOUNTER — Inpatient Hospital Stay: Payer: 59 | Attending: Oncology

## 2020-12-14 ENCOUNTER — Other Ambulatory Visit: Payer: Self-pay

## 2020-12-14 ENCOUNTER — Inpatient Hospital Stay: Payer: 59

## 2020-12-14 VITALS — BP 123/69 | HR 72 | Temp 98.3°F | Resp 18

## 2020-12-14 DIAGNOSIS — C220 Liver cell carcinoma: Secondary | ICD-10-CM | POA: Insufficient documentation

## 2020-12-14 DIAGNOSIS — Z5112 Encounter for antineoplastic immunotherapy: Secondary | ICD-10-CM | POA: Insufficient documentation

## 2020-12-14 LAB — HEMOGLOBIN AND HEMATOCRIT, BLOOD
HCT: 43.4 % (ref 39.0–52.0)
Hemoglobin: 13.9 g/dL (ref 13.0–17.0)

## 2020-12-14 MED ORDER — SODIUM CHLORIDE 0.9% FLUSH
10.0000 mL | INTRAVENOUS | Status: DC | PRN
  Administered 2020-12-14: 10 mL via INTRAVENOUS
  Filled 2020-12-14: qty 10

## 2020-12-14 MED ORDER — HEPARIN SOD (PORK) LOCK FLUSH 100 UNIT/ML IV SOLN
500.0000 [IU] | Freq: Once | INTRAVENOUS | Status: AC
  Administered 2020-12-14: 500 [IU] via INTRAVENOUS
  Filled 2020-12-14: qty 5

## 2020-12-14 MED ORDER — SODIUM CHLORIDE 0.9 % IV SOLN
Freq: Once | INTRAVENOUS | Status: AC
  Filled 2020-12-14: qty 250

## 2020-12-14 NOTE — Patient Instructions (Signed)

## 2020-12-14 NOTE — Progress Notes (Signed)
[place] Work  Clinical Social Work received phone call from patient's ex-wife, Raydin Bielinski.  She shared she is helping patient navigate financial needs in regard to medical bills and insurance.  She indicated patient has medical bills he is unable to pay- CSW made referral to [facility] financial counseling for assessment of need and support.  Patient turns 65 next month and will be eligible for Medicare. CSW provided information for [facility] counselor and encouraged patient/family to seek counsel from counselors to determine best plan for patient's situation.    Gwinda Maine, LCSW  Clinical Social Worker [facility]

## 2020-12-14 NOTE — Progress Notes (Signed)
500 ml blood removed per phlebotomy order via R AC PIV. Port accessed for Fluid replacement of 1 L NS. Patient tolerated procedure well. Discharged to home. VSS.

## 2020-12-20 ENCOUNTER — Encounter: Payer: Self-pay | Admitting: Oncology

## 2020-12-20 ENCOUNTER — Inpatient Hospital Stay: Payer: 59

## 2020-12-20 VITALS — BP 121/71 | HR 77 | Temp 98.4°F | Resp 18

## 2020-12-20 DIAGNOSIS — C220 Liver cell carcinoma: Secondary | ICD-10-CM

## 2020-12-20 DIAGNOSIS — Z5112 Encounter for antineoplastic immunotherapy: Secondary | ICD-10-CM | POA: Diagnosis not present

## 2020-12-20 LAB — HEMOGLOBIN AND HEMATOCRIT, BLOOD
HCT: 41.7 % (ref 39.0–52.0)
Hemoglobin: 13.5 g/dL (ref 13.0–17.0)

## 2020-12-20 MED ORDER — SODIUM CHLORIDE 0.9% FLUSH
10.0000 mL | INTRAVENOUS | Status: DC | PRN
  Administered 2020-12-20: 10 mL via INTRAVENOUS
  Filled 2020-12-20: qty 10

## 2020-12-20 MED ORDER — HEPARIN SOD (PORK) LOCK FLUSH 100 UNIT/ML IV SOLN
500.0000 [IU] | Freq: Once | INTRAVENOUS | Status: AC
  Administered 2020-12-20: 500 [IU] via INTRAVENOUS
  Filled 2020-12-20: qty 5

## 2020-12-20 MED ORDER — SODIUM CHLORIDE 0.9 % IV SOLN
Freq: Once | INTRAVENOUS | Status: AC
  Filled 2020-12-20: qty 250

## 2020-12-20 NOTE — Patient Instructions (Signed)

## 2020-12-20 NOTE — Progress Notes (Signed)
500 ml of blood removed from R AC peripherally as per phlebotomy orders. IVF replacement 1 Liter infused via port access. Tolerated procedure well. VSS. Discharged to home.

## 2020-12-28 ENCOUNTER — Other Ambulatory Visit: Payer: Self-pay

## 2020-12-28 ENCOUNTER — Encounter: Payer: Self-pay | Admitting: Oncology

## 2020-12-28 ENCOUNTER — Inpatient Hospital Stay: Payer: 59

## 2020-12-28 ENCOUNTER — Inpatient Hospital Stay (HOSPITAL_BASED_OUTPATIENT_CLINIC_OR_DEPARTMENT_OTHER): Payer: 59 | Admitting: Oncology

## 2020-12-28 VITALS — BP 122/78 | HR 83 | Temp 98.0°F | Resp 16 | Wt 144.5 lb

## 2020-12-28 VITALS — BP 116/69 | HR 75

## 2020-12-28 DIAGNOSIS — C220 Liver cell carcinoma: Secondary | ICD-10-CM

## 2020-12-28 DIAGNOSIS — Z5112 Encounter for antineoplastic immunotherapy: Secondary | ICD-10-CM | POA: Diagnosis not present

## 2020-12-28 LAB — CBC WITH DIFFERENTIAL/PLATELET
Abs Immature Granulocytes: 0.02 10*3/uL (ref 0.00–0.07)
Basophils Absolute: 0 10*3/uL (ref 0.0–0.1)
Basophils Relative: 1 %
Eosinophils Absolute: 0.1 10*3/uL (ref 0.0–0.5)
Eosinophils Relative: 2 %
HCT: 41.1 % (ref 39.0–52.0)
Hemoglobin: 13.6 g/dL (ref 13.0–17.0)
Immature Granulocytes: 1 %
Lymphocytes Relative: 30 %
Lymphs Abs: 1.2 10*3/uL (ref 0.7–4.0)
MCH: 33 pg (ref 26.0–34.0)
MCHC: 33.1 g/dL (ref 30.0–36.0)
MCV: 99.8 fL (ref 80.0–100.0)
Monocytes Absolute: 0.4 10*3/uL (ref 0.1–1.0)
Monocytes Relative: 10 %
Neutro Abs: 2.3 10*3/uL (ref 1.7–7.7)
Neutrophils Relative %: 56 %
Platelets: 90 10*3/uL — ABNORMAL LOW (ref 150–400)
RBC: 4.12 MIL/uL — ABNORMAL LOW (ref 4.22–5.81)
RDW: 12.1 % (ref 11.5–15.5)
WBC: 4 10*3/uL (ref 4.0–10.5)
nRBC: 0 % (ref 0.0–0.2)

## 2020-12-28 LAB — COMPREHENSIVE METABOLIC PANEL
ALT: 53 U/L — ABNORMAL HIGH (ref 0–44)
AST: 70 U/L — ABNORMAL HIGH (ref 15–41)
Albumin: 3.6 g/dL (ref 3.5–5.0)
Alkaline Phosphatase: 282 U/L — ABNORMAL HIGH (ref 38–126)
Anion gap: 7 (ref 5–15)
BUN: 13 mg/dL (ref 8–23)
CO2: 25 mmol/L (ref 22–32)
Calcium: 8.8 mg/dL — ABNORMAL LOW (ref 8.9–10.3)
Chloride: 100 mmol/L (ref 98–111)
Creatinine, Ser: 0.81 mg/dL (ref 0.61–1.24)
GFR, Estimated: >60 mL/min (ref >60)
Glucose, Bld: 100 mg/dL — ABNORMAL HIGH (ref 70–99)
Potassium: 4.2 mmol/L (ref 3.5–5.1)
Sodium: 132 mmol/L — ABNORMAL LOW (ref 135–145)
Total Bilirubin: 0.5 mg/dL (ref 0.3–1.2)
Total Protein: 7.8 g/dL (ref 6.5–8.1)

## 2020-12-28 LAB — PROTEIN, URINE, RANDOM: Total Protein, Urine: 87 mg/dL

## 2020-12-28 LAB — FERRITIN: Ferritin: 515 ng/mL — ABNORMAL HIGH (ref 24–336)

## 2020-12-28 MED ORDER — HEPARIN SOD (PORK) LOCK FLUSH 100 UNIT/ML IV SOLN
INTRAVENOUS | Status: AC
  Administered 2020-12-28: 500 [IU]
  Filled 2020-12-28: qty 5

## 2020-12-28 MED ORDER — GABAPENTIN 300 MG PO CAPS: ORAL_CAPSULE | ORAL | 0 refills | Status: DC

## 2020-12-28 MED ORDER — SODIUM CHLORIDE 0.9 % IV SOLN
Freq: Once | INTRAVENOUS | Status: AC
  Filled 2020-12-28: qty 250

## 2020-12-28 MED ORDER — SODIUM CHLORIDE 0.9 % IV SOLN
1200.0000 mg | Freq: Once | INTRAVENOUS | Status: AC
  Administered 2020-12-28: 1200 mg via INTRAVENOUS
  Filled 2020-12-28: qty 20

## 2020-12-28 MED ORDER — SODIUM CHLORIDE 0.9 % IV SOLN
15.0000 mg/kg | Freq: Once | INTRAVENOUS | Status: AC
  Administered 2020-12-28: 900 mg via INTRAVENOUS
  Filled 2020-12-28: qty 32

## 2020-12-28 NOTE — Progress Notes (Signed)
Pt is wanting to know if he can have a stronger dose for his "shaky medication."

## 2020-12-28 NOTE — Patient Instructions (Signed)
[place] ONCOLOGY  Discharge Instructions: Thank you for choosing [facility] to provide your oncology and hematology care.  If you have a lab appointment with the [facility], please go directly to the [facility] and check in at the registration area.  Wear comfortable clothing and clothing appropriate for easy access to any Portacath or PICC line.   We strive to give you quality time with your provider. You may need to reschedule your appointment if you arrive late (15 or more minutes).  Arriving late affects you and other patients whose appointments are after yours.  Also, if you miss three or more appointments without notifying the office, you may be dismissed from the clinic at the provider's discretion.      For prescription refill requests, have your pharmacy contact our office and allow 72 hours for refills to be completed.    Today you received the following chemotherapy and/or immunotherapy agents : Tecentriq / Avastin   To help prevent nausea and vomiting after your treatment, we encourage you to take your nausea medication as directed.  BELOW ARE SYMPTOMS THAT SHOULD BE REPORTED IMMEDIATELY: *FEVER GREATER THAN 100.4 F (38 C) OR HIGHER *CHILLS OR SWEATING *NAUSEA AND VOMITING THAT IS NOT CONTROLLED WITH YOUR NAUSEA MEDICATION *UNUSUAL SHORTNESS OF BREATH *UNUSUAL BRUISING OR BLEEDING *URINARY PROBLEMS (pain or burning when urinating, or frequent urination) *BOWEL PROBLEMS (unusual diarrhea, constipation, pain near the anus) TENDERNESS IN MOUTH AND THROAT WITH OR WITHOUT PRESENCE OF ULCERS (sore throat, sores in mouth, or a toothache) UNUSUAL RASH, SWELLING OR PAIN  UNUSUAL VAGINAL DISCHARGE OR ITCHING   Items with * indicate a potential emergency and should be followed up as soon as possible or go to the Emergency Department if any problems should occur.  Please show the CHEMOTHERAPY ALERT CARD or IMMUNOTHERAPY ALERT CARD at  check-in to the Emergency Department and triage nurse.  Should you have questions after your visit or need to cancel or reschedule your appointment, please contact [facility]  [phone] and follow the prompts.  Office hours are 8:00 a.m. to 4:30 p.m. Monday - Friday. Please note that voicemails left after 4:00 p.m. may not be returned until the following business day.  We are closed weekends and major holidays. You have access to a nurse at all times for urgent questions. Please call the main number to the clinic [phone] and follow the prompts.  For any non-urgent questions, you may also contact your provider using MyChart. We now offer e-Visits for anyone 77 and older to request care online for non-urgent symptoms. For details visit [email or web address].   Also download the MyChart app! Go to the app store, search "MyChart", open the app, select , and log in with your MyChart username and password.  Due to Covid, a mask is required upon entering the hospital/clinic. If you do not have a mask, one will be given to you upon arrival. For doctor visits, patients may have 1 support person aged 60 or older with them. For treatment visits, patients cannot have anyone with them due to current Covid guidelines and our immunocompromised population.

## 2020-12-28 NOTE — Progress Notes (Signed)
Ok per MD to tx with plt 90  Phleb performed per MD order via 20 g angio / phleb bag. 500 ml withdrawn. 500 ml NS replaced. Pt tolerated without incident

## 2020-12-29 LAB — AFP TUMOR MARKER: AFP, Serum, Tumor Marker: 3.3 ng/mL (ref 0.0–8.4)

## 2021-01-02 ENCOUNTER — Other Ambulatory Visit: Payer: Self-pay

## 2021-01-02 ENCOUNTER — Inpatient Hospital Stay: Payer: 59

## 2021-01-02 DIAGNOSIS — Z5112 Encounter for antineoplastic immunotherapy: Secondary | ICD-10-CM | POA: Diagnosis not present

## 2021-01-02 MED ORDER — SODIUM CHLORIDE 0.9 % IV SOLN
Freq: Once | INTRAVENOUS | Status: AC
  Filled 2021-01-02: qty 250

## 2021-01-02 MED ORDER — SODIUM CHLORIDE 0.9% FLUSH
10.0000 mL | Freq: Once | INTRAVENOUS | Status: AC
  Administered 2021-01-02: 10 mL via INTRAVENOUS
  Filled 2021-01-02: qty 10

## 2021-01-02 MED ORDER — HEPARIN SOD (PORK) LOCK FLUSH 100 UNIT/ML IV SOLN
500.0000 [IU] | Freq: Once | INTRAVENOUS | Status: AC
  Administered 2021-01-02: 500 [IU] via INTRAVENOUS
  Filled 2021-01-02: qty 5

## 2021-01-02 NOTE — Patient Instructions (Signed)

## 2021-01-02 NOTE — Progress Notes (Signed)
Patient tolatered phlebotomy well today. 500 cc removed as ordered. Replaced with 500 cc NS as ordered. Ferritin 515. No concerns voiced. Patient discharged, stable.

## 2021-01-06 ENCOUNTER — Encounter: Payer: Self-pay | Admitting: Oncology

## 2021-01-06 NOTE — Progress Notes (Signed)
Hematology/Oncology Consult note [facility]  [phone] [phone]  Patient Care Team: Venita Lick, NP as PCP - General (Nurse Practitioner) Clent Jacks, RN as Oncology Nurse Navigator Sindy Guadeloupe, MD as Consulting Physician (Hematology and Oncology)   Name of the patient: Maxwell Edwards  [id number]  1956-02-24   Date of visit: 01/06/21  Diagnosis- locally advanced unresectable hepatocellular carcinoma  Chief complaint/ Reason for visit-on treatment assessment prior to cycle 12 of a Avastin and Tecentriq  Heme/Onc history: patient is a 65 year old male with a past medical history significant for hypertension hyperlipidemia, 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  Reports that he has lost about 20 pounds over the last 2 to 3 months. He is a chronic smoker which prompted a CT chest.  CT chest without contrast on 04/06/2020 showed no evidence of mediastinal or hilar adenopathy.  He was found to have 4 subcentimeter lung nodules 3 in the right lobe and one in the left upper lobe which were borderline for metastatic disease.  Cirrhotic liver morphology with 8.3 x 7 cm ill-defined hypoattenuating lesion in the left liver with a 3.3 cm lesion in the dome of the liver near the junction of the right and left hepatic lobes.  Labs showed elevated AST and ALT of 62 and 47 respectively with a normal albumin and normal total bilirubin.  CMP showed elevated H&H of 18.3/53.8.  Hepatitis C and HIV testing was negative. 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  Another mass in the segment 4A measuring 3.4 x 2.9 cm.  No evidence of local regional adenopathy.   Alpha-fetoprotein elevated at  38,000.  CA 19-9 elevated at 147.Patient's case discussed at tumor board and consensus was that this would be consistent with [facility] especially given the AFP value.  Initially MRI was not characteristic for [facility] but a biopsy was not deemed necessary after AFP results were back   Avastin Tecentriq started in March 2022.  He is also getting weekly phlebotomy for iron overload    Interval history-tolerating treatment well without any significant side effects.  He is concerned that Ativan is not working for his tremors.  ECOG PS- 1 Pain scale- 0   Review of systems- Review of Systems  Constitutional:  Positive for malaise/fatigue. Negative for chills, fever and weight loss.  HENT:  Negative for congestion, ear discharge and nosebleeds.   Eyes:  Negative for blurred vision.  Respiratory:  Negative for cough, hemoptysis, sputum production, shortness of breath and wheezing.   Cardiovascular:  Negative for chest pain, palpitations, orthopnea and claudication.  Gastrointestinal:  Negative for abdominal pain, blood in stool, constipation, diarrhea, heartburn, melena, nausea and vomiting.  Genitourinary:  Negative for dysuria, flank pain, frequency, hematuria and urgency.  Musculoskeletal:  Negative for back pain, joint pain and myalgias.  Skin:  Negative for rash.  Neurological:  Positive for tremors. Negative for dizziness, tingling, focal weakness, seizures, weakness and headaches.  Endo/Heme/Allergies:  Does not bruise/bleed easily.  Psychiatric/Behavioral:  Negative for depression and suicidal ideas. The patient does not have insomnia.       No Known Allergies   Past Medical History:  Diagnosis Date   Barrett esophagus  CAD (coronary artery disease)    Cancer (HCC)    liver cancer   Cirrhosis (HCC)    Heart disease    Hereditary hemochromatosis ([place])      Past Surgical History:  Procedure Laterality Date   CORONARY ARTERY BYPASS GRAFT     at 65 years old    ESOPHAGOGASTRODUODENOSCOPY (EGD) WITH PROPOFOL N/A 05/21/2020   Procedure: ESOPHAGOGASTRODUODENOSCOPY (EGD) WITH PROPOFOL;  Surgeon: Lin Landsman, MD;  Location: [place];  Service: Gastroenterology;  Laterality: N/A;   LAPAROSCOPIC LYSIS OF ADHESIONS  06/12/2020   Procedure: LAPAROSCOPIC LYSIS OF ADHESIONS;  Surgeon: Jules Husbands, MD;  Location: [facility] ORS;  Service: General;;   PORTA CATH INSERTION N/A 04/30/2020   Procedure: PORTA CATH INSERTION;  Surgeon: Algernon Huxley, MD;  Location: [place] CV LAB;  Service: Cardiovascular;  Laterality: N/A;   VEIN BYPASS SURGERY      Social History   Socioeconomic History   Marital status: Divorced    Spouse name: Not on file   Number of children: 2   Years of education: Not on file   Highest education level: Not on file  Occupational History   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[employer] Use   Smoking status: Some Days    Packs/day: 0.25    Types: Cigarettes   Smokeless tobacco: Never  Vaping Use   Vaping Use: Never used  Substance and Sexual Activity   Alcohol use: Not Currently   Drug use: Not Currently   Sexual activity: Not Currently  Other Topics Concern   Not on file  Social History Narrative   Lives by himself    Social Determinants of Health   Financial Resource Strain: Not on file  Food Insecurity: Not on file  Transportation Needs: Not on file  Physical Activity: Not on file  Stress: Not on file  Social Connections: Not on file  Intimate Partner Violence: Not on file    Family History  Problem Relation Age of Onset   Dementia Mother    Ulcers Father    Heart attack Brother    Throat cancer Brother    Heart attack Daughter    Diabetes Daughter    Depression Daughter      Current Outpatient Medications:    folic acid (FOLVITE) 1 MG tablet, Take 2 tablets (2 mg total) by mouth daily., Disp: 60 tablet, Rfl: 4   gabapentin (NEURONTIN) 300 MG capsule, Take 1 capsule (300 mg  total) by mouth at bedtime for 10 days, THEN 1 capsule (300 mg total) 2 (two) times daily for 10 days, THEN 1 capsule (300 mg total) 3 (three) times daily., Disp: 150 capsule, Rfl: 0   lidocaine-prilocaine (EMLA) cream, Apply to affected area once (Patient taking differently: Apply 1 application topically See admin instructions. Apply to affected area once as needed), Disp: 30 g, Rfl: 3   fluticasone (FLONASE) 50 MCG/ACT nasal spray, Place into both nostrils. (Patient not taking: No sig reported), Disp: , Rfl:    HYDROcodone-acetaminophen (NORCO/VICODIN) 5-325 MG tablet, Take 1 tablet by mouth every 6 (six) hours as needed for moderate pain or severe pain. (Patient not taking: No sig reported), Disp: 60 tablet, Rfl: 0   lactulose (CHRONULAC) 10 GM/15ML solution, Take 15 mLs (10 g total) by mouth daily. (Patient not taking: No sig reported), Disp: 236 mL, Rfl: 1   mirtazapine (REMERON) 15 MG tablet, Take 1 tablet (15 mg total) by mouth at bedtime. (Patient not  taking: No sig reported), Disp: 30 tablet, Rfl: 2   omeprazole (PRILOSEC) 40 MG capsule, Take 1 capsule by mouth every morning. (Patient not taking: No sig reported), Disp: , Rfl:    ondansetron (ZOFRAN) 8 MG tablet, Take 1 tablet (8 mg total) by mouth 2 (two) times daily as needed (Nausea or vomiting). (Patient not taking: No sig reported), Disp: 45 tablet, Rfl: 2   prochlorperazine (COMPAZINE) 10 MG tablet, Take 1 tablet (10 mg total) by mouth every 6 (six) hours as needed (Nausea or vomiting). (Patient not taking: No sig reported), Disp: 30 tablet, Rfl: 1  Physical exam:  Vitals:   12/28/20 0909  BP: 122/78  Pulse: 83  Resp: 16  Temp: 98 F (36.7 C)  SpO2: 100%  Weight: 144 lb 8 oz (65.5 kg)   Physical Exam Constitutional:      General: He is not in acute distress. Cardiovascular:     Rate and Rhythm: Normal rate and regular rhythm.     Heart sounds: Normal heart sounds.  Pulmonary:     Effort: Pulmonary effort is normal.      Breath sounds: Normal breath sounds.  Abdominal:     General: Bowel sounds are normal.     Palpations: Abdomen is soft.  Skin:    General: Skin is warm and dry.  Neurological:     Mental Status: He is alert and oriented to person, place, and time.     Comments: Resting tremor noted.  No evidence of past-pointing or dysdiadochokinesia     CMP Latest Ref Rng & Units 12/28/2020  Glucose 70 - 99 mg/dL 100(H)  BUN 8 - 23 mg/dL 13  Creatinine 0.61 - 1.24 mg/dL 0.81  Sodium 135 - 145 mmol/L 132(L)  Potassium 3.5 - 5.1 mmol/L 4.2  Chloride 98 - 111 mmol/L 100  CO2 22 - 32 mmol/L 25  Calcium 8.9 - 10.3 mg/dL 8.8(L)  Total Protein 6.5 - 8.1 g/dL 7.8  Total Bilirubin 0.3 - 1.2 mg/dL 0.5  Alkaline Phos 38 - 126 U/L 282(H)  AST 15 - 41 U/L 70(H)  ALT 0 - 44 U/L 53(H)   CBC Latest Ref Rng & Units 12/28/2020  WBC 4.0 - 10.5 K/uL 4.0  Hemoglobin 13.0 - 17.0 g/dL 13.6  Hematocrit 39.0 - 52.0 % 41.1  Platelets 150 - 400 K/uL 90(L)    Assessment and plan- Patient is a 65 y.o. male with locally advanced hepatocellular carcinoma here for on treatment assessment prior to cycle 12 of Avastin and Tecentriq  Counts okay to proceed with cycle 12 of Avastin and Tecentriq today.He has had a dramatic response to treatment so far and his AFP is now normal at 3.3.  Urine protein remains under +2.  Therefore okay to proceed with a Avastin and Tecentriq today.  Blood pressure is stable.  I will see him back in 3 weeks for cycle 13 of a Avastin and Tecentriq and we will get a CT chest abdomen pelvis with contrast prior.  Etiology of hepatocellular carcinoma secondary to hemochromatosis causing iron overload.  Patient is on weekly phlebotomy for this which we will continue.  Ferritin has come down to 515 and goal ferritin is less than 100.  We started off with a ferritin of greater than 7500.  Tremors: I will switch him from Ativan to gabapentin 300 mg at bedtime which she will slowly increase to 300 mg 3 times  daily.  Patient also has an appointment coming up with Dr. Mickeal Skinner from  neurology next month   Visit Diagnosis 1. Cancer, hepatocellular ([place])   2. Encounter for monoclonal antibody treatment for malignancy   3. Encounter for antineoplastic immunotherapy      Dr. Randa Evens, MD, MPH [facility] at [facility] [phone] 01/06/2021 8:22 AM

## 2021-01-09 ENCOUNTER — Inpatient Hospital Stay: Payer: 59 | Attending: Oncology

## 2021-01-09 ENCOUNTER — Other Ambulatory Visit: Payer: Self-pay

## 2021-01-09 DIAGNOSIS — Z79899 Other long term (current) drug therapy: Secondary | ICD-10-CM | POA: Diagnosis not present

## 2021-01-09 DIAGNOSIS — C220 Liver cell carcinoma: Secondary | ICD-10-CM | POA: Insufficient documentation

## 2021-01-09 DIAGNOSIS — Z5112 Encounter for antineoplastic immunotherapy: Secondary | ICD-10-CM | POA: Diagnosis not present

## 2021-01-09 LAB — HEMOGLOBIN AND HEMATOCRIT, BLOOD
HCT: 36.7 % — ABNORMAL LOW (ref 39.0–52.0)
Hemoglobin: 12 g/dL — ABNORMAL LOW (ref 13.0–17.0)

## 2021-01-09 MED ORDER — HEPARIN SOD (PORK) LOCK FLUSH 100 UNIT/ML IV SOLN
500.0000 [IU] | Freq: Once | INTRAVENOUS | Status: AC
  Administered 2021-01-09: 500 [IU] via INTRAVENOUS
  Filled 2021-01-09: qty 5

## 2021-01-09 MED ORDER — SODIUM CHLORIDE 0.9 % IV SOLN
Freq: Once | INTRAVENOUS | Status: AC
  Filled 2021-01-09: qty 250

## 2021-01-09 NOTE — Progress Notes (Signed)
500 ml phlebotomy performed. Pt tolerated procedure well. Rehydrated with 1L NS. VSS. Discharge to home.

## 2021-01-09 NOTE — Patient Instructions (Signed)

## 2021-01-18 ENCOUNTER — Inpatient Hospital Stay (HOSPITAL_BASED_OUTPATIENT_CLINIC_OR_DEPARTMENT_OTHER): Payer: 59 | Admitting: Oncology

## 2021-01-18 ENCOUNTER — Inpatient Hospital Stay: Payer: 59

## 2021-01-18 ENCOUNTER — Other Ambulatory Visit: Payer: Self-pay

## 2021-01-18 ENCOUNTER — Encounter: Payer: Self-pay | Admitting: Oncology

## 2021-01-18 ENCOUNTER — Inpatient Hospital Stay (HOSPITAL_BASED_OUTPATIENT_CLINIC_OR_DEPARTMENT_OTHER): Payer: 59 | Admitting: Internal Medicine

## 2021-01-18 ENCOUNTER — Ambulatory Visit: Payer: 59

## 2021-01-18 VITALS — BP 115/72 | HR 76 | Resp 16

## 2021-01-18 VITALS — BP 125/77 | HR 79 | Temp 98.7°F | Resp 18 | Ht 71.0 in | Wt 147.3 lb

## 2021-01-18 DIAGNOSIS — G20 Parkinson's disease: Secondary | ICD-10-CM | POA: Diagnosis not present

## 2021-01-18 DIAGNOSIS — Z5112 Encounter for antineoplastic immunotherapy: Secondary | ICD-10-CM | POA: Diagnosis not present

## 2021-01-18 DIAGNOSIS — R251 Tremor, unspecified: Secondary | ICD-10-CM | POA: Diagnosis not present

## 2021-01-18 DIAGNOSIS — C220 Liver cell carcinoma: Secondary | ICD-10-CM

## 2021-01-18 LAB — COMPREHENSIVE METABOLIC PANEL
ALT: 66 U/L — ABNORMAL HIGH (ref 0–44)
AST: 79 U/L — ABNORMAL HIGH (ref 15–41)
Albumin: 3.3 g/dL — ABNORMAL LOW (ref 3.5–5.0)
Alkaline Phosphatase: 388 U/L — ABNORMAL HIGH (ref 38–126)
Anion gap: 7 (ref 5–15)
BUN: 15 mg/dL (ref 8–23)
CO2: 27 mmol/L (ref 22–32)
Calcium: 8.8 mg/dL — ABNORMAL LOW (ref 8.9–10.3)
Chloride: 101 mmol/L (ref 98–111)
Creatinine, Ser: 0.77 mg/dL (ref 0.61–1.24)
GFR, Estimated: >60 mL/min (ref >60)
Glucose, Bld: 115 mg/dL — ABNORMAL HIGH (ref 70–99)
Potassium: 4.1 mmol/L (ref 3.5–5.1)
Sodium: 135 mmol/L (ref 135–145)
Total Bilirubin: 0.4 mg/dL (ref 0.3–1.2)
Total Protein: 7.8 g/dL (ref 6.5–8.1)

## 2021-01-18 LAB — CBC WITH DIFFERENTIAL/PLATELET
Abs Immature Granulocytes: 0.02 10*3/uL (ref 0.00–0.07)
Basophils Absolute: 0 10*3/uL (ref 0.0–0.1)
Basophils Relative: 1 %
Eosinophils Absolute: 0.1 10*3/uL (ref 0.0–0.5)
Eosinophils Relative: 1 %
HCT: 38.1 % — ABNORMAL LOW (ref 39.0–52.0)
Hemoglobin: 12.4 g/dL — ABNORMAL LOW (ref 13.0–17.0)
Immature Granulocytes: 0 %
Lymphocytes Relative: 20 %
Lymphs Abs: 1.1 10*3/uL (ref 0.7–4.0)
MCH: 31.7 pg (ref 26.0–34.0)
MCHC: 32.5 g/dL (ref 30.0–36.0)
MCV: 97.4 fL (ref 80.0–100.0)
Monocytes Absolute: 0.7 10*3/uL (ref 0.1–1.0)
Monocytes Relative: 13 %
Neutro Abs: 3.6 10*3/uL (ref 1.7–7.7)
Neutrophils Relative %: 65 %
Platelets: 122 10*3/uL — ABNORMAL LOW (ref 150–400)
RBC: 3.91 MIL/uL — ABNORMAL LOW (ref 4.22–5.81)
RDW: 13.6 % (ref 11.5–15.5)
WBC: 5.6 10*3/uL (ref 4.0–10.5)
nRBC: 0 % (ref 0.0–0.2)

## 2021-01-18 LAB — PROTEIN, URINE, RANDOM: Total Protein, Urine: 69 mg/dL

## 2021-01-18 LAB — FERRITIN: Ferritin: 422 ng/mL — ABNORMAL HIGH (ref 24–336)

## 2021-01-18 LAB — TSH: TSH: 1.688 u[IU]/mL (ref 0.350–4.500)

## 2021-01-18 MED ORDER — SODIUM CHLORIDE 0.9 % IV SOLN
Freq: Once | INTRAVENOUS | Status: AC
  Filled 2021-01-18: qty 250

## 2021-01-18 MED ORDER — SODIUM CHLORIDE 0.9% FLUSH
10.0000 mL | INTRAVENOUS | Status: DC | PRN
  Filled 2021-01-18: qty 10

## 2021-01-18 MED ORDER — SODIUM CHLORIDE 0.9 % IV SOLN
1200.0000 mg | Freq: Once | INTRAVENOUS | Status: AC
  Administered 2021-01-18: 1200 mg via INTRAVENOUS
  Filled 2021-01-18: qty 20

## 2021-01-18 MED ORDER — HEPARIN SOD (PORK) LOCK FLUSH 100 UNIT/ML IV SOLN
INTRAVENOUS | Status: AC
  Administered 2021-01-18: 500 [IU]
  Filled 2021-01-18: qty 5

## 2021-01-18 MED ORDER — SODIUM CHLORIDE 0.9 % IV SOLN
15.0000 mg/kg | Freq: Once | INTRAVENOUS | Status: AC
  Administered 2021-01-18: 900 mg via INTRAVENOUS
  Filled 2021-01-18: qty 32

## 2021-01-18 MED ORDER — HEPARIN SOD (PORK) LOCK FLUSH 100 UNIT/ML IV SOLN
500.0000 [IU] | Freq: Once | INTRAVENOUS | Status: AC | PRN
  Filled 2021-01-18: qty 5

## 2021-01-18 MED ORDER — SODIUM CHLORIDE 0.9 % IV SOLN
Freq: Once | INTRAVENOUS | Status: AC
  Administered 2021-01-18: 500 mL via INTRAVENOUS
  Filled 2021-01-18: qty 250

## 2021-01-18 NOTE — Progress Notes (Signed)
[facility] at [place], [place] [address] [phone]   New Patient Evaluation  Date of Service: 01/18/21 Patient Name: Maxwell Edwards Patient MRN: [id number] Patient DOB: 1955-06-05 Provider: Ventura Sellers, MD  Identifying Statement:  Maxwell Edwards is a 65 y.o. male with Parkinsonism, unspecified Parkinsonism type [facility]) who presents for initial consultation and evaluation regarding cancer associated neurologic deficits.    Referring Provider: Venita Lick, NP [address] [place],  [place] [address]  Primary Cancer:  Oncologic History: Oncology History  Cancer, hepatocellular ([place])  04/20/2020 Cancer Staging   Staging form: Liver, AJCC 8th Edition - Clinical stage from 04/20/2020: Stage IIIB (cT4, cN0, cM0) - Signed by Sindy Guadeloupe, MD on 04/21/2020    04/21/2020 Initial Diagnosis   Cancer, hepatocellular ([place])   05/10/2020 -  Chemotherapy   Patient is on Treatment Plan : Liver Atezolizumab + Bevacizumab q21d       History of Present Illness: The patient's records from the referring physician were obtained and reviewed and the patient interviewed to confirm this HPI.  Maxwell Edwards presents today with complaint of right hand  tremor.  He describes new onset shaking tremor of right hand, always while at rest, never with activity.  Onset is unclear, sometime in the past year or so; not before cancer diagnosis.  He denies any issues with cognition, gait, muscle tone, balance, but he does acknowledge that he's "a little slower" and lags behind people when walking in group compared to before.  No issues with sleep.  Continues on atezolizumab and avastin with Dr. Janese Banks for [facility].  Medications: Current Outpatient Medications on File Prior to Visit  Medication Sig Dispense Refill   fluticasone (FLONASE) 50 MCG/ACT nasal spray Place into both nostrils. (Patient not taking: No sig reported)     folic acid (FOLVITE) 1 MG tablet  Take 2 tablets (2 mg total) by mouth daily. 60 tablet 4   gabapentin (NEURONTIN) 300 MG capsule Take 1 capsule (300 mg total) by mouth at bedtime for 10 days, THEN 1 capsule (300 mg total) 2 (two) times daily for 10 days, THEN 1 capsule (300 mg total) 3 (three) times daily. (Patient not taking: Reported on 01/18/2021) 150 capsule 0   HYDROcodone-acetaminophen (NORCO/VICODIN) 5-325 MG tablet Take 1 tablet by mouth every 6 (six) hours as needed for moderate pain or severe pain. (Patient not taking: No sig reported) 60 tablet 0   lactulose (CHRONULAC) 10 GM/15ML solution Take 15 mLs (10 g total) by mouth daily. (Patient not taking: No sig reported) 236 mL 1   lidocaine-prilocaine (EMLA) cream Apply to affected area once (Patient not taking: Reported on 01/18/2021) 30 g 3   mirtazapine (REMERON) 15 MG tablet Take 1 tablet (15 mg total) by mouth at bedtime. (Patient not taking: No sig reported) 30 tablet 2   omeprazole (PRILOSEC) 40 MG capsule Take 1 capsule by mouth every morning. (Patient not taking: No sig reported)     ondansetron (ZOFRAN) 8 MG tablet Take 1 tablet (8 mg total) by mouth 2 (two) times daily as needed (Nausea or vomiting). (Patient not taking: No sig reported) 45 tablet 2   prochlorperazine (COMPAZINE) 10 MG tablet Take 1 tablet (10 mg total) by mouth every 6 (six) hours as needed (Nausea or vomiting). (Patient not taking: No sig reported) 30 tablet 1   No current facility-administered medications on file prior to visit.    Allergies: No Known Allergies Past  Medical History:  Past Medical History:  Diagnosis Date   Barrett esophagus    CAD (coronary artery disease)    Cancer ([place])    liver cancer   Cirrhosis ([place])    Heart disease    Hereditary hemochromatosis ([place])    Past Surgical History:  Past Surgical History:  Procedure Laterality Date   CORONARY ARTERY BYPASS GRAFT     at 65 years old   ESOPHAGOGASTRODUODENOSCOPY (EGD) WITH PROPOFOL N/A 05/21/2020   Procedure:  ESOPHAGOGASTRODUODENOSCOPY (EGD) WITH PROPOFOL;  Surgeon: Lin Landsman, MD;  Location: [place];  Service: Gastroenterology;  Laterality: N/A;   LAPAROSCOPIC LYSIS OF ADHESIONS  06/12/2020   Procedure: LAPAROSCOPIC LYSIS OF ADHESIONS;  Surgeon: Jules Husbands, MD;  Location: [facility] OR[facility];  Service: General;;   PORTA CATH INSERTION N/A 04/30/2020   Procedure: PORTA CATH INSERTION;  Surgeon: Algernon Huxley, MD;  Location: [place] CV LAB;  Service: Cardiovascular;  Laterality: N/A;   VEIN BYPASS SURGERY     Social History:  Social History   Socioeconomic History   Marital status: Divorced    Spouse name: Not on file   Number of children: 2   Years of education: Not on file   Highest education level: Not on file  Occupational History   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   Smoking status: Some Days    Packs/day: 0.25    Types: Cigarettes   Smokeless tobacco: Never  Vaping Use   Vaping Use: Never used  Substance and Sexual Activity   Alcohol use: Not Currently   Drug use: Not Currently   Sexual activity: Not Currently  Other Topics Concern   Not on file  Social History Narrative   Lives by himself    Social Determinants of Health   Financial Resource Strain: Not on file  Food Insecurity: Not on file  Transportation Needs: Not on file  Physical Activity: Not on file  Stress: Not on file  Social Connections: Not on file  Intimate Partner Violence: Not on file   Family History:  Family History  Problem Relation Age of Onset   Dementia Mother    Ulcers Father    Heart attack Brother    Throat cancer Brother    Heart attack Daughter    Diabetes Daughter    Depression Daughter     Review of Systems: Constitutional: Doesn't report fevers, chills or abnormal weight loss Eyes: Doesn't report blurriness of vision Ears, nose, mouth, throat, and face: Doesn't report sore throat Respiratory: Doesn't report cough, dyspnea or  wheezes Cardiovascular: Doesn't report palpitation, chest discomfort  Gastrointestinal:  Doesn't report nausea, constipation, diarrhea GU: Doesn't report incontinence Skin: Doesn't report skin rashes Neurological: Per HPI Musculoskeletal: Doesn't report joint pain Behavioral/Psych: Doesn't report anxiety  Physical Exam: Wt Readings from Last 3 Encounters:  01/18/21 147 lb 4.8 oz (66.8 kg)  12/28/20 144 lb 8 oz (65.5 kg)  12/07/20 146 lb 9.6 oz (66.5 kg)   Temp Readings from Last 3 Encounters:  01/18/21 98.7 F (37.1 C)  01/09/21 98.1 F (36.7 C) (Tympanic)  01/02/21 98.3 F (36.8 C) (Tympanic)   BP Readings from Last 3 Encounters:  01/18/21 125/77  01/09/21 120/69  01/02/21 103/69   Pulse Readings from Last 3 Encounters:  01/18/21 79  01/09/21 70  01/02/21 80   KPS: 80. General: Alert, cooperative, pleasant, in no acute distress Head: Normal EENT: No conjunctival injection or scleral icterus.  Lungs:  Resp effort normal Cardiac: Regular rate Abdomen: Non-distended abdomen Skin: No rashes cyanosis or petechiae. Extremities: No clubbing or edema  Neurologic Exam: Mental Status: Awake, alert, attentive to examiner. Oriented to self and environment. Language is fluent with intact comprehension.  Cranial Nerves: Visual acuity is grossly normal. Visual fields are full. Extra-ocular movements intact. No ptosis. Face is symmetric Motor: Tone and bulk are normal. Pill rolling rest tremor in right hand, improves with activity or posturing. Power is full in both arms and legs. Reflexes are symmetric, no pathologic reflexes present.  Sensory: Intact to light touch Gait: Decreased right arm swing, turns en bloc   Labs: I have reviewed the data as listed    Component Value Date/Time   NA 132 (L) 12/28/2020 0853   NA 135 04/06/2020 1152   K 4.2 12/28/2020 0853   CL 100 12/28/2020 0853   CO2 25 12/28/2020 0853   GLUCOSE 100 (H) 12/28/2020 0853   BUN 13 12/28/2020 0853    BUN 15 04/06/2020 1152   CREATININE 0.81 12/28/2020 0853   CALCIUM 8.8 (L) 12/28/2020 0853   PROT 7.8 12/28/2020 0853   PROT 8.2 04/06/2020 1152   ALBUMIN 3.6 12/28/2020 0853   ALBUMIN 4.3 04/06/2020 1152   AST 70 (H) 12/28/2020 0853   ALT 53 (H) 12/28/2020 0853   ALKPHOS 282 (H) 12/28/2020 0853   BILITOT 0.5 12/28/2020 0853   BILITOT 0.6 04/06/2020 1152   GFRNONAA >60 12/28/2020 0853   GFRAA 104 04/06/2020 1152   Lab Results  Component Value Date   WBC 5.6 01/18/2021   NEUTROABS 3.6 01/18/2021   HGB 12.4 (L) 01/18/2021   HCT 38.1 (L) 01/18/2021   MCV 97.4 01/18/2021   PLT 122 (L) 01/18/2021     Assessment/Plan Parkinsonism, unspecified Parkinsonism type ([place])  Maxwell Edwards presents with clinical syndrome consistent with parkinsonian tremor and gait.  He does not exhibit significant bradykinesia.    Etiology is either primary or secondary parkinsonism.  His medical history includes hemochromatosis and hepatocellular carcinoma, both of which could theoretically lead to structural brain disease.    We first recommended a brain MRI to rule out this involvement.    We will follow up with him following the imaging, either in person or virtually, to review further workup, or dopamine agonist therapy if indicated.  We spent twenty additional minutes teaching regarding the natural history, biology, and historical experience in the treatment of neurologic complications of cancer.   We appreciate the opportunity to participate in the care of Maxwell Edwards.   All questions were answered. The patient knows to call the clinic with any problems, questions or concerns. No barriers to learning were detected.  The total time spent in the encounter was 40 minutes and more than 50% was on counseling and review of test results   Ventura Sellers, MD Medical Director of Neuro-Oncology [facility] at [place] 01/18/21 9:41 AM

## 2021-01-18 NOTE — Progress Notes (Addendum)
Hematology/Oncology Consult note [facility]  [phone] [phone]  Patient Care Team: Venita Lick, NP as PCP - General (Nurse Practitioner) Clent Jacks, RN as Oncology Nurse Navigator Sindy Guadeloupe, MD as Consulting Physician (Hematology and Oncology)   Name of the patient: Maxwell Edwards  [id number]  07-08-1955   Date of visit: 01/18/21  Diagnosis- locally advanced unresectable hepatocellular carcinoma  Chief complaint/ Reason for visit-on treatment assessment prior to cycle 13 of a Avastin and Tecentriq  Heme/Onc history: patient is a 65 year old male with a past medical history significant for hypertension hyperlipidemia, 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  Reports that he has lost about 20 pounds over the last 2 to 3 months. He is a chronic smoker which prompted a CT chest.  CT chest without contrast on 04/06/2020 showed no evidence of mediastinal or hilar adenopathy.  He was found to have 4 subcentimeter lung nodules 3 in the right lobe and one in the left upper lobe which were borderline for metastatic disease.  Cirrhotic liver morphology with 8.3 x 7 cm ill-defined hypoattenuating lesion in the left liver with a 3.3 cm lesion in the dome of the liver near the junction of the right and left hepatic lobes.  Labs showed elevated AST and ALT of 62 and 47 respectively with a normal albumin and normal total bilirubin.  CMP showed elevated H&H of 18.3/53.8.  Hepatitis C and HIV testing was negative. 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  Another mass in the segment 4A measuring 3.4 x 2.9 cm.  No evidence of local regional adenopathy.   Alpha-fetoprotein elevated at  38,000.  CA 19-9 elevated at 147.Patient's case discussed at tumor board and consensus was that this would be consistent with [facility] especially given the AFP value.  Initially MRI was not characteristic for [facility] but a biopsy was not deemed necessary after AFP results were back   Avastin Tecentriq started in March 2022.  He is also getting weekly phlebotomy for iron overload    Interval history-patient is currently on gabapentin for his tremors which have not gotten better.  He was evaluated by neurology Dr. Mickeal Skinner today.  Otherwise he reports feeling well.  Energy levels continue to improve.  Denies any specific complaints at this time  ECOG PS- 1 Pain scale- 0   Review of systems- Review of Systems  Constitutional:  Positive for malaise/fatigue. Negative for chills, fever and weight loss.  HENT:  Negative for congestion, ear discharge and nosebleeds.   Eyes:  Negative for blurred vision.  Respiratory:  Negative for cough, hemoptysis, sputum production, shortness of breath and wheezing.   Cardiovascular:  Negative for chest pain, palpitations, orthopnea and claudication.  Gastrointestinal:  Negative for abdominal pain, blood in stool, constipation, diarrhea, heartburn, melena, nausea and vomiting.  Genitourinary:  Negative for dysuria, flank pain, frequency, hematuria and urgency.  Musculoskeletal:  Negative for back pain, joint pain and myalgias.  Skin:  Negative for rash.  Neurological:  Positive for tremors. Negative for dizziness, tingling, focal weakness, seizures, weakness and headaches.  Endo/Heme/Allergies:  Does not bruise/bleed easily.  Psychiatric/Behavioral:  Negative for depression and suicidal ideas. The patient does not have insomnia.  No Known Allergies   Past Medical History:  Diagnosis Date   Barrett esophagus    CAD (coronary artery disease)    Cancer (HCC)    liver cancer   Cirrhosis ([place])    Heart disease    Hereditary hemochromatosis ([place])      Past  Surgical History:  Procedure Laterality Date   CORONARY ARTERY BYPASS GRAFT     at 65 years old   ESOPHAGOGASTRODUODENOSCOPY (EGD) WITH PROPOFOL N/A 05/21/2020   Procedure: ESOPHAGOGASTRODUODENOSCOPY (EGD) WITH PROPOFOL;  Surgeon: Lin Landsman, MD;  Location: [facility];  Service: Gastroenterology;  Laterality: N/A;   LAPAROSCOPIC LYSIS OF ADHESIONS  06/12/2020   Procedure: LAPAROSCOPIC LYSIS OF ADHESIONS;  Surgeon: Jules Husbands, MD;  Location: [facility];  Service: General;;   PORTA CATH INSERTION N/A 04/30/2020   Procedure: PORTA CATH INSERTION;  Surgeon: Algernon Huxley, MD;  Location: [facility];  Service: Cardiovascular;  Laterality: N/A;   VEIN BYPASS SURGERY      Social History   Socioeconomic History   Marital status: Divorced    Spouse name: Not on file   Number of children: 2   Years of education: Not on file   Highest education level: Not on file  Occupational History   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[employer] Use   Smoking status: Some Days    Packs/day: 0.25    Types: Cigarettes   Smokeless tobacco: Never  Vaping Use   Vaping Use: Never used  Substance and Sexual Activity   Alcohol use: Not Currently   Drug use: Not Currently   Sexual activity: Not Currently  Other Topics Concern   Not on file  Social History Narrative   Lives by himself    Social Determinants of Health   Financial Resource Strain: Not on file  Food Insecurity: Not on file  Transportation Needs: Not on file  Physical Activity: Not on file  Stress: Not on file  Social Connections: Not on file  Intimate Partner Violence: Not on file    Family History  Problem Relation Age of Onset   Dementia Mother    Ulcers Father    Heart attack Brother    Throat cancer Brother    Heart attack Daughter    Diabetes Daughter    Depression Daughter      Current Outpatient Medications:    folic acid (FOLVITE) 1 MG tablet, Take 2 tablets (2 mg total) by mouth  daily., Disp: 60 tablet, Rfl: 4   fluticasone (FLONASE) 50 MCG/ACT nasal spray, Place into both nostrils. (Patient not taking: No sig reported), Disp: , Rfl:    gabapentin (NEURONTIN) 300 MG capsule, Take 1 capsule (300 mg total) by mouth at bedtime for 10 days, THEN 1 capsule (300 mg total) 2 (two) times daily for 10 days, THEN 1 capsule (300 mg total) 3 (three) times daily. (Patient not taking: Reported on 01/18/2021), Disp: 150 capsule, Rfl: 0   HYDROcodone-acetaminophen (NORCO/VICODIN) 5-325 MG tablet, Take 1 tablet by mouth every 6 (six) hours as needed for moderate pain or severe pain. (Patient not taking: No sig reported), Disp: 60 tablet, Rfl: 0   lactulose (CHRONULAC) 10 GM/15ML solution, Take 15 mLs (10 g total) by mouth daily. (Patient not taking: No sig reported), Disp: 236 mL, Rfl: 1   lidocaine-prilocaine (EMLA) cream, Apply to affected area once (Patient not taking: Reported on 01/18/2021), Disp: 30 g, Rfl: 3   mirtazapine (REMERON) 15 MG  tablet, Take 1 tablet (15 mg total) by mouth at bedtime. (Patient not taking: No sig reported), Disp: 30 tablet, Rfl: 2   omeprazole (PRILOSEC) 40 MG capsule, Take 1 capsule by mouth every morning. (Patient not taking: No sig reported), Disp: , Rfl:    ondansetron (ZOFRAN) 8 MG tablet, Take 1 tablet (8 mg total) by mouth 2 (two) times daily as needed (Nausea or vomiting). (Patient not taking: No sig reported), Disp: 45 tablet, Rfl: 2   prochlorperazine (COMPAZINE) 10 MG tablet, Take 1 tablet (10 mg total) by mouth every 6 (six) hours as needed (Nausea or vomiting). (Patient not taking: No sig reported), Disp: 30 tablet, Rfl: 1 No current facility-administered medications for this visit.  Facility-Administered Medications Ordered in Other Visits:    sodium chloride flush (NS) 0.9 % injection 10 mL, 10 mL, Intracatheter, PRN, Sindy Guadeloupe, MD  Physical exam:  Vitals:   01/18/21 0936  BP: 125/77  Pulse: 79  Resp: 18  Temp: 98.7 F (37.1 C)   SpO2: 100%  Weight: 147 lb 4.8 oz (66.8 kg)  Height: 5\' 11"  (1.803 m)   Physical Exam Cardiovascular:     Rate and Rhythm: Normal rate and regular rhythm.     Heart sounds: Normal heart sounds.  Pulmonary:     Effort: Pulmonary effort is normal.     Breath sounds: Normal breath sounds.  Abdominal:     General: Bowel sounds are normal.     Palpations: Abdomen is soft.  Skin:    General: Skin is warm and dry.  Neurological:     Mental Status: He is alert and oriented to person, place, and time.     Comments: Resting tremor     CMP Latest Ref Rng & Units 01/18/2021  Glucose 70 - 99 mg/dL 115(H)  BUN 8 - 23 mg/dL 15  Creatinine 0.61 - 1.24 mg/dL 0.77  Sodium 135 - 145 mmol/L 135  Potassium 3.5 - 5.1 mmol/L 4.1  Chloride 98 - 111 mmol/L 101  CO2 22 - 32 mmol/L 27  Calcium 8.9 - 10.3 mg/dL 8.8(L)  Total Protein 6.5 - 8.1 g/dL 7.8  Total Bilirubin 0.3 - 1.2 mg/dL 0.4  Alkaline Phos 38 - 126 U/L 388(H)  AST 15 - 41 U/L 79(H)  ALT 0 - 44 U/L 66(H)   CBC Latest Ref Rng & Units 01/18/2021  WBC 4.0 - 10.5 K/uL 5.6  Hemoglobin 13.0 - 17.0 g/dL 12.4(L)  Hematocrit 39.0 - 52.0 % 38.1(L)  Platelets 150 - 400 K/uL 122(L)     Assessment and plan- Patient is a 65 y.o. male with locally advanced hepatocellular carcinoma here for on treatment assessment prior to cycle13 Avastin and Tecentriq  Counts will be to proceed with cycle 13 of a Avastin and Tecentriq today.  I will see him back in 3 weeks for cycle 14 and he will get a CT chest abdomen and pelvis with contrast prior.  AFP from today is pending but prior AFP from 3 weeks ago had normalized to 5 from baseline value of 67,000.  TSH is normal.  Urine protein is +1Continue to monitor  Hemochromatosis: Continue phlebotomy today and on a weekly basis ferritin levels are down to 422 with a goal ferritin of less than 50  Resting tremor: Evaluated by neurology Dr. Mickeal Skinner today and an MRI brain has been ordered  Abnormal LFTs: Secondary  to iron overload.  Bilirubin has normalized and overall AST and ALT is also improving   Visit  Diagnosis 1. Cancer, hepatocellular ([place])   2. Hereditary hemochromatosis ([place])   3. Encounter for monoclonal antibody treatment for malignancy   4. Encounter for antineoplastic immunotherapy   5. Tremor   6. Iron overload      Dr. Randa Evens, MD, MPH [facility] at [facility] [phone] 01/18/2021 1:33 PM

## 2021-01-18 NOTE — Progress Notes (Signed)
Collect TSH and urine protein today but proceed with treatment today per Dr. Janese Banks. Phlebotomy today per Dr. Janese Banks. Labs collected per orders, proceeded with 580ml phlebotomy with 562ml fluid replacement. Proceeded with Avastin/Tecentriq treatment.

## 2021-01-18 NOTE — Patient Instructions (Signed)
[facility] ONCOLOGY  Discharge Instructions: Thank you for choosing [facility] to provide your oncology and hematology care.  If you have a lab appointment with the [facility], please go directly to the [facility] and check in at the registration area.  Wear comfortable clothing and clothing appropriate for easy access to any Portacath or PICC line.   We strive to give you quality time with your provider. You may need to reschedule your appointment if you arrive late (15 or more minutes).  Arriving late affects you and other patients whose appointments are after yours.  Also, if you miss three or more appointments without notifying the office, you may be dismissed from the clinic at the provider's discretion.      For prescription refill requests, have your pharmacy contact our office and allow 72 hours for refills to be completed.    Today you received the following chemotherapy and/or immunotherapy agents - avastin, tecentriq      To help prevent nausea and vomiting after your treatment, we encourage you to take your nausea medication as directed.  BELOW ARE SYMPTOMS THAT SHOULD BE REPORTED IMMEDIATELY: *FEVER GREATER THAN 100.4 F (38 C) OR HIGHER *CHILLS OR SWEATING *NAUSEA AND VOMITING THAT IS NOT CONTROLLED WITH YOUR NAUSEA MEDICATION *UNUSUAL SHORTNESS OF BREATH *UNUSUAL BRUISING OR BLEEDING *URINARY PROBLEMS (pain or burning when urinating, or frequent urination) *BOWEL PROBLEMS (unusual diarrhea, constipation, pain near the anus) TENDERNESS IN MOUTH AND THROAT WITH OR WITHOUT PRESENCE OF ULCERS (sore throat, sores in mouth, or a toothache) UNUSUAL RASH, SWELLING OR PAIN  UNUSUAL VAGINAL DISCHARGE OR ITCHING   Items with * indicate a potential emergency and should be followed up as soon as possible or go to the Emergency Department if any problems should occur.  Please show the CHEMOTHERAPY ALERT CARD or IMMUNOTHERAPY ALERT CARD at  check-in to the Emergency Department and triage nurse.  Should you have questions after your visit or need to cancel or reschedule your appointment, please contact [facility]  [phone] and follow the prompts.  Office hours are 8:00 a.m. to 4:30 p.m. Monday - Friday. Please note that voicemails left after 4:00 p.m. may not be returned until the following business day.  We are closed weekends and major holidays. You have access to a nurse at all times for urgent questions. Please call the main number to the clinic [phone] and follow the prompts.  For any non-urgent questions, you may also contact your provider using MyChart. We now offer e-Visits for anyone 42 and older to request care online for non-urgent symptoms. For details visit [email or web address].   Also download the MyChart app! Go to the app store, search "MyChart", open the app, select [facility], and log in with your MyChart username and password.  Due to Covid, a mask is required upon entering the hospital/clinic. If you do not have a mask, one will be given to you upon arrival. For doctor visits, patients may have 1 support person aged 37 or older with them. For treatment visits, patients cannot have anyone with them due to current Covid guidelines and our immunocompromised population.   Bevacizumab injection What is this medication? BEVACIZUMAB (be va SIZ yoo mab) is a monoclonal antibody. It is used to treat many types of cancer. This medicine may be used for other purposes; ask your health care provider or pharmacist if you have questions. COMMON BRAND NAME(S): Alymsys, Avastin, MVASI, Zirabev What should  I tell my care team before I take this medication? They need to know if you have any of these conditions: diabetes heart disease high blood pressure history of coughing up blood prior anthracycline chemotherapy (e.g., doxorubicin, daunorubicin, epirubicin) recent or ongoing  radiation therapy recent or planning to have surgery stroke an unusual or allergic reaction to bevacizumab, hamster proteins, mouse proteins, other medicines, foods, dyes, or preservatives pregnant or trying to get pregnant breast-feeding How should I use this medication? This medicine is for infusion into a vein. It is given by a health care professional in a hospital or clinic setting. Talk to your pediatrician regarding the use of this medicine in children. Special care may be needed. Overdosage: If you think you have taken too much of this medicine contact a poison control center or emergency room at once. NOTE: This medicine is only for you. Do not share this medicine with others. What if I miss a dose? It is important not to miss your dose. Call your doctor or health care professional if you are unable to keep an appointment. What may interact with this medication? Interactions are not expected. This list may not describe all possible interactions. Give your health care provider a list of all the medicines, herbs, non-prescription drugs, or dietary supplements you use. Also tell them if you smoke, drink alcohol, or use illegal drugs. Some items may interact with your medicine. What should I watch for while using this medication? Your condition will be monitored carefully while you are receiving this medicine. You will need important blood work and urine testing done while you are taking this medicine. This medicine may increase your risk to bruise or bleed. Call your doctor or health care professional if you notice any unusual bleeding. Before having surgery, talk to your health care provider to make sure it is ok. This drug can increase the risk of poor healing of your surgical site or wound. You will need to stop this drug for 28 days before surgery. After surgery, wait at least 28 days before restarting this drug. Make sure the surgical site or wound is healed enough before restarting  this drug. Talk to your health care provider if questions. Do not become pregnant while taking this medicine or for 6 months after stopping it. Women should inform their doctor if they wish to become pregnant or think they might be pregnant. There is a potential for serious side effects to an unborn child. Talk to your health care professional or pharmacist for more information. Do not breast-feed an infant while taking this medicine and for 6 months after the last dose. This medicine has caused ovarian failure in some women. This medicine may interfere with the ability to have a child. You should talk to your doctor or health care professional if you are concerned about your fertility. What side effects may I notice from receiving this medication? Side effects that you should report to your doctor or health care professional as soon as possible: allergic reactions like skin rash, itching or hives, swelling of the face, lips, or tongue chest pain or chest tightness chills coughing up blood high fever seizures severe constipation signs and symptoms of bleeding such as bloody or black, tarry stools; red or dark-brown urine; spitting up blood or brown material that looks like coffee grounds; red spots on the skin; unusual bruising or bleeding from the eye, gums, or nose signs and symptoms of a blood clot such as breathing problems; chest pain; severe, sudden  headache; pain, swelling, warmth in the leg signs and symptoms of a stroke like changes in vision; confusion; trouble speaking or understanding; severe headaches; sudden numbness or weakness of the face, arm or leg; trouble walking; dizziness; loss of balance or coordination stomach pain sweating swelling of legs or ankles vomiting weight gain Side effects that usually do not require medical attention (report to your doctor or health care professional if they continue or are bothersome): back pain changes in taste decreased appetite dry  skin nausea tiredness This list may not describe all possible side effects. Call your doctor for medical advice about side effects. You may report side effects to FDA at 1-800-FDA-1088. Where should I keep my medication? This drug is given in a hospital or clinic and will not be stored at home. NOTE: This sheet is a summary. It may not cover all possible information. If you have questions about this medicine, talk to your doctor, pharmacist, or health care provider.  2022 Elsevier/Gold Standard (2020-11-13 00:00:00)  Atezolizumab injection What is this medication? ATEZOLIZUMAB (a te zoe LIZ ue mab) is a monoclonal antibody. It is used to treat bladder cancer (urothelial cancer), liver cancer, lung cancer, and melanoma. This medicine may be used for other purposes; ask your health care provider or pharmacist if you have questions. COMMON BRAND NAME(S): Tecentriq What should I tell my care team before I take this medication? They need to know if you have any of these conditions: autoimmune diseases like Crohn's disease, ulcerative colitis, or lupus have had or planning to have an allogeneic stem cell transplant (uses someone else's stem cells) history of organ transplant history of radiation to the chest nervous system problems like myasthenia gravis or Guillain-Barre syndrome an unusual or allergic reaction to atezolizumab, other medicines, foods, dyes, or preservatives pregnant or trying to get pregnant breast-feeding How should I use this medication? This medicine is for infusion into a vein. It is given by a health care professional in a hospital or clinic setting. A special MedGuide will be given to you before each treatment. Be sure to read this information carefully each time. Talk to your pediatrician regarding the use of this medicine in children. Special care may be needed. Overdosage: If you think you have taken too much of this medicine contact a poison control center or  emergency room at once. NOTE: This medicine is only for you. Do not share this medicine with others. What if I miss a dose? It is important not to miss your dose. Call your doctor or health care professional if you are unable to keep an appointment. What may interact with this medication? Interactions have not been studied. This list may not describe all possible interactions. Give your health care provider a list of all the medicines, herbs, non-prescription drugs, or dietary supplements you use. Also tell them if you smoke, drink alcohol, or use illegal drugs. Some items may interact with your medicine. What should I watch for while using this medication? Your condition will be monitored carefully while you are receiving this medicine. You may need blood work done while you are taking this medicine. Do not become pregnant while taking this medicine or for at least 5 months after stopping it. Women should inform their doctor if they wish to become pregnant or think they might be pregnant. There is a potential for serious side effects to an unborn child. Talk to your health care professional or pharmacist for more information. Do not breast-feed an infant while taking  this medicine or for at least 5 months after the last dose. What side effects may I notice from receiving this medication? Side effects that you should report to your doctor or health care professional as soon as possible: allergic reactions like skin rash, itching or hives, swelling of the face, lips, or tongue black, tarry stools bloody or watery diarrhea breathing problems changes in vision chest pain or chest tightness chills facial flushing fever headache signs and symptoms of high blood sugar such as dizziness; dry mouth; dry skin; fruity breath; nausea; stomach pain; increased hunger or thirst; increased urination signs and symptoms of liver injury like dark yellow or brown urine; general ill feeling or flu-like symptoms;  light-colored stools; loss of appetite; nausea; right upper belly pain; unusually weak or tired; yellowing of the eyes or skin stomach pain trouble passing urine or change in the amount of urine Side effects that usually do not require medical attention (report to your doctor or health care professional if they continue or are bothersome): bone pain cough diarrhea joint pain muscle pain muscle weakness swelling of arms or legs tiredness weight loss This list may not describe all possible side effects. Call your doctor for medical advice about side effects. You may report side effects to FDA at 1-800-FDA-1088. Where should I keep my medication? This drug is given in a hospital or clinic and will not be stored at home. NOTE: This sheet is a summary. It may not cover all possible information. If you have questions about this medicine, talk to your doctor, pharmacist, or health care provider.  2022 Elsevier/Gold Standard (2020-11-13 00:00:00)

## 2021-01-18 NOTE — Progress Notes (Signed)
Keep on current dose of Avastin this week and update per weight for next visit per MD

## 2021-01-19 LAB — AFP TUMOR MARKER: AFP, Serum, Tumor Marker: 4.4 ng/mL (ref 0.0–8.4)

## 2021-01-24 ENCOUNTER — Other Ambulatory Visit: Payer: Self-pay

## 2021-01-25 ENCOUNTER — Inpatient Hospital Stay: Payer: 59

## 2021-01-25 ENCOUNTER — Other Ambulatory Visit: Payer: Self-pay

## 2021-01-25 DIAGNOSIS — Z5112 Encounter for antineoplastic immunotherapy: Secondary | ICD-10-CM | POA: Diagnosis not present

## 2021-01-25 DIAGNOSIS — C220 Liver cell carcinoma: Secondary | ICD-10-CM

## 2021-01-25 LAB — HEMOGLOBIN AND HEMATOCRIT, BLOOD
HCT: 38.1 % — ABNORMAL LOW (ref 39.0–52.0)
Hemoglobin: 12.4 g/dL — ABNORMAL LOW (ref 13.0–17.0)

## 2021-01-25 MED ORDER — HEPARIN SOD (PORK) LOCK FLUSH 100 UNIT/ML IV SOLN
500.0000 [IU] | Freq: Once | INTRAVENOUS | Status: AC
  Administered 2021-01-25: 500 [IU] via INTRAVENOUS
  Filled 2021-01-25: qty 5

## 2021-01-25 MED ORDER — SODIUM CHLORIDE 0.9 % IV SOLN
Freq: Once | INTRAVENOUS | Status: AC
  Filled 2021-01-25: qty 250

## 2021-01-25 MED ORDER — SODIUM CHLORIDE 0.9% FLUSH
10.0000 mL | Freq: Once | INTRAVENOUS | Status: AC
  Administered 2021-01-25: 10 mL via INTRAVENOUS
  Filled 2021-01-25: qty 10

## 2021-01-25 NOTE — Patient Instructions (Signed)

## 2021-01-25 NOTE — Progress Notes (Signed)
Therapeutic phlebotomy performed. Removed 500 ml blood from R AC #20 angiocath. Patient tolerated well.1 L NS fluid replacement.

## 2021-01-26 ENCOUNTER — Ambulatory Visit
Admission: RE | Admit: 2021-01-26 | Discharge: 2021-01-26 | Disposition: A | Discharge status: Home / Self Care | Payer: 59 | Source: Ambulatory Visit | Attending: Internal Medicine | Admitting: Internal Medicine

## 2021-01-26 DIAGNOSIS — G20 Parkinson's disease: Secondary | ICD-10-CM | POA: Diagnosis present

## 2021-01-26 MED ORDER — GADOBUTROL 1 MMOL/ML IV SOLN
6.0000 mL | Freq: Once | INTRAVENOUS | Status: AC | PRN
  Administered 2021-01-26: 7.5 mL via INTRAVENOUS

## 2021-01-28 ENCOUNTER — Inpatient Hospital Stay: Payer: 59 | Attending: Internal Medicine | Admitting: Internal Medicine

## 2021-01-28 ENCOUNTER — Ambulatory Visit
Admission: RE | Admit: 2021-01-28 | Discharge: 2021-01-28 | Disposition: A | Discharge status: Home / Self Care | Payer: 59 | Source: Ambulatory Visit | Attending: Oncology | Admitting: Oncology

## 2021-01-28 ENCOUNTER — Other Ambulatory Visit: Payer: Self-pay

## 2021-01-28 DIAGNOSIS — G20 Parkinson's disease: Secondary | ICD-10-CM

## 2021-01-28 DIAGNOSIS — C220 Liver cell carcinoma: Secondary | ICD-10-CM | POA: Insufficient documentation

## 2021-01-28 MED ORDER — IOHEXOL 300 MG/ML  SOLN
100.0000 mL | Freq: Once | INTRAMUSCULAR | Status: AC | PRN
  Administered 2021-01-28: 100 mL via INTRAVENOUS

## 2021-01-28 NOTE — Progress Notes (Signed)
I connected with Maxwell Edwards on 01/28/21 at 11:30 AM EST by telephone visit and verified that I am speaking with the correct person using two identifiers.  I discussed the limitations, risks, security and privacy concerns of performing an evaluation and management service by telemedicine and the availability of in-person appointments. I also discussed with the patient that there may be a patient responsible charge related to this service. The patient expressed understanding and agreed to proceed.  Other persons participating in the visit and their role in the encounter:  spouse  Patient's location:  Home  Provider's location:  Office  Chief Complaint:  Parkinsonism, unspecified Parkinsonism type ([place])  History of Present Ilness: Maxwell Edwards describes no significant changes in tremor, balance symptoms since our visit.  No issues with the brain MRI.  Gabapentin had been prescribed by Dr. Janese Banks, has not been helpful.   Observations: Language, cognition at baseline  Imaging:  [facility] Interpretation: I have personally reviewed the CNS images as listed.  My interpretation, in the context of the patient's clinical presentation, is  old infarcts  MR BRAIN W WO CONTRAST  Result Date: 01/28/2021 CLINICAL DATA:  Brain/CNS neoplasm, assess treatment response; hepatocellular carcinoma with Parkinson's EXAM: MRI HEAD WITHOUT AND WITH CONTRAST TECHNIQUE: Multiplanar, multiecho pulse sequences of the brain and surrounding structures were obtained without and with intravenous contrast. CONTRAST:  7.80mL GADAVIST GADOBUTROL 1 MMOL/ML IV SOLN COMPARISON:  None. FINDINGS: Brain: There is no acute infarction or intracranial hemorrhage. There is no intracranial mass, mass effect, or edema. There is no hydrocephalus or extra-axial fluid collection. Ventricles and sulci are within normal limits in size and configuration. Patchy T2 hyperintensity in the supratentorial white matter is nonspecific but may reflect  mild chronic microvascular ischemic changes. There are chronic small vessel infarcts of the right thalamus, bilateral basal ganglia, and left internal capsule. No abnormal enhancement. Vascular: Major vessel flow voids at the skull base are preserved. Skull and upper cervical spine: Normal marrow signal is preserved. Sinuses/Orbits: Mild mucosal thickening.  Orbits are unremarkable. Other: Sella is unremarkable.  Mastoid air cells are clear. IMPRESSION: No evidence of intracranial metastatic disease. Chronic small vessel infarcts and chronic microvascular ischemic changes. Electronically Signed   By: Macy Mis M.D.   On: 01/28/2021 08:56    Assessment and Plan: Parkinsonism, unspecified Parkinsonism type ([place])  Clinically unchanged today.  MRI brings up possibility of vascular parkinsons as etiology or contributing factor, especially with lateralizing correlation.  He does have known history of stroke from "years ago" but has not had follow up.    For small vessel stroke, we recommended resumption of ASA 81mg  daily which had been tolerated previously.  It's unclear why this was stopped.  CBC was reviewed today.  For parkinsonism, recommended trial and titration of Ropinorole.  We reviewed side effects including nausea, impulsive behavior, orthostasis. Titration is as follows: -0.25mg  TID x1 day, then 0.5mg  TID x1 day, then 0.75mg  TID x1 day -Then 1mg  TID x7 days -Then 2mg  TID x7 days  Follow Up Instructions: Follow up in clinic in 2 weeks for further evaluation, med titration  I discussed the assessment and treatment plan with the patient.  The patient was provided an opportunity to ask questions and all were answered.  The patient agreed with the plan and demonstrated understanding of the instructions.    The patient was advised to call back or seek an in-person evaluation if the symptoms worsen or if the condition fails to improve as anticipated.  I provided 5-10 minutes of non-face-to-face  time during this enocunter.  Ventura Sellers, MD   I provided 22 minutes of non face-to-face telephone visit time during this encounter, and > 50% was spent counseling as documented under my assessment & plan.

## 2021-01-30 ENCOUNTER — Inpatient Hospital Stay: Payer: 59

## 2021-01-30 ENCOUNTER — Other Ambulatory Visit: Payer: Self-pay

## 2021-01-30 DIAGNOSIS — C220 Liver cell carcinoma: Secondary | ICD-10-CM

## 2021-01-30 DIAGNOSIS — Z5112 Encounter for antineoplastic immunotherapy: Secondary | ICD-10-CM | POA: Diagnosis not present

## 2021-01-30 LAB — HEMOGLOBIN AND HEMATOCRIT, BLOOD
HCT: 36.9 % — ABNORMAL LOW (ref 39.0–52.0)
Hemoglobin: 11.8 g/dL — ABNORMAL LOW (ref 13.0–17.0)

## 2021-01-30 MED ORDER — HEPARIN SOD (PORK) LOCK FLUSH 100 UNIT/ML IV SOLN
500.0000 [IU] | Freq: Once | INTRAVENOUS | Status: AC
  Administered 2021-01-30: 500 [IU] via INTRAVENOUS
  Filled 2021-01-30: qty 5

## 2021-01-30 MED ORDER — SODIUM CHLORIDE 0.9% FLUSH
10.0000 mL | Freq: Once | INTRAVENOUS | Status: AC
  Administered 2021-01-30: 10 mL via INTRAVENOUS
  Filled 2021-01-30: qty 10

## 2021-01-30 MED ORDER — SODIUM CHLORIDE 0.9 % IV SOLN
Freq: Once | INTRAVENOUS | Status: AC
  Filled 2021-01-30: qty 250

## 2021-01-30 NOTE — Progress Notes (Signed)
Hgb 11.8 today. Per parameters given to me by Dr Janese Banks we are to hold phlebotomy if hgb less than 11.5. Patient denies any weakness at this time.I will proceed with 500 ml phlebotomy today. Pt will receive 1 L NS replacement fluids. VSS. Discharged to home.

## 2021-02-01 ENCOUNTER — Encounter: Payer: Self-pay | Admitting: Internal Medicine

## 2021-02-04 ENCOUNTER — Other Ambulatory Visit: Payer: Self-pay | Admitting: Internal Medicine

## 2021-02-04 ENCOUNTER — Encounter: Payer: Self-pay | Admitting: *Deleted

## 2021-02-04 MED ORDER — ROPINIROLE HCL 0.25 MG PO TABS: ORAL_TABLET | ORAL | 0 refills | Status: DC

## 2021-02-04 MED ORDER — ROPINIROLE HCL 1 MG PO TABS: ORAL_TABLET | ORAL | 1 refills | Status: DC

## 2021-02-05 ENCOUNTER — Other Ambulatory Visit: Payer: Self-pay | Admitting: *Deleted

## 2021-02-05 ENCOUNTER — Encounter: Payer: Self-pay | Admitting: Oncology

## 2021-02-05 DIAGNOSIS — C220 Liver cell carcinoma: Secondary | ICD-10-CM

## 2021-02-08 ENCOUNTER — Inpatient Hospital Stay: Payer: 59 | Attending: Internal Medicine

## 2021-02-08 ENCOUNTER — Inpatient Hospital Stay: Payer: 59

## 2021-02-08 ENCOUNTER — Inpatient Hospital Stay (HOSPITAL_BASED_OUTPATIENT_CLINIC_OR_DEPARTMENT_OTHER): Payer: 59 | Admitting: Internal Medicine

## 2021-02-08 ENCOUNTER — Other Ambulatory Visit: Payer: Self-pay

## 2021-02-08 ENCOUNTER — Inpatient Hospital Stay (HOSPITAL_BASED_OUTPATIENT_CLINIC_OR_DEPARTMENT_OTHER): Payer: 59 | Admitting: Oncology

## 2021-02-08 VITALS — BP 131/82 | HR 81 | Temp 97.2°F | Resp 18 | Wt 146.0 lb

## 2021-02-08 VITALS — BP 144/80 | HR 76 | Resp 18

## 2021-02-08 DIAGNOSIS — C220 Liver cell carcinoma: Secondary | ICD-10-CM | POA: Diagnosis not present

## 2021-02-08 DIAGNOSIS — G20 Parkinson's disease: Secondary | ICD-10-CM | POA: Diagnosis not present

## 2021-02-08 DIAGNOSIS — E538 Deficiency of other specified B group vitamins: Secondary | ICD-10-CM

## 2021-02-08 DIAGNOSIS — Z5112 Encounter for antineoplastic immunotherapy: Secondary | ICD-10-CM | POA: Diagnosis not present

## 2021-02-08 DIAGNOSIS — Z79899 Other long term (current) drug therapy: Secondary | ICD-10-CM | POA: Diagnosis not present

## 2021-02-08 DIAGNOSIS — R7989 Other specified abnormal findings of blood chemistry: Secondary | ICD-10-CM | POA: Diagnosis not present

## 2021-02-08 LAB — CBC WITH DIFFERENTIAL/PLATELET
Abs Immature Granulocytes: 0.01 10*3/uL (ref 0.00–0.07)
Basophils Absolute: 0.1 10*3/uL (ref 0.0–0.1)
Basophils Relative: 1 %
Eosinophils Absolute: 0.1 10*3/uL (ref 0.0–0.5)
Eosinophils Relative: 1 %
HCT: 36.7 % — ABNORMAL LOW (ref 39.0–52.0)
Hemoglobin: 11.8 g/dL — ABNORMAL LOW (ref 13.0–17.0)
Immature Granulocytes: 0 %
Lymphocytes Relative: 19 %
Lymphs Abs: 1 10*3/uL (ref 0.7–4.0)
MCH: 30.2 pg (ref 26.0–34.0)
MCHC: 32.2 g/dL (ref 30.0–36.0)
MCV: 93.9 fL (ref 80.0–100.0)
Monocytes Absolute: 0.5 10*3/uL (ref 0.1–1.0)
Monocytes Relative: 10 %
Neutro Abs: 3.6 10*3/uL (ref 1.7–7.7)
Neutrophils Relative %: 69 %
Platelets: 109 10*3/uL — ABNORMAL LOW (ref 150–400)
RBC: 3.91 MIL/uL — ABNORMAL LOW (ref 4.22–5.81)
RDW: 14.7 % (ref 11.5–15.5)
WBC: 5.2 10*3/uL (ref 4.0–10.5)
nRBC: 0 % (ref 0.0–0.2)

## 2021-02-08 LAB — COMPREHENSIVE METABOLIC PANEL
ALT: 50 U/L — ABNORMAL HIGH (ref 0–44)
AST: 59 U/L — ABNORMAL HIGH (ref 15–41)
Albumin: 3.4 g/dL — ABNORMAL LOW (ref 3.5–5.0)
Alkaline Phosphatase: 357 U/L — ABNORMAL HIGH (ref 38–126)
Anion gap: 8 (ref 5–15)
BUN: 15 mg/dL (ref 8–23)
CO2: 25 mmol/L (ref 22–32)
Calcium: 8.8 mg/dL — ABNORMAL LOW (ref 8.9–10.3)
Chloride: 99 mmol/L (ref 98–111)
Creatinine, Ser: 0.82 mg/dL (ref 0.61–1.24)
GFR, Estimated: >60 mL/min (ref >60)
Glucose, Bld: 171 mg/dL — ABNORMAL HIGH (ref 70–99)
Potassium: 4.1 mmol/L (ref 3.5–5.1)
Sodium: 132 mmol/L — ABNORMAL LOW (ref 135–145)
Total Bilirubin: 0.5 mg/dL (ref 0.3–1.2)
Total Protein: 7.8 g/dL (ref 6.5–8.1)

## 2021-02-08 LAB — PROTEIN, URINE, RANDOM: Total Protein, Urine: 50 mg/dL

## 2021-02-08 LAB — FERRITIN: Ferritin: 313 ng/mL (ref 24–336)

## 2021-02-08 LAB — VITAMIN B12: Vitamin B-12: 268 pg/mL (ref 180–914)

## 2021-02-08 MED ORDER — SODIUM CHLORIDE 0.9 % IV SOLN
1200.0000 mg | Freq: Once | INTRAVENOUS | Status: AC
  Administered 2021-02-08: 1200 mg via INTRAVENOUS
  Filled 2021-02-08: qty 20

## 2021-02-08 MED ORDER — SODIUM CHLORIDE 0.9 % IV SOLN
Freq: Once | INTRAVENOUS | Status: AC
  Filled 2021-02-08: qty 250

## 2021-02-08 MED ORDER — SODIUM CHLORIDE 0.9 % IV SOLN
15.0000 mg/kg | Freq: Once | INTRAVENOUS | Status: AC
  Administered 2021-02-08: 1000 mg via INTRAVENOUS
  Filled 2021-02-08: qty 32

## 2021-02-08 MED ORDER — SODIUM CHLORIDE 0.9 % IV SOLN
Freq: Once | INTRAVENOUS | Status: AC
Start: 2021-02-08 — End: 2021-02-08
  Filled 2021-02-08: qty 250

## 2021-02-08 MED ORDER — HEPARIN SOD (PORK) LOCK FLUSH 100 UNIT/ML IV SOLN
500.0000 [IU] | Freq: Once | INTRAVENOUS | Status: AC | PRN
  Administered 2021-02-08: 500 [IU]
  Filled 2021-02-08: qty 5

## 2021-02-08 NOTE — Progress Notes (Signed)
Per MD ok to proceed with Avastin today.  - Use previous  urine protein. Dr. Janese Banks gave order to proceed with phlebotomy today (500 ml)

## 2021-02-08 NOTE — Patient Instructions (Signed)
[facility]  Discharge Instructions: °Thank you for choosing [facility] to provide your oncology and hematology care.  °If you have a lab appointment with the Cancer Center, please go directly to the Cancer Center and check in at the registration area. ° °Wear comfortable clothing and clothing appropriate for easy access to any Portacath or PICC line.  ° °We strive to give you quality time with your provider. You may need to reschedule your appointment if you arrive late (15 or more minutes).  Arriving late affects you and other patients whose appointments are after yours.  Also, if you miss three or more appointments without notifying the office, you may be dismissed from the clinic at the provider’s discretion.    °  °For prescription refill requests, have your pharmacy contact our office and allow 72 hours for refills to be completed.   ° °Today you received the following chemotherapy and/or immunotherapy agents     °  °To help prevent nausea and vomiting after your treatment, we encourage you to take your nausea medication as directed. ° °BELOW ARE SYMPTOMS THAT SHOULD BE REPORTED IMMEDIATELY: °*FEVER GREATER THAN 100.4 F (38 °C) OR HIGHER °*CHILLS OR SWEATING °*NAUSEA AND VOMITING THAT IS NOT CONTROLLED WITH YOUR NAUSEA MEDICATION °*UNUSUAL SHORTNESS OF BREATH °*UNUSUAL BRUISING OR BLEEDING °*URINARY PROBLEMS (pain or burning when urinating, or frequent urination) °*BOWEL PROBLEMS (unusual diarrhea, constipation, pain near the anus) °TENDERNESS IN MOUTH AND THROAT WITH OR WITHOUT PRESENCE OF ULCERS (sore throat, sores in mouth, or a toothache) °UNUSUAL RASH, SWELLING OR PAIN  °UNUSUAL VAGINAL DISCHARGE OR ITCHING  ° °Items with * indicate a potential emergency and should be followed up as soon as possible or go to the Emergency Department if any problems should occur. ° °Please show the CHEMOTHERAPY ALERT CARD or IMMUNOTHERAPY ALERT CARD at check-in to the  Emergency Department and triage nurse. ° °Should you have questions after your visit or need to cancel or reschedule your appointment, please contact [facility]  [phone] and follow the prompts.  Office hours are 8:00 a.m. to 4:30 p.m. Monday - Friday. Please note that voicemails left after 4:00 p.m. may not be returned until the following business day.  We are closed weekends and major holidays. You have access to a nurse at all times for urgent questions. Please call the main number to the clinic [phone] and follow the prompts. ° °For any non-urgent questions, you may also contact your provider using MyChart. We now offer e-Visits for anyone 18 and older to request care online for non-urgent symptoms. For details visit [email or web address]. °  °Also download the MyChart app! Go to the app store, search "MyChart", open the app, select [facility], and log in with your MyChart username and password. ° °Due to Covid, a mask is required upon entering the hospital/clinic. If you do not have a mask, one will be given to you upon arrival. For doctor visits, patients may have 1 support person aged 18 or older with them. For treatment visits, patients cannot have anyone with them due to current Covid guidelines and our immunocompromised population.  °

## 2021-02-08 NOTE — Progress Notes (Signed)
[facility] [address] [place], [place] [address] [phone]   Interval Evaluation  Date of Service: 02/08/21 Patient Name: Maxwell Edwards Patient MRN: [id number] Patient DOB: [birth year] Provider: Ventura Sellers, MD  Identifying Statement:  Maxwell Edwards is a 65 y.o. male with Parkinsonism, unspecified Parkinsonism type ([place])   Primary Cancer:  Oncologic History: Oncology History  Cancer, hepatocellular ([place])  04/20/2020 Cancer Staging   Staging form: Liver, AJCC 8th Edition - Clinical stage from 04/20/2020: Stage IIIB (cT4, cN0, cM0) - Signed by Sindy Guadeloupe, MD on 04/21/2020    04/21/2020 Initial Diagnosis   Cancer, hepatocellular ([place])   05/10/2020 -  Chemotherapy   Patient is on Treatment Plan : Liver Atezolizumab + Bevacizumab q21d       Interval History: Maxwell Edwards presents today for follow up.  He was late to get started on his ropinorole, now on just day 3 of therapy.  He and his family have not yet noticed any improvement in his tremors, slow movements, imbalance.  He has not noted any side effects to this point.  Scheduled for avastin infusion today with Dr. Janese Banks.  H+P (01/18/21) Patient presents today with complaint of right hand  tremor.  He describes new onset shaking tremor of right hand, always while at rest, never with activity.  Onset is unclear, sometime in the past year or so; not before cancer diagnosis.  He denies any issues with cognition, gait, muscle tone, balance, but he does acknowledge that he's "a little slower" and lags behind people when walking in group compared to before.  No issues with sleep.  Continues on atezolizumab and avastin with Dr. Janese Banks for [facility].  Medications: Current Outpatient Medications on File Prior to Visit  Medication Sig Dispense Refill   fluticasone (FLONASE) 50 MCG/ACT nasal spray Place into both nostrils. (Patient not taking: No sig reported)     folic acid (FOLVITE) 1 MG  tablet Take 2 tablets (2 mg total) by mouth daily. 60 tablet 4   gabapentin (NEURONTIN) 300 MG capsule Take 1 capsule (300 mg total) by mouth at bedtime for 10 days, THEN 1 capsule (300 mg total) 2 (two) times daily for 10 days, THEN 1 capsule (300 mg total) 3 (three) times daily. (Patient not taking: Reported on 01/18/2021) 150 capsule 0   HYDROcodone-acetaminophen (NORCO/VICODIN) 5-325 MG tablet Take 1 tablet by mouth every 6 (six) hours as needed for moderate pain or severe pain. (Patient not taking: No sig reported) 60 tablet 0   lactulose (CHRONULAC) 10 GM/15ML solution Take 15 mLs (10 g total) by mouth daily. (Patient not taking: No sig reported) 236 mL 1   lidocaine-prilocaine (EMLA) cream Apply to affected area once (Patient not taking: Reported on 01/18/2021) 30 g 3   mirtazapine (REMERON) 15 MG tablet Take 1 tablet (15 mg total) by mouth at bedtime. (Patient not taking: No sig reported) 30 tablet 2   omeprazole (PRILOSEC) 40 MG capsule Take 1 capsule by mouth every morning. (Patient not taking: No sig reported)     ondansetron (ZOFRAN) 8 MG tablet Take 1 tablet (8 mg total) by mouth 2 (two) times daily as needed (Nausea or vomiting). (Patient not taking: No sig reported) 45 tablet 2   prochlorperazine (COMPAZINE) 10 MG tablet Take 1 tablet (10 mg total) by mouth every 6 (six) hours as needed (Nausea or vomiting). (Patient not taking: No sig reported) 30 tablet 1   rOPINIRole (REQUIP) 0.25 MG tablet  Take 0.25mg  3x per day for 1 day, then 0.5mg  3x per day for 1 day, then 0.75mg  3x per day for 1 day 18 tablet 0   rOPINIRole (REQUIP) 1 MG tablet Continuing titration, take 1mg  3x per day x7 days. Then increase to 2mg  3x per day x7 days 60 tablet 1   No current facility-administered medications on file prior to visit.    Allergies: No Known Allergies Past Medical History:  Past Medical History:  Diagnosis Date   Barrett esophagus    CAD (coronary artery disease)    Cancer ([place])    liver  cancer   Cirrhosis ([place])    Heart disease    Hereditary hemochromatosis ([place])    Past Surgical History:  Past Surgical History:  Procedure Laterality Date   CORONARY ARTERY BYPASS GRAFT     at 65 years old   ESOPHAGOGASTRODUODENOSCOPY (EGD) WITH PROPOFOL N/A 05/21/2020   Procedure: ESOPHAGOGASTRODUODENOSCOPY (EGD) WITH PROPOFOL;  Surgeon: Lin Landsman, MD;  Location: [facility];  Service: Gastroenterology;  Laterality: N/A;   LAPAROSCOPIC LYSIS OF ADHESIONS  06/12/2020   Procedure: LAPAROSCOPIC LYSIS OF ADHESIONS;  Surgeon: Jules Husbands, MD;  Location: [facility];  Service: General;;   PORTA CATH INSERTION N/A 04/30/2020   Procedure: PORTA CATH INSERTION;  Surgeon: Algernon Huxley, MD;  Location: [facility];  Service: Cardiovascular;  Laterality: N/A;   VEIN BYPASS SURGERY     Social History:  Social History   Socioeconomic History   Marital status: Divorced    Spouse name: Not on file   Number of children: 2   Years of education: Not on file   Highest education level: Not on file  Occupational History   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[employer] Use   Smoking status: Some Days    Packs/day: 0.25    Types: Cigarettes   Smokeless tobacco: Never  Vaping Use   Vaping Use: Never used  Substance and Sexual Activity   Alcohol use: Not Currently   Drug use: Not Currently   Sexual activity: Not Currently  Other Topics Concern   Not on file  Social History Narrative   Lives by himself    Social Determinants of Health   Financial Resource Strain: Not on file  Food Insecurity: Not on file  Transportation Needs: Not on file  Physical Activity: Not on file  Stress: Not on file  Social Connections: Not on file  Intimate Partner Violence: Not on file   Family History:  Family History  Problem Relation Age of Onset   Dementia Mother    Ulcers Father    Heart attack Brother    Throat cancer Brother    Heart attack Daughter    Diabetes  Daughter    Depression Daughter     Review of Systems: Constitutional: Doesn't report fevers, chills or abnormal weight loss Eyes: Doesn't report blurriness of vision Ears, nose, mouth, throat, and face: Doesn't report sore throat Respiratory: Doesn't report cough, dyspnea or wheezes Cardiovascular: Doesn't report palpitation, chest discomfort  Gastrointestinal:  Doesn't report nausea, constipation, diarrhea GU: Doesn't report incontinence Skin: Doesn't report skin rashes Neurological: Per HPI Musculoskeletal: Doesn't report joint pain Behavioral/Psych: Doesn't report anxiety  Physical Exam: Wt Readings from Last 3 Encounters:  01/18/21 147 lb 4.8 oz (66.8 kg)  12/28/20 144 lb 8 oz (65.5 kg)  12/07/20 146 lb 9.6 oz (66.5 kg)   Temp Readings from Last 3 Encounters:  01/30/21 97.9  F (36.6 C) (Tympanic)  01/25/21 98 F (36.7 C) (Tympanic)  01/18/21 98.7 F (37.1 C)   BP Readings from Last 3 Encounters:  01/30/21 111/71  01/25/21 125/68  01/18/21 115/72   Pulse Readings from Last 3 Encounters:  01/30/21 80  01/25/21 77  01/18/21 76   KPS: 80. General: Alert, cooperative, pleasant, in no acute distress Head: Normal EENT: No conjunctival injection or scleral icterus.  Lungs: Resp effort normal Cardiac: Regular rate Abdomen: Non-distended abdomen Skin: No rashes cyanosis or petechiae. Extremities: No clubbing or edema  Neurologic Exam: Mental Status: Awake, alert, attentive to examiner. Oriented to self and environment. Language is fluent with intact comprehension.  Cranial Nerves: Visual acuity is grossly normal. Visual fields are full. Extra-ocular movements intact. No ptosis. Face is symmetric Motor: Tone and bulk are normal. Pill rolling rest tremor in right hand, improves with activity or posturing. Power is full in both arms and legs. Reflexes are symmetric, no pathologic reflexes present.  Sensory: Intact to light touch Gait: Decreased right arm swing, turns  en bloc   Labs: I have reviewed the data as listed    Component Value Date/Time   NA 135 01/18/2021 0907   NA 135 04/06/2020 1152   K 4.1 01/18/2021 0907   CL 101 01/18/2021 0907   CO2 27 01/18/2021 0907   GLUCOSE 115 (H) 01/18/2021 0907   BUN 15 01/18/2021 0907   BUN 15 04/06/2020 1152   CREATININE 0.77 01/18/2021 0907   CALCIUM 8.8 (L) 01/18/2021 0907   PROT 7.8 01/18/2021 0907   PROT 8.2 04/06/2020 1152   ALBUMIN 3.3 (L) 01/18/2021 0907   ALBUMIN 4.3 04/06/2020 1152   AST 79 (H) 01/18/2021 0907   ALT 66 (H) 01/18/2021 0907   ALKPHOS 388 (H) 01/18/2021 0907   BILITOT 0.4 01/18/2021 0907   BILITOT 0.6 04/06/2020 1152   GFRNONAA >60 01/18/2021 0907   GFRAA 104 04/06/2020 1152   Lab Results  Component Value Date   WBC 5.6 01/18/2021   NEUTROABS 3.6 01/18/2021   HGB 11.8 (L) 01/30/2021   HCT 36.9 (L) 01/30/2021   MCV 97.4 01/18/2021   PLT 122 (L) 01/18/2021     Assessment/Plan Parkinsonism, unspecified Parkinsonism type ([place])  Maxwell Edwards is clinically stable today, no improvement yet from dopamine agonist therapy.  We again reviewed the titration schedule in detail.  Today we also reviewed neurocognitive deficits commonly associated with neurodegenerative disease, stroke and cancer.  ------------------------------------------------------ Patient presents with clinical syndrome consistent with parkinsonian tremor and gait.  He does not exhibit significant bradykinesia.    Etiology is either primary or secondary parkinsonism.  His medical history includes hemochromatosis and hepatocellular carcinoma, both of which could theoretically lead to structural brain disease.    We first recommended a brain MRI to rule out this involvement.    We will follow up with him following the imaging, either in person or virtually, to review further workup, or dopamine agonist therapy if indicated.  -------------------------------------------------------  We appreciate the  opportunity to participate in the care of Maxwell Edwards.  He should return to clinic in ~4 weeks for further med titration.  All questions were answered. The patient knows to call the clinic with any problems, questions or concerns. No barriers to learning were detected.  The total time spent in the encounter was 30 minutes and more than 50% was on counseling and review of test results   Ventura Sellers, MD Medical Director of Neuro-Oncology [facility]  at [facility] 02/08/21 10:10 AM

## 2021-02-09 ENCOUNTER — Encounter: Payer: Self-pay | Admitting: Oncology

## 2021-02-09 LAB — AFP TUMOR MARKER: AFP, Serum, Tumor Marker: 7.7 ng/mL (ref 0.0–8.4)

## 2021-02-09 NOTE — Progress Notes (Signed)
Hematology/Oncology Consult note [facility]  Telephone:([phone] [phone]  Patient Care Team: Venita Lick, NP as PCP - General (Nurse Practitioner) Clent Jacks, RN as Oncology Nurse Navigator Sindy Guadeloupe, MD as Consulting Physician (Hematology and Oncology)   Name of the patient: Maxwell Edwards  [id number]  1955-04-25   Date of visit: 02/09/21  Diagnosis- locally advanced unresectable hepatocellular carcinoma  Chief complaint/ Reason for visit-on treatment assessment prior to cycle 14 of a Avastin and Tecentriq  Heme/Onc history:  patient is a 65 year old male with a past medical history significant for hypertension hyperlipidemia, 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  Reports that he has lost about 20 pounds over the last 2 to 3 months. He is a chronic smoker which prompted a CT chest.  CT chest without contrast on 04/06/2020 showed no evidence of mediastinal or hilar adenopathy.  He was found to have 4 subcentimeter lung nodules 3 in the right lobe and one in the left upper lobe which were borderline for metastatic disease.  Cirrhotic liver morphology with 8.3 x 7 cm ill-defined hypoattenuating lesion in the left liver with a 3.3 cm lesion in the dome of the liver near the junction of the right and left hepatic lobes.  Labs showed elevated AST and ALT of 62 and 47 respectively with a normal albumin and normal total bilirubin.  CMP showed elevated H&H of 18.3/53.8.  Hepatitis C and HIV testing was negative. 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  Another mass in the segment 4A measuring 3.4 x 2.9 cm.  No evidence of local regional adenopathy.   Alpha-fetoprotein elevated  at 38,000.  CA 19-9 elevated at 147.Patient's case discussed at tumor board and consensus was that this would be consistent with [facility] especially given the AFP value.  Initially MRI was not characteristic for [facility] but a biopsy was not deemed necessary after AFP results were back   Avastin Tecentriq started in March 2022.  He is also getting weekly phlebotomy for iron overload    Interval history-patient reports doing well presently.  He has started smoking again.  Appetite and weight have remained stable.  He remains independent of his ADLs and IADLs.  ECOG PS- 1 Pain scale- 0   Review of systems- Review of Systems  Constitutional:  Positive for malaise/fatigue. Negative for chills, fever and weight loss.  HENT:  Negative for congestion, ear discharge and nosebleeds.   Eyes:  Negative for blurred vision.  Respiratory:  Negative for cough, hemoptysis, sputum production, shortness of breath and wheezing.   Cardiovascular:  Negative for chest pain, palpitations, orthopnea and claudication.  Gastrointestinal:  Negative for abdominal pain, blood in stool, constipation, diarrhea, heartburn, melena, nausea and vomiting.  Genitourinary:  Negative for dysuria, flank pain, frequency, hematuria and urgency.  Musculoskeletal:  Negative for back pain, joint pain and myalgias.  Skin:  Negative for rash.  Neurological:  Positive for tremors. Negative for dizziness, tingling, focal weakness, seizures, weakness and headaches.  Endo/Heme/Allergies:  Does not bruise/bleed easily.  Psychiatric/Behavioral:  Negative for depression and suicidal ideas. The patient does not have insomnia.      No Known Allergies   Past Medical History:  Diagnosis Date   Barrett esophagus    CAD (coronary artery disease)    Cancer (HCC)    liver cancer   Cirrhosis ([place])    Heart disease    Hereditary hemochromatosis ([place])      Past Surgical History:  Procedure Laterality Date   CORONARY ARTERY BYPASS GRAFT     at 65  years old   ESOPHAGOGASTRODUODENOSCOPY (EGD) WITH PROPOFOL N/A 05/21/2020   Procedure: ESOPHAGOGASTRODUODENOSCOPY (EGD) WITH PROPOFOL;  Surgeon: Lin Landsman, MD;  Location: [place];  Service: Gastroenterology;  Laterality: N/A;   LAPAROSCOPIC LYSIS OF ADHESIONS  06/12/2020   Procedure: LAPAROSCOPIC LYSIS OF ADHESIONS;  Surgeon: Jules Husbands, MD;  Location: [facility];  Service: General;;   PORTA CATH INSERTION N/A 04/30/2020   Procedure: PORTA CATH INSERTION;  Surgeon: Algernon Huxley, MD;  Location: [place] [facility];  Service: Cardiovascular;  Laterality: N/A;   VEIN BYPASS SURGERY      Social History   Socioeconomic History   Marital status: Divorced    Spouse name: Not on file   Number of children: 2   Years of education: Not on file   Highest education level: Not on file  Occupational History   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[employer] Use   Smoking status: Some Days    Packs/day: 0.25    Types: Cigarettes   Smokeless tobacco: Never  Vaping Use   Vaping Use: Never used  Substance and Sexual Activity   Alcohol use: Not Currently   Drug use: Not Currently   Sexual activity: Not Currently  Other Topics Concern   Not on file  Social History Narrative   Lives by himself    Social Determinants of Health   Financial Resource Strain: Not on file  Food Insecurity: Not on file  Transportation Needs: Not on file  Physical Activity: Not on file  Stress: Not on file  Social Connections: Not on file  Intimate Partner Violence: Not on file    Family History  Problem Relation Age of Onset   Dementia Mother    Ulcers Father    Heart attack Brother    Throat cancer Brother    Heart attack Daughter    Diabetes Daughter    Depression Daughter      Current Outpatient Medications:    folic acid (FOLVITE) 1 MG tablet, Take 2 tablets (2 mg total) by mouth daily., Disp: 60 tablet, Rfl: 4   rOPINIRole (REQUIP) 0.25 MG tablet, Take 0.25mg  3x per  day for 1 day, then 0.5mg  3x per day for 1 day, then 0.75mg  3x per day for 1 day, Disp: 18 tablet, Rfl: 0   rOPINIRole (REQUIP) 1 MG tablet, Continuing titration, take 1mg  3x per day x7 days. Then increase to 2mg  3x per day x7 days, Disp: 60 tablet, Rfl: 1   fluticasone (FLONASE) 50 MCG/ACT nasal spray, Place into both nostrils. (Patient not taking: Reported on 11/16/2020), Disp: , Rfl:    lactulose (CHRONULAC) 10 GM/15ML solution, Take 15 mLs (10 g total) by mouth daily. (Patient not taking: Reported on 11/16/2020), Disp: 236 mL, Rfl: 1   lidocaine-prilocaine (EMLA) cream, Apply to affected area once (Patient not taking: Reported on 01/18/2021), Disp: 30 g, Rfl: 3   omeprazole (PRILOSEC) 40 MG capsule, Take 1 capsule by mouth every morning. (Patient not taking: Reported on 09/14/2020), Disp: , Rfl:   Physical exam:  Vitals:   02/08/21 1140  BP: 131/82  Pulse:  81  Resp: 18  Temp: (!) 97.2 F (36.2 C)  SpO2: 100%  Weight: 146 lb (66.2 kg)   Physical Exam Constitutional:      General: He is not in acute distress. Cardiovascular:     Rate and Rhythm: Normal rate and regular rhythm.     Heart sounds: Normal heart sounds.  Pulmonary:     Effort: Pulmonary effort is normal.     Breath sounds: Normal breath sounds.  Abdominal:     General: Bowel sounds are normal.     Palpations: Abdomen is soft.  Skin:    General: Skin is warm and dry.  Neurological:     Mental Status: He is alert and oriented to person, place, and time.     CMP Latest Ref Rng & Units 02/08/2021  Glucose 70 - 99 mg/dL 171(H)  BUN 8 - 23 mg/dL 15  Creatinine 0.61 - 1.24 mg/dL 0.82  Sodium 135 - 145 mmol/L 132(L)  Potassium 3.5 - 5.1 mmol/L 4.1  Chloride 98 - 111 mmol/L 99  CO2 22 - 32 mmol/L 25  Calcium 8.9 - 10.3 mg/dL 8.8(L)  Total Protein 6.5 - 8.1 g/dL 7.8  Total Bilirubin 0.3 - 1.2 mg/dL 0.5  Alkaline Phos 38 - 126 U/L 357(H)  AST 15 - 41 U/L 59(H)  ALT 0 - 44 U/L 50(H)   CBC Latest Ref Rng & Units 02/08/2021   WBC 4.0 - 10.5 K/uL 5.2  Hemoglobin 13.0 - 17.0 g/dL 11.8(L)  Hematocrit 39.0 - 52.0 % 36.7(L)  Platelets 150 - 400 K/uL 109(L)    No images are attached to the encounter.  MR BRAIN W WO CONTRAST  Result Date: 01/28/2021 CLINICAL DATA:  Brain/CNS neoplasm, assess treatment response; hepatocellular carcinoma with Parkinson's EXAM: MRI HEAD WITHOUT AND WITH CONTRAST TECHNIQUE: Multiplanar, multiecho pulse sequences of the brain and surrounding structures were obtained without and with intravenous contrast. CONTRAST:  7.92mL GADAVIST GADOBUTROL 1 MMOL/ML IV SOLN COMPARISON:  None. FINDINGS: Brain: There is no acute infarction or intracranial hemorrhage. There is no intracranial mass, mass effect, or edema. There is no hydrocephalus or extra-axial fluid collection. Ventricles and sulci are within normal limits in size and configuration. Patchy T2 hyperintensity in the supratentorial white matter is nonspecific but may reflect mild chronic microvascular ischemic changes. There are chronic small vessel infarcts of the right thalamus, bilateral basal ganglia, and left internal capsule. No abnormal enhancement. Vascular: Major vessel flow voids at the skull base are preserved. Skull and upper cervical spine: Normal marrow signal is preserved. Sinuses/Orbits: Mild mucosal thickening.  Orbits are unremarkable. Other: Sella is unremarkable.  Mastoid air cells are clear. IMPRESSION: No evidence of intracranial metastatic disease. Chronic small vessel infarcts and chronic microvascular ischemic changes. Electronically Signed   By: Macy Mis M.D.   On: 01/28/2021 08:56   CT CHEST ABDOMEN PELVIS W CONTRAST  Result Date: 01/29/2021 CLINICAL DATA:  Hepatocellular carcinoma, assess treatment response EXAM: CT CHEST, ABDOMEN, AND PELVIS WITH CONTRAST TECHNIQUE: Multidetector CT imaging of the chest, abdomen and pelvis was performed following the standard protocol during bolus administration of intravenous  contrast. CONTRAST:  155mL OMNIPAQUE IOHEXOL 300 MG/ML SOLN, additional oral enteric contrast COMPARISON:  10/09/2020 FINDINGS: CT CHEST FINDINGS Cardiovascular: Right chest port catheter. Aortic atherosclerosis. Normal heart size. Three-vessel coronary artery calcifications. No pericardial effusion. Mediastinum/Nodes: Prominent right hilar lymph node measuring 1.9 x 1.3 cm (series 4, image 87). Thyroid gland, trachea, and esophagus demonstrate no significant findings. Lungs/Pleura: Mild centrilobular and paraseptal  emphysema. There are numerous, faint ground-glass opacities scattered throughout the lungs, new compared to prior examination, for example in the posterior right upper lobe (series 4, image 51) and in the medial right lower lobe (series 4, image 91). Scattered centrilobular and tree-in-bud nodules in the lateral segment right middle lobe seen on prior examination are diminished, consistent with resolving infection or inflammation (series 4, image 101). No pleural effusion or pneumothorax. Musculoskeletal: No chest wall mass or suspicious bone lesions identified. CT ABDOMEN PELVIS FINDINGS Hepatobiliary: Coarse, nodular, cirrhotic morphology of the liver. Hypodense mass of the left lobe of the liver is minimally diminished in size, measuring 7.5 x 5.3 cm, previously 8.0 x 5.8 cm when measured similarly (series 2, image 54). 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 Hypodense satellite lesion of the anterior liver dome is diminished in size measuring 2.1 x 1.7 cm, previously 2.9 x 2.7 cm (series 2, image 47). No gallstones, gallbladder wall thickening, or biliary dilatation. Pancreas: Unremarkable. No pancreatic ductal dilatation or surrounding inflammatory changes. Spleen: Mild splenomegaly, maximum coronal span 14.3 cm. Adrenals/Urinary Tract: Adrenal glands are unremarkable. Kidneys are normal, without renal calculi, solid lesion, or hydronephrosis. Bladder is unremarkable.  Stomach/Bowel: Stomach is within normal limits. Appendix is not clearly visualized. No evidence of bowel wall thickening, distention, or inflammatory changes. Vascular/Lymphatic: Severe, mixed aortic atherosclerosis. Unchanged prominent portacaval and celiac axis lymph nodes measuring up to 1.7 x 1.1 cm (series 2, image 62). Reproductive: Mild prostatomegaly. Other: No abdominal wall hernia or abnormality. No abdominopelvic ascites. Musculoskeletal: No acute or significant osseous findings. IMPRESSION: 1. Hypodense mass of the left lobe of the liver and satellite lesion of the liver dome are diminished in size, consistent with treatment response. 2. Unchanged prominent portacaval and celiac axis lymph nodes measuring up to 1.7 x 1.1 cm, possibly reactive to cirrhosis but modestly suspicious for nodal metastatic disease. Continued attention on follow-up. 3. 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 4. No evidence of metastatic disease in the chest. 5. There are numerous new, faint, scattered ground-glass opacities scattered throughout the lungs, nonspecific and infectious or inflammatory. Metastatic disease is strongly disfavored given this morphology. Attention on follow-up. 6. Prominent right hilar lymph node, unchanged compared to prior examination and possibly reactive to infection or inflammation. Attention on follow-up. 7. Stigmata of cirrhosis. 8. Coronary artery disease. Aortic Atherosclerosis (ICD10-I70.0) and Emphysema (ICD10-J43.9). Electronically Signed   By: Delanna Ahmadi M.D.   On: 01/29/2021 08:18     Assessment and plan- Patient is a 65 y.o. male  with locally advanced hepatocellular carcinoma.  He is here for on treatment assessment prior to cycle 14 of Avastin and Tecentriq  Counts okay to proceed with cycle 14 of a Avastin and Tecentriq today.  He is tolerating treatment well without any significant side effects.Blood pressure on Avastin is currently stable and urine  protein remains +1.  I have also reviewed CT chest abdomen pelvis images independently and discussed findings with the patient and his ex-wife Maxy.  There has been a continued shrinkage in the size of the dominant left lower lobe mass as well as satellite lesion but it remains enlarged at around 7.5 cm.  Celiac axis and portocaval lymph nodes overall stable.  No evidence of distant metastatic disease to the chest.  Plan is to continue a Avastin and Tecentriq until progression or toxicity.  aFP from today is pending but overall has trended down significantly and has normalized to 4.4  Patient will directly proceed with Avastin and Tecentriq in 3 weeks and I will see him back in 6 weeks for cycle 16.  His hemoglobin today is 11.8.  He has been receiving weekly phlebotomies due to iron overload from hemochromatosis.  Ferritin presentlyIs 313 and plan is to continue weekly phlebotomy until ferritin is less than 100.  Phlebotomy will be held if ferritin falls to less than 100 or hemoglobin is less than 11.  B12 levels were mildly low today at 268 and we will plan to start him on B12 injections every 3 weeks when he comes for his treatments.  Tremors: Currently on requip and being followed by neurology for possible parkinsonian etiology   Visit Diagnosis 1. Encounter for monoclonal antibody treatment for malignancy   2. Encounter for antineoplastic immunotherapy   3. Cancer, hepatocellular ([place])   4. Abnormal LFTs   5. Hereditary hemochromatosis ([place])      Dr. Randa Evens, MD, MPH [facility] at [facility] [phone] 02/09/2021 8:54 AM

## 2021-02-13 ENCOUNTER — Inpatient Hospital Stay: Payer: 59

## 2021-02-13 ENCOUNTER — Other Ambulatory Visit: Payer: Self-pay

## 2021-02-13 DIAGNOSIS — C220 Liver cell carcinoma: Secondary | ICD-10-CM

## 2021-02-13 DIAGNOSIS — Z5112 Encounter for antineoplastic immunotherapy: Secondary | ICD-10-CM | POA: Diagnosis not present

## 2021-02-13 LAB — HEMOGLOBIN AND HEMATOCRIT, BLOOD
HCT: 35.9 % — ABNORMAL LOW (ref 39.0–52.0)
Hemoglobin: 11.7 g/dL — ABNORMAL LOW (ref 13.0–17.0)

## 2021-02-13 MED ORDER — SODIUM CHLORIDE 0.9 % IV SOLN
Freq: Once | INTRAVENOUS | Status: AC
  Filled 2021-02-13: qty 250

## 2021-02-13 MED ORDER — HEPARIN SOD (PORK) LOCK FLUSH 100 UNIT/ML IV SOLN
500.0000 [IU] | Freq: Once | INTRAVENOUS | Status: AC
  Administered 2021-02-13: 500 [IU] via INTRAVENOUS
  Filled 2021-02-13: qty 5

## 2021-02-13 MED ORDER — SODIUM CHLORIDE 0.9% FLUSH
10.0000 mL | Freq: Once | INTRAVENOUS | Status: AC
  Administered 2021-02-13: 10 mL via INTRAVENOUS
  Filled 2021-02-13: qty 10

## 2021-02-13 NOTE — Progress Notes (Signed)
Will proceed with 500 ml phlebotomy today. Hgb is 11.7. Per Dr Janese Banks to hold off if hgb is less than 11 or ferritin less then 100. Pt denies feeling weak. VSS. Pt receiving IVF replacement via port. Phlebotomy is via R AC peripherally.

## 2021-02-13 NOTE — Patient Instructions (Signed)
Therapeutic Phlebotomy °Therapeutic phlebotomy is the planned removal of blood from a person's body for the purpose of treating a medical condition. The procedure is lot like donating blood. Usually, about a pint (470 mL, or 0.47 L) of blood is removed. The average adult has 9-12 pints (4.3-5.7 L) of blood in his or her body. °Therapeutic phlebotomy may be used to treat the following medical conditions: °Hemochromatosis. This is a condition in which the blood contains too much iron. °Polycythemia vera. This is a condition in which the blood contains too many red blood cells. °Porphyria cutanea tarda. This is a disease in which an important part of hemoglobin is not made properly. It results in the buildup of abnormal amounts of porphyrins in the body. °Sickle cell disease. This is a condition in which the red blood cells form an abnormal crescent shape rather than a round shape. °Tell a health care provider about: °Any allergies you have. °All medicines you are taking, including vitamins, herbs, eye drops, creams, and over-the-counter medicines. °Any bleeding problems you have. °Any surgeries you have had. °Any medical conditions you have. °Whether you are pregnant or may be pregnant. °What are the risks? °Generally, this is a safe procedure. However, problems may occur, including: °Nausea or light-headedness. °Low blood pressure (hypotension). °Soreness, bleeding, swelling, or bruising at the needle insertion site. °Infection. °What happens before the procedure? °Ask your health care provider about: °Changing or stopping your regular medicines. This is especially important if you are taking diabetes medicines or blood thinners. °Taking medicines such as aspirin and ibuprofen. These medicines can thin your blood. Do not take these medicines unless your health care provider tells you to take them. °Taking over-the-counter medicines, vitamins, herbs, and supplements. °Wear clothing with sleeves that can be raised  above the elbow. °You may have a blood sample taken. °Your blood pressure, pulse rate, and breathing rate will be measured. °What happens during the procedure? ° °You may be given a medicine to numb the area (local anesthetic). °A tourniquet will be placed on your arm. °A needle will be put into one of your veins. °Tubing and a collection bag will be attached to the needle. °Blood will flow through the needle and tubing into the collection bag. °The collection bag will be placed lower than your arm so gravity can help the blood flow into the bag. °You may be asked to open and close your hand slowly and continually during the entire collection. °After the specified amount of blood has been removed from your body, the collection bag and tubing will be clamped. °The needle will be removed from your vein. °Pressure will be held on the needle site to stop the bleeding. °A bandage (dressing) will be placed over the needle insertion site. °The procedure may vary among health care providers and hospitals. °What happens after the procedure? °Your blood pressure, pulse rate, and breathing rate will be measured after the procedure. °You will be encouraged to drink fluids. °You will be encouraged to eat a snack to prevent a low blood sugar level. °Your recovery will be assessed and monitored. °Return to your normal activities as told by your health care provider. °Summary °Therapeutic phlebotomy is the planned removal of blood from a person's body for the purpose of treating a medical condition. °Therapeutic phlebotomy may be used to treat hemochromatosis, polycythemia vera, porphyria cutanea tarda, or sickle cell disease. °In the procedure, a needle is inserted and about a pint (470 mL, or 0.47 L) of blood is   removed. The average adult has 9-12 pints (4.3-5.7 L) of blood in the body. °This is generally a safe procedure, but it can sometimes cause problems such as nausea, light-headedness, or low blood pressure  (hypotension). °This information is not intended to replace advice given to you by your health care provider. Make sure you discuss any questions you have with your health care provider. °Document Revised: 08/22/2020 Document Reviewed: 08/22/2020 °Elsevier Patient Education © 2022 [employer] Inc. ° °

## 2021-02-14 ENCOUNTER — Other Ambulatory Visit: Payer: Self-pay | Admitting: Oncology

## 2021-02-18 ENCOUNTER — Encounter: Payer: Self-pay | Admitting: Internal Medicine

## 2021-02-18 NOTE — Telephone Encounter (Signed)
Called and spoke to pt's ex-wife, Max, and given advice per Dr. Mickeal Skinner. Max concerned about depression, and she reports that pt has not been depressed the entire time he's been undergoing cancer treatment. She verbalized understanding and would relay the information to pt.

## 2021-02-20 ENCOUNTER — Inpatient Hospital Stay: Payer: 59

## 2021-02-20 ENCOUNTER — Other Ambulatory Visit: Payer: Self-pay

## 2021-02-20 ENCOUNTER — Encounter: Payer: Self-pay | Admitting: Oncology

## 2021-02-20 DIAGNOSIS — Z5112 Encounter for antineoplastic immunotherapy: Secondary | ICD-10-CM | POA: Diagnosis not present

## 2021-02-20 DIAGNOSIS — C220 Liver cell carcinoma: Secondary | ICD-10-CM

## 2021-02-20 LAB — HEMOGLOBIN AND HEMATOCRIT, BLOOD
HCT: 25.8 % — ABNORMAL LOW (ref 39.0–52.0)
Hemoglobin: 8.1 g/dL — ABNORMAL LOW (ref 13.0–17.0)

## 2021-02-20 MED ORDER — HEPARIN SOD (PORK) LOCK FLUSH 100 UNIT/ML IV SOLN
500.0000 [IU] | Freq: Once | INTRAVENOUS | Status: AC
  Administered 2021-02-20: 10:00:00 500 [IU] via INTRAVENOUS
  Filled 2021-02-20: qty 5

## 2021-02-20 MED ORDER — SODIUM CHLORIDE 0.9 % IV SOLN
INTRAVENOUS | Status: DC
  Filled 2021-02-20 (×2): qty 250

## 2021-02-20 MED ORDER — SODIUM CHLORIDE 0.9% FLUSH
10.0000 mL | INTRAVENOUS | Status: DC | PRN
  Administered 2021-02-20: 09:00:00 10 mL via INTRAVENOUS
  Filled 2021-02-20: qty 10

## 2021-02-20 NOTE — Progress Notes (Signed)
Pt has stopped taking requip which was prescribed for his tremors. He has lost his appetite, feels less energy and depressed. His gait has become "staggery". He would rather deal with the tremors. Here today for possible phlebotomy. Hgb is 8.1 today. Informed pts Doctors nurse. No phlebotomy today. Will give 1 L NS for hydration.

## 2021-02-23 ENCOUNTER — Inpatient Hospital Stay
Admission: EM | Admit: 2021-02-23 | Discharge: 2021-02-27 | DRG: 432 | Disposition: A | Discharge status: Home / Self Care | Payer: Medicare Other | Attending: Internal Medicine | Admitting: Internal Medicine

## 2021-02-23 ENCOUNTER — Encounter: Payer: Self-pay | Admitting: Oncology

## 2021-02-23 ENCOUNTER — Emergency Department: Payer: Medicare Other

## 2021-02-23 ENCOUNTER — Telehealth: Payer: Self-pay | Admitting: Oncology

## 2021-02-23 ENCOUNTER — Other Ambulatory Visit: Payer: Self-pay

## 2021-02-23 ENCOUNTER — Encounter: Payer: Self-pay | Admitting: Internal Medicine

## 2021-02-23 DIAGNOSIS — I2510 Atherosclerotic heart disease of native coronary artery without angina pectoris: Secondary | ICD-10-CM | POA: Diagnosis not present

## 2021-02-23 DIAGNOSIS — Z66 Do not resuscitate: Secondary | ICD-10-CM

## 2021-02-23 DIAGNOSIS — K7460 Unspecified cirrhosis of liver: Principal | ICD-10-CM | POA: Diagnosis present

## 2021-02-23 DIAGNOSIS — E872 Acidosis, unspecified: Secondary | ICD-10-CM | POA: Diagnosis not present

## 2021-02-23 DIAGNOSIS — C220 Liver cell carcinoma: Secondary | ICD-10-CM | POA: Diagnosis not present

## 2021-02-23 DIAGNOSIS — Z951 Presence of aortocoronary bypass graft: Secondary | ICD-10-CM | POA: Diagnosis not present

## 2021-02-23 DIAGNOSIS — Z79899 Other long term (current) drug therapy: Secondary | ICD-10-CM

## 2021-02-23 DIAGNOSIS — K766 Portal hypertension: Secondary | ICD-10-CM | POA: Diagnosis present

## 2021-02-23 DIAGNOSIS — Z20822 Contact with and (suspected) exposure to covid-19: Secondary | ICD-10-CM | POA: Diagnosis not present

## 2021-02-23 DIAGNOSIS — Z7982 Long term (current) use of aspirin: Secondary | ICD-10-CM | POA: Diagnosis not present

## 2021-02-23 DIAGNOSIS — D62 Acute posthemorrhagic anemia: Secondary | ICD-10-CM

## 2021-02-23 DIAGNOSIS — I8511 Secondary esophageal varices with bleeding: Secondary | ICD-10-CM | POA: Diagnosis present

## 2021-02-23 DIAGNOSIS — K2270 Barrett's esophagus without dysplasia: Secondary | ICD-10-CM | POA: Diagnosis not present

## 2021-02-23 DIAGNOSIS — F17210 Nicotine dependence, cigarettes, uncomplicated: Secondary | ICD-10-CM | POA: Diagnosis present

## 2021-02-23 DIAGNOSIS — Z8249 Family history of ischemic heart disease and other diseases of the circulatory system: Secondary | ICD-10-CM | POA: Diagnosis not present

## 2021-02-23 DIAGNOSIS — K209 Esophagitis, unspecified without bleeding: Secondary | ICD-10-CM | POA: Diagnosis not present

## 2021-02-23 DIAGNOSIS — K920 Hematemesis: Secondary | ICD-10-CM | POA: Diagnosis not present

## 2021-02-23 DIAGNOSIS — I8510 Secondary esophageal varices without bleeding: Secondary | ICD-10-CM | POA: Diagnosis not present

## 2021-02-23 DIAGNOSIS — D696 Thrombocytopenia, unspecified: Secondary | ICD-10-CM | POA: Diagnosis present

## 2021-02-23 DIAGNOSIS — J432 Centrilobular emphysema: Secondary | ICD-10-CM | POA: Diagnosis present

## 2021-02-23 DIAGNOSIS — Z86718 Personal history of other venous thrombosis and embolism: Secondary | ICD-10-CM | POA: Diagnosis not present

## 2021-02-23 DIAGNOSIS — I8500 Esophageal varices without bleeding: Secondary | ICD-10-CM | POA: Diagnosis not present

## 2021-02-23 DIAGNOSIS — N179 Acute kidney failure, unspecified: Secondary | ICD-10-CM | POA: Diagnosis not present

## 2021-02-23 DIAGNOSIS — I959 Hypotension, unspecified: Secondary | ICD-10-CM | POA: Diagnosis not present

## 2021-02-23 DIAGNOSIS — K439 Ventral hernia without obstruction or gangrene: Secondary | ICD-10-CM | POA: Diagnosis not present

## 2021-02-23 DIAGNOSIS — G20 Parkinson's disease: Secondary | ICD-10-CM | POA: Diagnosis not present

## 2021-02-23 DIAGNOSIS — K922 Gastrointestinal hemorrhage, unspecified: Secondary | ICD-10-CM | POA: Diagnosis not present

## 2021-02-23 DIAGNOSIS — D649 Anemia, unspecified: Secondary | ICD-10-CM | POA: Diagnosis not present

## 2021-02-23 DIAGNOSIS — D689 Coagulation defect, unspecified: Secondary | ICD-10-CM | POA: Diagnosis not present

## 2021-02-23 DIAGNOSIS — K449 Diaphragmatic hernia without obstruction or gangrene: Secondary | ICD-10-CM | POA: Diagnosis present

## 2021-02-23 DIAGNOSIS — R739 Hyperglycemia, unspecified: Secondary | ICD-10-CM | POA: Diagnosis not present

## 2021-02-23 DIAGNOSIS — R0689 Other abnormalities of breathing: Secondary | ICD-10-CM | POA: Diagnosis not present

## 2021-02-23 HISTORY — DX: Acute posthemorrhagic anemia: D62

## 2021-02-23 LAB — COMPREHENSIVE METABOLIC PANEL
ALT: 38 U/L (ref 0–44)
AST: 55 U/L — ABNORMAL HIGH (ref 15–41)
Albumin: 2.5 g/dL — ABNORMAL LOW (ref 3.5–5.0)
Alkaline Phosphatase: 189 U/L — ABNORMAL HIGH (ref 38–126)
Anion gap: 13 (ref 5–15)
BUN: 37 mg/dL — ABNORMAL HIGH (ref 8–23)
CO2: 18 mmol/L — ABNORMAL LOW (ref 22–32)
Calcium: 8 mg/dL — ABNORMAL LOW (ref 8.9–10.3)
Chloride: 104 mmol/L (ref 98–111)
Creatinine, Ser: 1.11 mg/dL (ref 0.61–1.24)
GFR, Estimated: >60 mL/min (ref >60)
Glucose, Bld: 133 mg/dL — ABNORMAL HIGH (ref 70–99)
Potassium: 4.3 mmol/L (ref 3.5–5.1)
Sodium: 135 mmol/L (ref 135–145)
Total Bilirubin: 0.7 mg/dL (ref 0.3–1.2)
Total Protein: 6 g/dL — ABNORMAL LOW (ref 6.5–8.1)

## 2021-02-23 LAB — CBC WITH DIFFERENTIAL/PLATELET
Abs Immature Granulocytes: 0.11 10*3/uL — ABNORMAL HIGH (ref 0.00–0.07)
Basophils Absolute: 0.1 10*3/uL (ref 0.0–0.1)
Basophils Relative: 1 %
Eosinophils Absolute: 0 10*3/uL (ref 0.0–0.5)
Eosinophils Relative: 0 %
HCT: 15.9 % — ABNORMAL LOW (ref 39.0–52.0)
Hemoglobin: 4.7 g/dL — CL (ref 13.0–17.0)
Immature Granulocytes: 1 %
Lymphocytes Relative: 13 %
Lymphs Abs: 2.1 10*3/uL (ref 0.7–4.0)
MCH: 28 pg (ref 26.0–34.0)
MCHC: 29.6 g/dL — ABNORMAL LOW (ref 30.0–36.0)
MCV: 94.6 fL (ref 80.0–100.0)
Monocytes Absolute: 1.5 10*3/uL — ABNORMAL HIGH (ref 0.1–1.0)
Monocytes Relative: 10 %
Neutro Abs: 11.9 10*3/uL — ABNORMAL HIGH (ref 1.7–7.7)
Neutrophils Relative %: 75 %
Platelets: 261 10*3/uL (ref 150–400)
RBC: 1.68 MIL/uL — ABNORMAL LOW (ref 4.22–5.81)
RDW: 18.1 % — ABNORMAL HIGH (ref 11.5–15.5)
WBC: 15.6 10*3/uL — ABNORMAL HIGH (ref 4.0–10.5)
nRBC: 0.3 % — ABNORMAL HIGH (ref 0.0–0.2)

## 2021-02-23 LAB — RESP PANEL BY RT-PCR (FLU A&B, COVID) ARPGX2
Influenza A by PCR: NEGATIVE
Influenza B by PCR: NEGATIVE
SARS Coronavirus 2 by RT PCR: NEGATIVE

## 2021-02-23 LAB — PROTIME-INR
INR: 1.4 — ABNORMAL HIGH (ref 0.8–1.2)
Prothrombin Time: 17 seconds — ABNORMAL HIGH (ref 11.4–15.2)

## 2021-02-23 LAB — LACTIC ACID, PLASMA
Lactic Acid, Venous: 6.9 mmol/L (ref 0.5–1.9)
Lactic Acid, Venous: 3.7 mmol/L (ref 0.5–1.9)

## 2021-02-23 LAB — APTT: aPTT: 29 seconds (ref 24–36)

## 2021-02-23 LAB — AMMONIA: Ammonia: 12 umol/L (ref 9–35)

## 2021-02-23 LAB — PREPARE RBC (CROSSMATCH)

## 2021-02-23 MED ORDER — PANTOPRAZOLE 80MG IVPB - SIMPLE MED
80.0000 mg | Freq: Once | INTRAVENOUS | Status: AC
  Administered 2021-02-23: 80 mg via INTRAVENOUS
  Filled 2021-02-23: qty 80

## 2021-02-23 MED ORDER — OCTREOTIDE LOAD VIA INFUSION
50.0000 ug | Freq: Once | INTRAVENOUS | Status: AC
  Administered 2021-02-23: 50 ug via INTRAVENOUS
  Filled 2021-02-23: qty 25

## 2021-02-23 MED ORDER — SODIUM CHLORIDE 0.9 % IV SOLN
50.0000 ug/h | INTRAVENOUS | Status: DC
  Administered 2021-02-23 – 2021-02-26 (×6): 50 ug/h via INTRAVENOUS
  Filled 2021-02-23 (×13): qty 1

## 2021-02-23 MED ORDER — PANTOPRAZOLE INFUSION (NEW) - SIMPLE MED
8.0000 mg/h | INTRAVENOUS | Status: AC
  Administered 2021-02-23 – 2021-02-26 (×5): 8 mg/h via INTRAVENOUS
  Filled 2021-02-23 (×4): qty 100
  Filled 2021-02-23: qty 80
  Filled 2021-02-23: qty 100

## 2021-02-23 MED ORDER — ONDANSETRON HCL 4 MG/2ML IJ SOLN
4.0000 mg | Freq: Once | INTRAMUSCULAR | Status: AC
  Administered 2021-02-23: 4 mg via INTRAVENOUS
  Filled 2021-02-23: qty 2

## 2021-02-23 MED ORDER — SODIUM CHLORIDE 0.9 % IV SOLN
1.0000 g | Freq: Once | INTRAVENOUS | Status: AC
  Administered 2021-02-23: 1 g via INTRAVENOUS
  Filled 2021-02-23: qty 10

## 2021-02-23 MED ORDER — METOCLOPRAMIDE HCL 5 MG/ML IJ SOLN
10.0000 mg | Freq: Four times a day (QID) | INTRAMUSCULAR | Status: AC
  Administered 2021-02-23 – 2021-02-24 (×3): 10 mg via INTRAVENOUS
  Filled 2021-02-23 (×3): qty 2

## 2021-02-23 MED ORDER — SODIUM CHLORIDE 0.9% IV SOLUTION: Freq: Once | INTRAVENOUS | Status: AC

## 2021-02-23 MED ORDER — IOHEXOL 350 MG/ML SOLN
100.0000 mL | Freq: Once | INTRAVENOUS | Status: AC | PRN
  Administered 2021-02-23: 100 mL via INTRAVENOUS

## 2021-02-23 MED ORDER — PANTOPRAZOLE SODIUM 40 MG IV SOLR: 40.0000 mg | Freq: Two times a day (BID) | INTRAVENOUS | Status: DC

## 2021-02-23 NOTE — ACP (Advance Care Planning) (Signed)
°  Advance Care Planning  Reason for Advance Care Planning Conversation: Acute hospitalization Principal Problem:   Acute GI bleeding Active Problems:   CAD (coronary artery disease)   Acute blood loss anemia   Nicotine dependence, cigarettes, uncomplicated   Centrilobular emphysema (HCC)   Cancer, hepatocellular (HCC)   Hereditary hemochromatosis ([place])   Iron overload   DNR (do not resuscitate)/DNI(Do Not Intubate)    I discussed with patient and ex-wife Maxwell Edwards  about advance care planning. Specifically, we discussed whether patient would desire cardiopulmonary resuscitation (CPR) in the event of acute cardiopulmonary arrest. We also discussed whether endotracheal intubation and temporary ventilator life support would be desired in the event of acute cardio- or pulmonary decompensation.   Code status order of DNR has been entered in accord with the patient's wishes. no intubation  Living will: no  Health Care Agent / Davonna Belling              Name: Maxwell Edwards: Relationship to Patient: ex-wife   Is agent appointed in legal document no  Time spent today in ACP discussion was  5 mins  Kristopher Oppenheim, DO [facility]

## 2021-02-23 NOTE — Assessment & Plan Note (Signed)
Patient was taking aspirin at home.  We will have to hold this for now due to his GI bleeding.

## 2021-02-23 NOTE — H&P (Signed)
History and Physical    Zyiere Rosemond PJK:[id number] DOB: 1955-11-19 DOA: 02/23/2021  PCP: Venita Lick, NP   Patient coming from: Home  I have personally briefly reviewed patient's old medical records in [place]  Chief complaint: Hematemesis History of present illness: 65 year old male with a history of hepatocellular carcinoma, coronary disease, hereditary hemochromatosis on weekly phlebotomy, history of esophageal varices without bleeding, COPD who presents to the ER today with a week history of black tarry stools and 2-day history of hematemesis.  Patient's ex-wife is is also his caretaker.  She states that patient told her about the bloody stools today.  She has not seen them in about 3 to 4 days.  Patient states he has been vomiting blood for the last 2 days.  Ex-wife states that he drank a little bit of water today and started vomiting black blood today.  Patient has felt tired.  No abdominal pain.  No chest pain.  On arrival to the ER, temperature 98.5 heart rate 96 blood pressure 101/61.  Repeat blood pressure 85/47 and then again 97/49.  Patient started on 1 of 2 unit packed red blood transfusions in the ER.  GI has been consulted by EDP  Due to the patient's acute blood loss anemia, GI bleed, [facility] contacted for admission.   ED Course:    He was noted to be 4.7.  Patient started on 2 units of packed red blood cells.  Protonix and octreotide given.  Review of Systems:  Review of Systems  Constitutional:  Positive for malaise/fatigue. Negative for chills, fever and weight loss.  HENT: Negative.    Eyes: Negative.   Respiratory: Negative.    Cardiovascular: Negative.   Gastrointestinal:  Positive for blood in stool and vomiting.       Positive for hematemesis.  Genitourinary: Negative.   Musculoskeletal: Negative.   Skin: Negative.   Neurological: Negative.   Endo/Heme/Allergies: Negative.   Psychiatric/Behavioral: Negative.    All other  systems reviewed and are negative.  Past Medical History:  Diagnosis Date   Barrett esophagus    CAD (coronary artery disease)    Cancer ([place])    liver cancer   Cirrhosis ([place])    Heart disease    Hereditary hemochromatosis ([place])     Past Surgical History:  Procedure Laterality Date   CORONARY ARTERY BYPASS GRAFT     at 65 years old   ESOPHAGOGASTRODUODENOSCOPY (EGD) WITH PROPOFOL N/A 05/21/2020   Procedure: ESOPHAGOGASTRODUODENOSCOPY (EGD) WITH PROPOFOL;  Surgeon: Lin Landsman, MD;  Location: [place] [facility];  Service: Gastroenterology;  Laterality: N/A;   LAPAROSCOPIC LYSIS OF ADHESIONS  06/12/2020   Procedure: LAPAROSCOPIC LYSIS OF ADHESIONS;  Surgeon: Jules Husbands, MD;  Location: [facility];  Service: General;;   PORTA CATH INSERTION N/A 04/30/2020   Procedure: PORTA CATH INSERTION;  Surgeon: Algernon Huxley, MD;  Location: [facility];  Service: Cardiovascular;  Laterality: N/A;   VEIN BYPASS SURGERY       reports that he has been smoking cigarettes. He has been smoking an average of .5 packs per day. He has never used smokeless tobacco. He reports that he does not currently use alcohol. He reports that he does not currently use drugs.  No Known Allergies  Family History  Problem Relation Age of Onset   Dementia Mother    Ulcers Father    Heart attack Brother    Throat cancer Brother    Heart attack Daughter  Diabetes Daughter    Depression Daughter     Prior to Admission medications   Medication Sig Start Date End Date Taking? Authorizing Provider  fluticasone (FLONASE) 50 MCG/ACT nasal spray Place into both nostrils. Patient not taking: Reported on 11/16/2020 06/04/20   [provider]  folic acid (FOLVITE) 1 MG tablet Take 2 tablets (2 mg total) by mouth daily. 11/15/20   Sindy Guadeloupe, MD  lactulose (CHRONULAC) 10 GM/15ML solution Take 15 mLs (10 g total) by mouth daily. Patient not taking: Reported on 11/16/2020 08/17/20   Borders, Kirt Boys, NP   lidocaine-prilocaine (EMLA) cream Apply to affected area once 04/21/20   Sindy Guadeloupe, MD  omeprazole (PRILOSEC) 40 MG capsule Take 1 capsule by mouth every morning. Patient not taking: Reported on 09/14/2020 06/30/20   [provider]  rOPINIRole (REQUIP) 0.25 MG tablet Take 0.25mg  3x per day for 1 day, then 0.5mg  3x per day for 1 day, then 0.75mg  3x per day for 1 day Patient not taking: Reported on 02/20/2021 02/04/21   Ventura Sellers, MD  rOPINIRole (REQUIP) 1 MG tablet Continuing titration, take 1mg  3x per day x7 days. Then increase to 2mg  3x per day x7 days Patient not taking: Reported on 02/20/2021 02/04/21   Ventura Sellers, MD    Physical Exam: Vitals:   02/23/21 1430 02/23/21 1454 02/23/21 1500 02/23/21 1512  BP: 101/88 (!) 85/47 (!) 97/49 (!) 107/49  Pulse: (!) 103 100 70 92  Resp: 19 18 (!) 21 19  Temp:  98.3 F (36.8 C)  (!) 97.5 F (36.4 C)  TempSrc:  Oral  Oral  SpO2: 100%  100%   Weight:      Height:        Physical Exam Vitals and nursing note reviewed.  Constitutional:      General: He is not in acute distress.    Appearance: He is ill-appearing. He is not toxic-appearing or diaphoretic.     Comments: Chronically ill appearing.  No distress.  Patient appears pale.  HENT:     Head: Normocephalic and atraumatic.     Nose: Nose normal. No rhinorrhea.  Eyes:     General:        Right eye: No discharge.        Left eye: No discharge.  Cardiovascular:     Rate and Rhythm: Normal rate and regular rhythm.     Heart sounds: Murmur heard.  Pulmonary:     Effort: Pulmonary effort is normal. No respiratory distress.     Breath sounds: Normal breath sounds. No wheezing or rales.  Abdominal:     General: Bowel sounds are normal. There is no distension.     Tenderness: There is no abdominal tenderness. There is no rebound.  Musculoskeletal:     Right lower leg: No edema.     Left lower leg: No edema.  Skin:    General: Skin is warm and dry.      Capillary Refill: Capillary refill takes less than 2 seconds.  Neurological:     General: No focal deficit present.     Mental Status: He is alert and oriented to person, place, and time.     Labs on Admission: I have personally reviewed following labs and imaging studies  CBC: Recent Labs  Lab 02/20/21 0842 02/23/21 1339  WBC  --  15.6*  NEUTROABS  --  11.9*  HGB 8.1* 4.7*  HCT 25.8* 15.9*  MCV  --  94.6  PLT  --  416   Basic Metabolic Panel: Recent Labs  Lab 02/23/21 1339  NA 135  K 4.3  CL 104  CO2 18*  GLUCOSE 133*  BUN 37*  CREATININE 1.11  CALCIUM 8.0*   GFR: Estimated Creatinine Clearance: 61.7 mL/min (by C-G formula based on SCr of 1.11 mg/dL). Liver Function Tests: Recent Labs  Lab 02/23/21 1339  AST 55*  ALT 38  ALKPHOS 189*  BILITOT 0.7  PROT 6.0*  ALBUMIN 2.5*   No results for input(s): LIPASE, AMYLASE in the last 168 hours. Recent Labs  Lab 02/23/21 1339  AMMONIA 12   Coagulation Profile: Recent Labs  Lab 02/23/21 1339  INR 1.4*   Cardiac Enzymes: No results for input(s): CKTOTAL, CKMB, CKMBINDEX, TROPONINI in the last 168 hours. BNP (last 3 results) No results for input(s): PROBNP in the last 8760 hours. HbA1C: No results for input(s): HGBA1C in the last 72 hours. CBG: No results for input(s): GLUCAP in the last 168 hours. Lipid Profile: No results for input(s): CHOL, HDL, LDLCALC, TRIG, CHOLHDL, LDLDIRECT in the last 72 hours. Thyroid Function Tests: No results for input(s): TSH, T4TOTAL, FREET4, T3FREE, THYROIDAB in the last 72 hours. Anemia Panel: No results for input(s): VITAMINB12, FOLATE, FERRITIN, TIBC, IRON, RETICCTPCT in the last 72 hours. Urine analysis:    Component Value Date/Time   COLORURINE AMBER (A) 07/13/2020 0826   APPEARANCEUR HAZY (A) 07/13/2020 0826   LABSPEC 1.019 07/13/2020 0826   PHURINE 5.0 07/13/2020 0826   GLUCOSEU NEGATIVE 07/13/2020 0826   HGBUR LARGE (A) 07/13/2020 0826   BILIRUBINUR NEGATIVE  07/13/2020 0826   KETONESUR NEGATIVE 07/13/2020 0826   PROTEINUR 100 (A) 07/13/2020 0826   NITRITE NEGATIVE 07/13/2020 0826   LEUKOCYTESUR NEGATIVE 07/13/2020 0826    Radiological Exams on Admission: I have personally reviewed images CT Angio Abd/Pel W and/or Wo Contrast  Result Date: 02/23/2021 CLINICAL DATA:  Lower GI bleeding. History of hepatocellular carcinoma. EXAM: CTA ABDOMEN AND PELVIS WITHOUT AND WITH CONTRAST TECHNIQUE: Multidetector CT imaging of the abdomen and pelvis was performed using the standard protocol during bolus administration of intravenous contrast. Multiplanar reconstructed images and MIPs were obtained and reviewed to evaluate the vascular anatomy. CONTRAST:  11mL OMNIPAQUE IOHEXOL 350 MG/ML SOLN COMPARISON:  CT the chest, abdomen pelvis-01/28/2021; 10/09/2020 FINDINGS: VASCULAR Aorta: There is a large amount of irregular calcified and noncalcified atherosclerotic plaque throughout the abdominal aorta approaching though not definitively resulting in a hemodynamically significant stenosis. No evidence of abdominal aortic dissection or perivascular stranding. Celiac: Mixed calcified and noncalcified atherosclerotic plaque involves the origin the celiac artery, not resulting in hemodynamically significant stenosis. Conventional branching pattern. SMA: Mixed calcified and noncalcified atherosclerotic plaque involves the origin, proximal aspect and main trunk of the SMA resulting in tandem areas of at least 50% luminal narrowing (representative axial images 74 and 78, series 5). Conventional branching pattern. The distal tributaries of the SMA appear widely patent without discrete lumen filling defect to suggest distal embolism. Renals: Solitary bilaterally; there is a moderate to large amount of eccentric irregular mixed calcified and noncalcified atherosclerotic plaque involving the origin of the right renal artery resulting in suspected severe narrowing (coronal image 72, series  9). There is a large amount of mixed calcified and noncalcified atherosclerotic plaque throughout the left renal artery resulting in tandem areas of at least 50% luminal narrowing (representative coronal image 78, series 9). These findings are without associated asymmetric renal atrophy or delayed renal enhancement. No vessel irregularity to suggest  FMD. IMA: Diseased at its origin though remains patent. Inflow: Post stenting of the left common iliac artery with suspected hemodynamically significant narrowing at its distal aspect (coronal image 79, series 9). Eccentric calcified and noncalcified atherosclerotic plaque results in tandem areas of approximately 50% luminal narrowing involving the left external iliac artery. Moderate amount of mixed calcified and noncalcified atherosclerotic plaque results in tandem areas of at least 50% luminal narrowing throughout the right common and external iliac arteries. Proximal Outflow: Suspected hemodynamically significant narrowings involving the bilateral common and imaged portions of the bilateral superficial femoral arteries. Veins: The IVC and pelvic venous systems appear widely patent. Review of the MIP images confirms the above findings. _________________________________________________________ NON-VASCULAR Lower chest: Limited visualization of the lower thorax demonstrates residual ill-defined ground-glass opacities within the bilateral lung bases, improved compared to the 01/28/2021 examination. Minimal nodular atelectasis within the left costophrenic angle. No discrete focal airspace opacities. No pleural effusion. Normal heart size. Diffuse decreased attenuation of the intra cardiac blood pool suggestive of anemia. Hepatobiliary: Nodularity of the hepatic contour compatible with known history of cirrhosis. Ill-defined hypoattenuating mass within the left lobe of the liver is grossly unchanged compared to recent examination performed 01/28/2021 measuring at least 7.8  x 5.3 by 6.0 cm (axial image 17, series 6; coronal image 36, series 9, as is a satellite lesion within the dome of the right lobe of the liver measuring 2.4 cm (image 10, series 6). No new discrete hepatic lesions. Normal appearance of the gallbladder given degree distention. No radiopaque gallstones. No intra or extrahepatic biliary duct dilatation. 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 No ascites. Pancreas: Normal caliber of the abdominal aorta. Spleen: Redemonstrated splenomegaly with the spleen measuring 15 cm in length (image 25, series 6). Adrenals/Urinary Tract: There is symmetric enhancement of the bilateral kidneys. No evidence of nephrolithiasis. No discrete renal lesions. No urine obstruction or perinephric stranding. Normal appearance of the bilateral adrenal glands. Normal appearance of the urinary bladder given degree of distention. Stomach/Bowel: Large colonic stool burden, particularly within the rectal vault. There is apparent ill-defined stranding seen adjacent to the hepatic flexure of the colon within the right upper abdominal quadrant (coronal image 55, series 14; axial image 43, series 6) otherwise, no discrete areas of bowel wall thickening. Normal appearance of the terminal ileum. The appendix is not visualized, however there is no pericecal inflammatory change. Small hiatal hernia with suspected small distal esophageal varices. No discrete areas of intraluminal contrast extravasation to suggest reported history of GI bleeding. Lymphatic: Redemonstrated mildly prominent though non pathologically enlarged porta hepatis and gastrohepatic ligament lymph nodes, recent unchanged compared to recent staging CT scan with index gastrohepatic lymph node measuring 0.8 cm in greatest short axis diameter (image 25, series 6). No bulky retroperitoneal, mesenteric, pelvic or inguinal lymphadenopathy. Reproductive: Prostatomegaly with mass effect on the undersurface of the  urinary bladder. There is a small amount of free fluid in the pelvic cul-de-sac. Other: Regional soft tissues appear normal. Musculoskeletal: No acute or aggressive osseous abnormalities. 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 Mild degenerative change of the bilateral hips with joint space loss, subchondral sclerosis and osteophytosis. IMPRESSION: NON-VASCULAR 1. No discrete areas of intraluminal contrast extravasation to suggest the etiology of reported history of GI bleeding. 2. Suspected minimal amount of stranding adjacent to the hepatic flexure of the colon, the etiology of which is not depicted on this examination. Specifically, no evidence of intraluminal contrast extravasation, enteric obstruction or  perforation/definable/drainable fluid collection. 3. 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 4. Suspected distal esophageal varices, again without intraluminal contrast extravasation. Further evaluation with endoscopy could be performed as indicated. VASCULAR 1. Large amount of atherosclerotic plaque throughout the normal caliber abdominal aorta approaching, though not definitively resulting in a hemodynamically significant stenosis. No evidence of abdominal aortic dissection or perivascular stranding. Aortic Atherosclerosis (ICD10-I70.0). 2. Suspected hemodynamically significant narrowing involving the origin and proximal aspects of the SMA however both the celiac and IMA remain patent. 3. Suspected hemodynamically significant narrowings involving the bilateral renal arteries though without associated delayed renal atrophy or asymmetric renal enhancement. 4. Post stenting of the left common iliac artery with suspected hemodynamically significant narrowing at the distal landing zone of the stent.  Suspected hemodynamically significant narrowings involving the left external iliac artery, the left common and imaged portions of the left superficial femoral arteries. Correlation for symptoms of left lower extremity PAD is advised. 5. Suspected hemodynamically significant narrowings involving the right common and external iliac arteries, the right common femoral and imaged portions of the right superficial femoral arteries. Correlation for symptoms of right lower extremity PAD is advised. Electronically Signed   By: Sandi Mariscal M.D.   On: 02/23/2021 15:26    EKG: I have personally reviewed EKG: No EKG ordered.  Assessment/Plan Principal Problem:   Acute GI bleeding Active Problems:   CAD (coronary artery disease)   Acute blood loss anemia   Nicotine dependence, cigarettes, uncomplicated   Centrilobular emphysema (HCC)   Cancer, hepatocellular (HCC)   Hereditary hemochromatosis (HCC)   Iron overload   DNR (do not resuscitate)/DNI(Do Not Intubate)    Acute GI bleeding Admit to progressive care bed.  Telemetry.  Continue with Protonix and octreotide drip.  GI has been consulted.  2 units of packed red blood cells have been ordered by the ER physician.  Monitor H&H every 6 hours.  Keep patient NPO.  IV Rocephin due to patient's prior history of esophageal varices.  Cancer, hepatocellular [facility]) Ex-wife states patient has not undergoing any treatment at this time.  DNR (do not resuscitate)/DNI(Do Not Intubate) Verified with the patient and his ex-wife he is a DNR and DNI.  Nicotine dependence, cigarettes, uncomplicated Chronic.  Patient has no interest in quitting.  Centrilobular emphysema (HCC) Chronic.  Patient does not use supplemental oxygen at home.  Hereditary hemochromatosis ([place]) Patient has been getting weekly phlebotomies due to his iron overload.  Iron overload As above.  Patient has been getting weekly phlebotomies due to his iron overload.  Acute blood loss  anemia Secondary to GI bleed.  He will likely need upper endoscopy plus or minus colonoscopy.  GI has been consulted.  CAD (coronary artery disease) Patient was taking aspirin at home.  We will have to hold this for now due to his GI bleeding.  DVT prophylaxis: SCDs Code Status: DNR/DNI(Do NOT Intubate) verified with the patient and his ex-wife Neoma Laming. Family Communication: Discussed with the patient and his ex-wife at bedside. Disposition Plan: Return home Consults called: GI consulted by EDP. Admission status: Inpatient, Telemetry bed   Kristopher Oppenheim, DO [facility] 02/23/2021, 4:01 PM

## 2021-02-23 NOTE — Assessment & Plan Note (Signed)
Patient has been getting weekly phlebotomies due to his iron overload.

## 2021-02-23 NOTE — Assessment & Plan Note (Signed)
Chronic.  Patient does not use supplemental oxygen at home.

## 2021-02-23 NOTE — Assessment & Plan Note (Signed)
Verified with the patient and his ex-wife he is a DNR and DNI.

## 2021-02-23 NOTE — Consult Note (Addendum)
Inpatient Consultation   Patient ID: Maxwell Edwards is a 65 y.o. male.  Requesting Provider: Dr. 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  Patient is accompanied by his ex-wife who is also his healthcare power of attorney and his daughter at bedside who help provide history.  Patient notes over the last 2 weeks he has had some episodes of dark emesis as well as melena.  As of December 7 his hemoglobin was 11.7.  3 days ago his hemoglobin is 8.1 and on presentation today his hemoglobin was 4.7.  He is not tachycardic and his blood pressure is lower, but systolic still above 299.  He does not take any acid suppressive therapy or beta-blockers at home.  He has lactulose but does not use this on a regular basis.  He currently does not follow with a hepatologist.  He has required phlebotomy for his history of hemochromatosis.  In ED, being administered blood, protonix gtt, octreotide gtt, and ceftriaxone  Denies pepto bismol.  Patient denies dysphagia odynophagia hematemesis and hematochezia.  No reflux.  Family does note jaundice but reports this is baseline.  They have noted episodes of lack of mental clarity/confusion.  He is currently being followed by neurology for tremor  Actively receiving first unit of PRBCs during interview.  BUN: CR 37:1.11, INR 1.4. CTA was performed by the emergency department with no demonstration of extravasation.  Liver lesion was still demonstrated  Active tobacco use. No etoh/drugs On  asa 81 mg Denies NSAIDs, Anti-plt agents, and anticoagulants Denies family history of gastrointestinal disease and malignancy Previous Endoscopies: 05/2020 EGD with Grade 1 varices, portal gastropathy with active oozing. No h/o colonoscopy   Past Medical History:  Diagnosis Date   Barrett esophagus    CAD (coronary artery disease)    Cancer (HCC)    liver cancer   Cirrhosis ([place])    Heart disease    Hereditary hemochromatosis ([place])     Past Surgical History:  Procedure Laterality Date   CORONARY ARTERY BYPASS GRAFT     at 65 years old   ESOPHAGOGASTRODUODENOSCOPY (EGD) WITH PROPOFOL N/A 05/21/2020   Procedure: ESOPHAGOGASTRODUODENOSCOPY (EGD) WITH PROPOFOL;  Surgeon: Lin Landsman, MD;  Location: [facility];  Service: Gastroenterology;  Laterality: N/A;   LAPAROSCOPIC LYSIS OF ADHESIONS  06/12/2020   Procedure: LAPAROSCOPIC LYSIS OF ADHESIONS;  Surgeon: Jules Husbands, MD;  Location: [facility];  Service: General;;   PORTA CATH INSERTION N/A 04/30/2020   Procedure: PORTA CATH INSERTION;  Surgeon: Algernon Huxley, MD;  Location: [facility];  Service: Cardiovascular;  Laterality: N/A;   VEIN BYPASS SURGERY      No Known Allergies  Family History  Problem Relation Age of Onset   Dementia Mother    Ulcers Father    Heart attack Brother    Throat cancer Brother    Heart attack Daughter    Diabetes Daughter    Depression Daughter     Social History   Tobacco Use   Smoking status: Some Days    Packs/day: 0.50  Types: Cigarettes   Smokeless tobacco: Never  Vaping Use   Vaping Use: Never used  Substance Use Topics   Alcohol use: Not Currently   Drug use: Not Currently     Pertinent GI related history and allergies were reviewed with the patient  Review of Systems  Constitutional:  Positive for appetite change (diminished). Negative for activity change, chills, diaphoresis, fatigue, fever and unexpected weight change.  HENT:  Negative for trouble  swallowing and voice change.   Respiratory:  Positive for shortness of breath. Negative for wheezing.   Cardiovascular:  Negative for chest pain, palpitations and leg swelling.  Gastrointestinal:  Positive for blood in stool (melena), diarrhea, nausea and vomiting. Negative for abdominal distention, abdominal pain, anal bleeding and constipation.  Musculoskeletal:  Negative for arthralgias and myalgias.  Skin:  Positive for color change (jaundice). Negative for pallor.  Neurological:  Negative for dizziness, syncope and weakness.  Psychiatric/Behavioral:  Negative for confusion. The patient is not nervous/anxious.   All other systems reviewed and are negative.   Medications Home Medications No current facility-administered medications on file prior to encounter.   Current Outpatient Medications on File Prior to Encounter  Medication Sig Dispense Refill   folic acid (FOLVITE) 1 MG tablet Take 2 tablets (2 mg total) by mouth daily. 60 tablet 4   lidocaine-prilocaine (EMLA) cream Apply to affected area once 30 g 3   Pertinent GI related medications were reviewed with the patient  Inpatient Medications  Current Facility-Administered Medications:    metoCLOPramide (REGLAN) injection 10 mg, 10 mg, Intravenous, Q6H, Annamaria Helling, DO, 10 mg at 02/23/21 1601   octreotide (SANDOSTATIN) 2 mcg/mL load via infusion 50 mcg, 50 mcg, Intravenous, Once **AND** octreotide (SANDOSTATIN) 500 mcg in sodium chloride 0.9 % 250 mL (2 mcg/mL) infusion, 50 mcg/hr, Intravenous, Continuous, Lucrezia Starch, MD   Derrill Memo ON 02/27/2021] pantoprazole (PROTONIX) injection 40 mg, 40 mg, Intravenous, Q12H, Lucrezia Starch, MD   pantoprozole (PROTONIX) 80 mg /NS 100 mL infusion, 8 mg/hr, Intravenous, Continuous, Lucrezia Starch, MD  Current Outpatient Medications:    folic acid (FOLVITE) 1 MG tablet, Take 2 tablets (2 mg total) by mouth daily., Disp: 60 tablet, Rfl: 4   lidocaine-prilocaine (EMLA) cream,  Apply to affected area once, Disp: 30 g, Rfl: 3  octreotide  (SANDOSTATIN)    IV infusion     pantoprazole         Objective   Vitals:   02/23/21 1430 02/23/21 1454 02/23/21 1500 02/23/21 1512  BP: 101/88 (!) 85/47 (!) 97/49 (!) 107/49  Pulse: (!) 103 100 70 92  Resp: 19 18 (!) 21 19  Temp:  98.3 F (36.8 C)  (!) 97.5 F (36.4 C)  TempSrc:  Oral  Oral  SpO2: 100%  100%   Weight:      Height:         Physical Exam Vitals and nursing note reviewed.  Constitutional:      General: He is not in acute distress.    Appearance: He is ill-appearing (chronically). He is not toxic-appearing or diaphoretic.     Comments: Frail appearing; thin  HENT:     Head: Normocephalic and atraumatic.     Nose: Nose normal.     Mouth/Throat:     Mouth: Mucous membranes are moist.     Pharynx: Oropharynx is clear.     Comments: Edentulous. Sublingual telangiectasias Eyes:     General: Scleral icterus present.     Extraocular Movements:  Extraocular movements intact.  Cardiovascular:     Rate and Rhythm: Regular rhythm. Tachycardia present.     Heart sounds: Murmur heard.    No friction rub. No gallop.  Pulmonary:     Effort: Pulmonary effort is normal. No respiratory distress.     Breath sounds: No wheezing, rhonchi or rales.     Comments: Diminished bilaterally Abdominal:     General: Abdomen is flat. Bowel sounds are normal. There is no distension.     Palpations: Abdomen is soft.     Tenderness: There is no abdominal tenderness. There is no guarding or rebound.  Musculoskeletal:     Cervical back: Neck supple.     Right lower leg: No edema.     Left lower leg: No edema.  Skin:    General: Skin is warm and dry.     Coloration: Skin is jaundiced and pale.  Neurological:     Mental Status: He is alert and oriented to person, place, and time. Mental status is at baseline.     Comments: + asterixis  Psychiatric:        Mood and Affect: Mood normal.        Behavior: Behavior normal.         Thought Content: Thought content normal.        Judgment: Judgment normal.    Laboratory Data Recent Labs  Lab 02/20/21 0842 02/23/21 1339  WBC  --  15.6*  HGB 8.1* 4.7*  HCT 25.8* 15.9*  PLT  --  261   Recent Labs  Lab 02/23/21 1339  NA 135  K 4.3  CL 104  CO2 18*  BUN 37*  CALCIUM 8.0*  PROT 6.0*  BILITOT 0.7  ALKPHOS 189*  ALT 38  AST 55*  GLUCOSE 133*   Recent Labs  Lab 02/23/21 1339  INR 1.4*    No results for input(s): LIPASE in the last 72 hours.      Imaging Studies: CT Angio Abd/Pel W and/or Wo Contrast  Result Date: 02/23/2021 CLINICAL DATA:  Lower GI bleeding. History of hepatocellular carcinoma. EXAM: CTA ABDOMEN AND PELVIS WITHOUT AND WITH CONTRAST TECHNIQUE: Multidetector CT imaging of the abdomen and pelvis was performed using the standard protocol during bolus administration of intravenous contrast. Multiplanar reconstructed images and MIPs were obtained and reviewed to evaluate the vascular anatomy. CONTRAST:  134m OMNIPAQUE IOHEXOL 350 MG/ML SOLN COMPARISON:  CT the chest, abdomen pelvis-01/28/2021; 10/09/2020 FINDINGS: VASCULAR Aorta: There is a large amount of irregular calcified and noncalcified atherosclerotic plaque throughout the abdominal aorta approaching though not definitively resulting in a hemodynamically significant stenosis. No evidence of abdominal aortic dissection or perivascular stranding. Celiac: Mixed calcified and noncalcified atherosclerotic plaque involves the origin the celiac artery, not resulting in hemodynamically significant stenosis. Conventional branching pattern. SMA: Mixed calcified and noncalcified atherosclerotic plaque involves the origin, proximal aspect and main trunk of the SMA resulting in tandem areas of at least 50% luminal narrowing (representative axial images 74 and 78, series 5). Conventional branching pattern. The distal tributaries of the SMA appear widely patent without discrete lumen filling defect  to suggest distal embolism. Renals: Solitary bilaterally; there is a moderate to large amount of eccentric irregular mixed calcified and noncalcified atherosclerotic plaque involving the origin of the right renal artery resulting in suspected severe narrowing (coronal image 72, series 9). There is a large amount of mixed calcified and noncalcified atherosclerotic plaque throughout the left renal artery resulting in tandem areas of at least 50%  luminal narrowing (representative coronal image 78, series 9). These findings are without associated asymmetric renal atrophy or delayed renal enhancement. No vessel irregularity to suggest FMD. IMA: Diseased at its origin though remains patent. Inflow: Post stenting of the left common iliac artery with suspected hemodynamically significant narrowing at its distal aspect (coronal image 79, series 9). Eccentric calcified and noncalcified atherosclerotic plaque results in tandem areas of approximately 50% luminal narrowing involving the left external iliac artery. Moderate amount of mixed calcified and noncalcified atherosclerotic plaque results in tandem areas of at least 50% luminal narrowing throughout the right common and external iliac arteries. Proximal Outflow: Suspected hemodynamically significant narrowings involving the bilateral common and imaged portions of the bilateral superficial femoral arteries. Veins: The IVC and pelvic venous systems appear widely patent. Review of the MIP images confirms the above findings. _________________________________________________________ NON-VASCULAR Lower chest: Limited visualization of the lower thorax demonstrates residual ill-defined ground-glass opacities within the bilateral lung bases, improved compared to the 01/28/2021 examination. Minimal nodular atelectasis within the left costophrenic angle. No discrete focal airspace opacities. No pleural effusion. Normal heart size. Diffuse decreased attenuation of the intra cardiac  blood pool suggestive of anemia. Hepatobiliary: Nodularity of the hepatic contour compatible with known history of cirrhosis. Ill-defined hypoattenuating mass within the left lobe of the liver is grossly unchanged compared to recent examination performed 01/28/2021 measuring at least 7.8 x 5.3 by 6.0 cm (axial image 17, series 6; coronal image 36, series 9, as is a satellite lesion within the dome of the right lobe of the liver measuring 2.4 cm (image 10, series 6). No new discrete hepatic lesions. Normal appearance of the gallbladder given degree distention. No radiopaque gallstones. No intra or extrahepatic biliary duct dilatation. 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 No ascites. Pancreas: Normal caliber of the abdominal aorta. Spleen: Redemonstrated splenomegaly with the spleen measuring 15 cm in length (image 25, series 6). Adrenals/Urinary Tract: There is symmetric enhancement of the bilateral kidneys. No evidence of nephrolithiasis. No discrete renal lesions. No urine obstruction or perinephric stranding. Normal appearance of the bilateral adrenal glands. Normal appearance of the urinary bladder given degree of distention. Stomach/Bowel: Large colonic stool burden, particularly within the rectal vault. There is apparent ill-defined stranding seen adjacent to the hepatic flexure of the colon within the right upper abdominal quadrant (coronal image 55, series 14; axial image 43, series 6) otherwise, no discrete areas of bowel wall thickening. Normal appearance of the terminal ileum. The appendix is not visualized, however there is no pericecal inflammatory change. Small hiatal hernia with suspected small distal esophageal varices. No discrete areas of intraluminal contrast extravasation to suggest reported history of GI bleeding. Lymphatic: Redemonstrated mildly prominent though non pathologically enlarged porta hepatis and gastrohepatic ligament lymph nodes, recent unchanged  compared to recent staging CT scan with index gastrohepatic lymph node measuring 0.8 cm in greatest short axis diameter (image 25, series 6). No bulky retroperitoneal, mesenteric, pelvic or inguinal lymphadenopathy. Reproductive: Prostatomegaly with mass effect on the undersurface of the urinary bladder. There is a small amount of free fluid in the pelvic cul-de-sac. Other: Regional soft tissues appear normal. Musculoskeletal: No acute or aggressive osseous abnormalities. 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 Mild degenerative change of the bilateral hips with joint space loss, subchondral sclerosis and osteophytosis. IMPRESSION: NON-VASCULAR 1. No discrete areas of intraluminal contrast extravasation to suggest the etiology of reported history of GI bleeding. 2. Suspected minimal amount of stranding adjacent to the  hepatic flexure of the colon, the etiology of which is not depicted on this examination. Specifically, no evidence of intraluminal contrast extravasation, enteric obstruction or perforation/definable/drainable fluid collection. 3. 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 4. Suspected distal esophageal varices, again without intraluminal contrast extravasation. Further evaluation with endoscopy could be performed as indicated. VASCULAR 1. Large amount of atherosclerotic plaque throughout the normal caliber abdominal aorta approaching, though not definitively resulting in a hemodynamically significant stenosis. No evidence of abdominal aortic dissection or perivascular stranding. Aortic Atherosclerosis (ICD10-I70.0). 2. Suspected hemodynamically significant narrowing involving the origin and proximal aspects of the SMA however both the celiac and IMA remain patent. 3. Suspected hemodynamically  significant narrowings involving the bilateral renal arteries though without associated delayed renal atrophy or asymmetric renal enhancement. 4. Post stenting of the left common iliac artery with suspected hemodynamically significant narrowing at the distal landing zone of the stent. Suspected hemodynamically significant narrowings involving the left external iliac artery, the left common and imaged portions of the left superficial femoral arteries. Correlation for symptoms of left lower extremity PAD is advised. 5. Suspected hemodynamically significant narrowings involving the right common and external iliac arteries, the right common femoral and imaged portions of the right superficial femoral arteries. Correlation for symptoms of right lower extremity PAD is advised. Electronically Signed   By: Sandi Mariscal M.D.   On: 02/23/2021 15:26    Assessment:  # UGIB - ddx includes varices (although less likely as this has been going on for two weeks per patient), ulceration, gastropathy, dieulafoy, gastritis - ct noting small eso varices; grade 1 w/o stigmata in march 2022  # acute blood loss anemia likely 2/2 UGIB - hgb 11.7 on 12/7--> 8.1 on 12/14 --> 4.7 today - receiving blood in ED - BUN Cr 37/1.11 - h/o grade 1 eso varices and portal gastropathy (oozing) in March of 2022 - takes 52 asa daily; no ppi use  # Decompensated cirrhosis 2/2 hereditary hemochromatosis  # Portal htn with sequelae - esophageal varices, gastropathy, coagulopathy, hyperbili, hypoamm, hyponatremia, thrombocytopenia, ?HE  # HCC- on immunotherapy followed by oncology  # Lactic Acidosis  # elevated alk phos- likely related to [facility]  # AKI  # tobacco dependence # CAD s/p cabg- on asa # barretts esophagus  Plan:  Plan for EGD.  Patient will need to undergo adequate resuscitation prior to endoscopy Currently receiving prbc Monitor H&H.  Transfusion and resuscitation as per primary team.  Avoid too frequent lab draws to  prevent further lab induced anemia LFT, CBC, INR daily Goal hemoglobin between 7 and 9. Protonix IV drip after bolus Octreotide drip after bolus for 72 hours Ceftriaxone 1 g for 7 days Reglan 10 mg q 6 h x3 to help clear stomach to improve visualization NPO If patient develops massive hematemesis will need to undergo intubation for procedure Maintain at least two sites of iv access IN room, patient, ex-wife (POA) and daughter discussed resuscitation status as well and patient would like to remain DNR Supportive care as per primary team  Esophagogastroduodenoscopy with possible biopsy, control of bleeding, polypectomy, and interventions as necessary has been discussed with the patient/patient representative. Informed consent was obtained from the patient/patient representative after explaining the indication, nature, and risks of the procedure including but not limited to death, bleeding, perforation, missed neoplasm/lesions, cardiorespiratory compromise, and reaction to medications.  Opportunity for questions was given and appropriate answers were provided. Patient/patient representative has verbalized understanding is amenable to undergoing the procedure.  Imaging and labs reviewed with family members and patient  I personally performed the service.  Management of other medical comorbidities as per primary team  Thank you for allowing [place] to participate in this patient's care. Please don't hesitate to call if any questions or concerns arise.   Annamaria Helling, DO [facility]  Portions of the record may have been created with voice recognition software. Occasional wrong-word or 'sound-a-like' substitutions may have occurred due to the inherent limitations of voice recognition software.  Read the chart carefully and recognize, using context, where substitutions may have occurred.

## 2021-02-23 NOTE — ED Provider Notes (Addendum)
[facility] Emergency Department Provider Note  ____________________________________________   Event Date/Time   First MD Initiated Contact with Patient 02/23/21 1333     (approximate)  I have reviewed the triage vital signs and the nursing notes.   HISTORY  Chief Complaint Hematemesis   HPI Maxwell Edwards is a 65 y.o. male with a past medical history of  CAD s/p CABG , Barrett's esophagus, hereditary hemochromatosis , Parkinson's and liver cancer currently receiving outpatient chemotherapy  who presents EMS from home for assessment of 2 to 3 days of vomiting and diarrhea.  Patient states his stools have been black and he is not sure if there is blood in his vomit or not.  Per EMS there was large blood clots around patient when they got to him in his home but he states he just vomited up.  Patient states he has been feeling little dizzy over the last couple days but denies any chest pain, cough, shortness of breath, fevers, headache, earache, sore throat, passing out, falls or injuries, abdominal pain, back pain, rash or extremity pain.  He denies any regular EtOH use, NSAID use or illicit drug use.  He is not on any blood thinners.  No prior similar episodes.  No other acute concerns at this time.  Patient currently receiving regular phlebotomy for iron overload.         Past Medical History:  Diagnosis Date   Barrett esophagus    CAD (coronary artery disease)    Cancer ([place])    liver cancer   Cirrhosis ([place])    Heart disease    Hereditary hemochromatosis ([place])     Patient Active Problem List   Diagnosis Date Noted   Acute GI bleeding 02/23/2021   Parkinsonism ([place]) 01/18/2021   Hereditary hemochromatosis ([place]) 05/25/2020   Iron overload 05/25/2020   Esophageal varices without bleeding ([place])    Goals of care, counseling/discussion 04/21/2020   Cancer, hepatocellular ([place]) 04/21/2020   History of MI (myocardial infarction) 04/06/2020    Abnormal weight loss 04/06/2020   Centrilobular emphysema ([place]) 04/06/2020   Aortic atherosclerosis ([place]) 04/06/2020   Cirrhosis of liver ([place]) 04/06/2020   CAD (coronary artery disease) 09/04/2019   Nicotine dependence, cigarettes, uncomplicated 00/37/0488   Hyperlipidemia 06/12/2010   Hypertension, benign 06/12/2010    Past Surgical History:  Procedure Laterality Date   CORONARY ARTERY BYPASS GRAFT     at 65 years old   ESOPHAGOGASTRODUODENOSCOPY (EGD) WITH PROPOFOL N/A 05/21/2020   Procedure: ESOPHAGOGASTRODUODENOSCOPY (EGD) WITH PROPOFOL;  Surgeon: Lin Landsman, MD;  Location: [facility];  Service: Gastroenterology;  Laterality: N/A;   LAPAROSCOPIC LYSIS OF ADHESIONS  06/12/2020   Procedure: LAPAROSCOPIC LYSIS OF ADHESIONS;  Surgeon: Jules Husbands, MD;  Location: [facility];  Service: General;;   PORTA CATH INSERTION N/A 04/30/2020   Procedure: PORTA CATH INSERTION;  Surgeon: Algernon Huxley, MD;  Location: [facility];  Service: Cardiovascular;  Laterality: N/A;   VEIN BYPASS SURGERY      Prior to Admission medications   Medication Sig Start Date End Date Taking? Authorizing Provider  fluticasone (FLONASE) 50 MCG/ACT nasal spray Place into both nostrils. Patient not taking: Reported on 11/16/2020 06/04/20   [provider]  folic acid (FOLVITE) 1 MG tablet Take 2 tablets (2 mg total) by mouth daily. 11/15/20   Sindy Guadeloupe, MD  lactulose (CHRONULAC) 10 GM/15ML solution Take 15 mLs (10 g total) by mouth daily. Patient not taking: Reported on 11/16/2020  08/17/20   Borders, Kirt Boys, NP  lidocaine-prilocaine (EMLA) cream Apply to affected area once 04/21/20   Sindy Guadeloupe, MD  omeprazole (PRILOSEC) 40 MG capsule Take 1 capsule by mouth every morning. Patient not taking: Reported on 09/14/2020 06/30/20   [provider]  rOPINIRole (REQUIP) 0.25 MG tablet Take 0.31m 3x per day for 1 day, then 0.584m3x per day for 1 day, then 0.7594mx per day for 1  day Patient not taking: Reported on 02/20/2021 02/04/21   VasVentura SellersD  rOPINIRole (REQUIP) 1 MG tablet Continuing titration, take 1mg43m per day x7 days. Then increase to 2mg 40mper day x7 days Patient not taking: Reported on 02/20/2021 02/04/21   VasloVentura Sellers   Allergies Patient has no known allergies.  Family History  Problem Relation Age of Onset   Dementia Mother    Ulcers Father    Heart attack Brother    Throat cancer Brother    Heart attack Daughter    Diabetes Daughter    Depression Daughter     Social History Social History   Tobacco Use   Smoking status: Some Days    Packs/day: 0.50    Types: Cigarettes   Smokeless tobacco: Never  Vaping Use   Vaping Use: Never used  Substance Use Topics   Alcohol use: Not Currently   Drug use: Not Currently    Review of Systems  Review of Systems  Constitutional:  Negative for chills and fever.  HENT:  Negative for sore throat.   Eyes:  Negative for pain.  Respiratory:  Negative for cough and stridor.   Cardiovascular:  Negative for chest pain.  Gastrointestinal:  Positive for melena, nausea and vomiting.  Genitourinary:  Negative for dysuria.  Musculoskeletal:  Negative for myalgias.  Skin:  Negative for rash.  Neurological:  Positive for dizziness. Negative for seizures, loss of consciousness and headaches.  Psychiatric/Behavioral:  Negative for suicidal ideas.   All other systems reviewed and are negative.    ____________________________________________   PHYSICAL EXAM:  VITAL SIGNS: ED Triage Vitals  Enc Vitals Group     BP      Pulse      Resp      Temp      Temp src      SpO2      Weight      Height      Head Circumference      Peak Flow      Pain Score      Pain Loc      Pain Edu?      Excl. in GC?  [place]:   02/23/21 1500 02/23/21 1512  BP: (!) 97/49 (!) 107/49  Pulse: 70 92  Resp: (!) 21 19  Temp:  (!) 97.5 F (36.4 C)  SpO2: 100%    Physical Exam Vitals and  nursing note reviewed.  Constitutional:      General: He is not in acute distress.    Appearance: He is well-developed.  HENT:     Head: Normocephalic and atraumatic.     Right Ear: External ear normal.     Left Ear: External ear normal.     Nose: Nose normal.     Mouth/Throat:     Mouth: Mucous membranes are dry.  Eyes:     Conjunctiva/sclera: Conjunctivae normal.  Cardiovascular:     Rate and Rhythm: Normal rate and regular rhythm.  Heart sounds: No murmur heard. Pulmonary:     Effort: Pulmonary effort is normal. No respiratory distress.     Breath sounds: Normal breath sounds.  Abdominal:     Palpations: Abdomen is soft.     Tenderness: There is no abdominal tenderness.  Musculoskeletal:        General: No swelling.     Cervical back: Neck supple.  Skin:    General: Skin is warm and dry.     Capillary Refill: Capillary refill takes 2 to 3 seconds.     Coloration: Skin is jaundiced.  Neurological:     Mental Status: He is alert.  Psychiatric:        Mood and Affect: Mood normal.     ____________________________________________   LABS (all labs ordered are listed, but only abnormal results are displayed)  Labs Reviewed  CBC WITH DIFFERENTIAL/PLATELET - Abnormal; Notable for the following components:      Result Value   WBC 15.6 (*)    RBC 1.68 (*)    Hemoglobin 4.7 (*)    HCT 15.9 (*)    MCHC 29.6 (*)    RDW 18.1 (*)    nRBC 0.3 (*)    Neutro Abs 11.9 (*)    Monocytes Absolute 1.5 (*)    Abs Immature Granulocytes 0.11 (*)    All other components within normal limits  COMPREHENSIVE METABOLIC PANEL - Abnormal; Notable for the following components:   CO2 18 (*)    Glucose, Bld 133 (*)    BUN 37 (*)    Calcium 8.0 (*)    Total Protein 6.0 (*)    Albumin 2.5 (*)    AST 55 (*)    Alkaline Phosphatase 189 (*)    All other components within normal limits  PROTIME-INR - Abnormal; Notable for the following components:   Prothrombin Time 17.0 (*)    INR 1.4  (*)    All other components within normal limits  RESP PANEL BY RT-PCR (FLU A&B, COVID) ARPGX2  CULTURE, BLOOD (ROUTINE X 2)  CULTURE, BLOOD (ROUTINE X 2)  AMMONIA  LACTIC ACID, PLASMA  LACTIC ACID, PLASMA  APTT  TYPE AND SCREEN  ABO/RH  PREPARE RBC (CROSSMATCH)   ____________________________________________  EKG  ____________________________________________  RADIOLOGY  ED MD interpretation: No active extravasation seen on my interpretation of CT angio abdomen pelvis.  There is a large amount of atherosclerotic plaque and significant narrowing involving the SMA and both celiac's.  There is narrowing of the bilateral arteries as well.  Some stranding about the hepatic flexure of the colon and previously seen cirrhosis, hepatocellular carcinoma with collection occlusion of the left portal vein and splenomegaly as well as distal esophageal varices without extravasation.  Official radiology report(s): CT Angio Abd/Pel W and/or Wo Contrast  Result Date: 02/23/2021 CLINICAL DATA:  Lower GI bleeding. History of hepatocellular carcinoma. EXAM: CTA ABDOMEN AND PELVIS WITHOUT AND WITH CONTRAST TECHNIQUE: Multidetector CT imaging of the abdomen and pelvis was performed using the standard protocol during bolus administration of intravenous contrast. Multiplanar reconstructed images and MIPs were obtained and reviewed to evaluate the vascular anatomy. CONTRAST:  19m OMNIPAQUE IOHEXOL 350 MG/ML SOLN COMPARISON:  CT the chest, abdomen pelvis-01/28/2021; 10/09/2020 FINDINGS: VASCULAR Aorta: There is a large amount of irregular calcified and noncalcified atherosclerotic plaque throughout the abdominal aorta approaching though not definitively resulting in a hemodynamically significant stenosis. No evidence of abdominal aortic dissection or perivascular stranding. Celiac: Mixed calcified and noncalcified atherosclerotic plaque involves the origin  the celiac artery, not resulting in hemodynamically  significant stenosis. Conventional branching pattern. SMA: Mixed calcified and noncalcified atherosclerotic plaque involves the origin, proximal aspect and main trunk of the SMA resulting in tandem areas of at least 50% luminal narrowing (representative axial images 74 and 78, series 5). Conventional branching pattern. The distal tributaries of the SMA appear widely patent without discrete lumen filling defect to suggest distal embolism. Renals: Solitary bilaterally; there is a moderate to large amount of eccentric irregular mixed calcified and noncalcified atherosclerotic plaque involving the origin of the right renal artery resulting in suspected severe narrowing (coronal image 72, series 9). There is a large amount of mixed calcified and noncalcified atherosclerotic plaque throughout the left renal artery resulting in tandem areas of at least 50% luminal narrowing (representative coronal image 78, series 9). These findings are without associated asymmetric renal atrophy or delayed renal enhancement. No vessel irregularity to suggest FMD. IMA: Diseased at its origin though remains patent. Inflow: Post stenting of the left common iliac artery with suspected hemodynamically significant narrowing at its distal aspect (coronal image 79, series 9). Eccentric calcified and noncalcified atherosclerotic plaque results in tandem areas of approximately 50% luminal narrowing involving the left external iliac artery. Moderate amount of mixed calcified and noncalcified atherosclerotic plaque results in tandem areas of at least 50% luminal narrowing throughout the right common and external iliac arteries. Proximal Outflow: Suspected hemodynamically significant narrowings involving the bilateral common and imaged portions of the bilateral superficial femoral arteries. Veins: The IVC and pelvic venous systems appear widely patent. Review of the MIP images confirms the above findings.  _________________________________________________________ NON-VASCULAR Lower chest: Limited visualization of the lower thorax demonstrates residual ill-defined ground-glass opacities within the bilateral lung bases, improved compared to the 01/28/2021 examination. Minimal nodular atelectasis within the left costophrenic angle. No discrete focal airspace opacities. No pleural effusion. Normal heart size. Diffuse decreased attenuation of the intra cardiac blood pool suggestive of anemia. Hepatobiliary: Nodularity of the hepatic contour compatible with known history of cirrhosis. Ill-defined hypoattenuating mass within the left lobe of the liver is grossly unchanged compared to recent examination performed 01/28/2021 measuring at least 7.8 x 5.3 by 6.0 cm (axial image 17, series 6; coronal image 36, series 9, as is a satellite lesion within the dome of the right lobe of the liver measuring 2.4 cm (image 10, series 6). No new discrete hepatic lesions. Normal appearance of the gallbladder given degree distention. No radiopaque gallstones. No intra or extrahepatic biliary duct dilatation. 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 No ascites. Pancreas: Normal caliber of the abdominal aorta. Spleen: Redemonstrated splenomegaly with the spleen measuring 15 cm in length (image 25, series 6). Adrenals/Urinary Tract: There is symmetric enhancement of the bilateral kidneys. No evidence of nephrolithiasis. No discrete renal lesions. No urine obstruction or perinephric stranding. Normal appearance of the bilateral adrenal glands. Normal appearance of the urinary bladder given degree of distention. Stomach/Bowel: Large colonic stool burden, particularly within the rectal vault. There is apparent ill-defined stranding seen adjacent to the hepatic flexure of the colon within the right upper abdominal quadrant (coronal image 55, series 14; axial image 43, series 6) otherwise, no discrete areas of bowel  wall thickening. Normal appearance of the terminal ileum. The appendix is not visualized, however there is no pericecal inflammatory change. Small hiatal hernia with suspected small distal esophageal varices. No discrete areas of intraluminal contrast extravasation to suggest reported history of GI bleeding. Lymphatic: Redemonstrated mildly prominent though non pathologically enlarged  porta hepatis and gastrohepatic ligament lymph nodes, recent unchanged compared to recent staging CT scan with index gastrohepatic lymph node measuring 0.8 cm in greatest short axis diameter (image 25, series 6). No bulky retroperitoneal, mesenteric, pelvic or inguinal lymphadenopathy. Reproductive: Prostatomegaly with mass effect on the undersurface of the urinary bladder. There is a small amount of free fluid in the pelvic cul-de-sac. Other: Regional soft tissues appear normal. Musculoskeletal: No acute or aggressive osseous abnormalities. 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 Mild degenerative change of the bilateral hips with joint space loss, subchondral sclerosis and osteophytosis. IMPRESSION: NON-VASCULAR 1. No discrete areas of intraluminal contrast extravasation to suggest the etiology of reported history of GI bleeding. 2. Suspected minimal amount of stranding adjacent to the hepatic flexure of the colon, the etiology of which is not depicted on this examination. Specifically, no evidence of intraluminal contrast extravasation, enteric obstruction or perforation/definable/drainable fluid collection. 3. 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 4. Suspected distal esophageal varices, again without intraluminal contrast extravasation. Further evaluation with endoscopy could be performed as indicated. VASCULAR 1. Large  amount of atherosclerotic plaque throughout the normal caliber abdominal aorta approaching, though not definitively resulting in a hemodynamically significant stenosis. No evidence of abdominal aortic dissection or perivascular stranding. Aortic Atherosclerosis (ICD10-I70.0). 2. Suspected hemodynamically significant narrowing involving the origin and proximal aspects of the SMA however both the celiac and IMA remain patent. 3. Suspected hemodynamically significant narrowings involving the bilateral renal arteries though without associated delayed renal atrophy or asymmetric renal enhancement. 4. Post stenting of the left common iliac artery with suspected hemodynamically significant narrowing at the distal landing zone of the stent. Suspected hemodynamically significant narrowings involving the left external iliac artery, the left common and imaged portions of the left superficial femoral arteries. Correlation for symptoms of left lower extremity PAD is advised. 5. Suspected hemodynamically significant narrowings involving the right common and external iliac arteries, the right common femoral and imaged portions of the right superficial femoral arteries. Correlation for symptoms of right lower extremity PAD is advised. Electronically Signed   By: Sandi Mariscal M.D.   On: 02/23/2021 15:26    ____________________________________________   PROCEDURES  Procedure(s) performed (including Critical Care):  .Critical Care Performed by: Lucrezia Starch, MD Authorized by: Lucrezia Starch, MD   Critical care provider statement:    Critical care time (minutes):  30   Critical care was necessary to treat or prevent imminent or life-threatening deterioration of the following conditions:  Circulatory failure   Critical care was time spent personally by me on the following activities:  Development of treatment plan with patient or surrogate, discussions with consultants, evaluation of patient's response to treatment,  examination of patient, ordering and review of laboratory studies, ordering and review of radiographic studies, ordering and performing treatments and interventions, pulse oximetry, re-evaluation of patient's condition and review of old charts   ____________________________________________   INITIAL IMPRESSION / [place] / ED COURSE        Patient presents with above-stated history exam for assessment of dizziness associate with nausea and bloody emesis per EMS as well as melanotic stools per patient.  On arrival he is afebrile hemodynamically stable.  He does appear fairly jaundice but is awake and alert.  No significant abdominal tenderness or active bleeding noted at this time.  Per review of records it seems that patient's ex-wife checked on him this morning and noted he had been throwing up black emesis for 2 days.  In addition on review of records it seems she had an endoscopy performed on 3/14 by Dr. Marius Ditch that showed some gastritis but no varices.  Certainly concerned about recurrence of bleeding from gastritis versus peptic ulcer disease versus development of ascites given patient has fairly advanced liver cancer.  It is certainly possible this is from a lower source although I am more concerned about upper source at this time given history of more prominent vomiting.  Patient immediately started on Protonix on arrival.  His pressure is stable.  CBC shows a hemoglobin of 4.7 with WBC count of 15.6 and platelets of 261.  CMP remarkable for bicarb of 18, BUN of 37, albumin 2.5, AST of 55 and alk phos of 189 without other significant electrolyte or metabolic derangements.  Discussed patient with on-call gastroenterologist Dr. Virgina Jock who recommended adding Rocephin and octreotide which were ordered.    I will admit to medicine service for further evaluation and management.  Time mission patient is pending formal read from radiology of CT abdomen pelvis.          ____________________________________________   FINAL CLINICAL IMPRESSION(S) / ED DIAGNOSES  Final diagnoses:  Gastrointestinal hemorrhage, unspecified gastrointestinal hemorrhage type  Low hemoglobin    Medications  pantoprozole (PROTONIX) 80 mg /NS 100 mL infusion (has no administration in time range)  pantoprazole (PROTONIX) injection 40 mg (has no administration in time range)  cefTRIAXone (ROCEPHIN) 1 g in sodium chloride 0.9 % 100 mL IVPB (has no administration in time range)  octreotide (SANDOSTATIN) 2 mcg/mL load via infusion 50 mcg (has no administration in time range)    And  octreotide (SANDOSTATIN) 500 mcg in sodium chloride 0.9 % 250 mL (2 mcg/mL) infusion (has no administration in time range)  ondansetron (ZOFRAN) injection 4 mg (has no administration in time range)  metoCLOPramide (REGLAN) injection 10 mg (has no administration in time range)  pantoprazole (PROTONIX) 80 mg /NS 100 mL IVPB (0 mg Intravenous Stopped 02/23/21 1454)  0.9 %  sodium chloride infusion (Manually program via Guardrails IV Fluids) ( Intravenous New Bag/Given 02/23/21 1509)  iohexol (OMNIPAQUE) 350 MG/ML injection 100 mL (100 mLs Intravenous Contrast Given 02/23/21 1445)     ED Discharge Orders     None        Note:  This document was prepared using Dragon voice recognition software and may include unintentional dictation errors.    Lucrezia Starch, MD 02/23/21 1529    Lucrezia Starch, MD 02/23/21 [phone]

## 2021-02-23 NOTE — Subjective & Objective (Signed)
Chief complaint: Hematemesis History of present illness: 64 year old male with a history of hepatocellular carcinoma, coronary disease, hereditary hemochromatosis on weekly phlebotomy, history of esophageal varices without bleeding, COPD who presents to the ER today with a week history of black tarry stools and 2-day history of hematemesis.  Patient's ex-wife is is also his caretaker.  She states that patient told her about the bloody stools today.  She has not seen them in about 3 to 4 days.  Patient states he has been vomiting blood for the last 2 days.  Ex-wife states that he drank a little bit of water today and started vomiting black blood today.  Patient has felt tired.  No abdominal pain.  No chest pain.  On arrival to the ER, temperature 98.5 heart rate 96 blood pressure 101/61.  Repeat blood pressure 85/47 and then again 97/49.  Patient started on 1 of 2 unit packed red blood transfusions in the ER.  GI has been consulted by EDP  Due to the patient's acute blood loss anemia, GI bleed, [facility] hospitalist contacted for admission.

## 2021-02-23 NOTE — Assessment & Plan Note (Signed)
Admit to progressive care bed.  Telemetry.  Continue with Protonix and octreotide drip.  GI has been consulted.  2 units of packed red blood cells have been ordered by the ER physician.  Monitor H&H every 6 hours.  Keep patient NPO.  IV Rocephin due to patient's prior history of esophageal varices.

## 2021-02-23 NOTE — ED Triage Notes (Signed)
Pt BIB EMS from home. Vomiting blood x 3 days. Pt has also noticed dark tarry stools at home for the last week as well. [facility] [place] and received treatment last month.   97/56 cancer pt ()liver) BGL 280

## 2021-02-23 NOTE — Telephone Encounter (Signed)
Re- Throwing up blood  Spoke with patient's ex-wife Hassie Bruce who states she came to his house this morning to see how he was doing and he appeared very pale, weak and stated he had been throwing up black blood for the past 2 days.  He was unable to walk from the living room to the kitchen.  States he had fallen several times over the past 24 hours.  She immediately called 911 and EMS was on the way.  I advised that he needs to be taken to [facility] ED. patient was alert and oriented and speaking to me on the phone as well.  He was agreeable to come to the hospital.  3 days ago his counts were already low with a hemoglobin of 8.1.  Will follow in the hospital.  Faythe Casa, NP 02/23/2021 12:28 PM

## 2021-02-23 NOTE — Assessment & Plan Note (Signed)
Ex-wife states patient has not undergoing any treatment at this time.

## 2021-02-23 NOTE — Assessment & Plan Note (Signed)
Secondary to GI bleed.  He will likely need upper endoscopy plus or minus colonoscopy.  GI has been consulted.

## 2021-02-23 NOTE — Assessment & Plan Note (Signed)
As above.  Patient has been getting weekly phlebotomies due to his iron overload.

## 2021-02-23 NOTE — Assessment & Plan Note (Signed)
Chronic.  Patient has no interest in quitting.

## 2021-02-24 ENCOUNTER — Inpatient Hospital Stay: Payer: Medicare Other | Admitting: Registered Nurse

## 2021-02-24 ENCOUNTER — Encounter
Admission: EM | Disposition: A | Discharge status: Home / Self Care | Payer: Self-pay | Source: Home / Self Care | Attending: Internal Medicine

## 2021-02-24 ENCOUNTER — Encounter: Payer: Self-pay | Admitting: Internal Medicine

## 2021-02-24 DIAGNOSIS — K766 Portal hypertension: Secondary | ICD-10-CM

## 2021-02-24 DIAGNOSIS — K922 Gastrointestinal hemorrhage, unspecified: Secondary | ICD-10-CM

## 2021-02-24 DIAGNOSIS — I2510 Atherosclerotic heart disease of native coronary artery without angina pectoris: Secondary | ICD-10-CM

## 2021-02-24 DIAGNOSIS — K2270 Barrett's esophagus without dysplasia: Secondary | ICD-10-CM

## 2021-02-24 DIAGNOSIS — D696 Thrombocytopenia, unspecified: Secondary | ICD-10-CM

## 2021-02-24 DIAGNOSIS — C220 Liver cell carcinoma: Secondary | ICD-10-CM

## 2021-02-24 DIAGNOSIS — I8510 Secondary esophageal varices without bleeding: Secondary | ICD-10-CM

## 2021-02-24 DIAGNOSIS — K7460 Unspecified cirrhosis of liver: Principal | ICD-10-CM

## 2021-02-24 DIAGNOSIS — F17210 Nicotine dependence, cigarettes, uncomplicated: Secondary | ICD-10-CM

## 2021-02-24 HISTORY — PX: ESOPHAGOGASTRODUODENOSCOPY (EGD) WITH PROPOFOL: SHX5813

## 2021-02-24 HISTORY — DX: Gastrointestinal hemorrhage, unspecified: K92.2

## 2021-02-24 LAB — PROTIME-INR
INR: 1.4 — ABNORMAL HIGH (ref 0.8–1.2)
Prothrombin Time: 17.2 seconds — ABNORMAL HIGH (ref 11.4–15.2)

## 2021-02-24 LAB — CBC
HCT: 21.6 % — ABNORMAL LOW (ref 39.0–52.0)
HCT: 20.4 % — ABNORMAL LOW (ref 39.0–52.0)
Hemoglobin: 7.1 g/dL — ABNORMAL LOW (ref 13.0–17.0)
Hemoglobin: 6.7 g/dL — ABNORMAL LOW (ref 13.0–17.0)
MCH: 29.8 pg (ref 26.0–34.0)
MCH: 29.9 pg (ref 26.0–34.0)
MCHC: 32.9 g/dL (ref 30.0–36.0)
MCHC: 32.8 g/dL (ref 30.0–36.0)
MCV: 90.8 fL (ref 80.0–100.0)
MCV: 91.1 fL (ref 80.0–100.0)
Platelets: 118 10*3/uL — ABNORMAL LOW (ref 150–400)
Platelets: 113 10*3/uL — ABNORMAL LOW (ref 150–400)
RBC: 2.38 MIL/uL — ABNORMAL LOW (ref 4.22–5.81)
RBC: 2.24 MIL/uL — ABNORMAL LOW (ref 4.22–5.81)
RDW: 15.7 % — ABNORMAL HIGH (ref 11.5–15.5)
RDW: 15.6 % — ABNORMAL HIGH (ref 11.5–15.5)
WBC: 17.5 10*3/uL — ABNORMAL HIGH (ref 4.0–10.5)
WBC: 15.8 10*3/uL — ABNORMAL HIGH (ref 4.0–10.5)
nRBC: 0.3 % — ABNORMAL HIGH (ref 0.0–0.2)
nRBC: 0.4 % — ABNORMAL HIGH (ref 0.0–0.2)

## 2021-02-24 LAB — COMPREHENSIVE METABOLIC PANEL
ALT: 29 U/L (ref 0–44)
AST: 33 U/L (ref 15–41)
Albumin: 2.7 g/dL — ABNORMAL LOW (ref 3.5–5.0)
Alkaline Phosphatase: 127 U/L — ABNORMAL HIGH (ref 38–126)
Anion gap: 5 (ref 5–15)
BUN: 37 mg/dL — ABNORMAL HIGH (ref 8–23)
CO2: 23 mmol/L (ref 22–32)
Calcium: 7.9 mg/dL — ABNORMAL LOW (ref 8.9–10.3)
Chloride: 103 mmol/L (ref 98–111)
Creatinine, Ser: 1.02 mg/dL (ref 0.61–1.24)
GFR, Estimated: >60 mL/min (ref >60)
Glucose, Bld: 117 mg/dL — ABNORMAL HIGH (ref 70–99)
Potassium: 3.9 mmol/L (ref 3.5–5.1)
Sodium: 131 mmol/L — ABNORMAL LOW (ref 135–145)
Total Bilirubin: 2.4 mg/dL — ABNORMAL HIGH (ref 0.3–1.2)
Total Protein: 5.4 g/dL — ABNORMAL LOW (ref 6.5–8.1)

## 2021-02-24 LAB — HEMOGLOBIN AND HEMATOCRIT, BLOOD
HCT: 24.2 % — ABNORMAL LOW (ref 39.0–52.0)
HCT: 23.8 % — ABNORMAL LOW (ref 39.0–52.0)
Hemoglobin: 8 g/dL — ABNORMAL LOW (ref 13.0–17.0)
Hemoglobin: 7.9 g/dL — ABNORMAL LOW (ref 13.0–17.0)

## 2021-02-24 SURGERY — ESOPHAGOGASTRODUODENOSCOPY (EGD) WITH PROPOFOL
Anesthesia: General

## 2021-02-24 MED ORDER — LACTATED RINGERS IV SOLN: INTRAVENOUS | Status: DC | PRN

## 2021-02-24 MED ORDER — PROPOFOL 10 MG/ML IV BOLUS
INTRAVENOUS | Status: DC | PRN
  Administered 2021-02-24: 20 mg via INTRAVENOUS
  Administered 2021-02-24: 60 mg via INTRAVENOUS
  Administered 2021-02-24 (×2): 20 mg via INTRAVENOUS

## 2021-02-24 MED ORDER — ACETAMINOPHEN 650 MG RE SUPP
650.0000 mg | RECTAL | Status: DC | PRN
  Filled 2021-02-24: qty 1

## 2021-02-24 MED ORDER — ALBUMIN HUMAN 25 % IV SOLN
25.0000 g | Freq: Four times a day (QID) | INTRAVENOUS | Status: AC
  Administered 2021-02-24: 03:00:00 25 g via INTRAVENOUS
  Filled 2021-02-24 (×2): qty 100

## 2021-02-24 MED ORDER — DEXMEDETOMIDINE (PRECEDEX) IN NS 20 MCG/5ML (4 MCG/ML) IV SYRINGE
PREFILLED_SYRINGE | INTRAVENOUS | Status: DC | PRN
  Administered 2021-02-24: 8 ug via INTRAVENOUS

## 2021-02-24 MED ORDER — FENTANYL CITRATE PF 50 MCG/ML IJ SOSY: 25.0000 ug | PREFILLED_SYRINGE | INTRAMUSCULAR | Status: DC | PRN

## 2021-02-24 MED ORDER — SODIUM CHLORIDE 0.9 % IV SOLN: 1.0000 g | INTRAVENOUS | Status: DC

## 2021-02-24 MED ORDER — DEXTROSE IN LACTATED RINGERS 5 % IV SOLN: INTRAVENOUS | Status: AC

## 2021-02-24 MED ORDER — SODIUM CHLORIDE 0.9 % IV SOLN: INTRAVENOUS | Status: DC

## 2021-02-24 MED ORDER — ONDANSETRON HCL 4 MG/2ML IJ SOLN: 4.0000 mg | Freq: Four times a day (QID) | INTRAMUSCULAR | Status: DC | PRN

## 2021-02-24 MED ORDER — EPHEDRINE SULFATE 50 MG/ML IJ SOLN
INTRAMUSCULAR | Status: DC | PRN
Start: 2021-02-24 — End: 2021-02-24
  Administered 2021-02-24: 5 mg via INTRAVENOUS

## 2021-02-24 MED ORDER — ONDANSETRON HCL 4 MG/2ML IJ SOLN: 4.0000 mg | Freq: Once | INTRAMUSCULAR | Status: DC | PRN

## 2021-02-24 MED ORDER — SODIUM CHLORIDE 0.9% IV SOLUTION
Freq: Once | INTRAVENOUS | Status: DC
  Filled 2021-02-24: qty 250

## 2021-02-24 MED ORDER — SODIUM CHLORIDE 0.9 % IV SOLN
2.0000 g | INTRAVENOUS | Status: DC
  Administered 2021-02-24 – 2021-02-27 (×4): 2 g via INTRAVENOUS
  Filled 2021-02-24: qty 2
  Filled 2021-02-24: qty 20
  Filled 2021-02-24: qty 2
  Filled 2021-02-24: qty 20

## 2021-02-24 MED ORDER — LIDOCAINE HCL (CARDIAC) PF 100 MG/5ML IV SOSY
PREFILLED_SYRINGE | INTRAVENOUS | Status: DC | PRN
  Administered 2021-02-24: 60 mg via INTRAVENOUS

## 2021-02-24 NOTE — ED Notes (Signed)
Blood started. OR RN here to take pt, report given, pt stable at this time.

## 2021-02-24 NOTE — Op Note (Addendum)
[facility] Gastroenterology Patient Name: Maxwell Edwards Procedure Date: 02/24/2021 9:25 AM MRN: [id number] Account #: [id number] Date of Birth: Jul 09, 1955 Admit Type: Outpatient Age: 65 Room: [facility] ENDO ROOM 2 Gender: Male Note Status: Finalized Instrument Name: Upper Endoscope [id number] Procedure:             Upper GI endoscopy Indications:           Acute post hemorrhagic anemia, Coffee-ground emesis,                         Melena Providers:             Annamaria Helling DO, DO Medicines:             Monitored Anesthesia Care Complications:         No immediate complications. Estimated blood loss:                         Minimal. Procedure:             Pre-Anesthesia Assessment:                        - Prior to the procedure, a History and Physical was                         performed, and patient medications and allergies were                         reviewed. The patient is competent. The risks and                         benefits of the procedure and the sedation options and                         risks were discussed with the patient. All questions                         were answered and informed consent was obtained.                         Patient identification and proposed procedure were                         verified by the physician, the nurse, the anesthetist                         and the technician in the endoscopy suite. Mental                         Status Examination: normal. Airway Examination: normal                         oropharyngeal airway and neck mobility. Respiratory                         Examination: clear to auscultation. CV Examination:                         RRR, no murmurs, no S3 or S4. Prophylactic  Antibiotics: The patient does not require prophylactic                         antibiotics. Prior Anticoagulants: The patient has                         taken no previous anticoagulant or  antiplatelet                         agents. ASA Grade Assessment: IV - A patient with                         severe systemic disease that is a constant threat to                         life. After reviewing the risks and benefits, the                         patient was deemed in satisfactory condition to                         undergo the procedure. The anesthesia plan was to use                         monitored anesthesia care (MAC). Immediately prior to                         administration of medications, the patient was                         re-assessed for adequacy to receive sedatives. The                         heart rate, respiratory rate, oxygen saturations,                         blood pressure, adequacy of pulmonary ventilation, and                         response to care were monitored throughout the                         procedure. The physical status of the patient was                         re-assessed after the procedure.                        After obtaining informed consent, the endoscope was                         passed under direct vision. Throughout the procedure,                         the patient's blood pressure, pulse, and oxygen                         saturations were monitored continuously. The Endoscope  was introduced through the mouth, and advanced to the                         second part of duodenum. The upper GI endoscopy was                         accomplished without difficulty. The patient tolerated                         the procedure well. Findings:      The duodenal bulb, first portion of the duodenum and second portion of       the duodenum were normal. Estimated blood loss: none.      A small hiatal hernia was present. Estimated blood loss: none.      Normal mucosa was found in the stomach. No signs of gastric varices,       GAVE, or portal gastropathy. No blood- fresh or old noted in the gastric        lumen. Estimated blood loss: none.      The esophagus and gastroesophageal junction were examined with white       light and narrow band imaging (NBI). There were esophageal mucosal       changes secondary to established long-segment Barrett's disease,       classified as Barrett's stage C8-M10 per Prague criteria. These changes       involved the mucosa extending to the Z-line. No visible abnormalities       were present. Estimated blood loss: none.      Two Grade II varices were found in the distal esophagus. These       demonstrated stigmata of recent bleeding - each with one nipple sign Two       bands were successfully placed with good proximal decompression of       varices of each varix. There was no bleeding at the end of the procedure       after over a minute of observation.      LA Grade A (one or more mucosal breaks less than 5 mm, not extending       between tops of 2 mucosal folds) esophagitis with no bleeding was found.       Estimated blood loss: none.      The exam was otherwise without abnormality. Impression:            - Normal duodenal bulb, first portion of the duodenum                         and second portion of the duodenum.                        - Small hiatal hernia.                        - Normal mucosa was found in the stomach.                        - Esophageal mucosal changes secondary to established                         long-segment Barrett's disease, classified as  Barrett's stage C8-M10 per Prague criteria.                        - Grade II esophageal varices. Banded.                        - LA Grade A esophagitis with no bleeding.                        - The examination was otherwise normal.                        - No specimens collected. Recommendation:        - Return patient to hospital ward for ongoing care.                        - Clear liquid diet today.                        - Continue present medications.  Contniue octreotide                         and protonix gtts. Continue abx for 7 days total.                        - Repeat upper endoscopy in 2 weeks for retreatment.                        - Return to GI clinic at appointment to be scheduled.                        - The findings and recommendations were discussed with                         the patient's primary physician.                        - The findings and recommendations were discussed with                         the patient's family. Procedure Code(s):     --- Professional ---                        [id number], Esophagogastroduodenoscopy, flexible,                         transoral; with band ligation of esophageal/gastric                         varices Diagnosis Code(s):     --- Professional ---                        K22.70, Barrett's esophagus without dysplasia                        K44.9, Diaphragmatic hernia without obstruction or                         gangrene  I85.00, Esophageal varices without bleeding                        K20.90, Esophagitis, unspecified without bleeding                        D62, Acute posthemorrhagic anemia                        K92.0, Hematemesis                        K92.1, Melena (includes Hematochezia) CPT copyright 2019 [employer]. All rights reserved. The codes documented in this report are preliminary and upon coder review may  be revised to meet current compliance requirements. Attending Participation:      I personally performed the entire procedure. Volney American, DO Annamaria Helling DO, DO 02/24/2021 10:16:06 AM This report has been signed electronically. Number of Addenda: 0 Note Initiated On: 02/24/2021 9:25 AM Estimated Blood Loss:  Estimated blood loss was minimal.      [facility]

## 2021-02-24 NOTE — Transfer of Care (Signed)
Immediate Anesthesia Transfer of Care Note  Patient: Maxwell Edwards  Procedure(s) Performed: ESOPHAGOGASTRODUODENOSCOPY (EGD) WITH PROPOFOL  Patient Location: PACU  Anesthesia Type:General  Level of Consciousness: sedated  Airway & Oxygen Therapy: Patient Spontanous Breathing and Patient connected to face mask oxygen  Post-op Assessment: Report given to RN and Post -op Vital signs reviewed and stable  Post vital signs: Reviewed and stable  Last Vitals:  Vitals Value Taken Time  BP 121/72 02/24/21 1002  Temp 36.4 C 02/24/21 1002  Pulse 67 02/24/21 1002  Resp 28 02/24/21 1002  SpO2 100 % 02/24/21 1002    Last Pain:  Vitals:   02/24/21 0941  TempSrc: Temporal  PainSc: 0-No pain         Complications: No notable events documented.

## 2021-02-24 NOTE — Progress Notes (Signed)
Inpatient Follow-up/Progress Note   Patient ID: Maxwell Edwards is a 65 y.o. male.  Overnight Events / Subjective Findings NAEON. No further vomiting or melena. Hgb initially improved appropriately with 3 u prbc to 7.1. Drifted down to 6.7. Vital signs overall unchanged. He denies abdominal pain and nausea. No hematemesis or hematochezia. NPO since midnight for EGD today No other acute GI complaints.  Review of Systems  Constitutional:  Negative for activity change, appetite change, chills, diaphoresis, fatigue, fever and unexpected weight change.  HENT:  Negative for trouble swallowing and voice change.   Respiratory:  Negative for shortness of breath and wheezing.   Cardiovascular:  Negative for chest pain, palpitations and leg swelling.  Gastrointestinal:  Positive for blood in stool (melena), nausea and vomiting. Negative for abdominal distention, abdominal pain, anal bleeding, constipation and diarrhea.  Musculoskeletal:  Negative for arthralgias and myalgias.  Skin:  Positive for color change (jaundice). Negative for pallor.  Neurological:  Negative for dizziness, syncope and weakness.  Psychiatric/Behavioral:  Negative for confusion. The patient is not nervous/anxious.   All other systems reviewed and are negative.   Medications  Current Facility-Administered Medications:    0.9 %  sodium chloride infusion (Manually program via Guardrails IV Fluids), , Intravenous, Once, Annamaria Helling, DO   albumin human 25 % solution 25 g, 25 g, Intravenous, Q6H, Rise Patience, MD, Stopped at 02/24/21 0506   cefTRIAXone (ROCEPHIN) 2 g in sodium chloride 0.9 % 100 mL IVPB, 2 g, Intravenous, Q24H, Rise Patience, MD, Stopped at 02/24/21 0505   [COMPLETED] octreotide (SANDOSTATIN) 2 mcg/mL load via infusion 50 mcg, 50 mcg, Intravenous, Once, 50 mcg at 02/23/21 1758 **AND** octreotide (SANDOSTATIN) 500 mcg in sodium chloride 0.9 % 250 mL (2 mcg/mL) infusion, 50 mcg/hr,  Intravenous, Continuous, Lucrezia Starch, MD, Last Rate: 25 mL/hr at 02/24/21 0508, 50 mcg/hr at 02/24/21 0508   [START ON 02/27/2021] pantoprazole (PROTONIX) injection 40 mg, 40 mg, Intravenous, Q12H, Lucrezia Starch, MD   pantoprozole (PROTONIX) 80 mg /NS 100 mL infusion, 8 mg/hr, Intravenous, Continuous, Lucrezia Starch, MD, Last Rate: 10 mL/hr at 02/24/21 0329, 8 mg/hr at 02/24/21 0329  Current Outpatient Medications:    aspirin EC 81 MG tablet, Take 81 mg by mouth daily. Swallow whole., Disp: , Rfl:    omeprazole (PRILOSEC) 40 MG capsule, Take 40 mg by mouth daily., Disp: , Rfl:    folic acid (FOLVITE) 1 MG tablet, Take 2 tablets (2 mg total) by mouth daily. (Patient not taking: Reported on 02/23/2021), Disp: 60 tablet, Rfl: 4   lidocaine-prilocaine (EMLA) cream, Apply to affected area once (Patient not taking: Reported on 02/23/2021), Disp: 30 g, Rfl: 3  albumin human Stopped (02/24/21 0506)   cefTRIAXone (ROCEPHIN)  IV Stopped (02/24/21 0505)   octreotide  (SANDOSTATIN)    IV infusion 50 mcg/hr (02/24/21 0508)   pantoprazole 8 mg/hr (02/24/21 0329)       Objective    Vitals:   02/24/21 0500 02/24/21 0530 02/24/21 0600 02/24/21 0630  BP: (!) 87/48 (!) 97/57 (!) 100/58 (!) 98/57  Pulse: 77 73 74 75  Resp: (!) 23 (!) 24 15 20   Temp:      TempSrc:      SpO2: 100% 100% 99% 99%  Weight:      Height:         Physical Exam Vitals and nursing note reviewed.  Constitutional:      General: He is not in acute distress.  Appearance: He is ill-appearing (chronically). He is not toxic-appearing or diaphoretic.     Comments: Frail appearing; thin   HENT:     Head: Normocephalic and atraumatic.     Nose: Nose normal.     Mouth/Throat:     Mouth: Mucous membranes are moist.     Pharynx: Oropharynx is clear.     Comments: Edentulous. Sublingual telangiectasias Eyes:     General: Scleral icterus present.     Extraocular Movements: Extraocular movements intact.  Cardiovascular:      Rate and Rhythm: Normal rate and regular rhythm.     Heart sounds: Murmur heard.    No friction rub. No gallop.  Pulmonary:     Effort: Pulmonary effort is normal. No respiratory distress.     Breath sounds: Normal breath sounds. No wheezing, rhonchi or rales.     Comments: Diminished bilaterally Abdominal:     General: Abdomen is flat. Bowel sounds are normal. There is no distension.     Palpations: Abdomen is soft.     Tenderness: There is no abdominal tenderness. There is no guarding or rebound.  Musculoskeletal:     Cervical back: Neck supple.     Right lower leg: No edema.     Left lower leg: No edema.  Skin:    General: Skin is warm and dry.     Coloration: Skin is jaundiced and pale.  Neurological:     General: No focal deficit present.     Mental Status: He is alert and oriented to person, place, and time. Mental status is at baseline.  Psychiatric:        Mood and Affect: Mood normal.        Behavior: Behavior normal.        Thought Content: Thought content normal.        Judgment: Judgment normal.    Laboratory Data Recent Labs  Lab 02/23/21 1339 02/24/21 0326 02/24/21 0509  WBC 15.6* 17.5* 15.8*  HGB 4.7* 7.1* 6.7*  HCT 15.9* 21.6* 20.4*  PLT 261 118* 113*  NEUTOPHILPCT 75  --   --   LYMPHOPCT 13  --   --   MONOPCT 10  --   --   EOSPCT 0  --   --    Recent Labs  Lab 02/23/21 1339 02/24/21 0509  NA 135 131*  K 4.3 3.9  CL 104 103  CO2 18* 23  BUN 37* 37*  CREATININE 1.11 1.02  CALCIUM 8.0* 7.9*  PROT 6.0* 5.4*  BILITOT 0.7 2.4*  ALKPHOS 189* 127*  ALT 38 29  AST 55* 33  GLUCOSE 133* 117*   Recent Labs  Lab 02/23/21 1339 02/24/21 0509  INR 1.4* 1.4*      Imaging Studies: CT Angio Abd/Pel W and/or Wo Contrast  Result Date: 02/23/2021 CLINICAL DATA:  Lower GI bleeding. History of hepatocellular carcinoma. EXAM: CTA ABDOMEN AND PELVIS WITHOUT AND WITH CONTRAST TECHNIQUE: Multidetector CT imaging of the abdomen and pelvis was  performed using the standard protocol during bolus administration of intravenous contrast. Multiplanar reconstructed images and MIPs were obtained and reviewed to evaluate the vascular anatomy. CONTRAST:  128m OMNIPAQUE IOHEXOL 350 MG/ML SOLN COMPARISON:  CT the chest, abdomen pelvis-01/28/2021; 10/09/2020 FINDINGS: VASCULAR Aorta: There is a large amount of irregular calcified and noncalcified atherosclerotic plaque throughout the abdominal aorta approaching though not definitively resulting in a hemodynamically significant stenosis. No evidence of abdominal aortic dissection or perivascular stranding. Celiac: Mixed calcified and noncalcified atherosclerotic plaque  involves the origin the celiac artery, not resulting in hemodynamically significant stenosis. Conventional branching pattern. SMA: Mixed calcified and noncalcified atherosclerotic plaque involves the origin, proximal aspect and main trunk of the SMA resulting in tandem areas of at least 50% luminal narrowing (representative axial images 74 and 78, series 5). Conventional branching pattern. The distal tributaries of the SMA appear widely patent without discrete lumen filling defect to suggest distal embolism. Renals: Solitary bilaterally; there is a moderate to large amount of eccentric irregular mixed calcified and noncalcified atherosclerotic plaque involving the origin of the right renal artery resulting in suspected severe narrowing (coronal image 72, series 9). There is a large amount of mixed calcified and noncalcified atherosclerotic plaque throughout the left renal artery resulting in tandem areas of at least 50% luminal narrowing (representative coronal image 78, series 9). These findings are without associated asymmetric renal atrophy or delayed renal enhancement. No vessel irregularity to suggest FMD. IMA: Diseased at its origin though remains patent. Inflow: Post stenting of the left common iliac artery with suspected hemodynamically  significant narrowing at its distal aspect (coronal image 79, series 9). Eccentric calcified and noncalcified atherosclerotic plaque results in tandem areas of approximately 50% luminal narrowing involving the left external iliac artery. Moderate amount of mixed calcified and noncalcified atherosclerotic plaque results in tandem areas of at least 50% luminal narrowing throughout the right common and external iliac arteries. Proximal Outflow: Suspected hemodynamically significant narrowings involving the bilateral common and imaged portions of the bilateral superficial femoral arteries. Veins: The IVC and pelvic venous systems appear widely patent. Review of the MIP images confirms the above findings. _________________________________________________________ NON-VASCULAR Lower chest: Limited visualization of the lower thorax demonstrates residual ill-defined ground-glass opacities within the bilateral lung bases, improved compared to the 01/28/2021 examination. Minimal nodular atelectasis within the left costophrenic angle. No discrete focal airspace opacities. No pleural effusion. Normal heart size. Diffuse decreased attenuation of the intra cardiac blood pool suggestive of anemia. Hepatobiliary: Nodularity of the hepatic contour compatible with known history of cirrhosis. Ill-defined hypoattenuating mass within the left lobe of the liver is grossly unchanged compared to recent examination performed 01/28/2021 measuring at least 7.8 x 5.3 by 6.0 cm (axial image 17, series 6; coronal image 36, series 9, as is a satellite lesion within the dome of the right lobe of the liver measuring 2.4 cm (image 10, series 6). No new discrete hepatic lesions. Normal appearance of the gallbladder given degree distention. No radiopaque gallstones. No intra or extrahepatic biliary duct dilatation. 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 No ascites. Pancreas: Normal caliber of the abdominal aorta. Spleen:  Redemonstrated splenomegaly with the spleen measuring 15 cm in length (image 25, series 6). Adrenals/Urinary Tract: There is symmetric enhancement of the bilateral kidneys. No evidence of nephrolithiasis. No discrete renal lesions. No urine obstruction or perinephric stranding. Normal appearance of the bilateral adrenal glands. Normal appearance of the urinary bladder given degree of distention. Stomach/Bowel: Large colonic stool burden, particularly within the rectal vault. There is apparent ill-defined stranding seen adjacent to the hepatic flexure of the colon within the right upper abdominal quadrant (coronal image 55, series 14; axial image 43, series 6) otherwise, no discrete areas of bowel wall thickening. Normal appearance of the terminal ileum. The appendix is not visualized, however there is no pericecal inflammatory change. Small hiatal hernia with suspected small distal esophageal varices. No discrete areas of intraluminal contrast extravasation to suggest reported history of GI bleeding. Lymphatic: Redemonstrated mildly prominent though non  pathologically enlarged porta hepatis and gastrohepatic ligament lymph nodes, recent unchanged compared to recent staging CT scan with index gastrohepatic lymph node measuring 0.8 cm in greatest short axis diameter (image 25, series 6). No bulky retroperitoneal, mesenteric, pelvic or inguinal lymphadenopathy. Reproductive: Prostatomegaly with mass effect on the undersurface of the urinary bladder. There is a small amount of free fluid in the pelvic cul-de-sac. Other: Regional soft tissues appear normal. Musculoskeletal: No acute or aggressive osseous abnormalities. 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 Mild degenerative change of the bilateral hips with joint space loss, subchondral sclerosis and osteophytosis. IMPRESSION: NON-VASCULAR 1. No discrete areas of intraluminal contrast extravasation to suggest the etiology of reported history  of GI bleeding. 2. Suspected minimal amount of stranding adjacent to the hepatic flexure of the colon, the etiology of which is not depicted on this examination. Specifically, no evidence of intraluminal contrast extravasation, enteric obstruction or perforation/definable/drainable fluid collection. 3. 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 4. Suspected distal esophageal varices, again without intraluminal contrast extravasation. Further evaluation with endoscopy could be performed as indicated. VASCULAR 1. Large amount of atherosclerotic plaque throughout the normal caliber abdominal aorta approaching, though not definitively resulting in a hemodynamically significant stenosis. No evidence of abdominal aortic dissection or perivascular stranding. Aortic Atherosclerosis (ICD10-I70.0). 2. Suspected hemodynamically significant narrowing involving the origin and proximal aspects of the SMA however both the celiac and IMA remain patent. 3. Suspected hemodynamically significant narrowings involving the bilateral renal arteries though without associated delayed renal atrophy or asymmetric renal enhancement. 4. Post stenting of the left common iliac artery with suspected hemodynamically significant narrowing at the distal landing zone of the stent. Suspected hemodynamically significant narrowings involving the left external iliac artery, the left common and imaged portions of the left superficial femoral arteries. Correlation for symptoms of left lower extremity PAD is advised. 5. Suspected hemodynamically significant narrowings involving the right common and external iliac arteries, the right common femoral and imaged portions of the right superficial femoral arteries. Correlation for symptoms of right lower extremity PAD is advised.  Electronically Signed   By: Sandi Mariscal M.D.   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 To receive 1 more  Monitor H&H.  Transfusion and resuscitation as per primary team.  Avoid too  frequent lab draws to prevent further lab induced anemia LFT, CBC, INR daily Goal hemoglobin between 7 and 9. Continue protonix iv gtt and octreotide gtt for 72 hours total; Can transition to twice daily protonix po 40 mg after that Ceftriaxone 1 g for total of 7 days abx (d2/7) Maintain at least two sites of iv access Supportive care as per primary team  Esophagogastroduodenoscopy with possible biopsy, control of bleeding, polypectomy, and interventions as necessary has been discussed with the patient/patient representative. Informed consent was obtained from the patient/patient representative after explaining the indication, nature, and risks of the procedure including but not limited to death, bleeding, perforation, missed neoplasm/lesions,  cardiorespiratory compromise, and reaction to medications. Opportunity for questions was given and appropriate answers were provided. Patient/patient representative has verbalized understanding is amenable to undergoing the procedure.  Further recommendations pending endoscopy. Please see op report for further details  I personally performed the service.  Management of other medical comorbidities as per primary team  Thank you for allowing [place] to participate in this patient's care. Please don't hesitate to call if any questions or concerns arise.   Annamaria Helling, DO [facility]  Portions of the record may have been created with voice recognition software. Occasional wrong-word or 'sound-a-like' substitutions may have occurred due to the inherent limitations of voice recognition software.  Read the chart carefully and recognize, using context, where substitutions may have occurred.

## 2021-02-24 NOTE — Anesthesia Preprocedure Evaluation (Signed)
Anesthesia Evaluation  Patient identified by MRN, date of birth, ID band Patient awake    Reviewed: Allergy & Precautions, H&P , NPO status , Patient's Chart, lab work & pertinent test results, reviewed documented beta blocker date and time   Airway Mallampati: III   Neck ROM: full    Dental  (+) Poor Dentition   Pulmonary COPD, Current Smoker and Patient abstained from smoking.,    Pulmonary exam normal        Cardiovascular Exercise Tolerance: Poor hypertension, On Medications + CAD  Normal cardiovascular exam Rhythm:regular Rate:Normal     Neuro/Psych negative neurological ROS  negative psych ROS   GI/Hepatic negative GI ROS, Neg liver ROS,   Endo/Other  negative endocrine ROS  Renal/GU negative Renal ROS  negative genitourinary   Musculoskeletal   Abdominal   Peds  Hematology  (+) Blood dyscrasia, anemia ,   Anesthesia Other Findings Past Medical History: No date: Barrett esophagus No date: CAD (coronary artery disease) No date: Cancer ([place])     Comment:  liver cancer No date: Cirrhosis ([place]) No date: Heart disease No date: Hereditary hemochromatosis ([place]) Past Surgical History: No date: CORONARY ARTERY BYPASS GRAFT     Comment:  at 65 years old 05/21/2020: ESOPHAGOGASTRODUODENOSCOPY (EGD) WITH PROPOFOL; N/A     Comment:  Procedure: ESOPHAGOGASTRODUODENOSCOPY (EGD) WITH               PROPOFOL;  Surgeon: Lin Landsman, MD;  Location:               [facility] ENDOSCOPY;  Service: Gastroenterology;  Laterality:               N/A; 06/12/2020: LAPAROSCOPIC LYSIS OF ADHESIONS     Comment:  Procedure: LAPAROSCOPIC LYSIS OF ADHESIONS;  Surgeon:               Jules Husbands, MD;  Location: [facility] ORS;  Service:               General;; 04/30/2020: PORTA CATH INSERTION; N/A     Comment:  Procedure: PORTA CATH INSERTION;  Surgeon: Algernon Huxley,              MD;  Location: [facility];  Service:                Cardiovascular;  Laterality: N/A; No date: VEIN BYPASS SURGERY BMI    Body Mass Index: 21.41 kg/m     Reproductive/Obstetrics negative OB ROS                             Anesthesia Physical Anesthesia Plan  ASA: 4 and emergent  Anesthesia Plan: General   Post-op Pain Management:    Induction:   PONV Risk Score and Plan:   Airway Management Planned:   Additional Equipment:   Intra-op Plan:   Post-operative Plan:   Informed Consent: I have reviewed the patients History and Physical, chart, labs and discussed the procedure including the risks, benefits and alternatives for the proposed anesthesia with the patient or authorized representative who has indicated his/her understanding and acceptance.     Dental Advisory Given  Plan Discussed with: CRNA  Anesthesia Plan Comments:         Anesthesia Quick Evaluation

## 2021-02-24 NOTE — Progress Notes (Signed)
PROGRESS NOTE    Maxwell Edwards  XLK:[id number] DOB: 04/03/1955 DOA: 02/23/2021 PCP: Venita Lick, NP   Assessment & Plan:   Principal Problem:   Acute GI bleeding Active Problems:   CAD (coronary artery disease)   Nicotine dependence, cigarettes, uncomplicated   Centrilobular emphysema (HCC)   Cancer, hepatocellular (HCC)   Hereditary hemochromatosis ([place])   Iron overload   Acute blood loss anemia   DNR (do not resuscitate)/DNI(Do Not Intubate)   Acute GI bleeding: NPO. S/p EGD which shows Barrett's disease, grade esophagitis, grade II esophageal varices which were banded. S/p 3 units of pRBCs transfused and 1 unit more ordered. Repeat H&H 4 hours post 4 unit of pRBCs. Continue on octreotide & protonix drips as per GI. Repeat EGD in 2 weeks as per GI    Hepatocellular carcinoma: not undergoing any treatment at this time   Smoker: received smoking cessation counseling but does not want to quit    Centrilobular emphysema: continue w/ bronchodilators and encourage incentive spirometry   Hereditary hemochromatosis: been getting weekly phlebotomies due to his iron overload.   Acute blood loss anemia: secondary to GI bleed.  S/p 3 units of pRBCs given and a 4th unit ordered    CAD: hold home dose of aspirin   Thrombocytopenia: etiology unclear. Will continue to monitor   Leukocytosis: likely reactive. Will continue to monitor    DVT prophylaxis: SCDs Code Status: full  Family Communication: Disposition Plan: depends on PT/OT recs (not consulted yet)   Level of care: Progressive  Status is: Inpatient  Remains inpatient appropriate because: severity of illness     Consultants:  GI   Procedures:   Antimicrobials:   Subjective: Pt c/o malaise   Objective: Vitals:   02/24/21 0530 02/24/21 0600 02/24/21 0630 02/24/21 0730  BP: (!) 97/57 (!) 100/58 (!) 98/57 (!) 98/52  Pulse: 73 74 75 72  Resp: (!) 24 15 20  (!) 25  Temp:      TempSrc:      SpO2:  100% 99% 99% 100%  Weight:      Height:        Intake/Output Summary (Last 24 hours) at 02/24/2021 0811 Last data filed at 02/23/2021 2328 Gross per 24 hour  Intake 1770 ml  Output 250 ml  Net 1520 ml   Filed Weights   02/23/21 1343  Weight: 65.8 kg    Examination:  General exam: Appears calm and comfortable  Respiratory system: Clear to auscultation. Respiratory effort normal. Cardiovascular system: S1 & S2 +. No rubs, gallops or clicks. No pedal edema. Gastrointestinal system: Abdomen is nondistended, soft and nontender. Normal bowel sounds heard. Central nervous system: Alert and oriented. Moves all extremities  Psychiatry: Judgement and insight appear normal. Flat mood and affect     Data Reviewed: I have personally reviewed following labs and imaging studies  CBC: Recent Labs  Lab 02/20/21 0842 02/23/21 1339 02/24/21 0326 02/24/21 0509  WBC  --  15.6* 17.5* 15.8*  NEUTROABS  --  11.9*  --   --   HGB 8.1* 4.7* 7.1* 6.7*  HCT 25.8* 15.9* 21.6* 20.4*  MCV  --  94.6 90.8 91.1  PLT  --  261 118* 366*   Basic Metabolic Panel: Recent Labs  Lab 02/23/21 1339 02/24/21 0509  NA 135 131*  K 4.3 3.9  CL 104 103  CO2 18* 23  GLUCOSE 133* 117*  BUN 37* 37*  CREATININE 1.11 1.02  CALCIUM 8.0* 7.9*   GFR: Estimated  Creatinine Clearance: 67.2 mL/min (by C-G formula based on SCr of 1.02 mg/dL). Liver Function Tests: Recent Labs  Lab 02/23/21 1339 02/24/21 0509  AST 55* 33  ALT 38 29  ALKPHOS 189* 127*  BILITOT 0.7 2.4*  PROT 6.0* 5.4*  ALBUMIN 2.5* 2.7*   No results for input(s): LIPASE, AMYLASE in the last 168 hours. Recent Labs  Lab 02/23/21 1339  AMMONIA 12   Coagulation Profile: Recent Labs  Lab 02/23/21 1339 02/24/21 0509  INR 1.4* 1.4*   Cardiac Enzymes: No results for input(s): CKTOTAL, CKMB, CKMBINDEX, TROPONINI in the last 168 hours. BNP (last 3 results) No results for input(s): PROBNP in the last 8760 hours. HbA1C: No results for  input(s): HGBA1C in the last 72 hours. CBG: No results for input(s): GLUCAP in the last 168 hours. Lipid Profile: No results for input(s): CHOL, HDL, LDLCALC, TRIG, CHOLHDL, LDLDIRECT in the last 72 hours. Thyroid Function Tests: No results for input(s): TSH, T4TOTAL, FREET4, T3FREE, THYROIDAB in the last 72 hours. Anemia Panel: No results for input(s): VITAMINB12, FOLATE, FERRITIN, TIBC, IRON, RETICCTPCT in the last 72 hours. Sepsis Labs: Recent Labs  Lab 02/23/21 1557 02/23/21 2024  LATICACIDVEN 6.9* 3.7*    Recent Results (from the past 240 hour(s))  Resp Panel by RT-PCR (Flu A&B, Covid) Nasopharyngeal Swab     Status: None   Collection Time: 02/23/21  1:39 PM   Specimen: Nasopharyngeal Swab; Nasopharyngeal(NP) swabs in vial transport medium  Result Value Ref Range Status   SARS Coronavirus 2 by RT PCR NEGATIVE NEGATIVE Final    Comment: (NOTE) SARS-CoV-2 target nucleic acids are NOT DETECTED.  The SARS-CoV-2 RNA is generally detectable in upper respiratory specimens during the acute phase of infection. The lowest concentration of SARS-CoV-2 viral copies this assay can detect is 138 copies/mL. A negative result does not preclude SARS-Cov-2 infection and should not be used as the sole basis for treatment or other patient management decisions. A negative result may occur with  improper specimen collection/handling, submission of specimen other than nasopharyngeal swab, presence of viral mutation(s) within the areas targeted by this assay, and inadequate number of viral copies(<138 copies/mL). A negative result must be combined with clinical observations, patient history, and epidemiological information. The expected result is Negative.  Fact Sheet for Patients:  EntrepreneurPulse.com.au  Fact Sheet for Healthcare Providers:  IncredibleEmployment.be  This test is no t yet approved or cleared by the [place] FDA and  has been  authorized for detection and/or diagnosis of SARS-CoV-2 by FDA under an Emergency Use Authorization (EUA). This EUA will remain  in effect (meaning this test can be used) for the duration of the COVID-19 declaration under Section 564(b)(1) of the Act, 21 U.S.C.section 360bbb-3(b)(1), unless the authorization is terminated  or revoked sooner.       Influenza A by PCR NEGATIVE NEGATIVE Final   Influenza B by PCR NEGATIVE NEGATIVE Final    Comment: (NOTE) The Xpert Xpress SARS-CoV-2/FLU/RSV plus assay is intended as an aid in the diagnosis of influenza from Nasopharyngeal swab specimens and should not be used as a sole basis for treatment. Nasal washings and aspirates are unacceptable for Xpert Xpress SARS-CoV-2/FLU/RSV testing.  Fact Sheet for Patients: EntrepreneurPulse.com.au  Fact Sheet for Healthcare Providers: IncredibleEmployment.be  This test is not yet approved or cleared by the [place] FDA and has been authorized for detection and/or diagnosis of SARS-CoV-2 by FDA under an Emergency Use Authorization (EUA). This EUA will remain in effect (meaning this test can  be used) for the duration of the COVID-19 declaration under Section 564(b)(1) of the Act, 21 U.S.C. section 360bbb-3(b)(1), unless the authorization is terminated or revoked.  Performed at [facility], [address] [place], [place] [address]   Blood culture (routine x 2)     Status: None (Preliminary result)   Collection Time: 02/23/21  3:57 PM   Specimen: BLOOD  Result Value Ref Range Status   Specimen Description BLOOD LEFT [facility]  Final   Special Requests   Final    BOTTLES DRAWN AEROBIC AND ANAEROBIC Blood Culture adequate volume   Culture   Final    NO GROWTH < 24 HOURS Performed at [facility], [address] [place], [place] [address]    Report Status PENDING  Incomplete         Radiology Studies: CT Angio Abd/Pel W and/or Wo  Contrast  Result Date: 02/23/2021 CLINICAL DATA:  Lower GI bleeding. History of hepatocellular carcinoma. EXAM: CTA ABDOMEN AND PELVIS WITHOUT AND WITH CONTRAST TECHNIQUE: Multidetector CT imaging of the abdomen and pelvis was performed using the standard protocol during bolus administration of intravenous contrast. Multiplanar reconstructed images and MIPs were obtained and reviewed to evaluate the vascular anatomy. CONTRAST:  129mL OMNIPAQUE IOHEXOL 350 MG/ML SOLN COMPARISON:  CT the chest, abdomen pelvis-01/28/2021; 10/09/2020 FINDINGS: VASCULAR Aorta: There is a large amount of irregular calcified and noncalcified atherosclerotic plaque throughout the abdominal aorta approaching though not definitively resulting in a hemodynamically significant stenosis. No evidence of abdominal aortic dissection or perivascular stranding. Celiac: Mixed calcified and noncalcified atherosclerotic plaque involves the origin the celiac artery, not resulting in hemodynamically significant stenosis. Conventional branching pattern. SMA: Mixed calcified and noncalcified atherosclerotic plaque involves the origin, proximal aspect and main trunk of the SMA resulting in tandem areas of at least 50% luminal narrowing (representative axial images 74 and 78, series 5). Conventional branching pattern. The distal tributaries of the SMA appear widely patent without discrete lumen filling defect to suggest distal embolism. Renals: Solitary bilaterally; there is a moderate to large amount of eccentric irregular mixed calcified and noncalcified atherosclerotic plaque involving the origin of the right renal artery resulting in suspected severe narrowing (coronal image 72, series 9). There is a large amount of mixed calcified and noncalcified atherosclerotic plaque throughout the left renal artery resulting in tandem areas of at least 50% luminal narrowing (representative coronal image 78, series 9). These findings are without associated  asymmetric renal atrophy or delayed renal enhancement. No vessel irregularity to suggest FMD. IMA: Diseased at its origin though remains patent. Inflow: Post stenting of the left common iliac artery with suspected hemodynamically significant narrowing at its distal aspect (coronal image 79, series 9). Eccentric calcified and noncalcified atherosclerotic plaque results in tandem areas of approximately 50% luminal narrowing involving the left external iliac artery. Moderate amount of mixed calcified and noncalcified atherosclerotic plaque results in tandem areas of at least 50% luminal narrowing throughout the right common and external iliac arteries. Proximal Outflow: Suspected hemodynamically significant narrowings involving the bilateral common and imaged portions of the bilateral superficial femoral arteries. Veins: The IVC and pelvic venous systems appear widely patent. Review of the MIP images confirms the above findings. _________________________________________________________ NON-VASCULAR Lower chest: Limited visualization of the lower thorax demonstrates residual ill-defined ground-glass opacities within the bilateral lung bases, improved compared to the 01/28/2021 examination. Minimal nodular atelectasis within the left costophrenic angle. No discrete focal airspace opacities. No pleural effusion. Normal heart size. Diffuse decreased attenuation of the intra  cardiac blood pool suggestive of anemia. Hepatobiliary: Nodularity of the hepatic contour compatible with known history of cirrhosis. Ill-defined hypoattenuating mass within the left lobe of the liver is grossly unchanged compared to recent examination performed 01/28/2021 measuring at least 7.8 x 5.3 by 6.0 cm (axial image 17, series 6; coronal image 36, series 9, as is a satellite lesion within the dome of the right lobe of the liver measuring 2.4 cm (image 10, series 6). No new discrete hepatic lesions. Normal appearance of the gallbladder given  degree distention. No radiopaque gallstones. No intra or extrahepatic biliary duct dilatation. 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 No ascites. Pancreas: Normal caliber of the abdominal aorta. Spleen: Redemonstrated splenomegaly with the spleen measuring 15 cm in length (image 25, series 6). Adrenals/Urinary Tract: There is symmetric enhancement of the bilateral kidneys. No evidence of nephrolithiasis. No discrete renal lesions. No urine obstruction or perinephric stranding. Normal appearance of the bilateral adrenal glands. Normal appearance of the urinary bladder given degree of distention. Stomach/Bowel: Large colonic stool burden, particularly within the rectal vault. There is apparent ill-defined stranding seen adjacent to the hepatic flexure of the colon within the right upper abdominal quadrant (coronal image 55, series 14; axial image 43, series 6) otherwise, no discrete areas of bowel wall thickening. Normal appearance of the terminal ileum. The appendix is not visualized, however there is no pericecal inflammatory change. Small hiatal hernia with suspected small distal esophageal varices. No discrete areas of intraluminal contrast extravasation to suggest reported history of GI bleeding. Lymphatic: Redemonstrated mildly prominent though non pathologically enlarged porta hepatis and gastrohepatic ligament lymph nodes, recent unchanged compared to recent staging CT scan with index gastrohepatic lymph node measuring 0.8 cm in greatest short axis diameter (image 25, series 6). No bulky retroperitoneal, mesenteric, pelvic or inguinal lymphadenopathy. Reproductive: Prostatomegaly with mass effect on the undersurface of the urinary bladder. There is a small amount of free fluid in the pelvic cul-de-sac. Other: Regional soft tissues appear normal. Musculoskeletal: No acute or aggressive osseous abnormalities. 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 Mild  degenerative change of the bilateral hips with joint space loss, subchondral sclerosis and osteophytosis. IMPRESSION: NON-VASCULAR 1. No discrete areas of intraluminal contrast extravasation to suggest the etiology of reported history of GI bleeding. 2. Suspected minimal amount of stranding adjacent to the hepatic flexure of the colon, the etiology of which is not depicted on this examination. Specifically, no evidence of intraluminal contrast extravasation, enteric obstruction or perforation/definable/drainable fluid collection. 3. 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 4. Suspected distal esophageal varices, again without intraluminal contrast extravasation. Further evaluation with endoscopy could be performed as indicated. VASCULAR 1. Large amount of atherosclerotic plaque throughout the normal caliber abdominal aorta approaching, though not definitively resulting in a hemodynamically significant stenosis. No evidence of abdominal aortic dissection or perivascular stranding. Aortic Atherosclerosis (ICD10-I70.0). 2. Suspected hemodynamically significant narrowing involving the origin and proximal aspects of the SMA however both the celiac and IMA remain patent. 3. Suspected hemodynamically significant narrowings involving the bilateral renal arteries though without associated delayed renal atrophy or asymmetric renal enhancement. 4. Post stenting of the left common iliac artery with suspected hemodynamically significant narrowing at the distal landing zone of the stent. Suspected hemodynamically significant narrowings involving the left external iliac artery, the left common and imaged  portions of the left superficial femoral arteries. Correlation for symptoms of left lower extremity PAD is advised. 5. Suspected hemodynamically  significant narrowings involving the right common and external iliac arteries, the right common femoral and imaged portions of the right superficial femoral arteries. Correlation for symptoms of right lower extremity PAD is advised. Electronically Signed   By: Sandi Mariscal M.D.   On: 02/23/2021 15:26        Scheduled Meds:  sodium chloride   Intravenous Once   [START ON 02/27/2021] pantoprazole  40 mg Intravenous Q12H   Continuous Infusions:  albumin human Stopped (02/24/21 0506)   cefTRIAXone (ROCEPHIN)  IV Stopped (02/24/21 0505)   octreotide  (SANDOSTATIN)    IV infusion 50 mcg/hr (02/24/21 0508)   pantoprazole 8 mg/hr (02/24/21 0329)     LOS: 1 day    Time spent: 30 mins     Wyvonnia Dusky, MD [facility] Pager [phone]  If 7PM-7AM, please contact night-coverage 02/24/2021, 8:11 AM

## 2021-02-24 NOTE — Consult Note (Signed)
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  On admission his hemoglobin was 4.7.  Previously while in our clinic hemoglobin was 8.1 and 11.7 a few days prior.  He was given 4 units PRBC, started on Protonix and octreotide drip as well as ceftriaxone.  Initial work-up included CT scan which showed known liver lesion but no evidence of extravasation.  Patient had an EGD this morning which showed Barrett's esophagus, grade 2 esophageal varices that were banded and esophagitis with no bleeding.- Grade II esophageal varices. Banded.  He receives frequent phlebotomies with [place] here at the cancer center for hemochromatosis.  His last phlebotomy was on 02/20/2021.  Ferritin on 02/08/2021 was 313.   Attempted to see patient today but he was still in PACU.  Patient is followed by Dr. Janese Banks but will see covering provider while in the hospital.  REVIEW OF SYSTEMS:   Review of Systems  Unable to perform ROS: Other   As per HPI. Otherwise, a complete review of systems is negative.  PAST MEDICAL HISTORY: Past Medical History:  Diagnosis Date   Barrett esophagus    CAD (coronary artery disease)    Cancer ([place])    liver cancer   Cirrhosis ([place])    Heart disease    Hereditary hemochromatosis ([place])     PAST SURGICAL HISTORY: Past Surgical History:  Procedure  Laterality Date   CORONARY ARTERY BYPASS GRAFT     at 65 years old   ESOPHAGOGASTRODUODENOSCOPY (EGD) WITH PROPOFOL N/A 05/21/2020   Procedure: ESOPHAGOGASTRODUODENOSCOPY (EGD) WITH PROPOFOL;  Surgeon: Lin Landsman, MD;  Location: [facility];  Service: Gastroenterology;  Laterality: N/A;   LAPAROSCOPIC LYSIS OF ADHESIONS  06/12/2020   Procedure: LAPAROSCOPIC LYSIS OF ADHESIONS;  Surgeon: Jules Husbands, MD;  Location: [facility];  Service: General;;   PORTA CATH INSERTION N/A 04/30/2020   Procedure: PORTA CATH INSERTION;  Surgeon: Algernon Huxley, MD;  Location: [facility];  Service: Cardiovascular;  Laterality: N/A;   VEIN BYPASS SURGERY      FAMILY HISTORY: Family History  Problem Relation Age of Onset   Dementia Mother    Ulcers Father    Heart attack Brother    Throat cancer Brother    Heart attack Daughter    Diabetes Daughter    Depression Daughter     ADVANCED DIRECTIVES (Y/N):  @ADVDIR @  HEALTH MAINTENANCE: Social History   Tobacco Use   Smoking status: Some Days    Packs/day: 0.50    Types: Cigarettes   Smokeless tobacco: Never  Vaping Use   Vaping Use: Never used  Substance Use Topics   Alcohol use: Not Currently   Drug use: Not Currently     Colonoscopy:  PAP:  Bone density:  Lipid panel:  No Known Allergies  Current Facility-Administered Medications  Medication Dose Route Frequency Provider Last Rate Last Admin   0.9 %  sodium chloride infusion (Manually program via Guardrails IV Fluids)   Intravenous Once  Annamaria Helling, DO       acetaminophen (TYLENOL) suppository 650 mg  650 mg Rectal Q4H PRN Kristopher Oppenheim, DO       cefTRIAXone (ROCEPHIN) 2 g in sodium chloride 0.9 % 100 mL IVPB  2 g Intravenous Q24H Rise Patience, MD   Stopped at 02/24/21 0505   dextrose 5 % in lactated ringers infusion   Intravenous Continuous Kristopher Oppenheim, DO 50 mL/hr at 02/24/21 1135 New Bag at 02/24/21 1135   octreotide (SANDOSTATIN) 500 mcg in sodium  chloride 0.9 % 250 mL (2 mcg/mL) infusion  50 mcg/hr Intravenous Continuous Kristopher Oppenheim, DO 25 mL/hr at 02/24/21 0508 50 mcg/hr at 02/24/21 0508   ondansetron (ZOFRAN) injection 4 mg  4 mg Intravenous Q6H PRN Kristopher Oppenheim, DO       pantoprozole (PROTONIX) 80 mg /NS 100 mL infusion  8 mg/hr Intravenous Continuous Kristopher Oppenheim, DO 10 mL/hr at 02/24/21 0848 8 mg/hr at 02/24/21 0848    OBJECTIVE: Vitals:   02/24/21 1600 02/24/21 1626  BP: 112/60 111/60  Pulse: 71 72  Resp: 20 18  Temp: 98.4 F (36.9 C) 97.9 F (36.6 C)  SpO2: 99% 99%     Body mass index is 21.42 kg/m.    ECOG FS:1 - Symptomatic but completely ambulatory  Physical Exam   LAB RESULTS:  Lab Results  Component Value Date   NA 131 (L) 02/24/2021   K 3.9 02/24/2021   CL 103 02/24/2021   CO2 23 02/24/2021   GLUCOSE 117 (H) 02/24/2021   BUN 37 (H) 02/24/2021   CREATININE 1.02 02/24/2021   CALCIUM 7.9 (L) 02/24/2021   PROT 5.4 (L) 02/24/2021   ALBUMIN 2.7 (L) 02/24/2021   AST 33 02/24/2021   ALT 29 02/24/2021   ALKPHOS 127 (H) 02/24/2021   BILITOT 2.4 (H) 02/24/2021   GFRNONAA >60 02/24/2021   GFRAA 104 04/06/2020    Lab Results  Component Value Date   WBC 15.8 (H) 02/24/2021   NEUTROABS 11.9 (H) 02/23/2021   HGB 8.0 (L) 02/24/2021   HCT 24.2 (L) 02/24/2021   MCV 91.1 02/24/2021   PLT 113 (L) 02/24/2021     STUDIES: MR BRAIN W WO CONTRAST  Result Date: 01/28/2021 CLINICAL DATA:  Brain/CNS neoplasm, assess treatment response; hepatocellular carcinoma with Parkinson's EXAM: MRI HEAD WITHOUT AND WITH CONTRAST TECHNIQUE: Multiplanar, multiecho pulse sequences of the brain and surrounding structures were obtained without and with intravenous contrast. CONTRAST:  7.105mL GADAVIST GADOBUTROL 1 MMOL/ML IV SOLN COMPARISON:  None. FINDINGS: Brain: There is no acute infarction or intracranial hemorrhage. There is no intracranial mass, mass effect, or edema. There is no hydrocephalus or extra-axial fluid collection.  Ventricles and sulci are within normal limits in size and configuration. Patchy T2 hyperintensity in the supratentorial white matter is nonspecific but may reflect mild chronic microvascular ischemic changes. There are chronic small vessel infarcts of the right thalamus, bilateral basal ganglia, and left internal capsule. No abnormal enhancement. Vascular: Major vessel flow voids at the skull base are preserved. Skull and upper cervical spine: Normal marrow signal is preserved. Sinuses/Orbits: Mild mucosal thickening.  Orbits are unremarkable. Other: Sella is unremarkable.  Mastoid air cells are clear. IMPRESSION: No evidence of intracranial metastatic disease. Chronic small vessel infarcts and chronic microvascular ischemic changes. Electronically Signed   By: Macy Mis M.D.   On: 01/28/2021 08:56   CT CHEST ABDOMEN PELVIS W CONTRAST  Result Date: 01/29/2021 CLINICAL DATA:  Hepatocellular carcinoma, assess treatment response EXAM: CT  CHEST, ABDOMEN, AND PELVIS WITH CONTRAST TECHNIQUE: Multidetector CT imaging of the chest, abdomen and pelvis was performed following the standard protocol during bolus administration of intravenous contrast. CONTRAST:  143mL OMNIPAQUE IOHEXOL 300 MG/ML SOLN, additional oral enteric contrast COMPARISON:  10/09/2020 FINDINGS: CT CHEST FINDINGS Cardiovascular: Right chest port catheter. Aortic atherosclerosis. Normal heart size. Three-vessel coronary artery calcifications. No pericardial effusion. Mediastinum/Nodes: Prominent right hilar lymph node measuring 1.9 x 1.3 cm (series 4, image 87). Thyroid gland, trachea, and esophagus demonstrate no significant findings. Lungs/Pleura: Mild centrilobular and paraseptal emphysema. There are numerous, faint ground-glass opacities scattered throughout the lungs, new compared to prior examination, for example in the posterior right upper lobe (series 4, image 51) and in the medial right lower lobe (series 4, image 91). Scattered  centrilobular and tree-in-bud nodules in the lateral segment right middle lobe seen on prior examination are diminished, consistent with resolving infection or inflammation (series 4, image 101). No pleural effusion or pneumothorax. Musculoskeletal: No chest wall mass or suspicious bone lesions identified. CT ABDOMEN PELVIS FINDINGS Hepatobiliary: Coarse, nodular, cirrhotic morphology of the liver. Hypodense mass of the left lobe of the liver is minimally diminished in size, measuring 7.5 x 5.3 cm, previously 8.0 x 5.8 cm when measured similarly (series 2, image 54). 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 Hypodense satellite lesion of the anterior liver dome is diminished in size measuring 2.1 x 1.7 cm, previously 2.9 x 2.7 cm (series 2, image 47). No gallstones, gallbladder wall thickening, or biliary dilatation. Pancreas: Unremarkable. No pancreatic ductal dilatation or surrounding inflammatory changes. Spleen: Mild splenomegaly, maximum coronal span 14.3 cm. Adrenals/Urinary Tract: Adrenal glands are unremarkable. Kidneys are normal, without renal calculi, solid lesion, or hydronephrosis. Bladder is unremarkable. Stomach/Bowel: Stomach is within normal limits. Appendix is not clearly visualized. No evidence of bowel wall thickening, distention, or inflammatory changes. Vascular/Lymphatic: Severe, mixed aortic atherosclerosis. Unchanged prominent portacaval and celiac axis lymph nodes measuring up to 1.7 x 1.1 cm (series 2, image 62). Reproductive: Mild prostatomegaly. Other: No abdominal wall hernia or abnormality. No abdominopelvic ascites. Musculoskeletal: No acute or significant osseous findings. IMPRESSION: 1. Hypodense mass of the left lobe of the liver and satellite lesion of the liver dome are diminished in size, consistent with treatment response. 2. Unchanged prominent portacaval and celiac axis lymph nodes measuring up to 1.7 x 1.1 cm, possibly reactive to cirrhosis but  modestly suspicious for nodal metastatic disease. Continued attention on follow-up. 3. 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 4. No evidence of metastatic disease in the chest. 5. There are numerous new, faint, scattered ground-glass opacities scattered throughout the lungs, nonspecific and infectious or inflammatory. Metastatic disease is strongly disfavored given this morphology. Attention on follow-up. 6. Prominent right hilar lymph node, unchanged compared to prior examination and possibly reactive to infection or inflammation. Attention on follow-up. 7. Stigmata of cirrhosis. 8. Coronary artery disease. Aortic Atherosclerosis (ICD10-I70.0) and Emphysema (ICD10-J43.9). Electronically Signed   By: Delanna Ahmadi M.D.   On: 01/29/2021 08:18   CT Angio Abd/Pel W and/or Wo Contrast  Result Date: 02/23/2021 CLINICAL DATA:  Lower GI bleeding. History of hepatocellular carcinoma. EXAM: CTA ABDOMEN AND PELVIS WITHOUT AND WITH CONTRAST TECHNIQUE: Multidetector CT imaging of the abdomen and pelvis was performed using the standard protocol during bolus administration of intravenous contrast. Multiplanar reconstructed images and MIPs were obtained and reviewed to evaluate the vascular anatomy. CONTRAST:  147mL OMNIPAQUE IOHEXOL 350 MG/ML SOLN COMPARISON:  CT the  chest, abdomen pelvis-01/28/2021; 10/09/2020 FINDINGS: VASCULAR Aorta: There is a large amount of irregular calcified and noncalcified atherosclerotic plaque throughout the abdominal aorta approaching though not definitively resulting in a hemodynamically significant stenosis. No evidence of abdominal aortic dissection or perivascular stranding. Celiac: Mixed calcified and noncalcified atherosclerotic plaque involves the origin the celiac artery, not resulting in hemodynamically significant stenosis. Conventional branching pattern. SMA: Mixed calcified and noncalcified atherosclerotic plaque involves the origin, proximal aspect and  main trunk of the SMA resulting in tandem areas of at least 50% luminal narrowing (representative axial images 74 and 78, series 5). Conventional branching pattern. The distal tributaries of the SMA appear widely patent without discrete lumen filling defect to suggest distal embolism. Renals: Solitary bilaterally; there is a moderate to large amount of eccentric irregular mixed calcified and noncalcified atherosclerotic plaque involving the origin of the right renal artery resulting in suspected severe narrowing (coronal image 72, series 9). There is a large amount of mixed calcified and noncalcified atherosclerotic plaque throughout the left renal artery resulting in tandem areas of at least 50% luminal narrowing (representative coronal image 78, series 9). These findings are without associated asymmetric renal atrophy or delayed renal enhancement. No vessel irregularity to suggest FMD. IMA: Diseased at its origin though remains patent. Inflow: Post stenting of the left common iliac artery with suspected hemodynamically significant narrowing at its distal aspect (coronal image 79, series 9). Eccentric calcified and noncalcified atherosclerotic plaque results in tandem areas of approximately 50% luminal narrowing involving the left external iliac artery. Moderate amount of mixed calcified and noncalcified atherosclerotic plaque results in tandem areas of at least 50% luminal narrowing throughout the right common and external iliac arteries. Proximal Outflow: Suspected hemodynamically significant narrowings involving the bilateral common and imaged portions of the bilateral superficial femoral arteries. Veins: The IVC and pelvic venous systems appear widely patent. Review of the MIP images confirms the above findings. _________________________________________________________ NON-VASCULAR Lower chest: Limited visualization of the lower thorax demonstrates residual ill-defined ground-glass opacities within the  bilateral lung bases, improved compared to the 01/28/2021 examination. Minimal nodular atelectasis within the left costophrenic angle. No discrete focal airspace opacities. No pleural effusion. Normal heart size. Diffuse decreased attenuation of the intra cardiac blood pool suggestive of anemia. Hepatobiliary: Nodularity of the hepatic contour compatible with known history of cirrhosis. Ill-defined hypoattenuating mass within the left lobe of the liver is grossly unchanged compared to recent examination performed 01/28/2021 measuring at least 7.8 x 5.3 by 6.0 cm (axial image 17, series 6; coronal image 36, series 9, as is a satellite lesion within the dome of the right lobe of the liver measuring 2.4 cm (image 10, series 6). No new discrete hepatic lesions. Normal appearance of the gallbladder given degree distention. No radiopaque gallstones. No intra or extrahepatic biliary duct dilatation. 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 No ascites. Pancreas: Normal caliber of the abdominal aorta. Spleen: Redemonstrated splenomegaly with the spleen measuring 15 cm in length (image 25, series 6). Adrenals/Urinary Tract: There is symmetric enhancement of the bilateral kidneys. No evidence of nephrolithiasis. No discrete renal lesions. No urine obstruction or perinephric stranding. Normal appearance of the bilateral adrenal glands. Normal appearance of the urinary bladder given degree of distention. Stomach/Bowel: Large colonic stool burden, particularly within the rectal vault. There is apparent ill-defined stranding seen adjacent to the hepatic flexure of the colon within the right upper abdominal quadrant (coronal image 55, series 14; axial image 43, series 6) otherwise, no discrete areas of bowel  wall thickening. Normal appearance of the terminal ileum. The appendix is not visualized, however there is no pericecal inflammatory change. Small hiatal hernia with suspected small distal  esophageal varices. No discrete areas of intraluminal contrast extravasation to suggest reported history of GI bleeding. Lymphatic: Redemonstrated mildly prominent though non pathologically enlarged porta hepatis and gastrohepatic ligament lymph nodes, recent unchanged compared to recent staging CT scan with index gastrohepatic lymph node measuring 0.8 cm in greatest short axis diameter (image 25, series 6). No bulky retroperitoneal, mesenteric, pelvic or inguinal lymphadenopathy. Reproductive: Prostatomegaly with mass effect on the undersurface of the urinary bladder. There is a small amount of free fluid in the pelvic cul-de-sac. Other: Regional soft tissues appear normal. Musculoskeletal: No acute or aggressive osseous abnormalities. 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 Mild degenerative change of the bilateral hips with joint space loss, subchondral sclerosis and osteophytosis. IMPRESSION: NON-VASCULAR 1. No discrete areas of intraluminal contrast extravasation to suggest the etiology of reported history of GI bleeding. 2. Suspected minimal amount of stranding adjacent to the hepatic flexure of the colon, the etiology of which is not depicted on this examination. Specifically, no evidence of intraluminal contrast extravasation, enteric obstruction or perforation/definable/drainable fluid collection. 3. 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 4. Suspected distal esophageal varices, again without intraluminal contrast extravasation. Further evaluation with endoscopy could be performed as indicated. VASCULAR 1. Large amount of atherosclerotic plaque throughout the normal caliber abdominal aorta approaching, though not definitively resulting in a hemodynamically significant stenosis. No evidence of  abdominal aortic dissection or perivascular stranding. Aortic Atherosclerosis (ICD10-I70.0). 2. Suspected hemodynamically significant narrowing involving the origin and proximal aspects of the SMA however both the celiac and IMA remain patent. 3. Suspected hemodynamically significant narrowings involving the bilateral renal arteries though without associated delayed renal atrophy or asymmetric renal enhancement. 4. Post stenting of the left common iliac artery with suspected hemodynamically significant narrowing at the distal landing zone of the stent. Suspected hemodynamically significant narrowings involving the left external iliac artery, the left common and imaged portions of the left superficial femoral arteries. Correlation for symptoms of left lower extremity PAD is advised. 5. Suspected hemodynamically significant narrowings involving the right common and external iliac arteries, the right common femoral and imaged portions of the right superficial femoral arteries. Correlation for symptoms of right lower extremity PAD is advised. Electronically Signed   By: Sandi Mariscal M.D.   On: 02/23/2021 15:26    ASSESSMENT: Maxwell Edwards is a 65 year old male who is currently admitted for hematemesis x2 weeks.  PLAN:   Hematemesis- Status post banding of esophageal varices with improvement of his symptoms.  He also received 4 units of PRBC.  Labs have stabilized around 8.  Continue plan per GI.   Unable to see patient today because he was still recovering in PACU from endoscopy.  Dr. Tasia Catchings will see him tomorrow.   I spent 25 minutes dedicated to the care of this patient (face-to-face and non-face-to-face) on the date of the encounter to include what is described in the assessment and plan.  Patient expressed understanding and was in agreement with this plan. He also understands that He can call clinic at any time with any questions, concerns, or complaints.    Cancer Staging  Cancer, hepatocellular  ([place]) Staging form: Liver, AJCC 8th Edition - Clinical  stage from 04/20/2020: Stage IIIB (cT4, cN0, cM0) - Signed by Sindy Guadeloupe, MD on 04/21/2020   Jacquelin Hawking, NP   02/24/2021 4:47 PM

## 2021-02-25 ENCOUNTER — Encounter: Payer: Self-pay | Admitting: Internal Medicine

## 2021-02-25 ENCOUNTER — Encounter: Payer: Self-pay | Admitting: Gastroenterology

## 2021-02-25 LAB — COMPREHENSIVE METABOLIC PANEL
ALT: 27 U/L (ref 0–44)
AST: 37 U/L (ref 15–41)
Albumin: 2.8 g/dL — ABNORMAL LOW (ref 3.5–5.0)
Alkaline Phosphatase: 138 U/L — ABNORMAL HIGH (ref 38–126)
Anion gap: 4 — ABNORMAL LOW (ref 5–15)
BUN: 25 mg/dL — ABNORMAL HIGH (ref 8–23)
CO2: 27 mmol/L (ref 22–32)
Calcium: 7.8 mg/dL — ABNORMAL LOW (ref 8.9–10.3)
Chloride: 104 mmol/L (ref 98–111)
Creatinine, Ser: 0.88 mg/dL (ref 0.61–1.24)
GFR, Estimated: >60 mL/min (ref >60)
Glucose, Bld: 135 mg/dL — ABNORMAL HIGH (ref 70–99)
Potassium: 3.6 mmol/L (ref 3.5–5.1)
Sodium: 135 mmol/L (ref 135–145)
Total Bilirubin: 4.8 mg/dL — ABNORMAL HIGH (ref 0.3–1.2)
Total Protein: 5.7 g/dL — ABNORMAL LOW (ref 6.5–8.1)

## 2021-02-25 LAB — CBC
HCT: 25.7 % — ABNORMAL LOW (ref 39.0–52.0)
Hemoglobin: 8.5 g/dL — ABNORMAL LOW (ref 13.0–17.0)
MCH: 29.9 pg (ref 26.0–34.0)
MCHC: 33.1 g/dL (ref 30.0–36.0)
MCV: 90.5 fL (ref 80.0–100.0)
Platelets: 107 10*3/uL — ABNORMAL LOW (ref 150–400)
RBC: 2.84 MIL/uL — ABNORMAL LOW (ref 4.22–5.81)
RDW: 15.7 % — ABNORMAL HIGH (ref 11.5–15.5)
WBC: 8.8 10*3/uL (ref 4.0–10.5)
nRBC: 0.7 % — ABNORMAL HIGH (ref 0.0–0.2)

## 2021-02-25 MED ORDER — IPRATROPIUM-ALBUTEROL 0.5-2.5 (3) MG/3ML IN SOLN: 3.0000 mL | Freq: Four times a day (QID) | RESPIRATORY_TRACT | Status: DC | PRN

## 2021-02-25 NOTE — Progress Notes (Addendum)
GI Inpatient Follow-up Note  Patient Identification: Maxwell Edwards is a 65 y.o. male with PMHx of cirrhosis secondary to hereditary hemochromatosis, Barrett's esophagus, HCC (not currently on treatment) admitted for melena, anemia, hematemesis.  Subjective: felt well overnight. No BM overnight, no melena/hematochezia. Tolerating bland diet. Denies nausea/vomiting, dark emesis, hematemesis, abd pain. No odynophagia. No other acute GI complaints today. Resting comfortably in bed.  Scheduled Inpatient Medications:   sodium chloride   Intravenous Once    Continuous Inpatient Infusions:    cefTRIAXone (ROCEPHIN)  IV 2 g (02/25/21 0407)   octreotide  (SANDOSTATIN)    IV infusion 50 mcg/hr (02/25/21 0708)   pantoprazole 8 mg/hr (02/25/21 0357)    PRN Inpatient Medications:  acetaminophen, ondansetron (ZOFRAN) IV  Review of Systems: Constitutional: Weight is stable.  Eyes: No changes in vision. ENT: No oral lesions, sore throat.  GI: see HPI.  Heme/Lymph: No easy bruising.  CV: No chest pain.  GU: No hematuria.  Integumentary: No rashes.  Neuro: No headaches.  Psych: No depression/anxiety.  Endocrine: No heat/cold intolerance.  Allergic/Immunologic: No urticaria.  Resp: No cough, SOB.  Musculoskeletal: No joint swelling.    Physical Examination: BP 109/62 (BP Location: Left Arm)    Pulse 69    Temp 97.9 F (36.6 C) (Oral)    Resp 16    Ht 5\' 9"  (1.753 m)    Wt 65.8 kg    SpO2 99%    BMI 21.42 kg/m  Gen: NAD, alert and oriented to person, place. States it is 2002 or 2003 but correctly states president as Barbette Or. Thin. Chronically ill appearing HEENT: PEERLA, EOMI, Neck: supple, no JVD or thyromegaly Chest: CTA bilaterally, no wheezes, crackles, or other adventitious sounds CV: RRR, no m/g/c/r Abd: soft, NT, ND, +BS in all four quadrants; no HSM, guarding, ridigity, or rebound tenderness Ext: no edema, well perfused with 2+ pulses, Skin: no rash or lesions noted Lymph: no  LAD  Data: Lab Results  Component Value Date   WBC 8.8 02/25/2021   HGB 8.5 (L) 02/25/2021   HCT 25.7 (L) 02/25/2021   MCV 90.5 02/25/2021   PLT 107 (L) 02/25/2021   Recent Labs  Lab 02/24/21 1552 02/24/21 2305 02/25/21 0303  HGB 8.0* 7.9* 8.5*   Lab Results  Component Value Date   NA 135 02/25/2021   K 3.6 02/25/2021   CL 104 02/25/2021   CO2 27 02/25/2021   BUN 25 (H) 02/25/2021   CREATININE 0.88 02/25/2021   Lab Results  Component Value Date   ALT 27 02/25/2021   AST 37 02/25/2021   GGT 214 (H) 04/10/2020   ALKPHOS 138 (H) 02/25/2021   BILITOT 4.8 (H) 02/25/2021   Recent Labs  Lab 02/23/21 1557 02/24/21 0509  APTT 29  --   INR  --  1.4*    EGD 02/25/21 - esophageal mucosal changes secondary to short segment barrett's. 2 Grade 2 esophageal varices. Grade A esophagitis. Plan to repeat EGD 2 weeks for banding retreatmen.   Assessment/Plan: Mr. Clutter is a 65 y.o. male admitted for melena, hematemesis in setting of decompensated cirrhosis.    Upper GI bleed - varices banded on EGD yesterday, needs total of 72 hrs of octreotide (began 02/23/21). Currently on PPI gtt but okay to transition to PPI IV BID if access is issue. Clinically hemoglobin improving. Received 4 units total this admission. No BM overnight or rectal bleeding. Given systolic BP border low hold off on beta blocker therapy for now. Plan  for repeat EGD 2 weeks.  [place] - followed by oncology  Barrett's - PPI at D/c     Recommendations:  Continue protonix iv gtt and octreotide gtt for 72 hours total;  Oral PPI BID at d/c IV ABX while inpatient, at d/c transition to oral Cipro 500mg  BID to complete 7 day course total.  Needs repeat EGD 2 weeks for varice follow up (GI office to arrange)   Will follow. Please contact GI with any questions or concerns.   Case discussed w/ Dr. Virgina Jock who also saw patient.  Ronney Asters, PA-C [place]

## 2021-02-25 NOTE — Progress Notes (Signed)
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 S/p EGD which shows Barrett's disease,grade A esophagitis, grade II esophageal varices which were banded. S/p 4 units of pRBCs transfused. Continue on IV octreotide & protonix drips x 72 hrs. Repeat EGD in 2 weeks as per GI    Hepatocellular carcinoma: not undergoing treatment at this time    Smoker: received smoking cessation counseling but does not want to quit    Centrilobular emphysema: continue w/ bronchodilators and encourage incentive spirometry   Hereditary hemochromatosis: been getting weekly phlebotomies due to his iron overload.   Acute blood loss anemia: secondary to esophageal varices. S/p 4 units of pRBCs transfused. H&H are trending up    CAD: continue to hold home dose of aspirin    Thrombocytopenia: etiology unclear. Will continue to monitor   Leukocytosis: resolved    DVT prophylaxis: SCDs Code Status: full  Family Communication: Disposition Plan: depends on PT/OT recs   Level of care: Telemetry Medical  Status is: Inpatient  Remains inpatient appropriate because: severity of illness, still on IV PPI and octreotide drips     Consultants:  GI   Procedures:   Antimicrobials:   Subjective: Pt c/o fatigue   Objective: Vitals:   02/24/21 1706 02/24/21 1823 02/24/21 2009 02/25/21 0522  BP: (!) 117/55 (!) 104/57 110/61 (!) 101/51  Pulse: 74 72 73 79  Resp: 20 16 17 17   Temp: 97.6 F (36.4 C) 98.1 F (36.7 C) 98.1 F (36.7 C) 98.1  F (36.7 C)  TempSrc: Axillary Oral Oral   SpO2: 98% 100% 98% 100%  Weight:      Height:        Intake/Output Summary (Last 24 hours) at 02/25/2021 0754 Last data filed at 02/25/2021 0500 Gross per 24 hour  Intake 1231.75 ml  Output 800 ml  Net 431.75 ml   Filed Weights   02/23/21 1343 02/24/21 0941  Weight: 65.8 kg 65.8 kg    Examination:  General exam: Appears comfortable. Frail appearing. Appears older than stated age  Respiratory system: clear breath sounds b/l  Cardiovascular system: S1/S2+. No rubs or clicks  Gastrointestinal system: Abd is soft, NT, ND & hypoactive bowel sounds  Central nervous system: alert and oriented. Moves all extremities Psychiatry: judgement and insight appears normal. Flat mood and affect     Data Reviewed: I have personally reviewed following labs and imaging studies  CBC: Recent Labs  Lab 02/23/21 1339 02/24/21 0326 02/24/21 0509 02/24/21 1552 02/24/21 2305 02/25/21 0303  WBC 15.6* 17.5* 15.8*  --   --  8.8  NEUTROABS 11.9*  --   --   --   --   --   HGB 4.7* 7.1* 6.7* 8.0* 7.9* 8.5*  HCT 15.9* 21.6* 20.4* 24.2* 23.8* 25.7*  MCV 94.6 90.8 91.1  --   --  90.5  PLT 261 118* 113*  --   --  413*   Basic Metabolic Panel: Recent Labs  Lab 02/23/21 1339 02/24/21 0509 02/25/21 0303  NA 135  131* 135  K 4.3 3.9 3.6  CL 104 103 104  CO2 18* 23 27  GLUCOSE 133* 117* 135*  BUN 37* 37* 25*  CREATININE 1.11 1.02 0.88  CALCIUM 8.0* 7.9* 7.8*   GFR: Estimated Creatinine Clearance: 77.9 mL/min (by C-G formula based on SCr of 0.88 mg/dL). Liver Function Tests: Recent Labs  Lab 02/23/21 1339 02/24/21 0509 02/25/21 0303  AST 55* 33 37  ALT 38 29 27  ALKPHOS 189* 127* 138*  BILITOT 0.7 2.4* 4.8*  PROT 6.0* 5.4* 5.7*  ALBUMIN 2.5* 2.7* 2.8*   No results for input(s): LIPASE, AMYLASE in the last 168 hours. Recent Labs  Lab 02/23/21 1339  AMMONIA 12   Coagulation Profile: Recent Labs  Lab 02/23/21 1339 02/24/21 0509   INR 1.4* 1.4*   Cardiac Enzymes: No results for input(s): CKTOTAL, CKMB, CKMBINDEX, TROPONINI in the last 168 hours. BNP (last 3 results) No results for input(s): PROBNP in the last 8760 hours. HbA1C: No results for input(s): HGBA1C in the last 72 hours. CBG: No results for input(s): GLUCAP in the last 168 hours. Lipid Profile: No results for input(s): CHOL, HDL, LDLCALC, TRIG, CHOLHDL, LDLDIRECT in the last 72 hours. Thyroid Function Tests: No results for input(s): TSH, T4TOTAL, FREET4, T3FREE, THYROIDAB in the last 72 hours. Anemia Panel: No results for input(s): VITAMINB12, FOLATE, FERRITIN, TIBC, IRON, RETICCTPCT in the last 72 hours. Sepsis Labs: Recent Labs  Lab 02/23/21 1557 02/23/21 2024  LATICACIDVEN 6.9* 3.7*    Recent Results (from the past 240 hour(s))  Resp Panel by RT-PCR (Flu A&B, Covid) Nasopharyngeal Swab     Status: None   Collection Time: 02/23/21  1:39 PM   Specimen: Nasopharyngeal Swab; Nasopharyngeal(NP) swabs in vial transport medium  Result Value Ref Range Status   SARS Coronavirus 2 by RT PCR NEGATIVE NEGATIVE Final    Comment: (NOTE) SARS-CoV-2 target nucleic acids are NOT DETECTED.  The SARS-CoV-2 RNA is generally detectable in upper respiratory specimens during the acute phase of infection. The lowest concentration of SARS-CoV-2 viral copies this assay can detect is 138 copies/mL. A negative result does not preclude SARS-Cov-2 infection and should not be used as the sole basis for treatment or other patient management decisions. A negative result may occur with  improper specimen collection/handling, submission of specimen other than nasopharyngeal swab, presence of viral mutation(s) within the areas targeted by this assay, and inadequate number of viral copies(<138 copies/mL). A negative result must be combined with clinical observations, patient history, and epidemiological information. The expected result is Negative.  Fact Sheet for  Patients:  EntrepreneurPulse.com.au  Fact Sheet for Healthcare Providers:  IncredibleEmployment.be  This test is no t yet approved or cleared by the [place] FDA and  has been authorized for detection and/or diagnosis of SARS-CoV-2 by FDA under an Emergency Use Authorization (EUA). This EUA will remain  in effect (meaning this test can be used) for the duration of the COVID-19 declaration under Section 564(b)(1) of the Act, 21 U.S.C.section 360bbb-3(b)(1), unless the authorization is terminated  or revoked sooner.       Influenza A by PCR NEGATIVE NEGATIVE Final   Influenza B by PCR NEGATIVE NEGATIVE Final    Comment: (NOTE) The Xpert Xpress SARS-CoV-2/FLU/RSV plus assay is intended as an aid in the diagnosis of influenza from Nasopharyngeal swab specimens and should not be used as a sole basis for treatment. Nasal washings and aspirates are unacceptable for Xpert Xpress SARS-CoV-2/FLU/RSV testing.  Fact Sheet for Patients: EntrepreneurPulse.com.au  Fact Sheet for Healthcare Providers: IncredibleEmployment.be  This test is not yet approved or cleared by the [place] FDA and has been authorized for detection and/or diagnosis of SARS-CoV-2 by FDA under an Emergency Use Authorization (EUA). This EUA will remain in effect (meaning this test can be used) for the duration of the COVID-19 declaration under Section 564(b)(1) of the Act, 21 U.S.C. section 360bbb-3(b)(1), unless the authorization is terminated or revoked.  Performed at [facility], [address] [place], [place] [address]   Blood culture (routine x 2)     Status: None (Preliminary result)   Collection Time: 02/23/21  3:57 PM   Specimen: BLOOD  Result Value Ref Range Status   Specimen Description BLOOD LEFT [facility]  Final   Special Requests   Final    BOTTLES DRAWN AEROBIC AND ANAEROBIC Blood Culture adequate volume   Culture    Final    NO GROWTH 2 DAYS Performed at [facility], [address], [place], [place] [address]    Report Status PENDING  Incomplete  Culture, blood (Routine X 2) w Reflex to ID Panel     Status: None (Preliminary result)   Collection Time: 02/25/21  3:03 AM   Specimen: BLOOD  Result Value Ref Range Status   Specimen Description BLOOD LEFT ANTECUBITAL  Final   Special Requests   Final    BOTTLES DRAWN AEROBIC AND ANAEROBIC Blood Culture adequate volume   Culture   Final    NO GROWTH < 12 HOURS Performed at [facility], [address] [place], [place] [address]    Report Status PENDING  Incomplete         Radiology Studies: CT Angio Abd/Pel W and/or Wo Contrast  Result Date: 02/23/2021 CLINICAL DATA:  Lower GI bleeding. History of hepatocellular carcinoma. EXAM: CTA ABDOMEN AND PELVIS WITHOUT AND WITH CONTRAST TECHNIQUE: Multidetector CT imaging of the abdomen and pelvis was performed using the standard protocol during bolus administration of intravenous contrast. Multiplanar reconstructed images and MIPs were obtained and reviewed to evaluate the vascular anatomy. CONTRAST:  114mL OMNIPAQUE IOHEXOL 350 MG/ML SOLN COMPARISON:  CT the chest, abdomen pelvis-01/28/2021; 10/09/2020 FINDINGS: VASCULAR Aorta: There is a large amount of irregular calcified and noncalcified atherosclerotic plaque throughout the abdominal aorta approaching though not definitively resulting in a hemodynamically significant stenosis. No evidence of abdominal aortic dissection or perivascular stranding. Celiac: Mixed calcified and noncalcified atherosclerotic plaque involves the origin the celiac artery, not resulting in hemodynamically significant stenosis. Conventional branching pattern. SMA: Mixed calcified and noncalcified atherosclerotic plaque involves the origin, proximal aspect and main trunk of the SMA resulting in tandem areas of at least 50% luminal narrowing (representative axial  images 74 and 78, series 5). Conventional branching pattern. The distal tributaries of the SMA appear widely patent without discrete lumen filling defect to suggest distal embolism. Renals: Solitary bilaterally; there is a moderate to large amount of eccentric irregular mixed calcified and noncalcified atherosclerotic plaque involving the origin of the right renal artery resulting in suspected severe narrowing (coronal image 72, series 9). There is a large amount of mixed calcified and noncalcified atherosclerotic plaque throughout the left renal artery resulting in tandem areas of at least 50% luminal narrowing (representative coronal image 78, series 9). These findings are without associated asymmetric renal atrophy or delayed renal enhancement. No vessel irregularity to suggest FMD. IMA: Diseased at its origin though remains patent. Inflow: Post stenting of the left common iliac artery with suspected hemodynamically significant narrowing  at its distal aspect (coronal image 79, series 9). Eccentric calcified and noncalcified atherosclerotic plaque results in tandem areas of approximately 50% luminal narrowing involving the left external iliac artery. Moderate amount of mixed calcified and noncalcified atherosclerotic plaque results in tandem areas of at least 50% luminal narrowing throughout the right common and external iliac arteries. Proximal Outflow: Suspected hemodynamically significant narrowings involving the bilateral common and imaged portions of the bilateral superficial femoral arteries. Veins: The IVC and pelvic venous systems appear widely patent. Review of the MIP images confirms the above findings. _________________________________________________________ NON-VASCULAR Lower chest: Limited visualization of the lower thorax demonstrates residual ill-defined ground-glass opacities within the bilateral lung bases, improved compared to the 01/28/2021 examination. Minimal nodular atelectasis within the  left costophrenic angle. No discrete focal airspace opacities. No pleural effusion. Normal heart size. Diffuse decreased attenuation of the intra cardiac blood pool suggestive of anemia. Hepatobiliary: Nodularity of the hepatic contour compatible with known history of cirrhosis. Ill-defined hypoattenuating mass within the left lobe of the liver is grossly unchanged compared to recent examination performed 01/28/2021 measuring at least 7.8 x 5.3 by 6.0 cm (axial image 17, series 6; coronal image 36, series 9, as is a satellite lesion within the dome of the right lobe of the liver measuring 2.4 cm (image 10, series 6). No new discrete hepatic lesions. Normal appearance of the gallbladder given degree distention. No radiopaque gallstones. No intra or extrahepatic biliary duct dilatation. 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 No ascites. Pancreas: Normal caliber of the abdominal aorta. Spleen: Redemonstrated splenomegaly with the spleen measuring 15 cm in length (image 25, series 6). Adrenals/Urinary Tract: There is symmetric enhancement of the bilateral kidneys. No evidence of nephrolithiasis. No discrete renal lesions. No urine obstruction or perinephric stranding. Normal appearance of the bilateral adrenal glands. Normal appearance of the urinary bladder given degree of distention. Stomach/Bowel: Large colonic stool burden, particularly within the rectal vault. There is apparent ill-defined stranding seen adjacent to the hepatic flexure of the colon within the right upper abdominal quadrant (coronal image 55, series 14; axial image 43, series 6) otherwise, no discrete areas of bowel wall thickening. Normal appearance of the terminal ileum. The appendix is not visualized, however there is no pericecal inflammatory change. Small hiatal hernia with suspected small distal esophageal varices. No discrete areas of intraluminal contrast extravasation to suggest reported history of GI  bleeding. Lymphatic: Redemonstrated mildly prominent though non pathologically enlarged porta hepatis and gastrohepatic ligament lymph nodes, recent unchanged compared to recent staging CT scan with index gastrohepatic lymph node measuring 0.8 cm in greatest short axis diameter (image 25, series 6). No bulky retroperitoneal, mesenteric, pelvic or inguinal lymphadenopathy. Reproductive: Prostatomegaly with mass effect on the undersurface of the urinary bladder. There is a small amount of free fluid in the pelvic cul-de-sac. Other: Regional soft tissues appear normal. Musculoskeletal: No acute or aggressive osseous abnormalities. 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 Mild degenerative change of the bilateral hips with joint space loss, subchondral sclerosis and osteophytosis. IMPRESSION: NON-VASCULAR 1. No discrete areas of intraluminal contrast extravasation to suggest the etiology of reported history of GI bleeding. 2. Suspected minimal amount of stranding adjacent to the hepatic flexure of the colon, the etiology of which is not depicted on this examination. Specifically, no evidence of intraluminal contrast extravasation, enteric obstruction or perforation/definable/drainable fluid collection. 3. 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 4. Suspected distal esophageal varices, again without intraluminal contrast extravasation. Further evaluation with endoscopy could be performed as indicated. VASCULAR 1. Large amount of atherosclerotic plaque throughout the normal caliber abdominal aorta approaching, though not definitively resulting in a hemodynamically significant stenosis. No evidence of abdominal aortic dissection or perivascular stranding. Aortic Atherosclerosis (ICD10-I70.0). 2. Suspected  hemodynamically significant narrowing involving the origin and proximal aspects of the SMA however both the celiac and IMA remain patent. 3. Suspected hemodynamically significant narrowings involving the bilateral renal arteries though without associated delayed renal atrophy or asymmetric renal enhancement. 4. Post stenting of the left common iliac artery with suspected hemodynamically significant narrowing at the distal landing zone of the stent. Suspected hemodynamically significant narrowings involving the left external iliac artery, the left common and imaged portions of the left superficial femoral arteries. Correlation for symptoms of left lower extremity PAD is advised. 5. Suspected hemodynamically significant narrowings involving the right common and external iliac arteries, the right common femoral and imaged portions of the right superficial femoral arteries. Correlation for symptoms of right lower extremity PAD is advised. Electronically Signed   By: Sandi Mariscal M.D.   On: 02/23/2021 15:26        Scheduled Meds:  sodium chloride   Intravenous Once   Continuous Infusions:  cefTRIAXone (ROCEPHIN)  IV 2 g (02/25/21 0407)   octreotide  (SANDOSTATIN)    IV infusion 50 mcg/hr (02/25/21 0708)   pantoprazole 8 mg/hr (02/25/21 0357)     LOS: 2 days    Time spent: 25 mins     Wyvonnia Dusky, MD [facility] Pager [phone]  If 7PM-7AM, please contact night-coverage 02/25/2021, 7:54 AM

## 2021-02-26 ENCOUNTER — Encounter: Payer: Self-pay | Admitting: Oncology

## 2021-02-26 ENCOUNTER — Telehealth: Payer: Self-pay | Admitting: Oncology

## 2021-02-26 ENCOUNTER — Encounter: Payer: Self-pay | Admitting: Gastroenterology

## 2021-02-26 LAB — COMPREHENSIVE METABOLIC PANEL
ALT: 23 U/L (ref 0–44)
AST: 30 U/L (ref 15–41)
Albumin: 2.3 g/dL — ABNORMAL LOW (ref 3.5–5.0)
Alkaline Phosphatase: 132 U/L — ABNORMAL HIGH (ref 38–126)
Anion gap: 3 — ABNORMAL LOW (ref 5–15)
BUN: 17 mg/dL (ref 8–23)
CO2: 25 mmol/L (ref 22–32)
Calcium: 7.5 mg/dL — ABNORMAL LOW (ref 8.9–10.3)
Chloride: 106 mmol/L (ref 98–111)
Creatinine, Ser: 0.79 mg/dL (ref 0.61–1.24)
GFR, Estimated: >60 mL/min (ref >60)
Glucose, Bld: 109 mg/dL — ABNORMAL HIGH (ref 70–99)
Potassium: 3.8 mmol/L (ref 3.5–5.1)
Sodium: 134 mmol/L — ABNORMAL LOW (ref 135–145)
Total Bilirubin: 3.3 mg/dL — ABNORMAL HIGH (ref 0.3–1.2)
Total Protein: 5 g/dL — ABNORMAL LOW (ref 6.5–8.1)

## 2021-02-26 LAB — CBC
HCT: 22.6 % — ABNORMAL LOW (ref 39.0–52.0)
Hemoglobin: 7.2 g/dL — ABNORMAL LOW (ref 13.0–17.0)
MCH: 29.6 pg (ref 26.0–34.0)
MCHC: 31.9 g/dL (ref 30.0–36.0)
MCV: 93 fL (ref 80.0–100.0)
Platelets: 99 10*3/uL — ABNORMAL LOW (ref 150–400)
RBC: 2.43 MIL/uL — ABNORMAL LOW (ref 4.22–5.81)
RDW: 16.9 % — ABNORMAL HIGH (ref 11.5–15.5)
WBC: 7.1 10*3/uL (ref 4.0–10.5)
nRBC: 0.4 % — ABNORMAL HIGH (ref 0.0–0.2)

## 2021-02-26 LAB — ABO/RH: ABO/RH(D): A POS

## 2021-02-26 MED ORDER — LACTULOSE 10 GM/15ML PO SOLN
20.0000 g | Freq: Three times a day (TID) | ORAL | Status: DC
Start: 2021-02-26 — End: 2021-02-27
  Administered 2021-02-26 – 2021-02-27 (×3): 20 g via ORAL
  Filled 2021-02-26 (×3): qty 30

## 2021-02-26 MED ORDER — PANTOPRAZOLE SODIUM 40 MG PO TBEC
40.0000 mg | DELAYED_RELEASE_TABLET | Freq: Two times a day (BID) | ORAL | Status: DC
  Administered 2021-02-26 – 2021-02-27 (×3): 40 mg via ORAL
  Filled 2021-02-26 (×3): qty 1

## 2021-02-26 NOTE — Progress Notes (Signed)
OT Screen Note  Patient Details Name: Maxwell Edwards MRN: [id number] DOB: 27-Apr-1955   Cancelled Treatment:    Reason Eval/Treat Not Completed: OT screened, no needs identified, will sign off. Order received, chart reviewed. Spoke with PT who just evaluated the pt. Per PT, pt back to baseline functional independence. No skilled OT needs identified. Will sign off. Please re-consult if additional needs arise.   Ardeth Perfect., MPH, MS, OTR/L ascom [phone] 02/26/21, 11:57 AM

## 2021-02-26 NOTE — Anesthesia Postprocedure Evaluation (Signed)
Anesthesia Post Note  Patient: Maxwell Edwards  Procedure(s) Performed: ESOPHAGOGASTRODUODENOSCOPY (EGD) WITH PROPOFOL  Patient location during evaluation: PACU Anesthesia Type: General Level of consciousness: awake and alert Pain management: pain level controlled Vital Signs Assessment: post-procedure vital signs reviewed and stable Respiratory status: spontaneous breathing, nonlabored ventilation, respiratory function stable and patient connected to nasal cannula oxygen Cardiovascular status: blood pressure returned to baseline and stable Postop Assessment: no apparent nausea or vomiting Anesthetic complications: no   No notable events documented.   Last Vitals:  Vitals:   02/26/21 0628 02/26/21 0832  BP: 105/61 (!) 90/57  Pulse: 66 (!) 58  Resp:  18  Temp:  (!) 36.3 C  SpO2: 97% 100%    Last Pain:  Vitals:   02/26/21 0945  TempSrc:   PainSc: 0-No pain                 Molli Barrows

## 2021-02-26 NOTE — Care Plan (Signed)
Able to speak to Maxwell Edwards on the phone. Discussed current plan and patient status, addressing concerns such as hgb change, etc.  Addressed post discharge care and what to look out for including but not limited to- fever confusion hematemesis, melena, etc.  An opportunity for questions was provided and appropriate answers given. She verbalized understanding and was appreciative of the phone call.  Discussed case with nursing and hospitalist services  Annamaria Helling, [facility]

## 2021-02-26 NOTE — Progress Notes (Signed)
PROGRESS NOTE   HPI was taken from Dr. Bridgett Larsson: 65 year old male with a history of hepatocellular carcinoma, coronary disease, hereditary hemochromatosis on weekly phlebotomy, history of esophageal varices without bleeding, COPD who presents to the ER today with a week history of black tarry stools and 2-day history of hematemesis.  Patient's ex-wife is is also his caretaker.  She states that patient told her about the bloody stools today.  She has not seen them in about 3 to 4 days.  Patient states he has been vomiting blood for the last 2 days.  Ex-wife states that he drank a little bit of water today and started vomiting black blood today.  Patient has felt tired.  No abdominal pain.  No chest pain.   On arrival to the ER, temperature 98.5 heart rate 96 blood pressure 101/61.  Repeat blood pressure 85/47 and then again 97/49.  Patient started on 1 of 2 unit packed red blood transfusions in the ER.  GI has been consulted by EDP  Due to the patient's acute blood loss anemia, GI bleed, [facility] hospitalist contacted for admission.    ED Course:  He was noted to be 4.7.  Patient started on 2 units of packed red blood cells.  Protonix and octreotide given.   Hospital course from Dr. Jimmye Norman 12/18-12/20/22: Pt presented w/ acute GI bleeding. Pt is s/p EGD which showed barrett's disease, grade A esophagitis, grade II esophageal varices which were banded. Pt did receive 4 units of pRBCs in this hospital stay. Hb is 7.2 today but pt denies any hemoptysis, melena, hematuria and/or hematemesis. Pt can likely d/c tomorrow if H&H are stable and/or trending up.     Maxwell Edwards  MAU:[id number] DOB: 1955-07-04 DOA: 02/23/2021 PCP: Venita Lick, NP   Assessment & Plan:   Principal Problem:   Acute GI bleeding Active Problems:   CAD (coronary artery disease)   Nicotine dependence, cigarettes, uncomplicated   Centrilobular emphysema (HCC)   Cancer, hepatocellular (HCC)   Hereditary hemochromatosis  ([place])   Iron overload   Acute blood loss anemia   DNR (do not resuscitate)/DNI(Do Not Intubate)   GI bleed   Acute GI bleeding: advance to soft diet. S/p EGD which shows Barrett's disease,grade A esophagitis, grade II esophageal varices which were banded. S/p 4 units of pRBCs transfused. Completed protonix & octreotide drips. Continue on po PPI. Repeat EGD in 2 weeks as per GI. Continue on abxs x 7 days (day #4/7) as per GI    Hepatocellular carcinoma: not undergoing treatment at this time    Smoker: received smoking cessation counseling but does not want to quit smoking    Centrilobular emphysema: continue w/ bronchodilators and encourage incentive spirometry   Hereditary hemochromatosis: been getting weekly phlebotomies due to his iron overload.   Acute blood loss anemia: secondary to esophageal varices. H&H are labile. S/p 4 units of pRBCs transfused this admission    CAD: continue to hold home dose of aspirin.   Thrombocytopenia: etiology unclear. Will continue to monitor   Leukocytosis: resolved    DVT prophylaxis: SCDs Code Status: full  Family Communication: discussed pt's care w/ pt's ex wife, Max, and answer her questions  Disposition Plan: likely d/c back home   Level of care: Telemetry Medical  Status is: Inpatient  Remains inpatient appropriate because: severity of illness, can d/c tomorrow if H&H are stable and/or trending up      Consultants:  GI   Procedures:   Antimicrobials:   Subjective: Pt  denies any complaints    Objective: Vitals:   02/25/21 2005 02/26/21 0340 02/26/21 0343 02/26/21 0628  BP: 126/60  (!) 90/50 105/61  Pulse: 72  (!) 59 66  Resp: 16  20   Temp: 97.9 F (36.6 C)  97.9 F (36.6 C)   TempSrc: Oral  Oral   SpO2: 100%  98% 97%  Weight:  69.9 kg    Height:        Intake/Output Summary (Last 24 hours) at 02/26/2021 0746 Last data filed at 02/26/2021 0339 Gross per 24 hour  Intake 1012.5 ml  Output 650 ml  Net 362.5 ml    Filed Weights   02/23/21 1343 02/24/21 0941 02/26/21 0340  Weight: 65.8 kg 65.8 kg 69.9 kg    Examination:  General exam: Appears comfortable. Disheveled. Appears older than stated age  Respiratory system: clear breath sounds b/l. No rales  Cardiovascular system: S1 & S2+. No rubs or clicks   Gastrointestinal system: Abd is soft, NT, ND & normal bowel sounds  Central nervous system: Alert and oriented. Moves all extremities  Psychiatry: judgement and insight appears normal. Flat mood and affect     Data Reviewed: I have personally reviewed following labs and imaging studies  CBC: Recent Labs  Lab 02/23/21 1339 02/24/21 0326 02/24/21 0509 02/24/21 1552 02/24/21 2305 02/25/21 0303  WBC 15.6* 17.5* 15.8*  --   --  8.8  NEUTROABS 11.9*  --   --   --   --   --   HGB 4.7* 7.1* 6.7* 8.0* 7.9* 8.5*  HCT 15.9* 21.6* 20.4* 24.2* 23.8* 25.7*  MCV 94.6 90.8 91.1  --   --  90.5  PLT 261 118* 113*  --   --  308*   Basic Metabolic Panel: Recent Labs  Lab 02/23/21 1339 02/24/21 0509 02/25/21 0303  NA 135 131* 135  K 4.3 3.9 3.6  CL 104 103 104  CO2 18* 23 27  GLUCOSE 133* 117* 135*  BUN 37* 37* 25*  CREATININE 1.11 1.02 0.88  CALCIUM 8.0* 7.9* 7.8*   GFR: Estimated Creatinine Clearance: 82.7 mL/min (by C-G formula based on SCr of 0.88 mg/dL). Liver Function Tests: Recent Labs  Lab 02/23/21 1339 02/24/21 0509 02/25/21 0303  AST 55* 33 37  ALT 38 29 27  ALKPHOS 189* 127* 138*  BILITOT 0.7 2.4* 4.8*  PROT 6.0* 5.4* 5.7*  ALBUMIN 2.5* 2.7* 2.8*   No results for input(s): LIPASE, AMYLASE in the last 168 hours. Recent Labs  Lab 02/23/21 1339  AMMONIA 12   Coagulation Profile: Recent Labs  Lab 02/23/21 1339 02/24/21 0509  INR 1.4* 1.4*   Cardiac Enzymes: No results for input(s): CKTOTAL, CKMB, CKMBINDEX, TROPONINI in the last 168 hours. BNP (last 3 results) No results for input(s): PROBNP in the last 8760 hours. HbA1C: No results for input(s): HGBA1C in  the last 72 hours. CBG: No results for input(s): GLUCAP in the last 168 hours. Lipid Profile: No results for input(s): CHOL, HDL, LDLCALC, TRIG, CHOLHDL, LDLDIRECT in the last 72 hours. Thyroid Function Tests: No results for input(s): TSH, T4TOTAL, FREET4, T3FREE, THYROIDAB in the last 72 hours. Anemia Panel: No results for input(s): VITAMINB12, FOLATE, FERRITIN, TIBC, IRON, RETICCTPCT in the last 72 hours. Sepsis Labs: Recent Labs  Lab 02/23/21 1557 02/23/21 2024  LATICACIDVEN 6.9* 3.7*    Recent Results (from the past 240 hour(s))  Resp Panel by RT-PCR (Flu A&B, Covid) Nasopharyngeal Swab     Status: None  Collection Time: 02/23/21  1:39 PM   Specimen: Nasopharyngeal Swab; Nasopharyngeal(NP) swabs in vial transport medium  Result Value Ref Range Status   SARS Coronavirus 2 by RT PCR NEGATIVE NEGATIVE Final    Comment: (NOTE) SARS-CoV-2 target nucleic acids are NOT DETECTED.  The SARS-CoV-2 RNA is generally detectable in upper respiratory specimens during the acute phase of infection. The lowest concentration of SARS-CoV-2 viral copies this assay can detect is 138 copies/mL. A negative result does not preclude SARS-Cov-2 infection and should not be used as the sole basis for treatment or other patient management decisions. A negative result may occur with  improper specimen collection/handling, submission of specimen other than nasopharyngeal swab, presence of viral mutation(s) within the areas targeted by this assay, and inadequate number of viral copies(<138 copies/mL). A negative result must be combined with clinical observations, patient history, and epidemiological information. The expected result is Negative.  Fact Sheet for Patients:  EntrepreneurPulse.com.au  Fact Sheet for Healthcare Providers:  IncredibleEmployment.be  This test is no t yet approved or cleared by the [place] FDA and  has been authorized for detection  and/or diagnosis of SARS-CoV-2 by FDA under an Emergency Use Authorization (EUA). This EUA will remain  in effect (meaning this test can be used) for the duration of the COVID-19 declaration under Section 564(b)(1) of the Act, 21 U.S.C.section 360bbb-3(b)(1), unless the authorization is terminated  or revoked sooner.       Influenza A by PCR NEGATIVE NEGATIVE Final   Influenza B by PCR NEGATIVE NEGATIVE Final    Comment: (NOTE) The Xpert Xpress SARS-CoV-2/FLU/RSV plus assay is intended as an aid in the diagnosis of influenza from Nasopharyngeal swab specimens and should not be used as a sole basis for treatment. Nasal washings and aspirates are unacceptable for Xpert Xpress SARS-CoV-2/FLU/RSV testing.  Fact Sheet for Patients: EntrepreneurPulse.com.au  Fact Sheet for Healthcare Providers: IncredibleEmployment.be  This test is not yet approved or cleared by the [place] FDA and has been authorized for detection and/or diagnosis of SARS-CoV-2 by FDA under an Emergency Use Authorization (EUA). This EUA will remain in effect (meaning this test can be used) for the duration of the COVID-19 declaration under Section 564(b)(1) of the Act, 21 U.S.C. section 360bbb-3(b)(1), unless the authorization is terminated or revoked.  Performed at [facility], [address] [place], [place] [address]   Blood culture (routine x 2)     Status: None (Preliminary result)   Collection Time: 02/23/21  3:57 PM   Specimen: BLOOD  Result Value Ref Range Status   Specimen Description BLOOD LEFT [facility]  Final   Special Requests   Final    BOTTLES DRAWN AEROBIC AND ANAEROBIC Blood Culture adequate volume   Culture   Final    NO GROWTH 3 DAYS Performed at [facility], [address], [place], [place] [address]    Report Status PENDING  Incomplete  Culture, blood (Routine X 2) w Reflex to ID Panel     Status: None (Preliminary result)    Collection Time: 02/25/21  3:03 AM   Specimen: BLOOD  Result Value Ref Range Status   Specimen Description BLOOD LEFT ANTECUBITAL  Final   Special Requests   Final    BOTTLES DRAWN AEROBIC AND ANAEROBIC Blood Culture adequate volume   Culture   Final    NO GROWTH 1 DAY Performed at [facility], [address] [place], [place] [address]    Report Status PENDING  Incomplete  Radiology Studies: No results found.      Scheduled Meds:  sodium chloride   Intravenous Once   Continuous Infusions:  cefTRIAXone (ROCEPHIN)  IV 2 g (02/26/21 0339)   octreotide  (SANDOSTATIN)    IV infusion 50 mcg/hr (02/26/21 0313)   pantoprazole 8 mg/hr (02/26/21 0500)     LOS: 3 days    Time spent: 15 mins     Wyvonnia Dusky, MD [facility] Pager [phone]  If 7PM-7AM, please contact night-coverage 02/26/2021, 7:46 AM

## 2021-02-26 NOTE — Progress Notes (Addendum)
Inpatient Follow-up/Progress Note   Patient ID: Maxwell Edwards is a 65 y.o. male.  Overnight Events / Subjective Findings NAEON.  Labs today pending. No further melena, n/v. Tolerating po. No other acute GI complaints.  Review of Systems  Constitutional:  Negative for activity change, appetite change, chills, diaphoresis, fatigue, fever and unexpected weight change.  HENT:  Negative for trouble swallowing and voice change.   Respiratory:  Negative for shortness of breath and wheezing.   Cardiovascular:  Negative for chest pain, palpitations and leg swelling.  Gastrointestinal:  Negative for abdominal distention, abdominal pain, anal bleeding, blood in stool (resolved), constipation, diarrhea, nausea and vomiting.  Musculoskeletal:  Negative for arthralgias and myalgias.  Skin:  Positive for color change (jaundice). Negative for pallor.  Neurological:  Negative for dizziness, syncope and weakness.  Psychiatric/Behavioral:  Negative for confusion. The patient is not nervous/anxious.   All other systems reviewed and are negative.   Medications  Current Facility-Administered Medications:    0.9 %  sodium chloride infusion (Manually program via Guardrails IV Fluids), , Intravenous, Once, Annamaria Helling, DO   acetaminophen (TYLENOL) suppository 650 mg, 650 mg, Rectal, Q4H PRN, Kristopher Oppenheim, DO   cefTRIAXone (ROCEPHIN) 2 g in sodium chloride 0.9 % 100 mL IVPB, 2 g, Intravenous, Q24H, Rise Patience, MD, Last Rate: 200 mL/hr at 02/26/21 0339, 2 g at 02/26/21 0339   ipratropium-albuterol (DUONEB) 0.5-2.5 (3) MG/3ML nebulizer solution 3 mL, 3 mL, Nebulization, Q6H PRN, Wyvonnia Dusky, MD   [COMPLETED] octreotide (SANDOSTATIN) 2 mcg/mL load via infusion 50 mcg, 50 mcg, Intravenous, Once, 50 mcg at 02/23/21 1758 **AND** octreotide (SANDOSTATIN) 500 mcg in sodium chloride 0.9 % 250 mL (2 mcg/mL) infusion, 50 mcg/hr, Intravenous, Continuous, Kristopher Oppenheim, DO, Last Rate: 25 mL/hr at  02/26/21 0313, 50 mcg/hr at 02/26/21 0313   ondansetron (ZOFRAN) injection 4 mg, 4 mg, Intravenous, Q6H PRN, Kristopher Oppenheim, DO   pantoprozole (PROTONIX) 80 mg /NS 100 mL infusion, 8 mg/hr, Intravenous, Continuous, Kristopher Oppenheim, DO, Last Rate: 10 mL/hr at 02/26/21 0500, 8 mg/hr at 02/26/21 0500  cefTRIAXone (ROCEPHIN)  IV 2 g (02/26/21 0339)   octreotide  (SANDOSTATIN)    IV infusion 50 mcg/hr (02/26/21 0313)   pantoprazole 8 mg/hr (02/26/21 0500)       Objective    Vitals:   02/25/21 2005 02/26/21 0340 02/26/21 0343 02/26/21 0628  BP: 126/60  (!) 90/50 105/61  Pulse: 72  (!) 59 66  Resp: 16  20   Temp: 97.9 F (36.6 C)  97.9 F (36.6 C)   TempSrc: Oral  Oral   SpO2: 100%  98% 97%  Weight:  69.9 kg    Height:         Physical Exam Vitals and nursing note reviewed.  Constitutional:      General: He is not in acute distress.    Appearance: He is ill-appearing (chronically). He is not toxic-appearing or diaphoretic.     Comments: Frail appearing; thin   HENT:     Head: Normocephalic and atraumatic.     Nose: Nose normal.     Mouth/Throat:     Mouth: Mucous membranes are moist.     Pharynx: Oropharynx is clear.     Comments: Edentulous. Sublingual telangiectasias Eyes:     General: Scleral icterus present.     Extraocular Movements: Extraocular movements intact.  Cardiovascular:     Rate and Rhythm: Normal rate and regular rhythm.     Heart sounds: Murmur heard.  No friction rub. No gallop.  Pulmonary:     Effort: Pulmonary effort is normal. No respiratory distress.     Breath sounds: Normal breath sounds. No wheezing, rhonchi or rales.     Comments: Diminished bilaterally Abdominal:     General: Abdomen is flat. Bowel sounds are normal. There is no distension.     Palpations: Abdomen is soft.     Tenderness: There is no abdominal tenderness. There is no guarding or rebound.  Musculoskeletal:     Cervical back: Neck supple.     Right lower leg: No edema.     Left  lower leg: No edema.  Skin:    General: Skin is warm and dry.     Coloration: Skin is jaundiced and pale.  Neurological:     General: No focal deficit present.     Mental Status: He is alert and oriented to person, place, and time. Mental status is at baseline.  Psychiatric:        Mood and Affect: Mood normal.        Behavior: Behavior normal.        Thought Content: Thought content normal.        Judgment: Judgment normal.    Laboratory Data Recent Labs  Lab 02/23/21 1339 02/24/21 0326 02/24/21 0509 02/24/21 1552 02/24/21 2305 02/25/21 0303  WBC 15.6* 17.5* 15.8*  --   --  8.8  HGB 4.7* 7.1* 6.7* 8.0* 7.9* 8.5*  HCT 15.9* 21.6* 20.4* 24.2* 23.8* 25.7*  PLT 261 118* 113*  --   --  107*  NEUTOPHILPCT 75  --   --   --   --   --   LYMPHOPCT 13  --   --   --   --   --   MONOPCT 10  --   --   --   --   --   EOSPCT 0  --   --   --   --   --    Recent Labs  Lab 02/24/21 0509 02/25/21 0303 02/26/21 0729  NA 131* 135 134*  K 3.9 3.6 3.8  CL 103 104 106  CO2 23 27 25   BUN 37* 25* 17  CREATININE 1.02 0.88 0.79  CALCIUM 7.9* 7.8* 7.5*  PROT 5.4* 5.7* 5.0*  BILITOT 2.4* 4.8* 3.3*  ALKPHOS 127* 138* 132*  ALT 29 27 23   AST 33 37 30  GLUCOSE 117* 135* 109*   Recent Labs  Lab 02/23/21 1339 02/24/21 0509  INR 1.4* 1.4*      Imaging Studies: No results found.  Assessment:   # UGIB - 2/2 esophageal varices bleeding. 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 (GI office to arrange)  Monitor H&H.  Transfusion and resuscitation as per primary team. Avoid frequent lab draws to prevent further lab induced anemia LFT, CBC, INR daily Goal hemoglobin between 7 and 9. Continue protonix iv gtt and octreotide gtt for 72 hours total; Can transition to twice daily protonix po 40 mg after that Ceftriaxone 1 g for total of 7 days abx (d4/7); can transition on d/c to oral Cipro 571m BID to complete days remaining of 7 day course of total abx Maintain at least two sites of iv access Supportive care as per primary team  Addendum: Patient's daughter called the office and reported her father having issues with confusion. In my evaluation, he has been lucid and conversant, but these spells may be intermittent. I recommend he start lactulose tid with goal of 3 soft BM per day.  GI to sign off. Available as needed. Please do not hesitate to call regarding questions or concerns.  I personally performed the service.  Management of other medical comorbidities as per primary team  Thank you for allowing [facility] participate in this patient's care.  SAnnamaria Helling DO [facility]  Portions of the record may have been created with voice recognition software. Occasional wrong-word or 'sound-a-like' substitutions may have occurred due to the inherent limitations of voice recognition software.  Read the chart carefully and recognize, using context, where substitutions may have occurred.

## 2021-02-26 NOTE — Telephone Encounter (Signed)
Wife called to let us know that pt is in the hospital. Will be d/c on 12-21. Wife wants someone to sit down on Friday 12-23 and tell her what all is going on.

## 2021-02-26 NOTE — Evaluation (Signed)
Physical Therapy Evaluation Patient Details Name: Maxwell Edwards MRN: [id number] DOB: 1955-11-03 Today's Date: 02/26/2021  History of Present Illness  Pt admitted for GI bleed and is s/p 4 blood transfusions with hgb at 7.2 this date. History includes cirrhosis secondary to hemochamtosis and Barretts esophagus.  Clinical Impression  Pt is a pleasant 65 year old male who was admitted for acute GI bleed.  Eager to perform mobility. Pt demonstrates all bed mobility/transfers/ambulation at baseline level. Pt does not require any further PT needs at this time. Pt will be dc in house and does not require follow up. RN aware. Will dc current orders.      Recommendations for follow up therapy are one component of a multi-disciplinary discharge planning process, led by the attending physician.  Recommendations may be updated based on patient status, additional functional criteria and insurance authorization.  Follow Up Recommendations No PT follow up    Assistance Recommended at Discharge None  Functional Status Assessment Patient has not had a recent decline in their functional status  Equipment Recommendations  None recommended by PT    Recommendations for Other Services       Precautions / Restrictions Precautions Precautions: None Restrictions Weight Bearing Restrictions: No      Mobility  Bed Mobility Overal bed mobility: Independent             General bed mobility comments: safe technique    Transfers Overall transfer level: Independent Equipment used: None               General transfer comment: safe technique    Ambulation/Gait Ambulation/Gait assistance: Independent Gait Distance (Feet): 200 Feet Assistive device: None Gait Pattern/deviations: WFL(Within Functional Limits)       General Gait Details: safe technique with ability to maintain conversation with mobility. No LOB noted  Stairs            Wheelchair Mobility    Modified Rankin  (Stroke Patients Only)       Balance Overall balance assessment: Independent                                           Pertinent Vitals/Pain Pain Assessment: No/denies pain    Home Living Family/patient expects to be discharged to:: Private residence Living Arrangements: Alone Available Help at Discharge: Friend(s) Type of Home: House Home Access: Stairs to enter Entrance Stairs-Rails: Can reach both Entrance Stairs-Number of Steps: 3   Home Layout: One level Home Equipment: None      Prior Function Prior Level of Function : Independent/Modified Independent             Mobility Comments: reports he will have support once he discharges- reports no falls       Hand Dominance        Extremity/Trunk Assessment   Upper Extremity Assessment Upper Extremity Assessment: Overall WFL for tasks assessed    Lower Extremity Assessment Lower Extremity Assessment: Overall WFL for tasks assessed       Communication   Communication: No difficulties  Cognition Arousal/Alertness: Awake/alert Behavior During Therapy: WFL for tasks assessed/performed Overall Cognitive Status: Within Functional Limits for tasks assessed  General Comments      Exercises     Assessment/Plan    PT Assessment Patient does not need any further PT services  PT Problem List         PT Treatment Interventions      PT Goals (Current goals can be found in the Care Plan section)  Acute Rehab PT Goals Patient Stated Goal: to go home PT Goal Formulation: All assessment and education complete, DC therapy Time For Goal Achievement: 02/26/21 Potential to Achieve Goals: Good    Frequency     Barriers to discharge        Co-evaluation               AM-PAC PT "6 Clicks" Mobility  Outcome Measure Help needed turning from your back to your side while in a flat bed without using bedrails?: None Help needed  moving from lying on your back to sitting on the side of a flat bed without using bedrails?: None Help needed moving to and from a bed to a chair (including a wheelchair)?: None Help needed standing up from a chair using your arms (e.g., wheelchair or bedside chair)?: None Help needed to walk in hospital room?: None Help needed climbing 3-5 steps with a railing? : None 6 Click Score: 24    End of Session Equipment Utilized During Treatment: Gait belt Activity Tolerance: Patient tolerated treatment well Patient left: in chair Nurse Communication: Mobility status PT Visit Diagnosis: Muscle weakness (generalized) (M62.81)    Time: 4827-0786 PT Time Calculation (min) (ACUTE ONLY): 10 min   Charges:   PT Evaluation $PT Eval Low Complexity: 1 Low          Greggory Stallion, PT, DPT [phone]   Maxwell Edwards 02/26/2021, 11:50 AM

## 2021-02-26 NOTE — Care Management Important Message (Signed)
Important Message  Patient Details  Name: Maxwell Edwards MRN: [id number] Date of Birth: 03-21-1955   Medicare Important Message Given:  Yes     Dannette Barbara 02/26/2021, 11:13 AM

## 2021-02-27 ENCOUNTER — Encounter: Payer: Self-pay | Admitting: Oncology

## 2021-02-27 DIAGNOSIS — D62 Acute posthemorrhagic anemia: Secondary | ICD-10-CM

## 2021-02-27 LAB — COMPREHENSIVE METABOLIC PANEL
ALT: 25 U/L (ref 0–44)
AST: 32 U/L (ref 15–41)
Albumin: 2.4 g/dL — ABNORMAL LOW (ref 3.5–5.0)
Alkaline Phosphatase: 139 U/L — ABNORMAL HIGH (ref 38–126)
Anion gap: 5 (ref 5–15)
BUN: 16 mg/dL (ref 8–23)
CO2: 25 mmol/L (ref 22–32)
Calcium: 7.6 mg/dL — ABNORMAL LOW (ref 8.9–10.3)
Chloride: 106 mmol/L (ref 98–111)
Creatinine, Ser: 0.83 mg/dL (ref 0.61–1.24)
GFR, Estimated: >60 mL/min (ref >60)
Glucose, Bld: 106 mg/dL — ABNORMAL HIGH (ref 70–99)
Potassium: 3.9 mmol/L (ref 3.5–5.1)
Sodium: 136 mmol/L (ref 135–145)
Total Bilirubin: 4.3 mg/dL — ABNORMAL HIGH (ref 0.3–1.2)
Total Protein: 5.2 g/dL — ABNORMAL LOW (ref 6.5–8.1)

## 2021-02-27 LAB — TYPE AND SCREEN
ABO/RH(D): A POS
Antibody Screen: NEGATIVE
Unit division: 0
Unit division: 0
Unit division: 0
Unit division: 0
Unit division: 0
Unit division: 0

## 2021-02-27 LAB — CBC
HCT: 22.7 % — ABNORMAL LOW (ref 39.0–52.0)
Hemoglobin: 7.1 g/dL — ABNORMAL LOW (ref 13.0–17.0)
MCH: 30 pg (ref 26.0–34.0)
MCHC: 31.3 g/dL (ref 30.0–36.0)
MCV: 95.8 fL (ref 80.0–100.0)
Platelets: 103 10*3/uL — ABNORMAL LOW (ref 150–400)
RBC: 2.37 MIL/uL — ABNORMAL LOW (ref 4.22–5.81)
RDW: 19 % — ABNORMAL HIGH (ref 11.5–15.5)
WBC: 6.3 10*3/uL (ref 4.0–10.5)
nRBC: 0 % (ref 0.0–0.2)

## 2021-02-27 LAB — BPAM RBC
Blood Product Expiration Date: 202301172359
Blood Product Expiration Date: 202301172359
Blood Product Expiration Date: 202301172359
Blood Product Expiration Date: 202301172359
Blood Product Expiration Date: 202301182359
Blood Product Expiration Date: 202301182359
ISSUE DATE / TIME: 202212171445
ISSUE DATE / TIME: 202212171740
ISSUE DATE / TIME: 202212172047
ISSUE DATE / TIME: 202212180908
Unit Type and Rh: 6200
Unit Type and Rh: 6200
Unit Type and Rh: 6200
Unit Type and Rh: 6200
Unit Type and Rh: 6200
Unit Type and Rh: 6200

## 2021-02-27 LAB — ABO/RH: ABO/RH(D): A POS

## 2021-02-27 LAB — PREPARE RBC (CROSSMATCH)

## 2021-02-27 MED ORDER — PANTOPRAZOLE SODIUM 40 MG PO TBEC: 40.0000 mg | DELAYED_RELEASE_TABLET | Freq: Two times a day (BID) | ORAL | 0 refills | Status: DC

## 2021-02-27 MED ORDER — LACTULOSE 10 GM/15ML PO SOLN: 20.0000 g | Freq: Three times a day (TID) | ORAL | 0 refills | Status: AC

## 2021-02-27 MED ORDER — CIPROFLOXACIN HCL 500 MG PO TABS: 500.0000 mg | ORAL_TABLET | Freq: Two times a day (BID) | ORAL | 0 refills | Status: AC

## 2021-02-27 NOTE — Discharge Summary (Signed)
Physician Discharge Summary  Maxwell Edwards FAO:[id number] DOB: 19-Apr-1955 DOA: 02/23/2021  PCP: Venita Lick, NP  Admit date: 02/23/2021 Discharge date: 02/27/2021  Discharge disposition: Home   Recommendations for Outpatient Follow-Up:   Follow-up with PCP in 1 week. Follow-up with gastroenterologist for repeat EGD in 2 weeks.   Discharge Diagnosis:   Principal Problem:   Acute GI bleeding Active Problems:   CAD (coronary artery disease)   Nicotine dependence, cigarettes, uncomplicated   Centrilobular emphysema (HCC)   Cancer, hepatocellular (HCC)   Hereditary hemochromatosis (Barrett)   Iron overload   Acute blood loss anemia   DNR (do not resuscitate)/DNI(Do Not Intubate)   GI bleed    Discharge Condition: Stable.  Diet recommendation:  Diet Order             Diet general           DIET SOFT Room service appropriate? 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  He was hypotensive in the ED.  He was admitted to the hospital for acute upper GI bleeding complicated by acute blood loss anemia.  His hemoglobin was 4.7 on admission.  He was treated with IV Protonix, IV fluids, IV ceftriaxone and IV octreotide.  He was transfused with 4 units of packed red blood cells. EGD revealed Barrett's esophagus, grade 2 esophageal varices that were banded and grade a esophagitis.  His condition has improved.  Posttransfusion hemoglobin remained stable.   Medical Consultants:   Gastroenterologist   Discharge Exam:    Vitals:   02/26/21 1546 02/26/21 1937 02/27/21 0401 02/27/21 0828   BP: 105/67 113/63 (!) 102/51 106/65  Pulse: 67 72 66 62  Resp: 17 20 20 18   Temp: (!) 97.4 F (36.3 C) 97.8 F (36.6 C) 97.7 F (36.5 C) (!) 97.3 F (36.3 C)  TempSrc: Oral Oral Oral Oral  SpO2: 100% 100% 98% 100%  Weight:   66.7 kg   Height:         GEN: NAD SKIN: Warm and dry EYES: No pallor or icterus ENT: MMM CV: RRR PULM: CTA B ABD: soft, ND, NT, +BS CNS: AAO x 3, non focal EXT: No edema or tenderness   The results of significant diagnostics from this hospitalization (including imaging, microbiology, ancillary and laboratory) are listed below for reference.     Procedures and Diagnostic Studies:   CT Angio Abd/Pel W and/or Wo Contrast  Result Date: 02/23/2021 CLINICAL DATA:  Lower GI bleeding. History of hepatocellular carcinoma. EXAM: CTA ABDOMEN AND PELVIS WITHOUT AND WITH CONTRAST TECHNIQUE: Multidetector CT imaging of the abdomen and pelvis was performed using the standard protocol during bolus administration of intravenous contrast. Multiplanar reconstructed images and MIPs were obtained and reviewed to evaluate the vascular anatomy. CONTRAST:  145mL OMNIPAQUE IOHEXOL 350 MG/ML SOLN COMPARISON:  CT the chest, abdomen pelvis-01/28/2021; 10/09/2020 FINDINGS: VASCULAR Aorta: There is a large amount of irregular calcified and noncalcified atherosclerotic plaque throughout the abdominal aorta approaching though not definitively resulting in a hemodynamically significant stenosis. No evidence of abdominal aortic dissection or  perivascular stranding. Celiac: Mixed calcified and noncalcified atherosclerotic plaque involves the origin the celiac artery, not resulting in hemodynamically significant stenosis. Conventional branching pattern. SMA: Mixed calcified and noncalcified atherosclerotic plaque involves the origin, proximal aspect and main trunk of the SMA resulting in tandem areas of at least 50% luminal narrowing (representative axial images 74 and 78, series 5).  Conventional branching pattern. The distal tributaries of the SMA appear widely patent without discrete lumen filling defect to suggest distal embolism. Renals: Solitary bilaterally; there is a moderate to large amount of eccentric irregular mixed calcified and noncalcified atherosclerotic plaque involving the origin of the right renal artery resulting in suspected severe narrowing (coronal image 72, series 9). There is a large amount of mixed calcified and noncalcified atherosclerotic plaque throughout the left renal artery resulting in tandem areas of at least 50% luminal narrowing (representative coronal image 78, series 9). These findings are without associated asymmetric renal atrophy or delayed renal enhancement. No vessel irregularity to suggest FMD. IMA: Diseased at its origin though remains patent. Inflow: Post stenting of the left common iliac artery with suspected hemodynamically significant narrowing at its distal aspect (coronal image 79, series 9). Eccentric calcified and noncalcified atherosclerotic plaque results in tandem areas of approximately 50% luminal narrowing involving the left external iliac artery. Moderate amount of mixed calcified and noncalcified atherosclerotic plaque results in tandem areas of at least 50% luminal narrowing throughout the right common and external iliac arteries. Proximal Outflow: Suspected hemodynamically significant narrowings involving the bilateral common and imaged portions of the bilateral superficial femoral arteries. Veins: The IVC and pelvic venous systems appear widely patent. Review of the MIP images confirms the above findings. _________________________________________________________ NON-VASCULAR Lower chest: Limited visualization of the lower thorax demonstrates residual ill-defined ground-glass opacities within the bilateral lung bases, improved compared to the 01/28/2021 examination. Minimal nodular atelectasis within the left costophrenic angle. No  discrete focal airspace opacities. No pleural effusion. Normal heart size. Diffuse decreased attenuation of the intra cardiac blood pool suggestive of anemia. Hepatobiliary: Nodularity of the hepatic contour compatible with known history of cirrhosis. Ill-defined hypoattenuating mass within the left lobe of the liver is grossly unchanged compared to recent examination performed 01/28/2021 measuring at least 7.8 x 5.3 by 6.0 cm (axial image 17, series 6; coronal image 36, series 9, as is a satellite lesion within the dome of the right lobe of the liver measuring 2.4 cm (image 10, series 6). No new discrete hepatic lesions. Normal appearance of the gallbladder given degree distention. No radiopaque gallstones. No intra or extrahepatic biliary duct dilatation. 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 No ascites. Pancreas: Normal caliber of the abdominal aorta. Spleen: Redemonstrated splenomegaly with the spleen measuring 15 cm in length (image 25, series 6). Adrenals/Urinary Tract: There is symmetric enhancement of the bilateral kidneys. No evidence of nephrolithiasis. No discrete renal lesions. No urine obstruction or perinephric stranding. Normal appearance of the bilateral adrenal glands. Normal appearance of the urinary bladder given degree of distention. Stomach/Bowel: Large colonic stool burden, particularly within the rectal vault. There is apparent ill-defined stranding seen adjacent to the hepatic flexure of the colon within the right upper abdominal quadrant (coronal image 55, series 14; axial image 43, series 6) otherwise, no discrete areas of bowel wall thickening. Normal appearance of the terminal ileum. The appendix is not visualized, however there is no pericecal inflammatory change. Small hiatal hernia with suspected small distal esophageal varices. No discrete areas of intraluminal contrast extravasation to suggest reported history  of GI bleeding. Lymphatic: Redemonstrated  mildly prominent though non pathologically enlarged porta hepatis and gastrohepatic ligament lymph nodes, recent unchanged compared to recent staging CT scan with index gastrohepatic lymph node measuring 0.8 cm in greatest short axis diameter (image 25, series 6). No bulky retroperitoneal, mesenteric, pelvic or inguinal lymphadenopathy. Reproductive: Prostatomegaly with mass effect on the undersurface of the urinary bladder. There is a small amount of free fluid in the pelvic cul-de-sac. Other: Regional soft tissues appear normal. Musculoskeletal: No acute or aggressive osseous abnormalities. 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 Mild degenerative change of the bilateral hips with joint space loss, subchondral sclerosis and osteophytosis. IMPRESSION: NON-VASCULAR 1. No discrete areas of intraluminal contrast extravasation to suggest the etiology of reported history of GI bleeding. 2. Suspected minimal amount of stranding adjacent to the hepatic flexure of the colon, the etiology of which is not depicted on this examination. Specifically, no evidence of intraluminal contrast extravasation, enteric obstruction or perforation/definable/drainable fluid collection. 3. 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 4. Suspected distal esophageal varices, again without intraluminal contrast extravasation. Further evaluation with endoscopy could be performed as indicated. VASCULAR 1. Large amount of atherosclerotic plaque throughout the normal caliber abdominal aorta approaching, though not definitively resulting in a hemodynamically significant stenosis. No evidence of abdominal aortic dissection or perivascular stranding. Aortic Atherosclerosis (ICD10-I70.0). 2. Suspected hemodynamically significant narrowing involving  the origin and proximal aspects of the SMA however both the celiac and IMA remain patent. 3. Suspected hemodynamically significant narrowings involving the bilateral renal arteries though without associated delayed renal atrophy or asymmetric renal enhancement. 4. Post stenting of the left common iliac artery with suspected hemodynamically significant narrowing at the distal landing zone of the stent. Suspected hemodynamically significant narrowings involving the left external iliac artery, the left common and imaged portions of the left superficial femoral arteries. Correlation for symptoms of left lower extremity PAD is advised. 5. Suspected hemodynamically significant narrowings involving the right common and external iliac arteries, the right common femoral and imaged portions of the right superficial femoral arteries. Correlation for symptoms of right lower extremity PAD is advised. Electronically Signed   By: Sandi Mariscal M.D.   On: 02/23/2021 15:26     Labs:   Basic Metabolic Panel: Recent Labs  Lab 02/23/21 1339 02/24/21 0509 02/25/21 0303 02/26/21 0729 02/27/21 0522  NA 135 131* 135 134* 136  K 4.3 3.9 3.6 3.8 3.9  CL 104 103 104 106 106  CO2 18* 23 27 25 25   GLUCOSE 133* 117* 135* 109* 106*  BUN 37* 37* 25* 17 16  CREATININE 1.11 1.02 0.88 0.79 0.83  CALCIUM 8.0* 7.9* 7.8* 7.5* 7.6*   GFR Estimated Creatinine Clearance: 83.7 mL/min (by C-G formula based on SCr of 0.83 mg/dL). Liver Function Tests: Recent Labs  Lab 02/23/21 1339 02/24/21 0509 02/25/21 0303 02/26/21 0729 02/27/21 0522  AST 55* 33 37 30 32  ALT 38 29 27 23 25   ALKPHOS 189* 127* 138* 132* 139*  BILITOT 0.7 2.4* 4.8* 3.3* 4.3*  PROT 6.0* 5.4* 5.7* 5.0* 5.2*  ALBUMIN 2.5* 2.7* 2.8* 2.3* 2.4*   No results for input(s): LIPASE, AMYLASE in the last 168 hours. Recent Labs  Lab 02/23/21 1339  AMMONIA 12   Coagulation profile Recent Labs  Lab 02/23/21 1339 02/24/21 0509  INR 1.4* 1.4*  CBC: Recent Labs  Lab 02/23/21 1339 02/24/21 0326 02/24/21 0509 02/24/21 1552 02/24/21 2305 02/25/21 0303 02/26/21 0729 02/27/21 0522  WBC 15.6* 17.5* 15.8*  --   --  8.8 7.1 6.3  NEUTROABS 11.9*  --   --   --   --   --   --   --   HGB 4.7* 7.1* 6.7* 8.0* 7.9* 8.5* 7.2* 7.1*  HCT 15.9* 21.6* 20.4* 24.2* 23.8* 25.7* 22.6* 22.7*  MCV 94.6 90.8 91.1  --   --  90.5 93.0 95.8  PLT 261 118* 113*  --   --  107* 99* 103*   Cardiac Enzymes: No results for input(s): CKTOTAL, CKMB, CKMBINDEX, TROPONINI in the last 168 hours. BNP: Invalid input(s): POCBNP CBG: No results for input(s): GLUCAP in the last 168 hours. D-Dimer No results for input(s): DDIMER in the last 72 hours. Hgb A1c No results for input(s): HGBA1C in the last 72 hours. Lipid Profile No results for input(s): CHOL, HDL, LDLCALC, TRIG, CHOLHDL, LDLDIRECT in the last 72 hours. Thyroid function studies No results for input(s): TSH, T4TOTAL, T3FREE, THYROIDAB in the last 72 hours.  Invalid input(s): FREET3 Anemia work up No results for input(s): VITAMINB12, FOLATE, FERRITIN, TIBC, IRON, RETICCTPCT in the last 72 hours. Microbiology Recent Results (from the past 240 hour(s))  Resp Panel by RT-PCR (Flu A&B, Covid) Nasopharyngeal Swab     Status: None   Collection Time: 02/23/21  1:39 PM   Specimen: Nasopharyngeal Swab; Nasopharyngeal(NP) swabs in vial transport medium  Result Value Ref Range Status   SARS Coronavirus 2 by RT PCR NEGATIVE NEGATIVE Final    Comment: (NOTE) SARS-CoV-2 target nucleic acids are NOT DETECTED.  The SARS-CoV-2 RNA is generally detectable in upper respiratory specimens during the acute phase of infection. The lowest concentration of SARS-CoV-2 viral copies this assay can detect is 138 copies/mL. A negative result does not preclude SARS-Cov-2 infection and should not be used as the sole basis for treatment or other patient management decisions. A negative result may occur with  improper  specimen collection/handling, submission of specimen other than nasopharyngeal swab, presence of viral mutation(s) within the areas targeted by this assay, and inadequate number of viral copies(<138 copies/mL). A negative result must be combined with clinical observations, patient history, and epidemiological information. The expected result is Negative.  Fact Sheet for Patients:  EntrepreneurPulse.com.au  Fact Sheet for Healthcare Providers:  IncredibleEmployment.be  This test is no t yet approved or cleared by the [place] FDA and  has been authorized for detection and/or diagnosis of SARS-CoV-2 by FDA under an Emergency Use Authorization (EUA). This EUA will remain  in effect (meaning this test can be used) for the duration of the COVID-19 declaration under Section 564(b)(1) of the Act, 21 U.S.C.section 360bbb-3(b)(1), unless the authorization is terminated  or revoked sooner.       Influenza A by PCR NEGATIVE NEGATIVE Final   Influenza B by PCR NEGATIVE NEGATIVE Final    Comment: (NOTE) The Xpert Xpress SARS-CoV-2/FLU/RSV plus assay is intended as an aid in the diagnosis of influenza from Nasopharyngeal swab specimens and should not be used as a sole basis for treatment. Nasal washings and aspirates are unacceptable for Xpert Xpress SARS-CoV-2/FLU/RSV testing.  Fact Sheet for Patients: EntrepreneurPulse.com.au  Fact Sheet for Healthcare Providers: IncredibleEmployment.be  This test is not yet approved or cleared by the [place] FDA and has been authorized for detection and/or diagnosis of SARS-CoV-2 by FDA under an Emergency Use Authorization (EUA).  This EUA will remain in effect (meaning this test can be used) for the duration of the COVID-19 declaration under Section 564(b)(1) of the Act, 21 U.S.C. section 360bbb-3(b)(1), unless the authorization is terminated or revoked.  Performed at  [facility], [address] [place], [place] [address]   Blood culture (routine x 2)     Status: None (Preliminary result)   Collection Time: 02/23/21  3:57 PM   Specimen: BLOOD  Result Value Ref Range Status   Specimen Description BLOOD LEFT AC  Final   Special Requests   Final    BOTTLES DRAWN AEROBIC AND ANAEROBIC Blood Culture adequate volume   Culture   Final    NO GROWTH 3 DAYS Performed at [facility], [address], [place], [place] [address]    Report Status PENDING  Incomplete  Culture, blood (Routine X 2) w Reflex to ID Panel     Status: None (Preliminary result)   Collection Time: 02/25/21  3:03 AM   Specimen: BLOOD  Result Value Ref Range Status   Specimen Description BLOOD LEFT ANTECUBITAL  Final   Special Requests   Final    BOTTLES DRAWN AEROBIC AND ANAEROBIC Blood Culture adequate volume   Culture   Final    NO GROWTH 1 DAY Performed at [facility], [address], [place], [place] [address]    Report Status PENDING  Incomplete     Discharge Instructions:   Discharge Instructions     Diet general   Complete by: As directed    Increase activity slowly   Complete by: As directed       Allergies as of 02/27/2021   No Known Allergies      Medication List     STOP taking these medications    aspirin EC 81 MG tablet   omeprazole 40 MG capsule Commonly known as: PRILOSEC       TAKE these medications    ciprofloxacin 500 MG tablet Commonly known as: Cipro Take 1 tablet (500 mg total) by mouth 2 (two) times daily for 2 days.   lactulose 10 GM/15ML solution Commonly known as: CHRONULAC Take 30 mLs (20 g total) by mouth 3 (three) times daily.   pantoprazole 40 MG tablet Commonly known as: PROTONIX Take 1 tablet (40 mg total) by mouth 2 (two) times daily.           If you experience worsening of your admission symptoms, develop shortness of breath, life threatening emergency, suicidal or homicidal  thoughts you must seek medical attention immediately by calling 911 or calling your MD immediately  if symptoms less severe.   You must read complete instructions/literature along with all the possible adverse reactions/side effects for all the medicines you take and that have been prescribed to you. Take any new medicines after you have completely understood and accept all the possible adverse reactions/side effects.    Please note   You were cared for by a hospitalist during your hospital stay. If you have any questions about your discharge medications or the care you received while you were in the hospital after you are discharged, you can call the unit and asked to speak with the hospitalist on call if the hospitalist that took care of you is not available. Once you are discharged, your primary care physician will handle any further medical issues. Please note that NO REFILLS for any discharge medications will be authorized once you are discharged, as it is imperative that you  return to your primary care physician (or establish a relationship with a primary care physician if you do not have one) for your aftercare needs so that they can reassess your need for medications and monitor your lab values.       Time coordinating discharge: 33 minutes  Signed:  Srihan Brutus  [facility] 02/27/2021, 12:47 PM   Pager on [email or web address]. If 7PM-7AM, please contact night-coverage at [email or web address]

## 2021-02-27 NOTE — Plan of Care (Signed)
°  Problem: Education: Goal: Knowledge of General Education information will improve Description: Including pain rating scale, medication(s)/side effects and non-pharmacologic comfort measures 02/27/2021 0938 by Eustace Pen, RN Outcome: Progressing 02/27/2021 0938 by Eustace Pen, RN Outcome: Progressing   Problem: Safety: Goal: Ability to remain free from injury will improve 02/27/2021 0938 by Eustace Pen, RN Outcome: Progressing 02/27/2021 0938 by Eustace Pen, RN Outcome: Progressing

## 2021-02-27 NOTE — Progress Notes (Signed)
Mobility Specialist - Progress Note   02/27/21 1200  Mobility  Activity Ambulated in hall  Level of Assistance Independent  Assistive Device None  Distance Ambulated (ft) 360 ft  Mobility Ambulated independently in hallway  Mobility Response Tolerated well  Mobility performed by Mobility specialist  $Mobility charge 1 Mobility     Pre-mobility: 82 HR, 100% SpO2 Post-mobility: 75 HR, 99% SpO2   Pt ambulated in hallway independently. No physical assist for transfers. No complaints. Pt inquiring about possible d/c this date. Family at bedside.    Kathee Delton Mobility Specialist 02/27/21, 12:31 PM

## 2021-02-28 ENCOUNTER — Other Ambulatory Visit: Payer: Self-pay

## 2021-02-28 ENCOUNTER — Inpatient Hospital Stay: Payer: 59

## 2021-02-28 ENCOUNTER — Other Ambulatory Visit: Payer: Self-pay | Admitting: Oncology

## 2021-02-28 DIAGNOSIS — Z5112 Encounter for antineoplastic immunotherapy: Secondary | ICD-10-CM | POA: Diagnosis not present

## 2021-02-28 DIAGNOSIS — D649 Anemia, unspecified: Secondary | ICD-10-CM

## 2021-02-28 DIAGNOSIS — C220 Liver cell carcinoma: Secondary | ICD-10-CM

## 2021-02-28 LAB — COMPREHENSIVE METABOLIC PANEL
ALT: 31 U/L (ref 0–44)
AST: 44 U/L — ABNORMAL HIGH (ref 15–41)
Albumin: 3 g/dL — ABNORMAL LOW (ref 3.5–5.0)
Alkaline Phosphatase: 211 U/L — ABNORMAL HIGH (ref 38–126)
Anion gap: 9 (ref 5–15)
BUN: 16 mg/dL (ref 8–23)
CO2: 24 mmol/L (ref 22–32)
Calcium: 8 mg/dL — ABNORMAL LOW (ref 8.9–10.3)
Chloride: 101 mmol/L (ref 98–111)
Creatinine, Ser: 0.96 mg/dL (ref 0.61–1.24)
GFR, Estimated: >60 mL/min (ref >60)
Glucose, Bld: 113 mg/dL — ABNORMAL HIGH (ref 70–99)
Potassium: 3.9 mmol/L (ref 3.5–5.1)
Sodium: 134 mmol/L — ABNORMAL LOW (ref 135–145)
Total Bilirubin: 2.6 mg/dL — ABNORMAL HIGH (ref 0.3–1.2)
Total Protein: 6.3 g/dL — ABNORMAL LOW (ref 6.5–8.1)

## 2021-02-28 LAB — CBC WITH DIFFERENTIAL/PLATELET
Abs Immature Granulocytes: 0.04 10*3/uL (ref 0.00–0.07)
Basophils Absolute: 0.1 10*3/uL (ref 0.0–0.1)
Basophils Relative: 1 %
Eosinophils Absolute: 0.1 10*3/uL (ref 0.0–0.5)
Eosinophils Relative: 1 %
HCT: 28.1 % — ABNORMAL LOW (ref 39.0–52.0)
Hemoglobin: 8.5 g/dL — ABNORMAL LOW (ref 13.0–17.0)
Immature Granulocytes: 1 %
Lymphocytes Relative: 17 %
Lymphs Abs: 1.3 10*3/uL (ref 0.7–4.0)
MCH: 30.1 pg (ref 26.0–34.0)
MCHC: 30.2 g/dL (ref 30.0–36.0)
MCV: 99.6 fL (ref 80.0–100.0)
Monocytes Absolute: 0.7 10*3/uL (ref 0.1–1.0)
Monocytes Relative: 9 %
Neutro Abs: 5.9 10*3/uL (ref 1.7–7.7)
Neutrophils Relative %: 71 %
Platelets: 120 10*3/uL — ABNORMAL LOW (ref 150–400)
RBC: 2.82 MIL/uL — ABNORMAL LOW (ref 4.22–5.81)
RDW: 21.7 % — ABNORMAL HIGH (ref 11.5–15.5)
WBC: 8.1 10*3/uL (ref 4.0–10.5)
nRBC: 0 % (ref 0.0–0.2)

## 2021-02-28 LAB — CULTURE, BLOOD (ROUTINE X 2)
Culture: NO GROWTH
Special Requests: ADEQUATE

## 2021-03-01 ENCOUNTER — Inpatient Hospital Stay: Payer: 59

## 2021-03-01 ENCOUNTER — Inpatient Hospital Stay: Payer: 59 | Admitting: Oncology

## 2021-03-01 LAB — PREPARE RBC (CROSSMATCH)

## 2021-03-02 LAB — CULTURE, BLOOD (ROUTINE X 2)
Culture: NO GROWTH
Special Requests: ADEQUATE

## 2021-03-05 ENCOUNTER — Encounter: Payer: Self-pay | Admitting: Internal Medicine

## 2021-03-05 ENCOUNTER — Encounter: Payer: Self-pay | Admitting: Oncology

## 2021-03-05 LAB — BPAM RBC
Blood Product Expiration Date: 202301192359
Unit Type and Rh: 6200

## 2021-03-05 LAB — TYPE AND SCREEN
ABO/RH(D): A POS
Antibody Screen: NEGATIVE
Unit division: 0

## 2021-03-05 NOTE — Telephone Encounter (Signed)
Pt got new appts an given to family

## 2021-03-05 NOTE — Telephone Encounter (Signed)
This comment I think was the med about dr Mickeal Skinner had pt. On . Someone routed this to me but I was on vacation . He has appt with vaslow 12/30 and dr Janese Banks 12/28

## 2021-03-06 ENCOUNTER — Inpatient Hospital Stay (HOSPITAL_BASED_OUTPATIENT_CLINIC_OR_DEPARTMENT_OTHER): Payer: 59 | Admitting: Oncology

## 2021-03-06 ENCOUNTER — Encounter: Payer: Self-pay | Admitting: Oncology

## 2021-03-06 ENCOUNTER — Inpatient Hospital Stay: Payer: 59

## 2021-03-06 ENCOUNTER — Other Ambulatory Visit: Payer: Self-pay

## 2021-03-06 VITALS — BP 118/68 | HR 80 | Temp 98.0°F | Resp 16 | Ht 69.0 in | Wt 144.4 lb

## 2021-03-06 DIAGNOSIS — Z5112 Encounter for antineoplastic immunotherapy: Secondary | ICD-10-CM

## 2021-03-06 DIAGNOSIS — C220 Liver cell carcinoma: Secondary | ICD-10-CM

## 2021-03-06 LAB — CBC WITH DIFFERENTIAL/PLATELET
Abs Immature Granulocytes: 0.02 10*3/uL (ref 0.00–0.07)
Basophils Absolute: 0.1 10*3/uL (ref 0.0–0.1)
Basophils Relative: 1 %
Eosinophils Absolute: 0.1 10*3/uL (ref 0.0–0.5)
Eosinophils Relative: 2 %
HCT: 34.5 % — ABNORMAL LOW (ref 39.0–52.0)
Hemoglobin: 10.3 g/dL — ABNORMAL LOW (ref 13.0–17.0)
Immature Granulocytes: 0 %
Lymphocytes Relative: 18 %
Lymphs Abs: 1 10*3/uL (ref 0.7–4.0)
MCH: 30.1 pg (ref 26.0–34.0)
MCHC: 29.9 g/dL — ABNORMAL LOW (ref 30.0–36.0)
MCV: 100.9 fL — ABNORMAL HIGH (ref 80.0–100.0)
Monocytes Absolute: 0.5 10*3/uL (ref 0.1–1.0)
Monocytes Relative: 8 %
Neutro Abs: 3.9 10*3/uL (ref 1.7–7.7)
Neutrophils Relative %: 71 %
Platelets: 129 10*3/uL — ABNORMAL LOW (ref 150–400)
RBC: 3.42 MIL/uL — ABNORMAL LOW (ref 4.22–5.81)
RDW: 20.1 % — ABNORMAL HIGH (ref 11.5–15.5)
WBC: 5.5 10*3/uL (ref 4.0–10.5)
nRBC: 0 % (ref 0.0–0.2)

## 2021-03-06 LAB — COMPREHENSIVE METABOLIC PANEL
ALT: 54 U/L — ABNORMAL HIGH (ref 0–44)
AST: 64 U/L — ABNORMAL HIGH (ref 15–41)
Albumin: 3.2 g/dL — ABNORMAL LOW (ref 3.5–5.0)
Alkaline Phosphatase: 351 U/L — ABNORMAL HIGH (ref 38–126)
Anion gap: 7 (ref 5–15)
BUN: 13 mg/dL (ref 8–23)
CO2: 27 mmol/L (ref 22–32)
Calcium: 9.1 mg/dL (ref 8.9–10.3)
Chloride: 102 mmol/L (ref 98–111)
Creatinine, Ser: 0.93 mg/dL (ref 0.61–1.24)
GFR, Estimated: >60 mL/min (ref >60)
Glucose, Bld: 106 mg/dL — ABNORMAL HIGH (ref 70–99)
Potassium: 4.3 mmol/L (ref 3.5–5.1)
Sodium: 136 mmol/L (ref 135–145)
Total Bilirubin: 1.3 mg/dL — ABNORMAL HIGH (ref 0.3–1.2)
Total Protein: 7.6 g/dL (ref 6.5–8.1)

## 2021-03-06 NOTE — Progress Notes (Signed)
Pt feeling better but not back to his self. He has no appetite and loose stools and he is on lactolose tid.

## 2021-03-06 NOTE — Progress Notes (Signed)
Hematology/Oncology Consult note [facility]  Telephone:([phone] [phone]  Patient Care Team: Venita Lick, NP as PCP - General (Nurse Practitioner) Clent Jacks, RN as Oncology Nurse Navigator Sindy Guadeloupe, MD as Consulting Physician (Hematology and Oncology)   Name of the patient: Maxwell Edwards  [id number]  [birth year]   Date of visit: 03/06/21  Diagnosis- locally advanced unresectable hepatocellular carcinoma  Chief complaint/ Reason for visit-post hospital discharge follow-up and on treatment assessment prior to cycle 14 of a Avastin and Tecentriq  Heme/Onc history:  patient is a 65 year old male with a past medical history significant for hypertension hyperlipidemia, 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  Reports that he has lost about 20 pounds over the last 2 to 3 months. He is a chronic smoker which prompted a CT chest.  CT chest without contrast on 04/06/2020 showed no evidence of mediastinal or hilar adenopathy.  He was found to have 4 subcentimeter lung nodules 3 in the right lobe and one in the left upper lobe which were borderline for metastatic disease.  Cirrhotic liver morphology with 8.3 x 7 cm ill-defined hypoattenuating lesion in the left liver with a 3.3 cm lesion in the dome of the liver near the junction of the right and left hepatic lobes.  Labs showed elevated AST and ALT of 62 and 47 respectively with a normal albumin and normal total bilirubin.  CMP showed elevated H&H of 18.3/53.8.  Hepatitis C and HIV testing was negative. 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  Another mass in the segment 4A measuring 3.4 x 2.9 cm.  No evidence of local regional  adenopathy.   Alpha-fetoprotein elevated at 38,000.  CA 19-9 elevated at 147.Patient's case discussed at tumor board and consensus was that this would be consistent with [facility] especially given the AFP value.  Initially MRI was not characteristic for [facility] but a biopsy was not deemed necessary after AFP results were back   Avastin Tecentriq started in March 2022.  He is also getting weekly phlebotomy for iron overload  Interval history-patient feels quite fatigued since his hospital stay when he was diagnosed with upper GI bleed from esophageal varices requiring banding.  Hemoglobin was down to 4.7 which improved to 8.6 after discharge.  Appetite has been poor. ECOG PS- 1 Pain scale- 0   Review of systems- Review of Systems  Constitutional:  Positive for malaise/fatigue and weight loss. Negative for chills and fever.  HENT:  Negative for congestion, ear discharge and nosebleeds.   Eyes:  Negative for blurred vision.  Respiratory:  Negative for cough, hemoptysis, sputum production, shortness of breath and wheezing.   Cardiovascular:  Negative for chest pain, palpitations, orthopnea and claudication.  Gastrointestinal:  Negative for abdominal pain, blood in stool, constipation, diarrhea, heartburn, melena, nausea and vomiting.  Genitourinary:  Negative for dysuria, flank pain, frequency, hematuria and urgency.  Musculoskeletal:  Negative for back pain, joint pain and myalgias.  Skin:  Negative for rash.  Neurological:  Negative for dizziness, tingling, focal weakness, seizures, weakness and headaches.  Endo/Heme/Allergies:  Does not bruise/bleed easily.  Psychiatric/Behavioral:  Negative for depression and suicidal ideas. The patient does not have insomnia.  No Known Allergies   Past Medical History:  Diagnosis Date   Barrett esophagus    CAD (coronary artery disease)    Cancer (HCC)    liver cancer   Cirrhosis ([place])    Heart disease    Hereditary hemochromatosis ([place])      Past  Surgical History:  Procedure Laterality Date   CORONARY ARTERY BYPASS GRAFT     at 65 years old   ESOPHAGOGASTRODUODENOSCOPY (EGD) WITH PROPOFOL N/A 05/21/2020   Procedure: ESOPHAGOGASTRODUODENOSCOPY (EGD) WITH PROPOFOL;  Surgeon: Lin Landsman, MD;  Location: [facility];  Service: Gastroenterology;  Laterality: N/A;   ESOPHAGOGASTRODUODENOSCOPY (EGD) WITH PROPOFOL N/A 02/24/2021   Procedure: ESOPHAGOGASTRODUODENOSCOPY (EGD) WITH PROPOFOL;  Surgeon: Annamaria Helling, DO;  Location: [facility] ENDOSCOPY;  Service: Gastroenterology;  Laterality: N/A;   LAPAROSCOPIC LYSIS OF ADHESIONS  06/12/2020   Procedure: LAPAROSCOPIC LYSIS OF ADHESIONS;  Surgeon: Jules Husbands, MD;  Location: [facility] ORS;  Service: General;;   PORTA CATH INSERTION N/A 04/30/2020   Procedure: PORTA CATH INSERTION;  Surgeon: Algernon Huxley, MD;  Location: [facility];  Service: Cardiovascular;  Laterality: N/A;   VEIN BYPASS SURGERY      Social History   Socioeconomic History   Marital status: Divorced    Spouse name: Not on file   Number of children: 2   Years of education: Not on file   Highest education level: Not on file  Occupational History   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[employer] Use   Smoking status: Some Days    Packs/day: 0.50    Types: Cigarettes   Smokeless tobacco: Never  Vaping Use   Vaping Use: Never used  Substance and Sexual Activity   Alcohol use: Not Currently   Drug use: Not Currently   Sexual activity: Not Currently  Other Topics Concern   Not on file  Social History Narrative   Lives by himself    Social Determinants of Health   Financial Resource Strain: Not on file  Food Insecurity: Not on file  Transportation Needs: Not on file  Physical Activity: Not on file  Stress: Not on file  Social Connections: Not on file  Intimate Partner Violence: Not on file    Family History  Problem Relation Age of Onset   Dementia Mother    Ulcers Father     Heart attack Brother    Throat cancer Brother    Heart attack Daughter    Diabetes Daughter    Depression Daughter      Current Outpatient Medications:    lactulose (CHRONULAC) 10 GM/15ML solution, Take 30 mLs (20 g total) by mouth 3 (three) times daily., Disp: 2700 mL, Rfl: 0   pantoprazole (PROTONIX) 40 MG tablet, Take 1 tablet (40 mg total) by mouth 2 (two) times daily., Disp: 60 tablet, Rfl: 0  Physical exam:  Vitals:   03/06/21 0834  BP: 118/68  Pulse: 80  Resp: 16  Temp: 98 F (36.7 C)  TempSrc: Oral  Weight: 144 lb 6.4 oz (65.5 kg)  Height: 5\' 9"  (1.753 m)   Physical Exam Constitutional:      Comments: Appears fatigued  Cardiovascular:     Rate and Rhythm: Normal rate and regular rhythm.     Heart sounds: Normal heart sounds.  Pulmonary:     Effort: Pulmonary effort is normal.     Breath sounds: Normal breath sounds.  Abdominal:     General: Bowel sounds are normal.  Palpations: Abdomen is soft.  Skin:    General: Skin is warm and dry.  Neurological:     Mental Status: He is alert and oriented to person, place, and time.     CMP Latest Ref Rng & Units 02/28/2021  Glucose 70 - 99 mg/dL 113(H)  BUN 8 - 23 mg/dL 16  Creatinine 0.61 - 1.24 mg/dL 0.96  Sodium 135 - 145 mmol/L 134(L)  Potassium 3.5 - 5.1 mmol/L 3.9  Chloride 98 - 111 mmol/L 101  CO2 22 - 32 mmol/L 24  Calcium 8.9 - 10.3 mg/dL 8.0(L)  Total Protein 6.5 - 8.1 g/dL 6.3(L)  Total Bilirubin 0.3 - 1.2 mg/dL 2.6(H)  Alkaline Phos 38 - 126 U/L 211(H)  AST 15 - 41 U/L 44(H)  ALT 0 - 44 U/L 31   CBC Latest Ref Rng & Units 02/28/2021  WBC 4.0 - 10.5 K/uL 8.1  Hemoglobin 13.0 - 17.0 g/dL 8.5(L)  Hematocrit 39.0 - 52.0 % 28.1(L)  Platelets 150 - 400 K/uL 120(L)    No images are attached to the encounter.  CT Angio Abd/Pel W and/or Wo Contrast  Result Date: 02/23/2021 CLINICAL DATA:  Lower GI bleeding. History of hepatocellular carcinoma. EXAM: CTA ABDOMEN AND PELVIS WITHOUT AND WITH  CONTRAST TECHNIQUE: Multidetector CT imaging of the abdomen and pelvis was performed using the standard protocol during bolus administration of intravenous contrast. Multiplanar reconstructed images and MIPs were obtained and reviewed to evaluate the vascular anatomy. CONTRAST:  150mL OMNIPAQUE IOHEXOL 350 MG/ML SOLN COMPARISON:  CT the chest, abdomen pelvis-01/28/2021; 10/09/2020 FINDINGS: VASCULAR Aorta: There is a large amount of irregular calcified and noncalcified atherosclerotic plaque throughout the abdominal aorta approaching though not definitively resulting in a hemodynamically significant stenosis. No evidence of abdominal aortic dissection or perivascular stranding. Celiac: Mixed calcified and noncalcified atherosclerotic plaque involves the origin the celiac artery, not resulting in hemodynamically significant stenosis. Conventional branching pattern. SMA: Mixed calcified and noncalcified atherosclerotic plaque involves the origin, proximal aspect and main trunk of the SMA resulting in tandem areas of at least 50% luminal narrowing (representative axial images 74 and 78, series 5). Conventional branching pattern. The distal tributaries of the SMA appear widely patent without discrete lumen filling defect to suggest distal embolism. Renals: Solitary bilaterally; there is a moderate to large amount of eccentric irregular mixed calcified and noncalcified atherosclerotic plaque involving the origin of the right renal artery resulting in suspected severe narrowing (coronal image 72, series 9). There is a large amount of mixed calcified and noncalcified atherosclerotic plaque throughout the left renal artery resulting in tandem areas of at least 50% luminal narrowing (representative coronal image 78, series 9). These findings are without associated asymmetric renal atrophy or delayed renal enhancement. No vessel irregularity to suggest FMD. IMA: Diseased at its origin though remains patent. Inflow: Post  stenting of the left common iliac artery with suspected hemodynamically significant narrowing at its distal aspect (coronal image 79, series 9). Eccentric calcified and noncalcified atherosclerotic plaque results in tandem areas of approximately 50% luminal narrowing involving the left external iliac artery. Moderate amount of mixed calcified and noncalcified atherosclerotic plaque results in tandem areas of at least 50% luminal narrowing throughout the right common and external iliac arteries. Proximal Outflow: Suspected hemodynamically significant narrowings involving the bilateral common and imaged portions of the bilateral superficial femoral arteries. Veins: The IVC and pelvic venous systems appear widely patent. Review of the MIP images confirms the above findings. _________________________________________________________ NON-VASCULAR Lower chest: Limited visualization of the lower  thorax demonstrates residual ill-defined ground-glass opacities within the bilateral lung bases, improved compared to the 01/28/2021 examination. Minimal nodular atelectasis within the left costophrenic angle. No discrete focal airspace opacities. No pleural effusion. Normal heart size. Diffuse decreased attenuation of the intra cardiac blood pool suggestive of anemia. Hepatobiliary: Nodularity of the hepatic contour compatible with known history of cirrhosis. Ill-defined hypoattenuating mass within the left lobe of the liver is grossly unchanged compared to recent examination performed 01/28/2021 measuring at least 7.8 x 5.3 by 6.0 cm (axial image 17, series 6; coronal image 36, series 9, as is a satellite lesion within the dome of the right lobe of the liver measuring 2.4 cm (image 10, series 6). No new discrete hepatic lesions. Normal appearance of the gallbladder given degree distention. No radiopaque gallstones. No intra or extrahepatic biliary duct dilatation. 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 No ascites. Pancreas: Normal caliber of the abdominal aorta. Spleen: Redemonstrated splenomegaly with the spleen measuring 15 cm in length (image 25, series 6). Adrenals/Urinary Tract: There is symmetric enhancement of the bilateral kidneys. No evidence of nephrolithiasis. No discrete renal lesions. No urine obstruction or perinephric stranding. Normal appearance of the bilateral adrenal glands. Normal appearance of the urinary bladder given degree of distention. Stomach/Bowel: Large colonic stool burden, particularly within the rectal vault. There is apparent ill-defined stranding seen adjacent to the hepatic flexure of the colon within the right upper abdominal quadrant (coronal image 55, series 14; axial image 43, series 6) otherwise, no discrete areas of bowel wall thickening. Normal appearance of the terminal ileum. The appendix is not visualized, however there is no pericecal inflammatory change. Small hiatal hernia with suspected small distal esophageal varices. No discrete areas of intraluminal contrast extravasation to suggest reported history of GI bleeding. Lymphatic: Redemonstrated mildly prominent though non pathologically enlarged porta hepatis and gastrohepatic ligament lymph nodes, recent unchanged compared to recent staging CT scan with index gastrohepatic lymph node measuring 0.8 cm in greatest short axis diameter (image 25, series 6). No bulky retroperitoneal, mesenteric, pelvic or inguinal lymphadenopathy. Reproductive: Prostatomegaly with mass effect on the undersurface of the urinary bladder. There is a small amount of free fluid in the pelvic cul-de-sac. Other: Regional soft tissues appear normal. Musculoskeletal: No acute or aggressive osseous abnormalities. 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 Mild degenerative change of the bilateral hips with joint space loss, subchondral sclerosis and osteophytosis. IMPRESSION: NON-VASCULAR 1. No discrete areas of  intraluminal contrast extravasation to suggest the etiology of reported history of GI bleeding. 2. Suspected minimal amount of stranding adjacent to the hepatic flexure of the colon, the etiology of which is not depicted on this examination. Specifically, no evidence of intraluminal contrast extravasation, enteric obstruction or perforation/definable/drainable fluid collection. 3. 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 4. Suspected distal esophageal varices, again without intraluminal contrast extravasation. Further evaluation with endoscopy could be performed as indicated. VASCULAR 1. Large amount of atherosclerotic plaque throughout the normal caliber abdominal aorta approaching, though not definitively resulting in a hemodynamically significant stenosis. No evidence of abdominal aortic dissection or perivascular stranding. Aortic Atherosclerosis (ICD10-I70.0). 2. Suspected hemodynamically significant narrowing involving the origin and proximal aspects of the SMA however both the celiac and IMA remain patent. 3. Suspected hemodynamically significant narrowings involving the bilateral renal arteries though without associated delayed renal  atrophy or asymmetric renal enhancement. 4. Post stenting of the left common iliac artery with suspected hemodynamically significant narrowing at the distal landing zone of the stent. Suspected hemodynamically significant narrowings involving the left external iliac artery, the left common and imaged portions of the left superficial femoral arteries. Correlation for symptoms of left lower extremity PAD is advised. 5. Suspected hemodynamically significant narrowings involving the right common and external iliac arteries, the right common femoral and imaged portions of the right superficial femoral  arteries. Correlation for symptoms of right lower extremity PAD is advised. Electronically Signed   By: Sandi Mariscal M.D.   On: 02/23/2021 15:26     Assessment and plan- Patient is a 65 y.o. male with locally advanced hepatocellular carcinoma here for post hospital discharge follow-up  Upper GI bleed: Secondary to grade 2 esophageal varices s/p banding.  He will be having a repeat endoscopy with Dr. Virgina Jock in a couple of weeks.  We will repeat CBC today.  His hemoglobin had gone up from 4.7-8.5 at discharge.  Patient presently denies any evidence of hematemesis or melena.  Cirrhosis: He is having some loose bowel movements and have asked him to cut down his lactulose to once a day.  Bilirubin was also elevated at 4.3 upon discharge.  A month ago prior to his hospitalization his bilirubin was normal.  Repeat CMP today.  Hemochromatosis and iron overload: Phlebotomy will be on hold given his episode of massive GI bleed.  Locally advanced hepatocellular carcinoma: He was supposed to receive treatment last week.  I will hold off on any treatment this week and reevaluate him next week.  We will hold off on giving him any a Avastin given his recent GI bleed.  We will proceed with Tecentriq alone next week and check a TSH at that time as well.   Visit Diagnosis 1. Encounter for antineoplastic immunotherapy   2. Cancer, hepatocellular ([place])      Dr. Randa Evens, MD, MPH [facility] [phone] 03/06/2021 8:32 AM

## 2021-03-07 ENCOUNTER — Encounter: Payer: Self-pay | Admitting: Oncology

## 2021-03-08 ENCOUNTER — Other Ambulatory Visit: Payer: Self-pay

## 2021-03-08 ENCOUNTER — Inpatient Hospital Stay: Payer: 59

## 2021-03-08 ENCOUNTER — Inpatient Hospital Stay (HOSPITAL_BASED_OUTPATIENT_CLINIC_OR_DEPARTMENT_OTHER): Payer: 59 | Admitting: Internal Medicine

## 2021-03-08 DIAGNOSIS — G20 Parkinson's disease: Secondary | ICD-10-CM | POA: Diagnosis not present

## 2021-03-08 DIAGNOSIS — G20C Parkinsonism, unspecified: Secondary | ICD-10-CM

## 2021-03-08 NOTE — Progress Notes (Signed)
I connected with Maxwell Edwards on 03/08/21 at  9:00 AM EST by telephone visit and verified that I am speaking with the correct person using two identifiers.  I discussed the limitations, risks, security and privacy concerns of performing an evaluation and management service by telemedicine and the availability of in-person appointments. I also discussed with the patient that there may be a patient responsible charge related to this service. The patient expressed understanding and agreed to proceed.  Other persons participating in the visit and their role in the encounter:  family  Patient's location:  Home  Provider's location:  Office  Chief Complaint:  Parkinsonism, unspecified Parkinsonism type ([place])  History of Present Ilness: Maxwell Edwards feels improved since his hospitalization.  He remains very tired and lethargic, however.  He stopped his ropinirole medication because of nausea, imbalance.  He understands in hindsight this could be attributed to his anemia and bleed, but the tremors are somewhat improved in recent days.  Observations: Language and cognition at baseline  Assessment and Plan: Parkinsonism, unspecified Parkinsonism type ([place])  Patient would prefer medication holiday given other medical issues, apparent poor tolerance of dopamine agonist.  We are agreeable with this, happy to revisit with him as needed.  Follow Up Instructions: RTC as needed   I discussed the assessment and treatment plan with the patient.  The patient was provided an opportunity to ask questions and all were answered.  The patient agreed with the plan and demonstrated understanding of the instructions.    The patient was advised to call back or seek an in-person evaluation if the symptoms worsen or if the condition fails to improve as anticipated.  I provided 5-10 minutes of non-face-to-face time during this enocunter.  Ventura Sellers, MD   I provided 22 minutes of non face-to-face telephone visit  time during this encounter, and > 50% was spent counseling as documented under my assessment & plan.

## 2021-03-10 ENCOUNTER — Encounter: Payer: Self-pay | Admitting: Oncology

## 2021-03-11 ENCOUNTER — Encounter: Payer: Self-pay | Admitting: Oncology

## 2021-03-11 NOTE — Progress Notes (Signed)
This encounter was created in error - please disregard.

## 2021-03-15 ENCOUNTER — Inpatient Hospital Stay: Payer: Medicare Other | Attending: Internal Medicine

## 2021-03-15 ENCOUNTER — Inpatient Hospital Stay: Payer: Medicare Other

## 2021-03-15 ENCOUNTER — Other Ambulatory Visit: Payer: Self-pay

## 2021-03-15 ENCOUNTER — Encounter: Payer: Self-pay | Admitting: Oncology

## 2021-03-15 ENCOUNTER — Inpatient Hospital Stay (HOSPITAL_BASED_OUTPATIENT_CLINIC_OR_DEPARTMENT_OTHER): Payer: Medicare Other | Admitting: Oncology

## 2021-03-15 VITALS — BP 122/70 | HR 73 | Temp 97.7°F | Resp 16 | Wt 141.2 lb

## 2021-03-15 DIAGNOSIS — C220 Liver cell carcinoma: Secondary | ICD-10-CM

## 2021-03-15 DIAGNOSIS — Z5112 Encounter for antineoplastic immunotherapy: Secondary | ICD-10-CM | POA: Diagnosis not present

## 2021-03-15 DIAGNOSIS — Z79899 Other long term (current) drug therapy: Secondary | ICD-10-CM | POA: Diagnosis not present

## 2021-03-15 LAB — CBC WITH DIFFERENTIAL/PLATELET
Abs Immature Granulocytes: 0.01 10*3/uL (ref 0.00–0.07)
Basophils Absolute: 0.1 10*3/uL (ref 0.0–0.1)
Basophils Relative: 1 %
Eosinophils Absolute: 0.1 10*3/uL (ref 0.0–0.5)
Eosinophils Relative: 2 %
HCT: 33.6 % — ABNORMAL LOW (ref 39.0–52.0)
Hemoglobin: 10.4 g/dL — ABNORMAL LOW (ref 13.0–17.0)
Immature Granulocytes: 0 %
Lymphocytes Relative: 21 %
Lymphs Abs: 0.9 10*3/uL (ref 0.7–4.0)
MCH: 30.2 pg (ref 26.0–34.0)
MCHC: 31 g/dL (ref 30.0–36.0)
MCV: 97.7 fL (ref 80.0–100.0)
Monocytes Absolute: 0.5 10*3/uL (ref 0.1–1.0)
Monocytes Relative: 10 %
Neutro Abs: 3 10*3/uL (ref 1.7–7.7)
Neutrophils Relative %: 66 %
Platelets: 112 10*3/uL — ABNORMAL LOW (ref 150–400)
RBC: 3.44 MIL/uL — ABNORMAL LOW (ref 4.22–5.81)
RDW: 18.4 % — ABNORMAL HIGH (ref 11.5–15.5)
WBC: 4.5 10*3/uL (ref 4.0–10.5)
nRBC: 0 % (ref 0.0–0.2)

## 2021-03-15 LAB — COMPREHENSIVE METABOLIC PANEL
ALT: 46 U/L — ABNORMAL HIGH (ref 0–44)
AST: 49 U/L — ABNORMAL HIGH (ref 15–41)
Albumin: 3.1 g/dL — ABNORMAL LOW (ref 3.5–5.0)
Alkaline Phosphatase: 329 U/L — ABNORMAL HIGH (ref 38–126)
Anion gap: 5 (ref 5–15)
BUN: 18 mg/dL (ref 8–23)
CO2: 25 mmol/L (ref 22–32)
Calcium: 8.6 mg/dL — ABNORMAL LOW (ref 8.9–10.3)
Chloride: 103 mmol/L (ref 98–111)
Creatinine, Ser: 0.87 mg/dL (ref 0.61–1.24)
GFR, Estimated: >60 mL/min (ref >60)
Glucose, Bld: 163 mg/dL — ABNORMAL HIGH (ref 70–99)
Potassium: 3.8 mmol/L (ref 3.5–5.1)
Sodium: 133 mmol/L — ABNORMAL LOW (ref 135–145)
Total Bilirubin: 0.6 mg/dL (ref 0.3–1.2)
Total Protein: 7.6 g/dL (ref 6.5–8.1)

## 2021-03-15 MED ORDER — SODIUM CHLORIDE 0.9 % IV SOLN
Freq: Once | INTRAVENOUS | Status: AC
  Filled 2021-03-15: qty 250

## 2021-03-15 MED ORDER — HEPARIN SOD (PORK) LOCK FLUSH 100 UNIT/ML IV SOLN
500.0000 [IU] | Freq: Once | INTRAVENOUS | Status: AC | PRN
  Administered 2021-03-15: 500 [IU]
  Filled 2021-03-15: qty 5

## 2021-03-15 MED ORDER — SODIUM CHLORIDE 0.9 % IV SOLN
1200.0000 mg | Freq: Once | INTRAVENOUS | Status: AC
  Administered 2021-03-15: 1200 mg via INTRAVENOUS
  Filled 2021-03-15: qty 20

## 2021-03-15 NOTE — Patient Instructions (Signed)
[facility] AT [place]  Discharge Instructions: Thank you for choosing [facility] to provide your oncology and hematology care.  If you have a lab appointment with the [facility], please go directly to the [facility] and check in at the registration area.  Wear comfortable clothing and clothing appropriate for easy access to any Portacath or PICC line.   We strive to give you quality time with your provider. You may need to reschedule your appointment if you arrive late (15 or more minutes).  Arriving late affects you and other patients whose appointments are after yours.  Also, if you miss three or more appointments without notifying the office, you may be dismissed from the clinic at the providers discretion.      For prescription refill requests, have your pharmacy contact our office and allow 72 hours for refills to be completed.    Today you received the following chemotherapy and/or immunotherapy agents: Tecentiq      To help prevent nausea and vomiting after your treatment, we encourage you to take your nausea medication as directed.  BELOW ARE SYMPTOMS THAT SHOULD BE REPORTED IMMEDIATELY: *FEVER GREATER THAN 100.4 F (38 C) OR HIGHER *CHILLS OR SWEATING *NAUSEA AND VOMITING THAT IS NOT CONTROLLED WITH YOUR NAUSEA MEDICATION *UNUSUAL SHORTNESS OF BREATH *UNUSUAL BRUISING OR BLEEDING *URINARY PROBLEMS (pain or burning when urinating, or frequent urination) *BOWEL PROBLEMS (unusual diarrhea, constipation, pain near the anus) TENDERNESS IN MOUTH AND THROAT WITH OR WITHOUT PRESENCE OF ULCERS (sore throat, sores in mouth, or a toothache) UNUSUAL RASH, SWELLING OR PAIN  UNUSUAL VAGINAL DISCHARGE OR ITCHING   Items with * indicate a potential emergency and should be followed up as soon as possible or go to the Emergency Department if any problems should occur.  Please show the CHEMOTHERAPY ALERT CARD or IMMUNOTHERAPY ALERT CARD at check-in to  the Emergency Department and triage nurse.  Should you have questions after your visit or need to cancel or reschedule your appointment, please contact [facility]CER [place] AT [place]  [phone] and follow the prompts.  Office hours are 8:00 a.m. to 4:30 p.m. Monday - Friday. Please note that voicemails left after 4:00 p.m. may not be returned until the following business day.  We are closed weekends and major holidays. You have access to a nurse at all times for urgent questions. Please call the main number to the clinic [phone] and follow the prompts.  For any non-urgent questions, you may also contact your provider using MyChart. We now offer e-Visits for anyone 43 and older to request care online for non-urgent symptoms. For details visit [email or web address].   Also download the MyChart app! Go to the app store, search "MyChart", open the app, select [facility], and log in with your MyChart username and password.  Due to Covid, a mask is required upon entering the hospital/clinic. If you do not have a mask, one will be given to you upon arrival. For doctor visits, patients may have 1 support person aged 59 or older with them. For treatment visits, patients cannot have anyone with them due to current Covid guidelines and our immunocompromised population. Atezolizumab injection What is this medication? ATEZOLIZUMAB (a te zoe LIZ ue mab) is a monoclonal antibody. It is used to treat bladder cancer (urothelial cancer), liver cancer, lung cancer, and melanoma. This medicine may be used for other purposes; ask your health care provider or pharmacist if you have questions. COMMON BRAND NAME(S): Tecentriq What should  I tell my care team before I take this medication? They need to know if you have any of these conditions: autoimmune diseases like Crohn's disease, ulcerative colitis, or lupus have had or planning to have an allogeneic stem cell transplant (uses someone else's stem  cells) history of organ transplant history of radiation to the chest nervous system problems like myasthenia gravis or Guillain-Barre syndrome an unusual or allergic reaction to atezolizumab, other medicines, foods, dyes, or preservatives pregnant or trying to get pregnant breast-feeding How should I use this medication? This medicine is for infusion into a vein. It is given by a health care professional in a hospital or clinic setting. A special MedGuide will be given to you before each treatment. Be sure to read this information carefully each time. Talk to your pediatrician regarding the use of this medicine in children. Special care may be needed. Overdosage: If you think you have taken too much of this medicine contact a poison control center or emergency room at once. NOTE: This medicine is only for you. Do not share this medicine with others. What if I miss a dose? It is important not to miss your dose. Call your doctor or health care professional if you are unable to keep an appointment. What may interact with this medication? Interactions have not been studied. This list may not describe all possible interactions. Give your health care provider a list of all the medicines, herbs, non-prescription drugs, or dietary supplements you use. Also tell them if you smoke, drink alcohol, or use illegal drugs. Some items may interact with your medicine. What should I watch for while using this medication? Your condition will be monitored carefully while you are receiving this medicine. You may need blood work done while you are taking this medicine. Do not become pregnant while taking this medicine or for at least 5 months after stopping it. Women should inform their doctor if they wish to become pregnant or think they might be pregnant. There is a potential for serious side effects to an unborn child. Talk to your health care professional or pharmacist for more information. Do not breast-feed an  infant while taking this medicine or for at least 5 months after the last dose. What side effects may I notice from receiving this medication? Side effects that you should report to your doctor or health care professional as soon as possible: allergic reactions like skin rash, itching or hives, swelling of the face, lips, or tongue black, tarry stools bloody or watery diarrhea breathing problems changes in vision chest pain or chest tightness chills facial flushing fever headache signs and symptoms of high blood sugar such as dizziness; dry mouth; dry skin; fruity breath; nausea; stomach pain; increased hunger or thirst; increased urination signs and symptoms of liver injury like dark yellow or brown urine; general ill feeling or flu-like symptoms; light-colored stools; loss of appetite; nausea; right upper belly pain; unusually weak or tired; yellowing of the eyes or skin stomach pain trouble passing urine or change in the amount of urine Side effects that usually do not require medical attention (report to your doctor or health care professional if they continue or are bothersome): bone pain cough diarrhea joint pain muscle pain muscle weakness swelling of arms or legs tiredness weight loss This list may not describe all possible side effects. Call your doctor for medical advice about side effects. You may report side effects to FDA at 1-800-FDA-1088. Where should I keep my medication? This drug is given  in a hospital or clinic and will not be stored at home. NOTE: This sheet is a summary. It may not cover all possible information. If you have questions about this medicine, talk to your doctor, pharmacist, or health care provider.  2022 [employer] Standard (2020-11-13 00:00:00)

## 2021-03-15 NOTE — Progress Notes (Signed)
Hematology/Oncology Consult note [facility]  [phone] [phone]  Patient Care Team: Venita Lick, NP as PCP - General (Nurse Practitioner) Clent Jacks, RN as Oncology Nurse Navigator Sindy Guadeloupe, MD as Consulting Physician (Hematology and Oncology)   Name of the patient: Maxwell Edwards  [id number]  1955-11-21   Date of visit: 03/15/21  Diagnosis- locally advanced unresectable hepatocellular carcinoma    Chief complaint/ Reason for visit-on treatment assessment prior to next cycle of Tecentriq  Heme/Onc history:  patient is a 66 year old male with a past medical history significant for hypertension hyperlipidemia, 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  Reports that he has lost about 20 pounds over the last 2 to 3 months. He is a chronic smoker which prompted a CT chest.  CT chest without contrast on 04/06/2020 showed no evidence of mediastinal or hilar adenopathy.  He was found to have 4 subcentimeter lung nodules 3 in the right lobe and one in the left upper lobe which were borderline for metastatic disease.  Cirrhotic liver morphology with 8.3 x 7 cm ill-defined hypoattenuating lesion in the left liver with a 3.3 cm lesion in the dome of the liver near the junction of the right and left hepatic lobes.  Labs showed elevated AST and ALT of 62 and 47 respectively with a normal albumin and normal total bilirubin.  CMP showed elevated H&H of 18.3/53.8.  Hepatitis C and HIV testing was negative. 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  Another mass in the segment 4A measuring 3.4 x 2.9 cm.  No evidence of local regional adenopathy.   Alpha-fetoprotein elevated at 38,000.  CA  19-9 elevated at 147.Patient's case discussed at tumor board and consensus was that this would be consistent with [facility] especially given the AFP value.  Initially MRI was not characteristic for [facility] but a biopsy was not deemed necessary after AFP results were back   Avastin Tecentriq started in March 2022.  He is also getting weekly phlebotomy for iron overload  Patient developed significant variceal bleeding in December 2022 and Avastin on hold since then    Interval history-currently patient reports doing well and denies any specific complaints at this time other than mild chronic fatigue.  Tremors have improved.  He could not tolerate ropinirole  ECOG PS- 1 Pain scale- 0   Review of systems- Review of Systems  Constitutional:  Positive for malaise/fatigue. Negative for chills, fever and weight loss.  HENT:  Negative for congestion, ear discharge and nosebleeds.   Eyes:  Negative for blurred vision.  Respiratory:  Negative for cough, hemoptysis, sputum production, shortness of breath and wheezing.   Cardiovascular:  Negative for chest pain, palpitations, orthopnea and claudication.  Gastrointestinal:  Negative for abdominal pain, blood in stool, constipation, diarrhea, heartburn, melena, nausea and vomiting.  Genitourinary:  Negative for dysuria, flank pain, frequency, hematuria and urgency.  Musculoskeletal:  Negative for back pain, joint pain and myalgias.  Skin:  Negative for rash.  Neurological:  Negative for dizziness, tingling, focal weakness, seizures, weakness and headaches.  Endo/Heme/Allergies:  Does not bruise/bleed easily.  Psychiatric/Behavioral:  Negative for depression and suicidal ideas. The patient does not have insomnia.  No Known Allergies   Past Medical History:  Diagnosis Date   Barrett esophagus    CAD (coronary artery disease)    Cancer (HCC)    liver cancer   Cirrhosis ([place])    Heart disease    Hereditary hemochromatosis ([place])      Past Surgical  History:  Procedure Laterality Date   CORONARY ARTERY BYPASS GRAFT     at 66 years old   ESOPHAGOGASTRODUODENOSCOPY (EGD) WITH PROPOFOL N/A 05/21/2020   Procedure: ESOPHAGOGASTRODUODENOSCOPY (EGD) WITH PROPOFOL;  Surgeon: Lin Landsman, MD;  Location: [facility];  Service: Gastroenterology;  Laterality: N/A;   ESOPHAGOGASTRODUODENOSCOPY (EGD) WITH PROPOFOL N/A 02/24/2021   Procedure: ESOPHAGOGASTRODUODENOSCOPY (EGD) WITH PROPOFOL;  Surgeon: Annamaria Helling, DO;  [facility] ENDOSCOPY;  Service: Gastroenterology;  Laterality: N/A;   LAPAROSCOPIC LYSIS OF ADHESIONS  06/12/2020   Procedure: LAPAROSCOPIC LYSIS OF ADHESIONS;  Surgeon: Jules Husbands, MD;  Location: [facility] ORS;  Service: General;;   PORTA CATH INSERTION N/A 04/30/2020   Procedure: PORTA CATH INSERTION;  Surgeon: Algernon Huxley, MD;  Location: [facility];  Service: Cardiovascular;  Laterality: N/A;   VEIN BYPASS SURGERY      Social History   Socioeconomic History   Marital status: Divorced    Spouse name: Not on file   Number of children: 2   Years of education: Not on file   Highest education level: Not on file  Occupational History   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[employer] Use   Smoking status: Former   Smokeless tobacco: Never  Scientific laboratory technician Use: Never used  Substance and Sexual Activity   Alcohol use: Not Currently   Drug use: Not Currently   Sexual activity: Not Currently  Other Topics Concern   Not on file  Social History Narrative   Lives by himself    Social Determinants of Health   Financial Resource Strain: Not on file  Food Insecurity: Not on file  Transportation Needs: Not on file  Physical Activity: Not on file  Stress: Not on file  Social Connections: Not on file  Intimate Partner Violence: Not on file    Family History  Problem Relation Age of Onset   Dementia Mother    Ulcers Father    Heart attack Brother    Throat cancer Brother     Heart attack Daughter    Diabetes Daughter    Depression Daughter      Current Outpatient Medications:    lactulose (CHRONULAC) 10 GM/15ML solution, Take 30 mLs (20 g total) by mouth 3 (three) times daily., Disp: 2700 mL, Rfl: 0   pantoprazole (PROTONIX) 40 MG tablet, Take 1 tablet (40 mg total) by mouth 2 (two) times daily., Disp: 60 tablet, Rfl: 0  Physical exam:  Vitals:   03/15/21 1320  BP: 122/70  Pulse: 73  Resp: 16  Temp: 97.7 F (36.5 C)  TempSrc: Tympanic  SpO2: 100%  Weight: 141 lb 3.2 oz (64 kg)   Physical Exam Constitutional:      General: He is not in acute distress. Cardiovascular:     Rate and Rhythm: Normal rate and regular rhythm.     Heart sounds: Normal heart sounds.  Pulmonary:     Effort: Pulmonary effort is normal.     Breath sounds: Normal breath sounds.  Abdominal:     General: Bowel sounds are normal.     Palpations: Abdomen is soft.  Skin:  General: Skin is warm and dry.  Neurological:     Mental Status: He is alert and oriented to person, place, and time.     CMP Latest Ref Rng & Units 03/06/2021  Glucose 70 - 99 mg/dL 106(H)  BUN 8 - 23 mg/dL 13  Creatinine 0.61 - 1.24 mg/dL 0.93  Sodium 135 - 145 mmol/L 136  Potassium 3.5 - 5.1 mmol/L 4.3  Chloride 98 - 111 mmol/L 102  CO2 22 - 32 mmol/L 27  Calcium 8.9 - 10.3 mg/dL 9.1  Total Protein 6.5 - 8.1 g/dL 7.6  Total Bilirubin 0.3 - 1.2 mg/dL 1.3(H)  Alkaline Phos 38 - 126 U/L 351(H)  AST 15 - 41 U/L 64(H)  ALT 0 - 44 U/L 54(H)   CBC Latest Ref Rng & Units 03/06/2021  WBC 4.0 - 10.5 K/uL 5.5  Hemoglobin 13.0 - 17.0 g/dL 10.3(L)  Hematocrit 39.0 - 52.0 % 34.5(L)  Platelets 150 - 400 K/uL 129(L)   CT Angio Abd/Pel W and/or Wo Contrast  Result Date: 02/23/2021 CLINICAL DATA:  Lower GI bleeding. History of hepatocellular carcinoma. EXAM: CTA ABDOMEN AND PELVIS WITHOUT AND WITH CONTRAST TECHNIQUE: Multidetector CT imaging of the abdomen and pelvis was performed using the standard  protocol during bolus administration of intravenous contrast. Multiplanar reconstructed images and MIPs were obtained and reviewed to evaluate the vascular anatomy. CONTRAST:  129mL OMNIPAQUE IOHEXOL 350 MG/ML SOLN COMPARISON:  CT the chest, abdomen pelvis-01/28/2021; 10/09/2020 FINDINGS: VASCULAR Aorta: There is a large amount of irregular calcified and noncalcified atherosclerotic plaque throughout the abdominal aorta approaching though not definitively resulting in a hemodynamically significant stenosis. No evidence of abdominal aortic dissection or perivascular stranding. Celiac: Mixed calcified and noncalcified atherosclerotic plaque involves the origin the celiac artery, not resulting in hemodynamically significant stenosis. Conventional branching pattern. SMA: Mixed calcified and noncalcified atherosclerotic plaque involves the origin, proximal aspect and main trunk of the SMA resulting in tandem areas of at least 50% luminal narrowing (representative axial images 74 and 78, series 5). Conventional branching pattern. The distal tributaries of the SMA appear widely patent without discrete lumen filling defect to suggest distal embolism. Renals: Solitary bilaterally; there is a moderate to large amount of eccentric irregular mixed calcified and noncalcified atherosclerotic plaque involving the origin of the right renal artery resulting in suspected severe narrowing (coronal image 72, series 9). There is a large amount of mixed calcified and noncalcified atherosclerotic plaque throughout the left renal artery resulting in tandem areas of at least 50% luminal narrowing (representative coronal image 78, series 9). These findings are without associated asymmetric renal atrophy or delayed renal enhancement. No vessel irregularity to suggest FMD. IMA: Diseased at its origin though remains patent. Inflow: Post stenting of the left common iliac artery with suspected hemodynamically significant narrowing at its distal  aspect (coronal image 79, series 9). Eccentric calcified and noncalcified atherosclerotic plaque results in tandem areas of approximately 50% luminal narrowing involving the left external iliac artery. Moderate amount of mixed calcified and noncalcified atherosclerotic plaque results in tandem areas of at least 50% luminal narrowing throughout the right common and external iliac arteries. Proximal Outflow: Suspected hemodynamically significant narrowings involving the bilateral common and imaged portions of the bilateral superficial femoral arteries. Veins: The IVC and pelvic venous systems appear widely patent. Review of the MIP images confirms the above findings. _________________________________________________________ NON-VASCULAR Lower chest: Limited visualization of the lower thorax demonstrates residual ill-defined ground-glass opacities within the bilateral lung bases, improved compared to the 01/28/2021 examination. Minimal  nodular atelectasis within the left costophrenic angle. No discrete focal airspace opacities. No pleural effusion. Normal heart size. Diffuse decreased attenuation of the intra cardiac blood pool suggestive of anemia. Hepatobiliary: Nodularity of the hepatic contour compatible with known history of cirrhosis. Ill-defined hypoattenuating mass within the left lobe of the liver is grossly unchanged compared to recent examination performed 01/28/2021 measuring at least 7.8 x 5.3 by 6.0 cm (axial image 17, series 6; coronal image 36, series 9, as is a satellite lesion within the dome of the right lobe of the liver measuring 2.4 cm (image 10, series 6). No new discrete hepatic lesions. Normal appearance of the gallbladder given degree distention. No radiopaque gallstones. No intra or extrahepatic biliary duct dilatation. 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 No ascites. Pancreas: Normal caliber of the abdominal aorta. Spleen: Redemonstrated splenomegaly with  the spleen measuring 15 cm in length (image 25, series 6). Adrenals/Urinary Tract: There is symmetric enhancement of the bilateral kidneys. No evidence of nephrolithiasis. No discrete renal lesions. No urine obstruction or perinephric stranding. Normal appearance of the bilateral adrenal glands. Normal appearance of the urinary bladder given degree of distention. Stomach/Bowel: Large colonic stool burden, particularly within the rectal vault. There is apparent ill-defined stranding seen adjacent to the hepatic flexure of the colon within the right upper abdominal quadrant (coronal image 55, series 14; axial image 43, series 6) otherwise, no discrete areas of bowel wall thickening. Normal appearance of the terminal ileum. The appendix is not visualized, however there is no pericecal inflammatory change. Small hiatal hernia with suspected small distal esophageal varices. No discrete areas of intraluminal contrast extravasation to suggest reported history of GI bleeding. Lymphatic: Redemonstrated mildly prominent though non pathologically enlarged porta hepatis and gastrohepatic ligament lymph nodes, recent unchanged compared to recent staging CT scan with index gastrohepatic lymph node measuring 0.8 cm in greatest short axis diameter (image 25, series 6). No bulky retroperitoneal, mesenteric, pelvic or inguinal lymphadenopathy. Reproductive: Prostatomegaly with mass effect on the undersurface of the urinary bladder. There is a small amount of free fluid in the pelvic cul-de-sac. Other: Regional soft tissues appear normal. Musculoskeletal: No acute or aggressive osseous abnormalities. 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 Mild degenerative change of the bilateral hips with joint space loss, subchondral sclerosis and osteophytosis. IMPRESSION: NON-VASCULAR 1. No discrete areas of intraluminal contrast extravasation to suggest the etiology of reported history of GI bleeding. 2. Suspected  minimal amount of stranding adjacent to the hepatic flexure of the colon, the etiology of which is not depicted on this examination. Specifically, no evidence of intraluminal contrast extravasation, enteric obstruction or perforation/definable/drainable fluid collection. 3. 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 4. Suspected distal esophageal varices, again without intraluminal contrast extravasation. Further evaluation with endoscopy could be performed as indicated. VASCULAR 1. Large amount of atherosclerotic plaque throughout the normal caliber abdominal aorta approaching, though not definitively resulting in a hemodynamically significant stenosis. No evidence of abdominal aortic dissection or perivascular stranding. Aortic Atherosclerosis (ICD10-I70.0). 2. Suspected hemodynamically significant narrowing involving the origin and proximal aspects of the SMA however both the celiac and IMA remain patent. 3. Suspected hemodynamically significant narrowings involving the bilateral renal arteries though without associated delayed renal atrophy or asymmetric renal enhancement. 4. Post stenting of the left common iliac artery with suspected hemodynamically significant  narrowing at the distal landing zone of the stent. Suspected hemodynamically significant narrowings involving the left external iliac artery, the left common and imaged portions of the left superficial femoral arteries. Correlation for symptoms of left lower extremity PAD is advised. 5. Suspected hemodynamically significant narrowings involving the right common and external iliac arteries, the right common femoral and imaged portions of the right superficial femoral arteries. Correlation for symptoms of right lower extremity PAD is advised. Electronically Signed   By: Sandi Mariscal M.D.   On: 02/23/2021 15:26     Assessment and plan- Patient is a 66 y.o. male with locally advanced hepatocellular carcinoma here for on treatment assessment prior to next cycle of Tecentriq  A Avastin has been on hold since December 2022 after patient had variceal bleed.  Plan for now is to continue Tecentriq single agent treatment.  Hemochromatosis: Phlebotomy currently on hold given recent episode of significant GI bleed.  Abnormal LFTs: AST ALT and alkaline phosphatase are still elevated but total bilirubin has normalized.  Continue to monitor  I will see him back in 3 weeks with CBC with differential ferritin and iron studies CMP AFP and TSH to receive Tecentriq   Visit Diagnosis 1. Encounter for antineoplastic immunotherapy   2. Cancer, hepatocellular ([place])      Dr. Randa Evens, MD, MPH [facility] [phone] 03/15/2021 1:29 PM

## 2021-03-15 NOTE — Progress Notes (Signed)
Patient here for pretreatment check. He has no questions of complaints today.

## 2021-03-17 ENCOUNTER — Encounter: Payer: Self-pay | Admitting: Oncology

## 2021-03-22 ENCOUNTER — Ambulatory Visit: Payer: 59 | Admitting: Oncology

## 2021-03-22 ENCOUNTER — Ambulatory Visit: Payer: 59

## 2021-03-22 ENCOUNTER — Encounter: Payer: Self-pay | Admitting: Oncology

## 2021-03-22 ENCOUNTER — Other Ambulatory Visit: Payer: 59

## 2021-04-05 ENCOUNTER — Other Ambulatory Visit: Payer: Self-pay

## 2021-04-05 ENCOUNTER — Encounter: Payer: Self-pay | Admitting: Oncology

## 2021-04-05 ENCOUNTER — Encounter: Payer: Self-pay | Admitting: *Deleted

## 2021-04-05 ENCOUNTER — Inpatient Hospital Stay (HOSPITAL_BASED_OUTPATIENT_CLINIC_OR_DEPARTMENT_OTHER): Payer: Medicare Other | Admitting: Oncology

## 2021-04-05 ENCOUNTER — Telehealth: Payer: Self-pay

## 2021-04-05 ENCOUNTER — Inpatient Hospital Stay: Payer: Medicare Other

## 2021-04-05 VITALS — BP 135/76 | HR 83 | Temp 97.8°F | Resp 20 | Wt 139.1 lb

## 2021-04-05 DIAGNOSIS — C220 Liver cell carcinoma: Secondary | ICD-10-CM | POA: Diagnosis not present

## 2021-04-05 DIAGNOSIS — K7290 Hepatic failure, unspecified without coma: Secondary | ICD-10-CM

## 2021-04-05 DIAGNOSIS — Z5112 Encounter for antineoplastic immunotherapy: Secondary | ICD-10-CM

## 2021-04-05 DIAGNOSIS — R7989 Other specified abnormal findings of blood chemistry: Secondary | ICD-10-CM

## 2021-04-05 DIAGNOSIS — Z79899 Other long term (current) drug therapy: Secondary | ICD-10-CM | POA: Diagnosis not present

## 2021-04-05 LAB — COMPREHENSIVE METABOLIC PANEL
ALT: 48 U/L — ABNORMAL HIGH (ref 0–44)
AST: 67 U/L — ABNORMAL HIGH (ref 15–41)
Albumin: 3.2 g/dL — ABNORMAL LOW (ref 3.5–5.0)
Alkaline Phosphatase: 473 U/L — ABNORMAL HIGH (ref 38–126)
Anion gap: 8 (ref 5–15)
BUN: 17 mg/dL (ref 8–23)
CO2: 26 mmol/L (ref 22–32)
Calcium: 9.2 mg/dL (ref 8.9–10.3)
Chloride: 101 mmol/L (ref 98–111)
Creatinine, Ser: 0.81 mg/dL (ref 0.61–1.24)
GFR, Estimated: >60 mL/min (ref >60)
Glucose, Bld: 107 mg/dL — ABNORMAL HIGH (ref 70–99)
Potassium: 3.9 mmol/L (ref 3.5–5.1)
Sodium: 135 mmol/L (ref 135–145)
Total Bilirubin: 0.7 mg/dL (ref 0.3–1.2)
Total Protein: 8.5 g/dL — ABNORMAL HIGH (ref 6.5–8.1)

## 2021-04-05 LAB — CBC WITH DIFFERENTIAL/PLATELET
Abs Immature Granulocytes: 0.01 10*3/uL (ref 0.00–0.07)
Basophils Absolute: 0.1 10*3/uL (ref 0.0–0.1)
Basophils Relative: 1 %
Eosinophils Absolute: 0.1 10*3/uL (ref 0.0–0.5)
Eosinophils Relative: 2 %
HCT: 39.9 % (ref 39.0–52.0)
Hemoglobin: 12.6 g/dL — ABNORMAL LOW (ref 13.0–17.0)
Immature Granulocytes: 0 %
Lymphocytes Relative: 21 %
Lymphs Abs: 1.1 10*3/uL (ref 0.7–4.0)
MCH: 28.5 pg (ref 26.0–34.0)
MCHC: 31.6 g/dL (ref 30.0–36.0)
MCV: 90.3 fL (ref 80.0–100.0)
Monocytes Absolute: 0.7 10*3/uL (ref 0.1–1.0)
Monocytes Relative: 13 %
Neutro Abs: 3.2 10*3/uL (ref 1.7–7.7)
Neutrophils Relative %: 63 %
Platelets: 114 10*3/uL — ABNORMAL LOW (ref 150–400)
RBC: 4.42 MIL/uL (ref 4.22–5.81)
RDW: 17.8 % — ABNORMAL HIGH (ref 11.5–15.5)
WBC: 5.1 10*3/uL (ref 4.0–10.5)
nRBC: 0 % (ref 0.0–0.2)

## 2021-04-05 LAB — FERRITIN: Ferritin: 159 ng/mL (ref 24–336)

## 2021-04-05 LAB — TSH: TSH: 1.118 u[IU]/mL (ref 0.350–4.500)

## 2021-04-05 MED ORDER — HEPARIN SOD (PORK) LOCK FLUSH 100 UNIT/ML IV SOLN
500.0000 [IU] | Freq: Once | INTRAVENOUS | Status: AC | PRN
  Filled 2021-04-05: qty 5

## 2021-04-05 MED ORDER — SODIUM CHLORIDE 0.9% FLUSH
10.0000 mL | INTRAVENOUS | Status: DC | PRN
  Filled 2021-04-05: qty 10

## 2021-04-05 MED ORDER — HEPARIN SOD (PORK) LOCK FLUSH 100 UNIT/ML IV SOLN
INTRAVENOUS | Status: AC
  Administered 2021-04-05: 500 [IU]
  Filled 2021-04-05: qty 5

## 2021-04-05 MED ORDER — SODIUM CHLORIDE 0.9 % IV SOLN
Freq: Once | INTRAVENOUS | Status: AC
  Filled 2021-04-05: qty 250

## 2021-04-05 MED ORDER — SODIUM CHLORIDE 0.9 % IV SOLN
1200.0000 mg | Freq: Once | INTRAVENOUS | Status: AC
  Administered 2021-04-05: 1200 mg via INTRAVENOUS
  Filled 2021-04-05: qty 20

## 2021-04-05 NOTE — Patient Instructions (Signed)
[facility]  Discharge Instructions: °Thank you for choosing [facility] to provide your oncology and hematology care.  °If you have a lab appointment with the Cancer Center, please go directly to the Cancer Center and check in at the registration area. ° °Wear comfortable clothing and clothing appropriate for easy access to any Portacath or PICC line.  ° °We strive to give you quality time with your provider. You may need to reschedule your appointment if you arrive late (15 or more minutes).  Arriving late affects you and other patients whose appointments are after yours.  Also, if you miss three or more appointments without notifying the office, you may be dismissed from the clinic at the provider’s discretion.    °  °For prescription refill requests, have your pharmacy contact our office and allow 72 hours for refills to be completed.   ° °Today you received the following chemotherapy and/or immunotherapy agents - atezolizumab    °  °To help prevent nausea and vomiting after your treatment, we encourage you to take your nausea medication as directed. ° °BELOW ARE SYMPTOMS THAT SHOULD BE REPORTED IMMEDIATELY: °*FEVER GREATER THAN 100.4 F (38 °C) OR HIGHER °*CHILLS OR SWEATING °*NAUSEA AND VOMITING THAT IS NOT CONTROLLED WITH YOUR NAUSEA MEDICATION °*UNUSUAL SHORTNESS OF BREATH °*UNUSUAL BRUISING OR BLEEDING °*URINARY PROBLEMS (pain or burning when urinating, or frequent urination) °*BOWEL PROBLEMS (unusual diarrhea, constipation, pain near the anus) °TENDERNESS IN MOUTH AND THROAT WITH OR WITHOUT PRESENCE OF ULCERS (sore throat, sores in mouth, or a toothache) °UNUSUAL RASH, SWELLING OR PAIN  °UNUSUAL VAGINAL DISCHARGE OR ITCHING  ° °Items with * indicate a potential emergency and should be followed up as soon as possible or go to the Emergency Department if any problems should occur. ° °Please show the CHEMOTHERAPY ALERT CARD or IMMUNOTHERAPY ALERT CARD at check-in  to the Emergency Department and triage nurse. ° °Should you have questions after your visit or need to cancel or reschedule your appointment, please contact [facility]  [phone] and follow the prompts.  Office hours are 8:00 a.m. to 4:30 p.m. Monday - Friday. Please note that voicemails left after 4:00 p.m. may not be returned until the following business day.  We are closed weekends and major holidays. You have access to a nurse at all times for urgent questions. Please call the main number to the clinic [phone] and follow the prompts. ° °For any non-urgent questions, you may also contact your provider using MyChart. We now offer e-Visits for anyone 18 and older to request care online for non-urgent symptoms. For details visit [email or web address]. °  °Also download the MyChart app! Go to the app store, search "MyChart", open the app, select [facility], and log in with your MyChart username and password. ° °Due to Covid, a mask is required upon entering the hospital/clinic. If you do not have a mask, one will be given to you upon arrival. For doctor visits, patients may have 1 support person aged 18 or older with them. For treatment visits, patients cannot have anyone with them due to current Covid guidelines and our immunocompromised population.  ° °Atezolizumab injection °What is this medication? °ATEZOLIZUMAB (a te zoe LIZ ue mab) is a monoclonal antibody. It is used to treat bladder cancer (urothelial cancer), liver cancer, lung cancer, and melanoma. °This medicine may be used for other purposes; ask your health care provider or pharmacist if you have questions. °COMMON BRAND NAME(S):   Tecentriq °What should I tell my care team before I take this medication? °They need to know if you have any of these conditions: °autoimmune diseases like Crohn's disease, ulcerative colitis, or lupus °have had or planning to have an allogeneic stem cell transplant (uses someone  else's stem cells) °history of organ transplant °history of radiation to the chest °nervous system problems like myasthenia gravis or Guillain-Barre syndrome °an unusual or allergic reaction to atezolizumab, other medicines, foods, dyes, or preservatives °pregnant or trying to get pregnant °breast-feeding °How should I use this medication? °This medicine is for infusion into a vein. It is given by a health care professional in a hospital or clinic setting. °A special MedGuide will be given to you before each treatment. Be sure to read this information carefully each time. °Talk to your pediatrician regarding the use of this medicine in children. Special care may be needed. °Overdosage: If you think you have taken too much of this medicine contact a poison control center or emergency room at once. °NOTE: This medicine is only for you. Do not share this medicine with others. °What if I miss a dose? °It is important not to miss your dose. Call your doctor or health care professional if you are unable to keep an appointment. °What may interact with this medication? °Interactions have not been studied. °This list may not describe all possible interactions. Give your health care provider a list of all the medicines, herbs, non-prescription drugs, or dietary supplements you use. Also tell them if you smoke, drink alcohol, or use illegal drugs. Some items may interact with your medicine. °What should I watch for while using this medication? °Your condition will be monitored carefully while you are receiving this medicine. °You may need blood work done while you are taking this medicine. °Do not become pregnant while taking this medicine or for at least 5 months after stopping it. Women should inform their doctor if they wish to become pregnant or think they might be pregnant. There is a potential for serious side effects to an unborn child. Talk to your health care professional or pharmacist for more information. Do not  breast-feed an infant while taking this medicine or for at least 5 months after the last dose. °What side effects may I notice from receiving this medication? °Side effects that you should report to your doctor or health care professional as soon as possible: °allergic reactions like skin rash, itching or hives, swelling of the face, lips, or tongue °black, tarry stools °bloody or watery diarrhea °breathing problems °changes in vision °chest pain or chest tightness °chills °facial flushing °fever °headache °signs and symptoms of high blood sugar such as dizziness; dry mouth; dry skin; fruity breath; nausea; stomach pain; increased hunger or thirst; increased urination °signs and symptoms of liver injury like dark yellow or brown urine; general ill feeling or flu-like symptoms; light-colored stools; loss of appetite; nausea; right upper belly pain; unusually weak or tired; yellowing of the eyes or skin °stomach pain °trouble passing urine or change in the amount of urine °Side effects that usually do not require medical attention (report to your doctor or health care professional if they continue or are bothersome): °bone pain °cough °diarrhea °joint pain °muscle pain °muscle weakness °swelling of arms or legs °tiredness °weight loss °This list may not describe all possible side effects. Call your doctor for medical advice about side effects. You may report side effects to FDA at 1-800-FDA-1088. °Where should I keep my medication? °This   drug is given in a hospital or clinic and will not be stored at home. °NOTE: This sheet is a summary. It may not cover all possible information. If you have questions about this medicine, talk to your doctor, pharmacist, or health care provider. °© 2022 Elsevier/Gold Standard (2020-11-13 00:00:00) ° °

## 2021-04-05 NOTE — Progress Notes (Signed)
I sent patient message to [facility]

## 2021-04-05 NOTE — Progress Notes (Signed)
Hematology/Oncology Consult note [facility]  [phone] [phone]  Patient Care Team: Venita Lick, NP as PCP - General (Nurse Practitioner) Clent Jacks, RN as Oncology Nurse Navigator Sindy Guadeloupe, MD as Consulting Physician (Hematology and Oncology)   Name of the patient: Maxwell Edwards  [id number]  27-May-1955   Date of visit: 04/05/21  Diagnosis- locally advanced unresectable hepatocellular carcinoma    Chief complaint/ Reason for visit-on treatment assessment prior to next cycle of Tecentriq  Heme/Onc history: patient is a 67 year old male with a past medical history significant for hypertension hyperlipidemia, 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  Reports that he has lost about 20 pounds over the last 2 to 3 months. He is a chronic smoker which prompted a CT chest.  CT chest without contrast on 04/06/2020 showed no evidence of mediastinal or hilar adenopathy.  He was found to have 4 subcentimeter lung nodules 3 in the right lobe and one in the left upper lobe which were borderline for metastatic disease.  Cirrhotic liver morphology with 8.3 x 7 cm ill-defined hypoattenuating lesion in the left liver with a 3.3 cm lesion in the dome of the liver near the junction of the right and left hepatic lobes.  Labs showed elevated AST and ALT of 62 and 47 respectively with a normal albumin and normal total bilirubin.  CMP showed elevated H&H of 18.3/53.8.  Hepatitis C and HIV testing was negative. 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  Another mass in the segment 4A measuring 3.4 x 2.9 cm.  No evidence of local regional adenopathy.   Alpha-fetoprotein elevated at 38,000.   CA 19-9 elevated at 147.Patient's case discussed at tumor board and consensus was that this would be consistent with [facility] especially given the AFP value.  Initially MRI was not characteristic for [facility] but a biopsy was not deemed necessary after AFP results were back   Avastin Tecentriq started in March 2022.  He is also getting weekly phlebotomy for iron overload   Patient developed significant variceal bleeding in December 2022 and Avastin on hold since then    Interval history-patient reports doing well.  Energy levels are improving.  Denies any blood loss in his stool or urine.  Denies any dark melanotic stools.  Tremors are intermittent and states that they are manageable.  ECOG PS- 1 Pain scale- 0   Review of systems- Review of Systems  Constitutional:  Positive for malaise/fatigue.     No Known Allergies   Past Medical History:  Diagnosis Date   Barrett esophagus    CAD (coronary artery disease)    Cancer (HCC)    liver cancer   Cirrhosis ([place])    Heart disease    Hereditary hemochromatosis ([place])      Past Surgical History:  Procedure Laterality Date   CORONARY ARTERY BYPASS GRAFT     at 66 years old   ESOPHAGOGASTRODUODENOSCOPY (EGD) WITH PROPOFOL N/A 05/21/2020   Procedure: ESOPHAGOGASTRODUODENOSCOPY (EGD) WITH PROPOFOL;  Surgeon: Lin Landsman, MD;  Location: [facility];  Service: Gastroenterology;  Laterality: N/A;   ESOPHAGOGASTRODUODENOSCOPY (EGD) WITH PROPOFOL N/A 02/24/2021   Procedure: ESOPHAGOGASTRODUODENOSCOPY (EGD) WITH PROPOFOL;  Surgeon: Volney American  Legrand Como, DO;  Location: [facility];  Service: Gastroenterology;  Laterality: N/A;   LAPAROSCOPIC LYSIS OF ADHESIONS  06/12/2020   Procedure: LAPAROSCOPIC LYSIS OF ADHESIONS;  Surgeon: Jules Husbands, MD;  Location: [facility];  Service: General;;   PORTA CATH INSERTION N/A 04/30/2020   Procedure: PORTA CATH INSERTION;  Surgeon: Algernon Huxley, MD;  Location: [facility];  Service:  Cardiovascular;  Laterality: N/A;   VEIN BYPASS SURGERY      Social History   Socioeconomic History   Marital status: Divorced    Spouse name: Not on file   Number of children: 2   Years of education: Not on file   Highest education level: Not on file  Occupational History   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[employer] Use   Smoking status: Former   Smokeless tobacco: Never  Scientific laboratory technician Use: Never used  Substance and Sexual Activity   Alcohol use: Not Currently   Drug use: Not Currently   Sexual activity: Not Currently  Other Topics Concern   Not on file  Social History Narrative   Lives by himself    Social Determinants of Health   Financial Resource Strain: Not on file  Food Insecurity: Not on file  Transportation Needs: Not on file  Physical Activity: Not on file  Stress: Not on file  Social Connections: Not on file  Intimate Partner Violence: Not on file    Family History  Problem Relation Age of Onset   Dementia Mother    Ulcers Father    Heart attack Brother    Throat cancer Brother    Heart attack Daughter    Diabetes Daughter    Depression Daughter      Current Outpatient Medications:    pantoprazole (PROTONIX) 40 MG tablet, Take 1 tablet (40 mg total) by mouth 2 (two) times daily., Disp: 60 tablet, Rfl: 0 No current facility-administered medications for this visit.  Facility-Administered Medications Ordered in Other Visits:    sodium chloride flush (NS) 0.9 % injection 10 mL, 10 mL, Intracatheter, PRN, Sindy Guadeloupe, MD  Physical exam:  Vitals:   04/05/21 1028  BP: 135/76  Pulse: 83  Resp: 20  Temp: 97.8 F (36.6 C)  SpO2: 100%  Weight: 139 lb 1.6 oz (63.1 kg)   Physical Exam Constitutional:      General: He is not in acute distress. Cardiovascular:     Rate and Rhythm: Normal rate and regular rhythm.     Heart sounds: Normal heart sounds.  Pulmonary:     Effort: Pulmonary effort is normal.     Breath  sounds: Normal breath sounds.  Abdominal:     General: Bowel sounds are normal.     Palpations: Abdomen is soft.  Skin:    General: Skin is warm and dry.  Neurological:     Mental Status: He is alert and oriented to person, place, and time.     CMP Latest Ref Rng & Units 04/05/2021  Glucose 70 - 99 mg/dL 107(H)  BUN 8 - 23 mg/dL 17  Creatinine 0.61 - 1.24 mg/dL 0.81  Sodium 135 - 145 mmol/L 135  Potassium 3.5 - 5.1 mmol/L 3.9  Chloride 98 - 111 mmol/L 101  CO2 22 - 32 mmol/L 26  Calcium 8.9 - 10.3 mg/dL 9.2  Total Protein 6.5 - 8.1 g/dL 8.5(H)  Total Bilirubin 0.3 - 1.2 mg/dL 0.7  Alkaline Phos 38 - 126 U/L  473(H)  AST 15 - 41 U/L 67(H)  ALT 0 - 44 U/L 48(H)   CBC Latest Ref Rng & Units 04/05/2021  WBC 4.0 - 10.5 K/uL 5.1  Hemoglobin 13.0 - 17.0 g/dL 12.6(L)  Hematocrit 39.0 - 52.0 % 39.9  Platelets 150 - 400 K/uL 114(L)     Assessment and plan- Patient is a 66 y.o. male with locally advanced hepatocellular carcinoma here for on treatment assessment prior to next cycle of Tecentriq  HCC: Counts okay to proceed withCycle 16 of Tecentriq today.  I will see him back in 3 weeks for cycle 17.  Plan to repeat scans in March 2022.  aFP from today is pending.  If overall.  From variceal bleeding standpoint I will consider rechallenge in him with a Avastin in the future possibly 2 to 3 months from now if he has stable disease  Variceal bleed: S/p banding in December 2022.  He was supposed to get repeat EGD with consideration for banding again in 2 weeks but that was not scheduled.  I did get in touch with Dr. Marius Ditch today who will see the patient soon and scheduled for EGD.  Abnormal LFTs: Unclear if it is secondary to decompensated cirrhosis versus HCC versus hepatic iron overload.  Previously patient had abnormal LFTs which normalized after initiating phlebotomy.  Continue to monitor  Hereditary hemochromatosis: Ferritin levels are 159 today and hemoglobin has now normalizedTo 12.6.   We will reinitiate weekly phlebotomy with a goal ferritin of less than 50.   Visit Diagnosis 1. Encounter for antineoplastic immunotherapy   2. Iron overload   3. Hereditary hemochromatosis ([place])   4. Cancer, hepatocellular ([place])      Dr. Randa Evens, MD, MPH [facility] [phone] 04/05/2021 3:38 PM

## 2021-04-05 NOTE — Telephone Encounter (Signed)
Scheduled EGD with patient wife and schedule for 04/29/21. Patient wife verbalized understanding of instructions and sent to mychart. Made follow up appointment through mychart.

## 2021-04-06 LAB — AFP TUMOR MARKER: AFP, Serum, Tumor Marker: 49.4 ng/mL — ABNORMAL HIGH (ref 0.0–8.4)

## 2021-04-11 ENCOUNTER — Inpatient Hospital Stay: Payer: Medicare Other

## 2021-04-12 ENCOUNTER — Inpatient Hospital Stay: Payer: Medicare Other | Attending: Oncology

## 2021-04-12 ENCOUNTER — Other Ambulatory Visit: Payer: Self-pay

## 2021-04-12 ENCOUNTER — Inpatient Hospital Stay: Payer: Medicare Other

## 2021-04-12 DIAGNOSIS — C220 Liver cell carcinoma: Secondary | ICD-10-CM | POA: Insufficient documentation

## 2021-04-12 DIAGNOSIS — F17210 Nicotine dependence, cigarettes, uncomplicated: Secondary | ICD-10-CM | POA: Insufficient documentation

## 2021-04-12 DIAGNOSIS — Z79899 Other long term (current) drug therapy: Secondary | ICD-10-CM | POA: Diagnosis not present

## 2021-04-12 DIAGNOSIS — Z5112 Encounter for antineoplastic immunotherapy: Secondary | ICD-10-CM | POA: Insufficient documentation

## 2021-04-12 MED ORDER — SODIUM CHLORIDE 0.9 % IV SOLN
Freq: Once | INTRAVENOUS | Status: AC
  Filled 2021-04-12: qty 250

## 2021-04-12 NOTE — Progress Notes (Signed)
Phlebotomy performed today, 500 ml of blood removed per order. Pt tolerated well. 500 ml NS IVF replacement. Discharged to home. Feeling at his baseline.

## 2021-04-12 NOTE — Patient Instructions (Signed)

## 2021-04-16 ENCOUNTER — Encounter: Payer: Self-pay | Admitting: Oncology

## 2021-04-19 ENCOUNTER — Other Ambulatory Visit: Payer: Self-pay

## 2021-04-19 ENCOUNTER — Inpatient Hospital Stay: Payer: Medicare Other

## 2021-04-19 DIAGNOSIS — Z5112 Encounter for antineoplastic immunotherapy: Secondary | ICD-10-CM

## 2021-04-19 LAB — CBC WITH DIFFERENTIAL/PLATELET
Abs Immature Granulocytes: 0.02 10*3/uL (ref 0.00–0.07)
Basophils Absolute: 0.1 10*3/uL (ref 0.0–0.1)
Basophils Relative: 1 %
Eosinophils Absolute: 0.1 10*3/uL (ref 0.0–0.5)
Eosinophils Relative: 1 %
HCT: 38.6 % — ABNORMAL LOW (ref 39.0–52.0)
Hemoglobin: 12.2 g/dL — ABNORMAL LOW (ref 13.0–17.0)
Immature Granulocytes: 0 %
Lymphocytes Relative: 19 %
Lymphs Abs: 1.1 10*3/uL (ref 0.7–4.0)
MCH: 28 pg (ref 26.0–34.0)
MCHC: 31.6 g/dL (ref 30.0–36.0)
MCV: 88.5 fL (ref 80.0–100.0)
Monocytes Absolute: 0.7 10*3/uL (ref 0.1–1.0)
Monocytes Relative: 12 %
Neutro Abs: 4.1 10*3/uL (ref 1.7–7.7)
Neutrophils Relative %: 67 %
Platelets: 135 10*3/uL — ABNORMAL LOW (ref 150–400)
RBC: 4.36 MIL/uL (ref 4.22–5.81)
RDW: 17.2 % — ABNORMAL HIGH (ref 11.5–15.5)
WBC: 6.1 10*3/uL (ref 4.0–10.5)
nRBC: 0 % (ref 0.0–0.2)

## 2021-04-19 MED ORDER — HEPARIN SOD (PORK) LOCK FLUSH 100 UNIT/ML IV SOLN
500.0000 [IU] | Freq: Once | INTRAVENOUS | Status: DC
  Filled 2021-04-19: qty 5

## 2021-04-19 MED ORDER — SODIUM CHLORIDE 0.9% FLUSH
10.0000 mL | Freq: Once | INTRAVENOUS | Status: AC
  Administered 2021-04-19: 10 mL via INTRAVENOUS
  Filled 2021-04-19: qty 10

## 2021-04-19 MED ORDER — SODIUM CHLORIDE 0.9 % IV SOLN
Freq: Once | INTRAVENOUS | Status: AC
  Filled 2021-04-19: qty 250

## 2021-04-19 NOTE — Progress Notes (Signed)
Hbg 12.1 today, proceeded with 500 ml phlebotomy via PIV access in R AC. Tolerated procedure well. Received 500 ml Fluid replacement via his port. VSS. Feeling well at time of discharge.

## 2021-04-20 ENCOUNTER — Inpatient Hospital Stay: Payer: Medicare Other

## 2021-04-22 DIAGNOSIS — H25013 Cortical age-related cataract, bilateral: Secondary | ICD-10-CM | POA: Diagnosis not present

## 2021-04-22 DIAGNOSIS — H43393 Other vitreous opacities, bilateral: Secondary | ICD-10-CM | POA: Diagnosis not present

## 2021-04-22 DIAGNOSIS — H2513 Age-related nuclear cataract, bilateral: Secondary | ICD-10-CM | POA: Diagnosis not present

## 2021-04-22 DIAGNOSIS — H5203 Hypermetropia, bilateral: Secondary | ICD-10-CM | POA: Diagnosis not present

## 2021-04-26 ENCOUNTER — Other Ambulatory Visit: Payer: Self-pay

## 2021-04-26 ENCOUNTER — Encounter: Payer: Self-pay | Admitting: Gastroenterology

## 2021-04-26 ENCOUNTER — Ambulatory Visit: Payer: Medicare Other

## 2021-04-26 ENCOUNTER — Inpatient Hospital Stay: Payer: Medicare Other

## 2021-04-26 ENCOUNTER — Inpatient Hospital Stay (HOSPITAL_BASED_OUTPATIENT_CLINIC_OR_DEPARTMENT_OTHER): Payer: Medicare Other | Admitting: Oncology

## 2021-04-26 ENCOUNTER — Encounter: Payer: Self-pay | Admitting: Oncology

## 2021-04-26 VITALS — BP 113/70 | HR 72 | Resp 18

## 2021-04-26 DIAGNOSIS — Z5112 Encounter for antineoplastic immunotherapy: Secondary | ICD-10-CM

## 2021-04-26 DIAGNOSIS — C220 Liver cell carcinoma: Secondary | ICD-10-CM

## 2021-04-26 DIAGNOSIS — Z79899 Other long term (current) drug therapy: Secondary | ICD-10-CM | POA: Diagnosis not present

## 2021-04-26 DIAGNOSIS — F17210 Nicotine dependence, cigarettes, uncomplicated: Secondary | ICD-10-CM | POA: Diagnosis not present

## 2021-04-26 LAB — CBC WITH DIFFERENTIAL/PLATELET
Abs Immature Granulocytes: 0.02 10*3/uL (ref 0.00–0.07)
Basophils Absolute: 0.1 10*3/uL (ref 0.0–0.1)
Basophils Relative: 1 %
Eosinophils Absolute: 0.1 10*3/uL (ref 0.0–0.5)
Eosinophils Relative: 1 %
HCT: 37.8 % — ABNORMAL LOW (ref 39.0–52.0)
Hemoglobin: 11.9 g/dL — ABNORMAL LOW (ref 13.0–17.0)
Immature Granulocytes: 0 %
Lymphocytes Relative: 16 %
Lymphs Abs: 1.3 10*3/uL (ref 0.7–4.0)
MCH: 27.3 pg (ref 26.0–34.0)
MCHC: 31.5 g/dL (ref 30.0–36.0)
MCV: 86.7 fL (ref 80.0–100.0)
Monocytes Absolute: 0.7 10*3/uL (ref 0.1–1.0)
Monocytes Relative: 10 %
Neutro Abs: 5.5 10*3/uL (ref 1.7–7.7)
Neutrophils Relative %: 72 %
Platelets: 164 10*3/uL (ref 150–400)
RBC: 4.36 MIL/uL (ref 4.22–5.81)
RDW: 17.1 % — ABNORMAL HIGH (ref 11.5–15.5)
WBC: 7.6 10*3/uL (ref 4.0–10.5)
nRBC: 0 % (ref 0.0–0.2)

## 2021-04-26 LAB — COMPREHENSIVE METABOLIC PANEL
ALT: 44 U/L (ref 0–44)
AST: 56 U/L — ABNORMAL HIGH (ref 15–41)
Albumin: 3 g/dL — ABNORMAL LOW (ref 3.5–5.0)
Alkaline Phosphatase: 577 U/L — ABNORMAL HIGH (ref 38–126)
Anion gap: 8 (ref 5–15)
BUN: 16 mg/dL (ref 8–23)
CO2: 26 mmol/L (ref 22–32)
Calcium: 8.9 mg/dL (ref 8.9–10.3)
Chloride: 97 mmol/L — ABNORMAL LOW (ref 98–111)
Creatinine, Ser: 0.83 mg/dL (ref 0.61–1.24)
GFR, Estimated: >60 mL/min (ref >60)
Glucose, Bld: 124 mg/dL — ABNORMAL HIGH (ref 70–99)
Potassium: 4 mmol/L (ref 3.5–5.1)
Sodium: 131 mmol/L — ABNORMAL LOW (ref 135–145)
Total Bilirubin: 0.1 mg/dL — ABNORMAL LOW (ref 0.3–1.2)
Total Protein: 8.2 g/dL — ABNORMAL HIGH (ref 6.5–8.1)

## 2021-04-26 LAB — FERRITIN: Ferritin: 81 ng/mL (ref 24–336)

## 2021-04-26 MED ORDER — SODIUM CHLORIDE 0.9 % IV SOLN
Freq: Once | INTRAVENOUS | Status: AC
  Administered 2021-04-26: 500 mL via INTRAVENOUS
  Filled 2021-04-26: qty 250

## 2021-04-26 MED ORDER — SODIUM CHLORIDE 0.9 % IV SOLN
Freq: Once | INTRAVENOUS | Status: AC
  Filled 2021-04-26: qty 250

## 2021-04-26 MED ORDER — HEPARIN SOD (PORK) LOCK FLUSH 100 UNIT/ML IV SOLN
500.0000 [IU] | Freq: Once | INTRAVENOUS | Status: AC | PRN
  Filled 2021-04-26: qty 5

## 2021-04-26 MED ORDER — HEPARIN SOD (PORK) LOCK FLUSH 100 UNIT/ML IV SOLN
INTRAVENOUS | Status: AC
  Administered 2021-04-26: 500 [IU]
  Filled 2021-04-26: qty 5

## 2021-04-26 MED ORDER — SODIUM CHLORIDE 0.9 % IV SOLN
1200.0000 mg | Freq: Once | INTRAVENOUS | Status: AC
  Administered 2021-04-26: 1200 mg via INTRAVENOUS
  Filled 2021-04-26: qty 20

## 2021-04-26 MED ORDER — SODIUM CHLORIDE 0.9% FLUSH
10.0000 mL | INTRAVENOUS | Status: DC | PRN
  Administered 2021-04-26: 10 mL
  Filled 2021-04-26: qty 10

## 2021-04-26 NOTE — Progress Notes (Signed)
I connected with Maxwell Edwards on 04/26/21 at  9:00 AM EST by video enabled telemedicine visit and verified that I am speaking with the correct person using two identifiers.   I discussed the limitations, risks, security and privacy concerns of performing an evaluation and management service by telemedicine and the availability of in-person appointments. I also discussed with the patient that there may be a patient responsible charge related to this service. The patient expressed understanding and agreed to proceed.  Other persons participating in the visit and their role in the encounter:  none  Patient's location: Cancer center Provider's location:  home (due to illness)   Diagnosis-  locally advanced unresectable hepatocellular carcinoma    Chief complaint/ Reason for visit-on treatment assessment prior to cycle 17 of Tecentriq  Heme/Onc history: patient is a 66 year old male with a past medical history significant for hypertension hyperlipidemia, 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  Reports that he has lost about 20 pounds over the last 2 to 3 months. He is a chronic smoker which prompted a CT chest.  CT chest without contrast on 04/06/2020 showed no evidence of mediastinal or hilar adenopathy.  He was found to have 4 subcentimeter lung nodules 3 in the right lobe and one in the left upper lobe which were borderline for metastatic disease.  Cirrhotic liver morphology with 8.3 x 7 cm ill-defined hypoattenuating lesion in the left liver with a 3.3 cm lesion in the dome of the liver near the junction of the right and left hepatic lobes.  Labs showed elevated AST and ALT of 62 and 47 respectively with a normal albumin and normal total bilirubin.  CMP showed elevated H&H of 18.3/53.8.  Hepatitis C and HIV testing was negative. 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  Another mass in the segment 4A measuring 3.4 x 2.9 cm.  No evidence of local regional adenopathy.   Alpha-fetoprotein elevated at 38,000.  CA 19-9 elevated at 147.Patient's case discussed at tumor board and consensus was that this would be consistent with [facility] especially given the AFP value.  Initially MRI was not characteristic for [facility] but a biopsy was not deemed necessary after AFP results were back   Avastin Tecentriq started in March 2022.  He is also getting weekly phlebotomy for iron overload   Patient developed significant variceal bleeding in December 2022 and Avastin on hold since then    Interval history-patient reports feeling well.  He has baseline fatigue and intermittent hand tremors.  Denies any blood loss in his stool or urine.  Denies any dark melanotic stools.   Review of Systems  Constitutional:  Positive for malaise/fatigue. Negative for chills, fever and weight loss.  HENT:  Negative for congestion, ear discharge and nosebleeds.   Eyes:  Negative for blurred vision.  Respiratory:  Negative for cough, hemoptysis, sputum production, shortness of breath and wheezing.   Cardiovascular:  Negative for chest pain, palpitations, orthopnea and claudication.  Gastrointestinal:  Negative for abdominal pain, blood in stool, constipation, diarrhea, heartburn, melena, nausea and vomiting.  Genitourinary:  Negative for dysuria, flank pain, frequency, hematuria and urgency.  Musculoskeletal:  Negative for back pain, joint pain and myalgias.  Skin:  Negative for rash.  Neurological:  Negative for dizziness, tingling, focal weakness, seizures, weakness and headaches.  Endo/Heme/Allergies:  Does not bruise/bleed easily.  Psychiatric/Behavioral:  Negative for depression and suicidal ideas. The patient does not have insomnia.    No Known Allergies  Past  Medical History:  Diagnosis Date   Barrett esophagus    CAD (coronary artery disease)    Cancer (HCC)    liver cancer   Cirrhosis ([place])    Heart disease    Hereditary hemochromatosis ([place])     Past Surgical History:  Procedure Laterality Date   CORONARY ARTERY BYPASS GRAFT     at 66 years old   ESOPHAGOGASTRODUODENOSCOPY (EGD) WITH PROPOFOL N/A 05/21/2020   Procedure: ESOPHAGOGASTRODUODENOSCOPY (EGD) WITH PROPOFOL;  Surgeon: Lin Landsman, MD;  Location: [place];  Service: Gastroenterology;  Laterality: N/A;   ESOPHAGOGASTRODUODENOSCOPY (EGD) WITH PROPOFOL N/A 02/24/2021   Procedure: ESOPHAGOGASTRODUODENOSCOPY (EGD) WITH PROPOFOL;  Surgeon: Annamaria Helling, DO;  Location: [facility] ENDOSCOPY;  Service: Gastroenterology;  Laterality: N/A;   LAPAROSCOPIC LYSIS OF ADHESIONS  06/12/2020   Procedure: LAPAROSCOPIC LYSIS OF ADHESIONS;  Surgeon: Jules Husbands, MD;  Location: [facility] ORS;  Service: General;;   PORTA CATH INSERTION N/A 04/30/2020   Procedure: PORTA CATH INSERTION;  Surgeon: Algernon Huxley, MD;  Location: [place] CV LAB;  Service: Cardiovascular;  Laterality: N/A;   VEIN BYPASS SURGERY      Social History   Socioeconomic History   Marital status: Divorced    Spouse name: Not on file   Number of children: 2   Years of education: Not on file   Highest education level: Not on file  Occupational History   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[employer] Use   Smoking status: Former   Smokeless tobacco: Never  Scientific laboratory technician Use: Never used  Substance and Sexual Activity   Alcohol use: Not Currently   Drug use: Not Currently   Sexual activity: Not Currently  Other Topics Concern   Not on file  Social History Narrative   Lives by himself    Social Determinants of Health   Financial Resource Strain: Not on file  Food Insecurity: Not on file  Transportation Needs: Not on file  Physical Activity: Not on file  Stress: Not on file   Social Connections: Not on file  Intimate Partner Violence: Not on file    Family History  Problem Relation Age of Onset   Dementia Mother    Ulcers Father    Heart attack Brother    Throat cancer Brother    Heart attack Daughter    Diabetes Daughter    Depression Daughter     No current outpatient medications on file. No current facility-administered medications for this visit.  Facility-Administered Medications Ordered in Other Visits:    sodium chloride flush (NS) 0.9 % injection 10 mL, 10 mL, Intracatheter, PRN, Sindy Guadeloupe, MD, 10 mL at 04/26/21 0942  No results found.  No images are attached to the encounter.   CMP Latest Ref Rng & Units 04/26/2021  Glucose 70 - 99 mg/dL 124(H)  BUN 8 - 23 mg/dL 16  Creatinine 0.61 - 1.24 mg/dL 0.83  Sodium 135 - 145 mmol/L 131(L)  Potassium 3.5 - 5.1 mmol/L 4.0  Chloride 98 - 111 mmol/L 97(L)  CO2 22 - 32 mmol/L 26  Calcium 8.9 - 10.3 mg/dL 8.9  Total Protein 6.5 - 8.1 g/dL 8.2(H)  Total Bilirubin 0.3 - 1.2 mg/dL  0.1(L)  Alkaline Phos 38 - 126 U/L 577(H)  AST 15 - 41 U/L 56(H)  ALT 0 - 44 U/L 44   CBC Latest Ref Rng & Units 04/26/2021  WBC 4.0 - 10.5 K/uL 7.6  Hemoglobin 13.0 - 17.0 g/dL 11.9(L)  Hematocrit 39.0 - 52.0 % 37.8(L)  Platelets 150 - 400 K/uL 164     Observation/objective: Appears in no acute distress over video visit today.  Breathing is nonlabored  Assessment and plan: Patient is a 66 year old male with locally advanced hepatocellular carcinoma here for on treatment assessment prior to cycle 17 of palliative Tecentriq  I am currently awaiting GI evaluation and possible repeat EGD to decide whether I can restart a Avastin at some point which is critical in the management of patient's [place].  However given his significant GI bleed I am holding it off for now.  Counts are otherwise okay to proceed with cycle 17 of Tecentriq today.  Hereditary hemochromatosis: Continue weekly phlebotomy as long as hemoglobin is  greater than 11.5.  Goal ferritin is less than 100.  Abnormal LFTs: Bilirubin has normalized AST ALT is improving.  Alkaline phosphatase is still trending up.  Continue to monitor  Follow-up instructions: I will see him back in 3 weeks with CBC with differential CMP for next cycle of Tecentriq.  He will need a repeat CT chest abdomen and pelvis with contrast in about 4 to 5 weeks time  I discussed the assessment and treatment plan with the patient. The patient was provided an opportunity to ask questions and all were answered. The patient agreed with the plan and demonstrated an understanding of the instructions.   The patient was advised to call back or seek an in-person evaluation if the symptoms worsen or if the condition fails to improve as anticipated.   Visit Diagnosis: 1. Hereditary hemochromatosis ([place])   2. Cancer, hepatocellular ([place])   3. Encounter for antineoplastic immunotherapy     Dr. Randa Evens, MD, MPH [facility] Tel- [phone] 04/26/2021 11:32 AM

## 2021-04-26 NOTE — Progress Notes (Signed)
0936- Per MD, Dr. Janese Banks, order: proceed with scheduled Tecentriq treatment and 500 ml therapeutic phlebotomy today; therapeutic phlebotomy and post therapeutic phlebotomy 0.9% Sodium Chloride fluid replacement orders are in supportive therapy plan.

## 2021-04-26 NOTE — Progress Notes (Signed)
Pt doing ok today, he says he eats and drinks all the .time

## 2021-04-26 NOTE — Patient Instructions (Addendum)
[facility] [place]   Discharge Instructions: Thank you for choosing [facility] to provide your oncology and hematology care.  If you have a lab appointment with the [facility], please go directly to the [place] and check in at the registration area.   Wear comfortable clothing and clothing appropriate for easy access to any Portacath or PICC line.   We strive to give you quality time with your provider. You may need to reschedule your appointment if you arrive late (15 or more minutes).  Arriving late affects you and other patients whose appointments are after yours.  Also, if you miss three or more appointments without notifying the office, you may be dismissed from the clinic at the providers discretion.      For prescription refill requests, have your pharmacy contact our office and allow 72 hours for refills to be completed.    Today you received the following chemotherapy and/or immunotherapy agents: Tecentriq. Today you also received the following: Therapeutic Phlebotomy.      To help prevent nausea and vomiting after your treatment, we encourage you to take your nausea medication as directed.  BELOW ARE SYMPTOMS THAT SHOULD BE REPORTED IMMEDIATELY: *FEVER GREATER THAN 100.4 F (38 C) OR HIGHER *CHILLS OR SWEATING *NAUSEA AND VOMITING THAT IS NOT CONTROLLED WITH YOUR NAUSEA MEDICATION *UNUSUAL SHORTNESS OF BREATH *UNUSUAL BRUISING OR BLEEDING *URINARY PROBLEMS (pain or burning when urinating, or frequent urination) *BOWEL PROBLEMS (unusual diarrhea, constipation, pain near the anus) TENDERNESS IN MOUTH AND THROAT WITH OR WITHOUT PRESENCE OF ULCERS (sore throat, sores in mouth, or a toothache) UNUSUAL RASH, SWELLING OR PAIN  UNUSUAL VAGINAL DISCHARGE OR ITCHING   Items with * indicate a potential emergency and should be followed up as soon as possible or go to the Emergency Department if any problems should occur.  Please show the  CHEMOTHERAPY ALERT CARD or IMMUNOTHERAPY ALERT CARD at check-in to the Emergency Department and triage nurse.  Should you have questions after your visit or need to cancel or reschedule your appointment, please contact [facility] [place]  Dept: [phone]  and follow the prompts.  Office hours are 8:00 a.m. to 4:30 p.m. Monday - Friday. Please note that voicemails left after 4:00 p.m. may not be returned until the following business day.  We are closed weekends and major holidays. You have access to a nurse at all times for urgent questions. Please call the main number to the clinic Dept: [phone] and follow the prompts.  For any non-urgent questions, you may also contact your provider using MyChart. We now offer e-Visits for anyone 34 and older to request care online for non-urgent symptoms. For details visit [email or web address].   Also download the MyChart app! Go to the app store, search "MyChart", open the app, select [facility], and log in with your MyChart username and password.  Due to Covid, a mask is required upon entering the hospital/clinic. If you do not have a mask, one will be given to you upon arrival. For doctor visits, patients may have 1 support person aged 107 or older with them. For treatment visits, patients cannot have anyone with them due to current Covid guidelines and our immunocompromised population.   Therapeutic Phlebotomy Discharge Instructions  - Increase your fluid intake over the next 4 hours  - No smoking for 30 minutes  - Avoid using the affected arm (the one you had the blood drawn from) for heavy lifting or other  activities.  - You may resume all normal activities after 30 minutes.  You are to notify the office if you experience:   - Persistent dizziness and/or lightheadedness  -Uncontrolled or excessive bleeding at the site.

## 2021-04-29 ENCOUNTER — Other Ambulatory Visit: Payer: Self-pay

## 2021-04-29 ENCOUNTER — Encounter: Payer: Self-pay | Admitting: Gastroenterology

## 2021-04-29 ENCOUNTER — Ambulatory Visit: Payer: Medicare Other | Admitting: Anesthesiology

## 2021-04-29 ENCOUNTER — Ambulatory Visit
Admission: RE | Admit: 2021-04-29 | Discharge: 2021-04-29 | Disposition: A | Discharge status: Home / Self Care | Payer: Medicare Other | Attending: Gastroenterology | Admitting: Gastroenterology

## 2021-04-29 ENCOUNTER — Encounter
Admission: RE | Disposition: A | Discharge status: Home / Self Care | Payer: Self-pay | Source: Home / Self Care | Attending: Gastroenterology

## 2021-04-29 DIAGNOSIS — I8500 Esophageal varices without bleeding: Secondary | ICD-10-CM | POA: Insufficient documentation

## 2021-04-29 DIAGNOSIS — K449 Diaphragmatic hernia without obstruction or gangrene: Secondary | ICD-10-CM | POA: Diagnosis not present

## 2021-04-29 DIAGNOSIS — D649 Anemia, unspecified: Secondary | ICD-10-CM | POA: Diagnosis not present

## 2021-04-29 DIAGNOSIS — I2510 Atherosclerotic heart disease of native coronary artery without angina pectoris: Secondary | ICD-10-CM | POA: Diagnosis not present

## 2021-04-29 DIAGNOSIS — E785 Hyperlipidemia, unspecified: Secondary | ICD-10-CM | POA: Diagnosis not present

## 2021-04-29 DIAGNOSIS — K7460 Unspecified cirrhosis of liver: Secondary | ICD-10-CM

## 2021-04-29 DIAGNOSIS — D759 Disease of blood and blood-forming organs, unspecified: Secondary | ICD-10-CM | POA: Diagnosis not present

## 2021-04-29 DIAGNOSIS — J449 Chronic obstructive pulmonary disease, unspecified: Secondary | ICD-10-CM | POA: Insufficient documentation

## 2021-04-29 DIAGNOSIS — K2270 Barrett's esophagus without dysplasia: Secondary | ICD-10-CM | POA: Diagnosis not present

## 2021-04-29 DIAGNOSIS — I10 Essential (primary) hypertension: Secondary | ICD-10-CM | POA: Insufficient documentation

## 2021-04-29 DIAGNOSIS — K7290 Hepatic failure, unspecified without coma: Secondary | ICD-10-CM

## 2021-04-29 DIAGNOSIS — Z87891 Personal history of nicotine dependence: Secondary | ICD-10-CM | POA: Insufficient documentation

## 2021-04-29 DIAGNOSIS — Z8719 Personal history of other diseases of the digestive system: Secondary | ICD-10-CM | POA: Diagnosis not present

## 2021-04-29 DIAGNOSIS — K2289 Other specified disease of esophagus: Secondary | ICD-10-CM | POA: Insufficient documentation

## 2021-04-29 HISTORY — PX: ESOPHAGOGASTRODUODENOSCOPY (EGD) WITH PROPOFOL: SHX5813

## 2021-04-29 SURGERY — ESOPHAGOGASTRODUODENOSCOPY (EGD) WITH PROPOFOL
Anesthesia: General

## 2021-04-29 MED ORDER — SODIUM CHLORIDE 0.9 % IV SOLN: INTRAVENOUS | Status: DC

## 2021-04-29 MED ORDER — LIDOCAINE HCL (CARDIAC) PF 100 MG/5ML IV SOSY
PREFILLED_SYRINGE | INTRAVENOUS | Status: DC | PRN
Start: 2021-04-29 — End: 2021-04-29
  Administered 2021-04-29: 80 mg via INTRAVENOUS

## 2021-04-29 MED ORDER — PROPOFOL 10 MG/ML IV BOLUS
INTRAVENOUS | Status: DC | PRN
Start: 2021-04-29 — End: 2021-04-29
  Administered 2021-04-29: 150 mg via INTRAVENOUS

## 2021-04-29 NOTE — Transfer of Care (Signed)
Immediate Anesthesia Transfer of Care Note  Patient: Maxwell Edwards  Procedure(s) Performed: ESOPHAGOGASTRODUODENOSCOPY (EGD) WITH PROPOFOL  Patient Location: Endoscopy Unit  Anesthesia Type:General  Level of Consciousness: drowsy  Airway & Oxygen Therapy: Patient Spontanous Breathing and Patient connected to face mask oxygen  Post-op Assessment: Report given to RN and Post -op Vital signs reviewed and stable  Post vital signs: Reviewed and stable  Last Vitals:  Vitals Value Taken Time  BP 105/66 04/29/21 1035  Temp 36.4 C 04/29/21 1034  Pulse 69 04/29/21 1035  Resp 30 04/29/21 1035  SpO2 100 % 04/29/21 1035    Last Pain:  Vitals:   04/29/21 1034  TempSrc: Tympanic  PainSc: Asleep         Complications: No notable events documented.

## 2021-04-29 NOTE — Anesthesia Preprocedure Evaluation (Signed)
Anesthesia Evaluation  Patient identified by MRN, date of birth, ID band Patient awake    Reviewed: Allergy & Precautions, H&P , NPO status , Patient's Chart, lab work & pertinent test results, reviewed documented beta blocker date and time   History of Anesthesia Complications Negative for: history of anesthetic complications  Airway Mallampati: III   Neck ROM: full    Dental  (+) Poor Dentition, Dental Advidsory Given   Pulmonary neg shortness of breath, COPD, neg recent URI, Patient abstained from smoking., former smoker,    Pulmonary exam normal        Cardiovascular Exercise Tolerance: Poor hypertension, On Medications (-) angina+ CAD  (-) Past MI Normal cardiovascular exam Rhythm:regular Rate:Normal     Neuro/Psych negative neurological ROS  negative psych ROS   GI/Hepatic negative GI ROS, Neg liver ROS,   Endo/Other  negative endocrine ROS  Renal/GU negative Renal ROS  negative genitourinary   Musculoskeletal   Abdominal   Peds  Hematology  (+) Blood dyscrasia, anemia ,   Anesthesia Other Findings Past Medical History: No date: Barrett esophagus No date: CAD (coronary artery disease) No date: Cancer ([place])     Comment:  liver cancer No date: Cirrhosis ([place]) No date: Heart disease No date: Hereditary hemochromatosis ([place]) Past Surgical History: No date: CORONARY ARTERY BYPASS GRAFT     Comment:  at 66 years old 05/21/2020: ESOPHAGOGASTRODUODENOSCOPY (EGD) WITH PROPOFOL; N/A     Comment:  Procedure: ESOPHAGOGASTRODUODENOSCOPY (EGD) WITH               PROPOFOL;  Surgeon: Lin Landsman, MD;  Location:               [facility] ENDOSCOPY;  Service: Gastroenterology;  Laterality:               N/A; 06/12/2020: LAPAROSCOPIC LYSIS OF ADHESIONS     Comment:  Procedure: LAPAROSCOPIC LYSIS OF ADHESIONS;  Surgeon:               Jules Husbands, MD;  Location: [facility] ORS;  Service:               General;; 04/30/2020:  PORTA CATH INSERTION; N/A     Comment:  Procedure: PORTA CATH INSERTION;  Surgeon: Algernon Huxley,              MD;  Location: [place] [facility] LAB;  Service:               Cardiovascular;  Laterality: N/A; No date: VEIN BYPASS SURGERY BMI    Body Mass Index: 21.41 kg/m     Reproductive/Obstetrics negative OB ROS                             Anesthesia Physical  Anesthesia Plan  ASA: 4  Anesthesia Plan: General   Post-op Pain Management:    Induction: Intravenous  PONV Risk Score and Plan: 2 and Propofol infusion and TIVA  Airway Management Planned: Natural Airway and Nasal Cannula  Additional Equipment:   Intra-op Plan:   Post-operative Plan:   Informed Consent: I have reviewed the patients History and Physical, chart, labs and discussed the procedure including the risks, benefits and alternatives for the proposed anesthesia with the patient or authorized representative who has indicated his/her understanding and acceptance.     Dental Advisory Given  Plan Discussed with: CRNA  Anesthesia Plan Comments:         Anesthesia  Quick Evaluation

## 2021-04-29 NOTE — Anesthesia Postprocedure Evaluation (Signed)
Anesthesia Post Note  Patient: Maxwell Edwards  Procedure(s) Performed: ESOPHAGOGASTRODUODENOSCOPY (EGD) WITH PROPOFOL  Patient location during evaluation: Endoscopy Anesthesia Type: General Level of consciousness: awake and alert Pain management: pain level controlled Vital Signs Assessment: post-procedure vital signs reviewed and stable Respiratory status: spontaneous breathing, nonlabored ventilation, respiratory function stable and patient connected to nasal cannula oxygen Cardiovascular status: blood pressure returned to baseline and stable Postop Assessment: no apparent nausea or vomiting Anesthetic complications: no   No notable events documented.   Last Vitals:  Vitals:   04/29/21 1054 04/29/21 1104  BP: 110/68 124/66  Pulse: 66 69  Resp: 19 20  Temp:    SpO2: 100% 99%    Last Pain:  Vitals:   04/29/21 1104  TempSrc:   PainSc: 0-No pain                 Martha Clan

## 2021-04-29 NOTE — Op Note (Signed)
[facility] Gastroenterology Patient Name: Maxwell Edwards Procedure Date: 04/29/2021 10:19 AM MRN: [id number] Account #: [id number] Date of Birth: 01-Aug-1955 Admit Type: Outpatient Age: 66 Room: [facility] ENDO ROOM 1 Gender: Male Note Status: Finalized Instrument Name: Upper Endoscope [id number] Procedure:             Upper GI endoscopy Indications:           Esophageal varices, Follow-up of esophageal varices Providers:             Lin Landsman MD, MD Referring MD:          Barbaraann Faster. Ned Card (Referring MD) Medicines:             General Anesthesia Complications:         No immediate complications. Estimated blood loss: None. Procedure:             Pre-Anesthesia Assessment:                        - Prior to the procedure, a History and Physical was                         performed, and patient medications and allergies were                         reviewed. The patient is competent. The risks and                         benefits of the procedure and the sedation options and                         risks were discussed with the patient. All questions                         were answered and informed consent was obtained.                         Patient identification and proposed procedure were                         verified by the physician, the nurse, the                         anesthesiologist, the anesthetist and the technician                         in the pre-procedure area in the procedure room in the                         endoscopy suite. Mental Status Examination: alert and                         oriented. Airway Examination: normal oropharyngeal                         airway and neck mobility. Respiratory Examination:                         clear to auscultation. CV Examination: normal.  Prophylactic Antibiotics: The patient does not require                         prophylactic antibiotics. Prior Anticoagulants: The                          patient has taken no previous anticoagulant or                         antiplatelet agents. ASA Grade Assessment: III - A                         patient with severe systemic disease. After reviewing                         the risks and benefits, the patient was deemed in                         satisfactory condition to undergo the procedure. The                         anesthesia plan was to use general anesthesia.                         Immediately prior to administration of medications,                         the patient was re-assessed for adequacy to receive                         sedatives. The heart rate, respiratory rate, oxygen                         saturations, blood pressure, adequacy of pulmonary                         ventilation, and response to care were monitored                         throughout the procedure. The physical status of the                         patient was re-assessed after the procedure.                        After obtaining informed consent, the endoscope was                         passed under direct vision. Throughout the procedure,                         the patient's blood pressure, pulse, and oxygen                         saturations were monitored continuously. The Endoscope                         was introduced through the mouth, and advanced to the  second part of duodenum. The upper GI endoscopy was                         accomplished without difficulty. The patient tolerated                         the procedure well. Findings:      The duodenal bulb and second portion of the duodenum were normal.      The entire examined stomach was normal.      The cardia and gastric fundus were normal on retroflexion.      One column of very small (< 5 mm) varices with no bleeding and no       stigmata of recent bleeding were found in the lower third of the       esophagus,. They were diminutive in  size. No red wale signs were       present. Scarring from prior treatment was visible. The varices appeared       smaller than they were at prior exam, did not require banding.      There were esophageal mucosal changes consistent with long-segment       Barrett's esophagus present in the lower third of the esophagus. The       maximum longitudinal extent of these mucosal changes was 8 cm in length,       biopsies not performed due to presence of varices. Impression:            - Normal duodenal bulb and second portion of the                         duodenum.                        - Normal stomach.                        - Small (< 5 mm) esophageal varices with no bleeding                         and no stigmata of recent bleeding.                        - Esophageal mucosal changes consistent with                         long-segment Barrett's esophagus.                        - No specimens collected. Recommendation:        - Discharge patient to home (with escort).                        - Resume previous diet today.                        - Continue present medications.                        - Return to oncologist previously scheduled.                        - Ok  to proceed with avastin Procedure Code(s):     --- Professional ---                        [phone], Esophagogastroduodenoscopy, flexible,                         transoral; diagnostic, including collection of                         specimen(s) by brushing or washing, when performed                         (separate procedure) Diagnosis Code(s):     --- Professional ---                        I85.00, Esophageal varices without bleeding                        K22.8, Other specified diseases of esophagus CPT copyright 2019 American Medical Association. All rights reserved. The codes documented in this report are preliminary and upon coder review may  be revised to meet current compliance requirements. Dr. Ulyess Mort Lin Landsman MD, MD 04/29/2021 10:37:26 AM This report has been signed electronically. Number of Addenda: 0 Note Initiated On: 04/29/2021 10:19 AM Estimated Blood Loss:  Estimated blood loss: none.      [facility]

## 2021-04-29 NOTE — H&P (Signed)
Maxwell Darby, MD [address]  [place]  [place], [place] [address]  Main: [phone]  Fax: [phone] Pager: [phone]  Primary Care Physician:  Venita Lick, NP Primary Gastroenterologist:  Dr. Cephas Edwards  Pre-Procedure History & Physical: HPI:  Maxwell Edwards is a 66 y.o. male is here for an endoscopy.   Past Medical History:  Diagnosis Date   Barrett esophagus    CAD (coronary artery disease)    Cancer ([place])    liver cancer   Cirrhosis ([facility])    Heart disease    Hereditary hemochromatosis ([place])     Past Surgical History:  Procedure Laterality Date   CORONARY ARTERY BYPASS GRAFT     at 66 years old   ESOPHAGOGASTRODUODENOSCOPY (EGD) WITH PROPOFOL N/A 05/21/2020   Procedure: ESOPHAGOGASTRODUODENOSCOPY (EGD) WITH PROPOFOL;  Surgeon: Lin Landsman, MD;  Location: [facility];  Service: Gastroenterology;  Laterality: N/A;   ESOPHAGOGASTRODUODENOSCOPY (EGD) WITH PROPOFOL N/A 02/24/2021   Procedure: ESOPHAGOGASTRODUODENOSCOPY (EGD) WITH PROPOFOL;  Surgeon: Annamaria Helling, DO;  Location: [facility] ENDOSCOPY;  Service: Gastroenterology;  Laterality: N/A;   LAPAROSCOPIC LYSIS OF ADHESIONS  06/12/2020   Procedure: LAPAROSCOPIC LYSIS OF ADHESIONS;  Surgeon: Jules Husbands, MD;  Location: [facility] ORS;  Service: General;;   PORTA CATH INSERTION N/A 04/30/2020   Procedure: PORTA CATH INSERTION;  Surgeon: Algernon Huxley, MD;  Location: [facility];  Service: Cardiovascular;  Laterality: N/A;   VEIN BYPASS SURGERY      Prior to Admission medications   Not on File    Allergies as of 04/05/2021   (No Known Allergies)    Family History  Problem Relation Age of Onset   Dementia Mother    Ulcers Father    Heart attack Brother    Throat cancer Brother    Heart attack Daughter    Diabetes Daughter    Depression Daughter     Social History   Socioeconomic History   Marital status: Divorced    Spouse name: Not on file   Number of children:  2   Years of education: Not on file   Highest education level: Not on file  Occupational History   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[employer]   Tobacco Use   Smoking status: Former    Types: Cigarettes   Smokeless tobacco: Never  Scientific laboratory technician Use: Never used  Substance and Sexual Activity   Alcohol use: Not Currently   Drug use: Not Currently   Sexual activity: Not Currently  Other Topics Concern   Not on file  Social History Narrative   Lives by himself    Social Determinants of Health   Financial Resource Strain: Not on file  Food Insecurity: Not on file  Transportation Needs: Not on file  Physical Activity: Not on file  Stress: Not on file  Social Connections: Not on file  Intimate Partner Violence: Not on file    Review of Systems: See HPI, otherwise negative ROS  Physical Exam: BP 103/66    Pulse 94    Temp 97.9 F (36.6 C) (Temporal)    Resp 18    Ht 5\' 9"  (1.753 m)    Wt 63 kg    SpO2 99%    BMI 20.53 kg/m  General:   Alert,  pleasant and cooperative in NAD Head:  Normocephalic and atraumatic. Neck:  Supple; no masses or thyromegaly. Lungs:  Clear throughout to auscultation.    Heart:  Regular rate and rhythm. Abdomen:  Soft, nontender and nondistended. Normal bowel sounds, without guarding, and without rebound.   Neurologic:  Alert and  oriented x4;  grossly normal neurologically.  Impression/Plan: Maxwell Edwards is here for an endoscopy to be performed for follow up of esophageal varices  Risks, benefits, limitations, and alternatives regarding  endoscopy have been reviewed with the patient.  Questions have been answered.  All parties agreeable.   Sherri Sear, MD  04/29/2021, 9:43 AM

## 2021-05-03 ENCOUNTER — Other Ambulatory Visit: Payer: Self-pay

## 2021-05-03 ENCOUNTER — Inpatient Hospital Stay: Payer: Medicare Other

## 2021-05-03 DIAGNOSIS — F17210 Nicotine dependence, cigarettes, uncomplicated: Secondary | ICD-10-CM | POA: Diagnosis not present

## 2021-05-03 DIAGNOSIS — C220 Liver cell carcinoma: Secondary | ICD-10-CM | POA: Diagnosis not present

## 2021-05-03 DIAGNOSIS — Z5112 Encounter for antineoplastic immunotherapy: Secondary | ICD-10-CM | POA: Diagnosis not present

## 2021-05-03 DIAGNOSIS — Z79899 Other long term (current) drug therapy: Secondary | ICD-10-CM | POA: Diagnosis not present

## 2021-05-03 LAB — HEMOGLOBIN: Hemoglobin: 11 g/dL — ABNORMAL LOW (ref 13.0–17.0)

## 2021-05-03 LAB — HEMATOCRIT: HCT: 34.9 % — ABNORMAL LOW (ref 39.0–52.0)

## 2021-05-03 MED ORDER — SODIUM CHLORIDE 0.9% FLUSH
10.0000 mL | Freq: Once | INTRAVENOUS | Status: AC
  Administered 2021-05-03: 10 mL via INTRAVENOUS
  Filled 2021-05-03: qty 10

## 2021-05-03 MED ORDER — HEPARIN SOD (PORK) LOCK FLUSH 100 UNIT/ML IV SOLN
500.0000 [IU] | Freq: Once | INTRAVENOUS | Status: AC
  Administered 2021-05-03: 500 [IU] via INTRAVENOUS
  Filled 2021-05-03: qty 5

## 2021-05-03 MED ORDER — SODIUM CHLORIDE 0.9 % IV SOLN
Freq: Once | INTRAVENOUS | Status: AC
  Filled 2021-05-03: qty 250

## 2021-05-03 NOTE — Progress Notes (Signed)
Started IVF's in preparation of phlebotomy. Hgb is 11.0 today. Hct 34.9. Hold phlebotomy today per Dr Janese Banks. Ferritin on 04/26/21 was 81. Pt received 500 ml of fluids for hydration today.

## 2021-05-04 ENCOUNTER — Encounter: Payer: Self-pay | Admitting: Oncology

## 2021-05-04 ENCOUNTER — Encounter: Payer: Self-pay | Admitting: Gastroenterology

## 2021-05-06 ENCOUNTER — Telehealth (INDEPENDENT_AMBULATORY_CARE_PROVIDER_SITE_OTHER): Payer: Medicare Other | Admitting: Gastroenterology

## 2021-05-06 ENCOUNTER — Encounter: Payer: Self-pay | Admitting: Gastroenterology

## 2021-05-06 DIAGNOSIS — K2270 Barrett's esophagus without dysplasia: Secondary | ICD-10-CM | POA: Diagnosis not present

## 2021-05-06 DIAGNOSIS — I8510 Secondary esophageal varices without bleeding: Secondary | ICD-10-CM | POA: Diagnosis not present

## 2021-05-06 MED ORDER — ESOMEPRAZOLE MAGNESIUM 40 MG PO PACK: 40.0000 mg | PACK | Freq: Two times a day (BID) | ORAL | 2 refills | Status: DC

## 2021-05-06 MED ORDER — NADOLOL 20 MG PO TABS: 20.0000 mg | ORAL_TABLET | Freq: Every day | ORAL | 3 refills | Status: DC

## 2021-05-06 NOTE — Progress Notes (Signed)
Sherri Sear, MD [address]  [place]  [place],  [address]  Main: [phone]  Fax: [phone]    Gastroenterology Consultation Video Visit  Referring Provider:     Venita Lick, NP Primary Care Physician:  Venita Lick, NP Primary Gastroenterologist:  Dr. Cephas Darby Reason for Consultation:     Decompensated cirrhosis, hepatocellular carcinoma        HPI:   Maxwell Edwards is a 66 y.o. male referred by Dr. Venita Lick, NP  for consultation & management of decompensated cirrhosis, hepatocellular carcinoma  Virtual Visit Video Note  I connected with Maxwell Edwards on 05/06/21 at  3:30 PM EST by video and verified that I am speaking with the correct person using two identifiers.   I discussed the limitations, risks, security and privacy concerns of performing an evaluation and management service by video and the availability of in person appointments. I also discussed with the patient that there may be a patient responsible charge related to this service. The patient expressed understanding and agreed to proceed.  Location of the Patient: Home  Location of the provider: Office  Persons participating in the visit: Patient, patient's wife and provider   History of Present Illness: Maxwell Edwards is a 66 year old male with history of hypertension, hyperlipidemia, history of coronary disease s/p CABG, history of hereditary hemochromatosis, decompensated cirrhosis of liver, hepatocellular carcinoma diagnosed in January 2022.  Patient is undergoing weekly phlebotomy for history hemochromatosis, also being treated for [facility] with Tecentriq and Avastin since March 2022.  Patient developed upper GI bleed secondary to esophageal varices in December 2022, underwent variceal ligation.  Therefore, Avastin was discontinued. Patient had initial upper endoscopy in 05/2020 which were small, banding was not performed.  He underwent upper endoscopy when he was  hospitalized in December 2022 secondary to upper GI bleed, underwent esophageal variceal ligation x2.  Patient underwent repeat upper endoscopy for variceal surveillance in 04/2021 which revealed significantly small varices with scarring from prior banding was present.  Therefore, variceal ligation was not performed. Patient is doing well, denies any symptoms of melena, nausea, upper abdominal pain, reflux.  His most recent hemoglobin is 11.  He is also planned to receive Avastin.  Patient's wife wants to know about further management of Barrett's esophagus.  Patient is currently not on PPI.  Patient could not swallow Prilosec capsules in the past  NSAIDs: None  Antiplts/Anticoagulants/Anti thrombotics: None  GI Procedures:  Upper endoscopy 05/21/2020 - Normal duodenal bulb and second portion of the duodenum. - Erosive gastropathy with stigmata of recent bleeding. - Esophageal mucosal changes consistent with long-segment Barrett's esophagus. Biopsied. - Grade I esophageal varices.  Upper endoscopy 02/24/2021 - Normal duodenal bulb, first portion of the duodenum and second portion of the duodenum. - Small hiatal hernia. - Normal mucosa was found in the stomach. - Esophageal mucosal changes secondary to established long-segment Barrett's disease, classified as Barrett's stage C8-M10 per Prague criteria. - Grade II esophageal varices. Banded x 2. - LA Grade A esophagitis with no bleeding. - The examination was otherwise normal. - No specimens collected.  Upper endoscopy 04/29/2021 - Normal duodenal bulb and second portion of the duodenum. - Normal stomach. - Small (< 5 mm) esophageal varices with no bleeding and no stigmata of recent bleeding, significant scarring from prior banding was present. - Esophageal mucosal changes consistent with long-segment Barrett's esophagus, biopsies not performed. - No specimens collected.   Past Medical History:  Diagnosis Date  Barrett esophagus    CAD  (coronary artery disease)    Cancer ([facility])    liver cancer   Cirrhosis ([facility])    Heart disease    Hereditary hemochromatosis ([place])     Past Surgical History:  Procedure Laterality Date   CORONARY ARTERY BYPASS GRAFT     at 65 years old   ESOPHAGOGASTRODUODENOSCOPY (EGD) WITH PROPOFOL N/A 05/21/2020   Procedure: ESOPHAGOGASTRODUODENOSCOPY (EGD) WITH PROPOFOL;  Surgeon: Lin Landsman, MD;  Location: [facility];  Service: Gastroenterology;  Laterality: N/A;   ESOPHAGOGASTRODUODENOSCOPY (EGD) WITH PROPOFOL N/A 02/24/2021   Procedure: ESOPHAGOGASTRODUODENOSCOPY (EGD) WITH PROPOFOL;  Surgeon: Annamaria Helling, DO;  Location: [facility] ENDOSCOPY;  Service: Gastroenterology;  Laterality: N/A;   ESOPHAGOGASTRODUODENOSCOPY (EGD) WITH PROPOFOL N/A 04/29/2021   Procedure: ESOPHAGOGASTRODUODENOSCOPY (EGD) WITH PROPOFOL;  Surgeon: Lin Landsman, MD;  Location: [facility] ENDOSCOPY;  Service: Gastroenterology;  Laterality: N/A;   LAPAROSCOPIC LYSIS OF ADHESIONS  06/12/2020   Procedure: LAPAROSCOPIC LYSIS OF ADHESIONS;  Surgeon: Jules Husbands, MD;  Location: [facility] ORS;  Service: General;;   PORTA CATH INSERTION N/A 04/30/2020   Procedure: PORTA CATH INSERTION;  Surgeon: Algernon Huxley, MD;  Location: [facility];  Service: Cardiovascular;  Laterality: N/A;   VEIN BYPASS SURGERY      Current Outpatient Medications:    esomeprazole (NEXIUM) 40 MG packet, Take 40 mg by mouth 2 (two) times daily before a meal., Disp: 3 g, Rfl: 2   nadolol (CORGARD) 20 MG tablet, Take 1 tablet (20 mg total) by mouth daily., Disp: 30 tablet, Rfl: 3    Family History  Problem Relation Age of Onset   Dementia Mother    Ulcers Father    Heart attack Brother    Throat cancer Brother    Heart attack Daughter    Diabetes Daughter    Depression Daughter      Social History   Tobacco Use   Smoking status: Former    Types: Cigarettes   Smokeless tobacco: Never  Vaping Use   Vaping Use: Never used   Substance Use Topics   Alcohol use: Not Currently   Drug use: Not Currently    Allergies as of 05/06/2021   (No Known Allergies)    Imaging Studies: Reviewed  Assessment and Plan:   Maxwell Edwards is a 66 y.o. male with history of decompensated cirrhosis of liver secondary to variceal bleed, hepatocellular carcinoma, s/p treatment with Tecentriq and Avastin, Avastin has been discontinued due to esophageal variceal bleeding s/p EGD in 12/22 with ligation.  Esophageal varices with history of bleeding, s/p ligation in 12/22 EGD in 2/23 revealed small varices and scarring from prior banding, significantly reduced in size With the decompensation, recommend to start nadolol 20 mg daily and titrate as patient tolerates.  Reviewed side effects with patient's wife Recommend repeat EGD in 6 months  Long segment Barrett's esophagus Discussed with patient's wife regarding further management of long segment Barrett's esophagus including high-dose PPI therapy, radiofrequency ablation.  For this, I recommend referral to tertiary care center to GI expert in management of Barrett's esophagus and patient's wife agreed.  Will refer patient to Dr. Rickard Patience at [facility] Patient did not tolerate omeprazole capsules in the past.  Will try Nexium 40 mg suspension twice daily before meals.  Check renal function in 1 month  Hereditary hemochromatosis Continue serial phlebotomies to maintain ferritin within normal range    Follow Up Instructions:   I discussed the assessment and treatment plan with  the patient. The patient was provided an opportunity to ask questions and all were answered. The patient agreed with the plan and demonstrated an understanding of the instructions.   The patient was advised to call back or seek an in-person evaluation if the symptoms worsen or if the condition fails to improve as anticipated.  I provided 25 minutes of face-to-face time during this encounter.   Follow up in 6  months   Cephas Darby, MD

## 2021-05-10 ENCOUNTER — Encounter: Payer: Self-pay | Admitting: Oncology

## 2021-05-10 ENCOUNTER — Other Ambulatory Visit: Payer: Self-pay

## 2021-05-10 ENCOUNTER — Inpatient Hospital Stay: Payer: Medicare Other | Attending: Oncology

## 2021-05-10 ENCOUNTER — Inpatient Hospital Stay: Payer: Medicare Other

## 2021-05-10 ENCOUNTER — Other Ambulatory Visit: Payer: Self-pay | Admitting: Oncology

## 2021-05-10 DIAGNOSIS — Z79899 Other long term (current) drug therapy: Secondary | ICD-10-CM | POA: Insufficient documentation

## 2021-05-10 DIAGNOSIS — C220 Liver cell carcinoma: Secondary | ICD-10-CM | POA: Diagnosis present

## 2021-05-10 DIAGNOSIS — Z5112 Encounter for antineoplastic immunotherapy: Secondary | ICD-10-CM | POA: Insufficient documentation

## 2021-05-10 LAB — HEMOGLOBIN: Hemoglobin: 10.9 g/dL — ABNORMAL LOW (ref 13.0–17.0)

## 2021-05-10 LAB — HEMATOCRIT: HCT: 34.2 % — ABNORMAL LOW (ref 39.0–52.0)

## 2021-05-10 MED ORDER — SODIUM CHLORIDE 0.9% FLUSH
10.0000 mL | Freq: Once | INTRAVENOUS | Status: AC
  Administered 2021-05-10: 10 mL via INTRAVENOUS
  Filled 2021-05-10: qty 10

## 2021-05-10 MED ORDER — HEPARIN SOD (PORK) LOCK FLUSH 100 UNIT/ML IV SOLN
500.0000 [IU] | Freq: Once | INTRAVENOUS | Status: AC
  Administered 2021-05-10: 500 [IU] via INTRAVENOUS
  Filled 2021-05-10: qty 5

## 2021-05-10 NOTE — Progress Notes (Signed)
Hemoglobin 10.9 this am. No phlebotomy indicated. Pt discharged to home. ?

## 2021-05-15 ENCOUNTER — Encounter: Payer: Self-pay | Admitting: Licensed Clinical Social Worker

## 2021-05-15 NOTE — Progress Notes (Signed)
[facility] CSW Progress Note ? ?Clinical Social Worker contacted caregiver by phone to follow-up on questions about medication assistance grant.  CSW spoke with patient's primary caregiver/spouse Ms. Harper who stated she has already spoken with someone about the grant but could not remember who it was.  CSW stated I would send email to our Designer, jewellery.  CSW encouraged Ms. [place] to contact CSW if she had any further questions or concerns.  Ms, Walthall verbalized understanding. ? ?CSW contact Steve Rattler.  Ms. Tamala Julian confirmed she spoke with Ms. Grieves and had made new [id number] grant application for patient. ? ? ? ?Shivaay Stormont , LCSW ?

## 2021-05-17 ENCOUNTER — Encounter: Payer: Self-pay | Admitting: Oncology

## 2021-05-17 ENCOUNTER — Telehealth: Payer: Self-pay | Admitting: *Deleted

## 2021-05-17 ENCOUNTER — Inpatient Hospital Stay: Payer: Medicare Other

## 2021-05-17 ENCOUNTER — Inpatient Hospital Stay (HOSPITAL_BASED_OUTPATIENT_CLINIC_OR_DEPARTMENT_OTHER): Payer: Medicare Other | Admitting: Oncology

## 2021-05-17 ENCOUNTER — Other Ambulatory Visit: Payer: Self-pay

## 2021-05-17 ENCOUNTER — Other Ambulatory Visit: Payer: Self-pay | Admitting: *Deleted

## 2021-05-17 DIAGNOSIS — C220 Liver cell carcinoma: Secondary | ICD-10-CM

## 2021-05-17 DIAGNOSIS — Z79899 Other long term (current) drug therapy: Secondary | ICD-10-CM | POA: Diagnosis not present

## 2021-05-17 DIAGNOSIS — Z5112 Encounter for antineoplastic immunotherapy: Secondary | ICD-10-CM

## 2021-05-17 LAB — COMPREHENSIVE METABOLIC PANEL
ALT: 45 U/L — ABNORMAL HIGH (ref 0–44)
AST: 58 U/L — ABNORMAL HIGH (ref 15–41)
Albumin: 2.9 g/dL — ABNORMAL LOW (ref 3.5–5.0)
Alkaline Phosphatase: 601 U/L — ABNORMAL HIGH (ref 38–126)
Anion gap: 7 (ref 5–15)
BUN: 17 mg/dL (ref 8–23)
CO2: 25 mmol/L (ref 22–32)
Calcium: 8.8 mg/dL — ABNORMAL LOW (ref 8.9–10.3)
Chloride: 99 mmol/L (ref 98–111)
Creatinine, Ser: 0.71 mg/dL (ref 0.61–1.24)
GFR, Estimated: >60 mL/min (ref >60)
Glucose, Bld: 105 mg/dL — ABNORMAL HIGH (ref 70–99)
Potassium: 4.2 mmol/L (ref 3.5–5.1)
Sodium: 131 mmol/L — ABNORMAL LOW (ref 135–145)
Total Bilirubin: 0.5 mg/dL (ref 0.3–1.2)
Total Protein: 8.1 g/dL (ref 6.5–8.1)

## 2021-05-17 LAB — CBC WITH DIFFERENTIAL/PLATELET
Abs Immature Granulocytes: 0.08 10*3/uL — ABNORMAL HIGH (ref 0.00–0.07)
Basophils Absolute: 0.1 10*3/uL (ref 0.0–0.1)
Basophils Relative: 1 %
Eosinophils Absolute: 0.1 10*3/uL (ref 0.0–0.5)
Eosinophils Relative: 1 %
HCT: 36 % — ABNORMAL LOW (ref 39.0–52.0)
Hemoglobin: 11.4 g/dL — ABNORMAL LOW (ref 13.0–17.0)
Immature Granulocytes: 1 %
Lymphocytes Relative: 16 %
Lymphs Abs: 1.5 10*3/uL (ref 0.7–4.0)
MCH: 26.4 pg (ref 26.0–34.0)
MCHC: 31.7 g/dL (ref 30.0–36.0)
MCV: 83.3 fL (ref 80.0–100.0)
Monocytes Absolute: 1 10*3/uL (ref 0.1–1.0)
Monocytes Relative: 11 %
Neutro Abs: 6.6 10*3/uL (ref 1.7–7.7)
Neutrophils Relative %: 70 %
Platelets: 212 10*3/uL (ref 150–400)
RBC: 4.32 MIL/uL (ref 4.22–5.81)
RDW: 17.1 % — ABNORMAL HIGH (ref 11.5–15.5)
WBC: 9.4 10*3/uL (ref 4.0–10.5)
nRBC: 0 % (ref 0.0–0.2)

## 2021-05-17 LAB — PROTEIN, URINE, RANDOM: Total Protein, Urine: 51 mg/dL

## 2021-05-17 MED ORDER — HEPARIN SOD (PORK) LOCK FLUSH 100 UNIT/ML IV SOLN
500.0000 [IU] | Freq: Once | INTRAVENOUS | Status: AC
  Filled 2021-05-17: qty 5

## 2021-05-17 MED ORDER — SODIUM CHLORIDE 0.9 % IV SOLN
Freq: Once | INTRAVENOUS | Status: AC
  Filled 2021-05-17: qty 250

## 2021-05-17 MED ORDER — SODIUM CHLORIDE 0.9% FLUSH
10.0000 mL | Freq: Once | INTRAVENOUS | Status: AC
  Administered 2021-05-17: 10 mL via INTRAVENOUS
  Filled 2021-05-17: qty 10

## 2021-05-17 MED ORDER — SODIUM CHLORIDE 0.9 % IV SOLN
1000.0000 mg | Freq: Once | INTRAVENOUS | Status: AC
  Administered 2021-05-17: 1000 mg via INTRAVENOUS
  Filled 2021-05-17: qty 8

## 2021-05-17 MED ORDER — HEPARIN SOD (PORK) LOCK FLUSH 100 UNIT/ML IV SOLN
INTRAVENOUS | Status: AC
  Administered 2021-05-17: 500 [IU] via INTRAVENOUS
  Filled 2021-05-17: qty 5

## 2021-05-17 MED ORDER — SODIUM CHLORIDE 0.9 % IV SOLN
1200.0000 mg | Freq: Once | INTRAVENOUS | Status: AC
  Administered 2021-05-17: 1200 mg via INTRAVENOUS
  Filled 2021-05-17: qty 20

## 2021-05-17 NOTE — Patient Instructions (Signed)
[facility]   ?Discharge Instructions: ?Thank you for choosing [facility] to provide your oncology and hematology care.  ?If you have a lab appointment with the [facility], please go directly to the [facility] and check in at the registration area. ?  ?Wear comfortable clothing and clothing appropriate for easy access to any Portacath or PICC line.  ? ?We strive to give you quality time with your provider. You may need to reschedule your appointment if you arrive late (15 or more minutes).  Arriving late affects you and other patients whose appointments are after yours.  Also, if you miss three or more appointments without notifying the office, you may be dismissed from the clinic at the provider?s discretion.    ?  ?For prescription refill requests, have your pharmacy contact our office and allow 72 hours for refills to be completed.   ? ?Today you received the following chemotherapy and/or immunotherapy agents: Mvasi and Tecentriq.    ?  ?To help prevent nausea and vomiting after your treatment, we encourage you to take your nausea medication as directed. ? ?BELOW ARE SYMPTOMS THAT SHOULD BE REPORTED IMMEDIATELY: ?*FEVER GREATER THAN 100.4 F (38 ?C) OR HIGHER ?*CHILLS OR SWEATING ?*NAUSEA AND VOMITING THAT IS NOT CONTROLLED WITH YOUR NAUSEA MEDICATION ?*UNUSUAL SHORTNESS OF BREATH ?*UNUSUAL BRUISING OR BLEEDING ?*URINARY PROBLEMS (pain or burning when urinating, or frequent urination) ?*BOWEL PROBLEMS (unusual diarrhea, constipation, pain near the anus) ?TENDERNESS IN MOUTH AND THROAT WITH OR WITHOUT PRESENCE OF ULCERS (sore throat, sores in mouth, or a toothache) ?UNUSUAL RASH, SWELLING OR PAIN  ?UNUSUAL VAGINAL DISCHARGE OR ITCHING  ? ?Items with * indicate a potential emergency and should be followed up as soon as possible or go to the Emergency Department if any problems should occur. ? ?Please show the CHEMOTHERAPY ALERT CARD or IMMUNOTHERAPY ALERT CARD  at check-in to the Emergency Department and triage nurse. ? ?Should you have questions after your visit or need to cancel or reschedule your appointment, please contact [facility]  Dept: [phone]  and follow the prompts.  Office hours are 8:00 a.m. to 4:30 p.m. Monday - Friday. Please note that voicemails left after 4:00 p.m. may not be returned until the following business day.  We are closed weekends and major holidays. You have access to a nurse at all times for urgent questions. Please call the main number to the clinic Dept: [phone] and follow the prompts. ? ?For any non-urgent questions, you may also contact your provider using MyChart. We now offer e-Visits for anyone 66 and older to request care online for non-urgent symptoms. For details visit [email or web address]. ?  ?Also download the MyChart app! Go to the app store, search "MyChart", open the app, select [facility], and log in with your MyChart username and password. ? ?Due to Covid, a mask is required upon entering the hospital/clinic. If you do not have a mask, one will be given to you upon arrival. For doctor visits, patients may have 1 support person aged 66 or older with them. For treatment visits, patients cannot have anyone with them due to current Covid guidelines and our immunocompromised population.  ?

## 2021-05-17 NOTE — Telephone Encounter (Signed)
Patient is starting back on avastin and pt needs a random protein urine. Dr. Janese Banks feels that he has been off the avastin for several months. We will get a portein random urine but the staff do not need to wait for results in order to start chemo. The results of today will be used for next treatment. Maxwell Edwards is the RN today for treatment and has been told that also. ?

## 2021-05-17 NOTE — Progress Notes (Signed)
Hematology/Oncology Consult note [facility]  [phone] [phone]  Patient Care Team: Venita Lick, NP as PCP - General (Nurse Practitioner) Clent Jacks, RN as Oncology Nurse Navigator Sindy Guadeloupe, MD as Consulting Physician (Hematology and Oncology) Lin Landsman, MD as Consulting Physician (Gastroenterology)   Name of the patient: Maxwell Edwards  [id number]  23-Nov-1955   Date of visit: 05/17/21  Diagnosis-locally advanced unresectable hepatocellular carcinoma  Chief complaint/ Reason for visit-on treatment assessment prior to cycle 18 of Tecentriq and Avastin  Heme/Onc history: patient is a 66 year old male with a past medical history significant for hypertension hyperlipidemia, 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  Reports that he has lost about 20 pounds over the last 2 to 3 months. He is a chronic smoker which prompted a CT chest.  CT chest without contrast on 04/06/2020 showed no evidence of mediastinal or hilar adenopathy.  He was found to have 4 subcentimeter lung nodules 3 in the right lobe and one in the left upper lobe which were borderline for metastatic disease.  Cirrhotic liver morphology with 8.3 x 7 cm ill-defined hypoattenuating lesion in the left liver with a 3.3 cm lesion in the dome of the liver near the junction of the right and left hepatic lobes.  Labs showed elevated AST and ALT of 62 and 47 respectively with a normal albumin and normal total bilirubin.  CMP showed elevated H&H of 18.3/53.8.  Hepatitis C and HIV testing was negative. 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  Another mass in the segment 4A measuring 3.4 x 2.9 cm.  No  evidence of local regional adenopathy.   Alpha-fetoprotein elevated at 38,000.  CA 19-9 elevated at 147.Patient's case discussed at tumor board and consensus was that this would be consistent with [facility] especially given the AFP value.  Initially MRI was not characteristic for [facility] but a biopsy was not deemed necessary after AFP results were back   Avastin Tecentriq started in March 2022.  He is also getting weekly phlebotomy for iron overload   Patient developed significant variceal bleeding in December 2022 and Avastin was on hold.  He had a repeat EGD in February 2023 which showed small nonbleeding varices and otherwise was cleared by GI to resume Avastin  Interval history- Patient states that he has a good appetite but feels like he is losing weight.  His weight has remained stable around 140 pounds over the last 2 months.  Denies any new complaints at this time.  Remains independent of his ADLs  ECOG PS- 1 Pain scale- 0   Review of systems- Review of Systems  Constitutional:  Positive for malaise/fatigue. Negative for chills, fever and weight loss.  HENT:  Negative for congestion, ear discharge and nosebleeds.   Eyes:  Negative for blurred vision.  Respiratory:  Negative for cough, hemoptysis, sputum production, shortness of breath and wheezing.   Cardiovascular:  Negative for chest pain, palpitations, orthopnea and claudication.  Gastrointestinal:  Negative for abdominal pain, blood in stool, constipation, diarrhea, heartburn, melena, nausea and vomiting.  Genitourinary:  Negative for dysuria, flank pain, frequency, hematuria and urgency.  Musculoskeletal:  Negative for back pain, joint pain and myalgias.  Skin:  Negative for rash.  Neurological:  Negative for dizziness, tingling, focal weakness, seizures, weakness and headaches.  Endo/Heme/Allergies:  Does not bruise/bleed easily.  Psychiatric/Behavioral:  Negative for depression and suicidal ideas. The patient does not have insomnia.       No Known Allergies   Past Medical History:  Diagnosis Date   Acute blood loss anemia 02/23/2021   Barrett esophagus    CAD (coronary artery disease)    Cancer (HCC)    liver cancer   Cirrhosis (HCC)    GI bleed 02/24/2021   Heart disease    Hereditary hemochromatosis ([place])      Past Surgical History:  Procedure Laterality Date   CORONARY ARTERY BYPASS GRAFT     at 66 years old   ESOPHAGOGASTRODUODENOSCOPY (EGD) WITH PROPOFOL N/A 05/21/2020   Procedure: ESOPHAGOGASTRODUODENOSCOPY (EGD) WITH PROPOFOL;  Surgeon: Lin Landsman, MD;  Location: [place];  Service: Gastroenterology;  Laterality: N/A;   ESOPHAGOGASTRODUODENOSCOPY (EGD) WITH PROPOFOL N/A 02/24/2021   Procedure: ESOPHAGOGASTRODUODENOSCOPY (EGD) WITH PROPOFOL;  Surgeon: Annamaria Helling, DO;  Location: [facility] ENDOSCOPY;  Service: Gastroenterology;  Laterality: N/A;   ESOPHAGOGASTRODUODENOSCOPY (EGD) WITH PROPOFOL N/A 04/29/2021   Procedure: ESOPHAGOGASTRODUODENOSCOPY (EGD) WITH PROPOFOL;  Surgeon: Lin Landsman, MD;  Location: [facility] ENDOSCOPY;  Service: Gastroenterology;  Laterality: N/A;   LAPAROSCOPIC LYSIS OF ADHESIONS  06/12/2020   Procedure: LAPAROSCOPIC LYSIS OF ADHESIONS;  Surgeon: Jules Husbands, MD;  Location: [facility] ORS;  Service: General;;   PORTA CATH INSERTION N/A 04/30/2020   Procedure: PORTA CATH INSERTION;  Surgeon: Algernon Huxley, MD;  Location: [place] CV LAB;  Service: Cardiovascular;  Laterality: N/A;   VEIN BYPASS SURGERY      Social History   Socioeconomic History   Marital status: Divorced    Spouse name: Not on file   Number of children: 2   Years of education: Not on file   Highest education level: Not on file  Occupational History   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[employer] Use   Smoking status: Former    Types: Cigarettes   Smokeless tobacco: Never  Scientific laboratory technician Use: Never used  Substance and Sexual Activity   Alcohol use: Not  Currently   Drug use: Not Currently   Sexual activity: Not Currently  Other Topics Concern   Not on file  Social History Narrative   Lives by himself    Social Determinants of Health   Financial Resource Strain: Not on file  Food Insecurity: Not on file  Transportation Needs: Not on file  Physical Activity: Not on file  Stress: Not on file  Social Connections: Not on file  Intimate Partner Violence: Not on file    Family History  Problem Relation Age of Onset   Dementia Mother    Ulcers Father    Heart attack Brother    Throat cancer Brother    Heart attack Daughter    Diabetes Daughter    Depression Daughter      Current Outpatient Medications:    esomeprazole (NEXIUM) 40 MG packet, Take 40 mg by mouth 2 (two) times daily before a meal., Disp: 3 g, Rfl: 2   lidocaine-prilocaine (EMLA) cream, Apply topically daily., Disp: , Rfl:    nadolol (CORGARD) 20 MG tablet, Take 1 tablet (20 mg total) by mouth daily., Disp: 30 tablet, Rfl: 3   folic acid (FOLVITE) 1 MG tablet, Take 2 mg by mouth daily. (Patient not taking:  Reported on 05/17/2021), Disp: , Rfl:   Physical exam:  Vitals:   05/17/21 0920  BP: 105/70  Pulse: (!) 58  Resp: 16  Temp: 98.6 F (37 C)  SpO2: 100%  Weight: 140 lb 3.2 oz (63.6 kg)   Physical Exam Constitutional:      General: He is not in acute distress. Cardiovascular:     Rate and Rhythm: Normal rate and regular rhythm.     Heart sounds: Normal heart sounds.  Pulmonary:     Effort: Pulmonary effort is normal.     Breath sounds: Normal breath sounds.  Abdominal:     General: Bowel sounds are normal.     Palpations: Abdomen is soft.  Musculoskeletal:     Right lower leg: No edema.     Left lower leg: No edema.  Skin:    General: Skin is warm and dry.  Neurological:     Mental Status: He is alert and oriented to person, place, and time.     CMP Latest Ref Rng & Units 05/17/2021  Glucose 70 - 99 mg/dL 105(H)  BUN 8 - 23 mg/dL 17   Creatinine 0.61 - 1.24 mg/dL 0.71  Sodium 135 - 145 mmol/L 131(L)  Potassium 3.5 - 5.1 mmol/L 4.2  Chloride 98 - 111 mmol/L 99  CO2 22 - 32 mmol/L 25  Calcium 8.9 - 10.3 mg/dL 8.8(L)  Total Protein 6.5 - 8.1 g/dL 8.1  Total Bilirubin 0.3 - 1.2 mg/dL 0.5  Alkaline Phos 38 - 126 U/L 601(H)  AST 15 - 41 U/L 58(H)  ALT 0 - 44 U/L 45(H)   CBC Latest Ref Rng & Units 05/17/2021  WBC 4.0 - 10.5 K/uL 9.4  Hemoglobin 13.0 - 17.0 g/dL 11.4(L)  Hematocrit 39.0 - 52.0 % 36.0(L)  Platelets 150 - 400 K/uL 212    No images are attached to the encounter.  No results found.   Assessment and plan- Patient is a 66 y.o. male with locally advanced hepatocellular carcinoma and had to treat hemochromatosis here for on treatment assessment prior to next cycle ofPalliative Tecentriq and Avastin  Counts okay to proceed with cycle 18 of palliative Tecentriq today.  I am also resuming a Avastin at this time since he had a repeat EGD and was cleared by GI to proceed with a Avastin.  His hemoglobin is remained stable around 11 and is not dropping.  Therefore not indicative of any acute bleeding.  aFP from today is pending.  He has repeat scans scheduled for next week.  I will see him back in 3 weeks for cycle 19 of Tecentriq and Avastin.  Blood pressure is stable and urine protein is otherwise trace to proceed with a Avastin today.  Hereditary hemochromatosis: I will put his phlebotomy on hold.  Repeat ferritin in 3 weeks from now and decide about resuming phlebotomy based on his levels.  Abnormal LFTs: Possibly combination of hemochromatosis and underlying hepatocellular carcinoma.  Continue to monitor   Visit Diagnosis 1. Hereditary hemochromatosis ([place])   2. Encounter for monoclonal antibody treatment for malignancy   3. Encounter for antineoplastic immunotherapy   4. Cancer, hepatocellular ([place])      Dr. Randa Evens, MD, MPH [facility] [phone] 05/17/2021 12:44  PM

## 2021-05-17 NOTE — Progress Notes (Signed)
Nutrition Follow-up: ? ?Patient with advanced hepatocellular cancer.  Patient receiving avastin and tecentriq ? ?Met with patient during infusion.  Patient reports that appetite is good.  "I snack all the time."  Last night for dinner ate pork chop and mashed potatoes with gravy.  Continues to drink ensure shakes.  Lunch is usually a sandwich.   ? ?Noted hospital admission for GI bleed.   ? ? ? ?Medications: reviewed ? ?Labs: reviewed ? ?Anthropometrics:  ? ?Weight 140 lb 4.2 oz today ? ?146 lb 9/30 ?143 lb 8/19 ?134 lb on 6/17 ? ? ?NUTRITION DIAGNOSIS: Inadequate oral intake improving ? ? ?INTERVENTION:  ?Continue oral nutrition supplement at least BID. Coupons given today ?Continue high calorie, high protein foods.  Reviewed with patient today.  ?  ? ?MONITORING, EVALUATION, GOAL: weight trends, intake ? ? ?NEXT VISIT: as needed ? ?Johnthan Axtman B. Zenia Resides, RD, LDN ?Registered Dietitian ?[phone] (mobile) ? ? ?

## 2021-05-17 NOTE — Progress Notes (Signed)
random ?

## 2021-05-18 ENCOUNTER — Encounter: Payer: Self-pay | Admitting: Gastroenterology

## 2021-05-18 LAB — AFP TUMOR MARKER: AFP, Serum, Tumor Marker: 1043 ng/mL — ABNORMAL HIGH (ref 0.0–8.4)

## 2021-05-21 ENCOUNTER — Encounter: Payer: Self-pay | Admitting: Oncology

## 2021-05-27 ENCOUNTER — Encounter: Payer: Self-pay | Admitting: Oncology

## 2021-05-28 ENCOUNTER — Ambulatory Visit
Admission: RE | Admit: 2021-05-28 | Discharge: 2021-05-28 | Disposition: A | Discharge status: Home / Self Care | Payer: Medicare Other | Source: Ambulatory Visit | Attending: Oncology | Admitting: Oncology

## 2021-05-28 ENCOUNTER — Encounter: Payer: Self-pay | Admitting: Oncology

## 2021-05-28 DIAGNOSIS — C220 Liver cell carcinoma: Secondary | ICD-10-CM | POA: Diagnosis not present

## 2021-05-28 DIAGNOSIS — I700 Atherosclerosis of aorta: Secondary | ICD-10-CM | POA: Diagnosis not present

## 2021-05-28 DIAGNOSIS — I2510 Atherosclerotic heart disease of native coronary artery without angina pectoris: Secondary | ICD-10-CM | POA: Diagnosis not present

## 2021-05-28 DIAGNOSIS — C7951 Secondary malignant neoplasm of bone: Secondary | ICD-10-CM | POA: Diagnosis not present

## 2021-05-28 MED ORDER — IOHEXOL 300 MG/ML  SOLN
100.0000 mL | Freq: Once | INTRAMUSCULAR | Status: AC | PRN
  Administered 2021-05-28: 100 mL via INTRAVENOUS

## 2021-06-07 ENCOUNTER — Ambulatory Visit: Payer: Medicare Other

## 2021-06-07 ENCOUNTER — Other Ambulatory Visit: Payer: Medicare Other

## 2021-06-07 ENCOUNTER — Ambulatory Visit: Payer: Medicare Other | Admitting: Oncology

## 2021-06-09 ENCOUNTER — Encounter: Payer: Self-pay | Admitting: Oncology

## 2021-06-10 ENCOUNTER — Other Ambulatory Visit (HOSPITAL_COMMUNITY): Payer: Self-pay

## 2021-06-10 ENCOUNTER — Telehealth: Payer: Self-pay | Admitting: Pharmacy Technician

## 2021-06-10 ENCOUNTER — Inpatient Hospital Stay (HOSPITAL_BASED_OUTPATIENT_CLINIC_OR_DEPARTMENT_OTHER): Payer: Medicare Other | Admitting: Oncology

## 2021-06-10 ENCOUNTER — Inpatient Hospital Stay: Payer: Medicare Other | Attending: Internal Medicine

## 2021-06-10 ENCOUNTER — Encounter: Payer: Self-pay | Admitting: Pharmacy Technician

## 2021-06-10 ENCOUNTER — Encounter: Payer: Self-pay | Admitting: Oncology

## 2021-06-10 ENCOUNTER — Inpatient Hospital Stay: Payer: Medicare Other

## 2021-06-10 DIAGNOSIS — Z7189 Other specified counseling: Secondary | ICD-10-CM

## 2021-06-10 DIAGNOSIS — Z79899 Other long term (current) drug therapy: Secondary | ICD-10-CM

## 2021-06-10 DIAGNOSIS — C220 Liver cell carcinoma: Secondary | ICD-10-CM

## 2021-06-10 LAB — COMPREHENSIVE METABOLIC PANEL
ALT: 56 U/L — ABNORMAL HIGH (ref 0–44)
AST: 73 U/L — ABNORMAL HIGH (ref 15–41)
Albumin: 3.2 g/dL — ABNORMAL LOW (ref 3.5–5.0)
Alkaline Phosphatase: 616 U/L — ABNORMAL HIGH (ref 38–126)
Anion gap: 9 (ref 5–15)
BUN: 14 mg/dL (ref 8–23)
CO2: 25 mmol/L (ref 22–32)
Calcium: 8.8 mg/dL — ABNORMAL LOW (ref 8.9–10.3)
Chloride: 97 mmol/L — ABNORMAL LOW (ref 98–111)
Creatinine, Ser: 0.81 mg/dL (ref 0.61–1.24)
GFR, Estimated: >60 mL/min (ref >60)
Glucose, Bld: 185 mg/dL — ABNORMAL HIGH (ref 70–99)
Potassium: 3.9 mmol/L (ref 3.5–5.1)
Sodium: 131 mmol/L — ABNORMAL LOW (ref 135–145)
Total Bilirubin: 0.5 mg/dL (ref 0.3–1.2)
Total Protein: 7.8 g/dL (ref 6.5–8.1)

## 2021-06-10 LAB — CBC WITH DIFFERENTIAL/PLATELET
Abs Immature Granulocytes: 0.03 10*3/uL (ref 0.00–0.07)
Basophils Absolute: 0 10*3/uL (ref 0.0–0.1)
Basophils Relative: 1 %
Eosinophils Absolute: 0.1 10*3/uL (ref 0.0–0.5)
Eosinophils Relative: 1 %
HCT: 39.6 % (ref 39.0–52.0)
Hemoglobin: 12.5 g/dL — ABNORMAL LOW (ref 13.0–17.0)
Immature Granulocytes: 1 %
Lymphocytes Relative: 18 %
Lymphs Abs: 1.1 10*3/uL (ref 0.7–4.0)
MCH: 26.2 pg (ref 26.0–34.0)
MCHC: 31.6 g/dL (ref 30.0–36.0)
MCV: 83 fL (ref 80.0–100.0)
Monocytes Absolute: 0.5 10*3/uL (ref 0.1–1.0)
Monocytes Relative: 9 %
Neutro Abs: 4.5 10*3/uL (ref 1.7–7.7)
Neutrophils Relative %: 70 %
Platelets: 155 10*3/uL (ref 150–400)
RBC: 4.77 MIL/uL (ref 4.22–5.81)
RDW: 17.8 % — ABNORMAL HIGH (ref 11.5–15.5)
WBC: 6.2 10*3/uL (ref 4.0–10.5)
nRBC: 0 % (ref 0.0–0.2)

## 2021-06-10 LAB — PROTEIN, URINE, RANDOM: Total Protein, Urine: 68 mg/dL

## 2021-06-10 LAB — MAGNESIUM: Magnesium: 2.4 mg/dL (ref 1.7–2.4)

## 2021-06-10 LAB — FERRITIN: Ferritin: 83 ng/mL (ref 24–336)

## 2021-06-10 LAB — TSH: TSH: 1.833 u[IU]/mL (ref 0.350–4.500)

## 2021-06-10 MED ORDER — HEPARIN SOD (PORK) LOCK FLUSH 100 UNIT/ML IV SOLN
500.0000 [IU] | Freq: Once | INTRAVENOUS | Status: AC
  Administered 2021-06-10: 500 [IU] via INTRAVENOUS
  Filled 2021-06-10: qty 5

## 2021-06-10 MED ORDER — SODIUM CHLORIDE 0.9% FLUSH
10.0000 mL | Freq: Once | INTRAVENOUS | Status: DC
  Filled 2021-06-10: qty 10

## 2021-06-10 MED ORDER — CABOZANTINIB S-MALATE 3 X 20 MG PO KIT: 60.0000 mg | PACK | Freq: Every day | ORAL | Status: DC

## 2021-06-10 MED ORDER — SODIUM CHLORIDE 0.9 % IV SOLN
Freq: Once | INTRAVENOUS | Status: AC
  Filled 2021-06-10: qty 250

## 2021-06-10 NOTE — Progress Notes (Signed)
? ? ? ?Hematology/Oncology Consult note ?Maxwell Edwards  ?[phone] [phone] ? ?Patient Care Team: ?Venita Lick, NP as PCP - General (Nurse Practitioner) ?Clent Jacks, RN as Oncology Nurse Navigator ?Sindy Guadeloupe, MD as Consulting Physician (Hematology and Oncology) ?Lin Landsman, MD as Consulting Physician (Gastroenterology)  ? ?Name of the patient: Maxwell Edwards  ?[id number]  ?[birth year]  ? ?Date of visit: 06/10/21 ? ?Diagnosis-metastatic hepatocellular carcinoma ? ?Chief complaint/ Reason for visit-discuss CT scan results and further management ? 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  Reports that he has lost about 20 pounds over the last 2 to 3 months. He is a chronic smoker which prompted a CT chest.  CT chest without contrast on 04/06/2020 showed no evidence of mediastinal or hilar adenopathy.  He was found to have 4 subcentimeter lung nodules 3 in the right lobe and one in the left upper lobe which were borderline for metastatic disease.  Cirrhotic liver morphology with 8.3 x 7 cm ill-defined hypoattenuating lesion in the left liver with a 3.3 cm lesion in the dome of the liver near the junction of the right and left hepatic lobes.  Labs showed elevated AST and ALT of 62 and 47 respectively with a normal albumin and normal total bilirubin.  CMP showed elevated H&H of 18.3/53.8.  Hepatitis C and HIV testing was negative. 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  Another mass in the segment 4A measuring 3.4 x 2.9 cm.  No evidence of local regional adenopathy. ?   ?Alpha-fetoprotein elevated at 38,000.  CA 19-9 elevated at 147.Patient's case discussed at tumor board and consensus was that this would be consistent with [facility] especially given the AFP value.  Initially MRI was not characteristic for [facility] but a biopsy was not deemed necessary after AFP results were back ?  ?Avastin Tecentriq started in March 2022.  He is also getting weekly phlebotomy for iron overload ?  ?Patient developed significant variceal bleeding in December 2022 and Avastin was on hold.  He had a repeat EGD in February 2023 which showed small nonbleeding varices and otherwise was cleared by GI to resume Avastin ? ?Interval history-patient is here with his ex-wife Maxwell Edwards today.  He reports some ongoing fatigueBut remains independent of his ADLs.  Appetite and weight have remained stable.  He was prescribed Nexium but he did not tolerate it and does not take that anymore. ? ?ECOG PS- 1 ?Pain scale- 0 ? ? ?Review of systems- Review of Systems  ?Constitutional:  Positive for malaise/fatigue. Negative for chills, fever and weight loss.  ?HENT:  Negative for congestion, ear discharge and nosebleeds.   ?Eyes:  Negative for blurred vision.  ?Respiratory:  Negative for cough, hemoptysis, sputum production, shortness of breath and wheezing.   ?Cardiovascular:  Negative for chest pain, palpitations, orthopnea and claudication.  ?Gastrointestinal:  Negative for abdominal pain, blood in stool, constipation, diarrhea, heartburn, melena, nausea and vomiting.  ?Genitourinary:  Negative for dysuria, flank pain, frequency, hematuria and urgency.  ?Musculoskeletal:  Negative for back pain, joint pain and myalgias.  ?Skin:  Negative for rash.  ?  Neurological:  Negative for dizziness, tingling, focal weakness, seizures, weakness and headaches.  ?Endo/Heme/Allergies:  Does not bruise/bleed easily.  ?Psychiatric/Behavioral:  Negative for depression and suicidal ideas. The patient does not have insomnia.    ? ? ? ?No Known  Allergies ? ? ?Past Medical History:  ?Diagnosis Date  ? Acute blood loss anemia 02/23/2021  ? Barrett esophagus   ? CAD (coronary artery disease)   ? [facility])   ? liver cancer  ? Cirrhosis ([place])   ? GI bleed 02/24/2021  ? Heart disease   ? Hereditary hemochromatosis ([place])   ? ? ? ?Past Surgical History:  ?Procedure Laterality Date  ? CORONARY ARTERY BYPASS GRAFT    ? at 66 years old  ? ESOPHAGOGASTRODUODENOSCOPY (EGD) WITH PROPOFOL N/A 05/21/2020  ? Procedure: ESOPHAGOGASTRODUODENOSCOPY (EGD) WITH PROPOFOL;  Surgeon: Lin Landsman, MD;  Location: [facility] ENDOSCOPY;  Service: Gastroenterology;  Laterality: N/A;  ? ESOPHAGOGASTRODUODENOSCOPY (EGD) WITH PROPOFOL N/A 02/24/2021  ? Procedure: ESOPHAGOGASTRODUODENOSCOPY (EGD) WITH PROPOFOL;  Surgeon: Annamaria Helling, DO;  Location: [facility] ENDOSCOPY;  Service: Gastroenterology;  Laterality: N/A;  ? ESOPHAGOGASTRODUODENOSCOPY (EGD) WITH PROPOFOL N/A 04/29/2021  ? Procedure: ESOPHAGOGASTRODUODENOSCOPY (EGD) WITH PROPOFOL;  Surgeon: Lin Landsman, MD;  Location: [facility] ENDOSCOPY;  Service: Gastroenterology;  Laterality: N/A;  ? LAPAROSCOPIC LYSIS OF ADHESIONS  06/12/2020  ? Procedure: LAPAROSCOPIC LYSIS OF ADHESIONS;  Surgeon: Jules Husbands, MD;  Location: [facility] ORS;  Service: General;;  ? PORTA CATH INSERTION N/A 04/30/2020  ? Procedure: PORTA CATH INSERTION;  Surgeon: Algernon Huxley, MD;  Location: [place] CV LAB;  Service: Cardiovascular;  Laterality: N/A;  ? VEIN BYPASS SURGERY    ? ? ?Social History  ? ?Socioeconomic History  ? Marital status: Divorced  ?  Spouse name: Not on file  ? Number of children: 2  ? Years of education: Not on file  ? Highest education level: Not on file  ?Occupational History  ? Occupation: retired  ?  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 Smoking status: Former  ?  Types: Cigarettes  ? Smokeless tobacco: Never  ?Vaping Use  ? Vaping Use: Never used  ?Substance and Sexual Activity  ? Alcohol use: Not Currently  ? Drug  use: Not Currently  ? Sexual activity: Not Currently  ?Other Topics Concern  ? Not on file  ?Social History Narrative  ? Lives by himself   ? ?Social Determinants of Health  ? ?Financial Resource Strain: Not on file  ?Food Insecurity: Not on file  ?Transportation Needs: Not on file  ?Physical Activity: Not on file  ?Stress: Not on file  ?Social Connections: Not on file  ?Intimate Partner Violence: Not on file  ? ? ?Family History  ?Problem Relation Age of Onset  ? Dementia Mother   ? Ulcers Father   ? Heart attack Brother   ? Throat cancer Brother   ? Heart attack Daughter   ? Diabetes Daughter   ? Depression Daughter   ? ? ? ?Current Outpatient Medications:  ?  cabozantinib s-malate (COMETRIQ) capsule (3 x 76m kit), Take 60 mg by mouth daily., Disp: 1 kit, Rfl:  ?  folic acid (FOLVITE) 1 MG tablet, Take 2 mg by mouth daily., Disp: , Rfl:  ?  lidocaine-prilocaine (EMLA) cream, Apply topically daily., Disp: , Rfl:  ?  esomeprazole (NEXIUM) 40 MG packet, Take 40 mg by mouth 2 (two) times daily before a meal. (Patient not taking: Reported on 06/10/2021), Disp: 3 g,  Rfl: 2 ?  nadolol (CORGARD) 20 MG tablet, Take 1 tablet (20 mg total) by mouth daily., Disp: 30 tablet, Rfl: 3 ?No current facility-administered medications for this visit. ? ?Facility-Administered Medications Ordered in Other Visits:  ?  sodium chloride flush (NS) 0.9 % injection 10 mL, 10 mL, Intravenous, Once, Sindy Guadeloupe, MD ? ?Physical exam:  ?Vitals:  ? 06/10/21 0900  ?BP: 122/80  ?Pulse: 82  ?Resp: 16  ?Temp: 98.2 ?F (36.8 ?C)  ?Weight: 137 lb 3.2 oz (62.2 kg)  ? ?Physical Exam ?Constitutional:   ?   General: He is not in acute distress. ?Cardiovascular:  ?   Rate and Rhythm: Normal rate and regular rhythm.  ?   Heart sounds: Normal heart sounds.  ?Pulmonary:  ?   Effort: Pulmonary effort is normal.  ?   Breath sounds: Normal breath sounds.  ?Skin: ?   General: Skin is warm and dry.  ?Neurological:  ?   Mental Status: He is alert and oriented to  person, place, and time.  ?  ? ? ?  Latest Ref Rng & Units 06/10/2021  ?  9:00 AM  ?CMP  ?Glucose 70 - 99 mg/dL 185    ?BUN 8 - 23 mg/dL 14    ?Creatinine 0.61 - 1.24 mg/dL 0.81    ?Sodium 135 - 145 mmol/L 131    ?Potassium

## 2021-06-10 NOTE — Telephone Encounter (Signed)
Oral Oncology Patient Advocate Encounter ?  ?Received notification from [facility] that prior authorization for Cabometyx is required. ?  ?PA submitted on CoverMyMeds ?Key [id number] ?Status is pending ?  ?Oral Oncology Clinic will continue to follow. ? ?Dennison Nancy CPHT ?Specialty Pharmacy Patient Advocate ?[place] ?Phone [phone] ?Fax [phone] ?06/14/2021 2:30 PM ? ? ? ?

## 2021-06-10 NOTE — Progress Notes (Signed)
Patient stopped taking Nexium because it made him "feel worse" including difficulty breathing. ?

## 2021-06-11 ENCOUNTER — Encounter: Payer: Self-pay | Admitting: Oncology

## 2021-06-11 LAB — AFP TUMOR MARKER: AFP, Serum, Tumor Marker: 401 ng/mL — ABNORMAL HIGH (ref 0.0–8.4)

## 2021-06-11 NOTE — Telephone Encounter (Signed)
Opened in error

## 2021-06-12 ENCOUNTER — Telehealth: Payer: Self-pay | Admitting: Pharmacist

## 2021-06-12 ENCOUNTER — Encounter: Payer: Self-pay | Admitting: Oncology

## 2021-06-12 DIAGNOSIS — C220 Liver cell carcinoma: Secondary | ICD-10-CM

## 2021-06-12 NOTE — Telephone Encounter (Signed)
error 

## 2021-06-13 ENCOUNTER — Other Ambulatory Visit (HOSPITAL_COMMUNITY): Payer: Self-pay

## 2021-06-13 MED ORDER — CABOZANTINIB S-MALATE 60 MG PO TABS
60.0000 mg | ORAL_TABLET | Freq: Every day | ORAL | 0 refills | Status: DC
  Filled 2021-06-13 – 2021-06-14 (×2): qty 30, 30d supply, fill #0

## 2021-06-13 NOTE — Telephone Encounter (Signed)
Oral Oncology Pharmacy Student Encounter ? ?Received new prescription for cabozantinib (Cabometyx) for the treatment of metastatic hepatocellular carcinoma, planned duration until disease progression or unacceptable toxicity. ? ?CBC, CMP, TSH, Mg, urine protein from 06/10/21 assessed. LFTs were trending up 2/2 cancer progression; proteinuria was noted on the baseline, will continue to monitor. Prescription dose and frequency assessed.  ? ?Current medication list in Epic reviewed, no DDIs identified. ? ?Evaluated chart and no patient barriers to medication adherence identified.  ? ?Prescription has been e-scribed to the [facility] for benefits analysis and approval. ? ?Oral Oncology Clinic will continue to follow for insurance authorization, copayment issues, initial counseling and start date. ? ?Patient agreed to treatment on 06/10/21 per MD documentation. ? ?Adele Dan, PharmD Candidate ?Class of 2023 ?[place]/[facility]/AP Oral Chemotherapy Navigation Clinic ?[phone] ? ?06/13/2021 12:03 PM  ?

## 2021-06-14 ENCOUNTER — Other Ambulatory Visit (HOSPITAL_COMMUNITY): Payer: Self-pay

## 2021-06-17 ENCOUNTER — Other Ambulatory Visit (HOSPITAL_COMMUNITY): Payer: Self-pay

## 2021-06-17 ENCOUNTER — Inpatient Hospital Stay: Payer: Medicare Other | Admitting: Pharmacist

## 2021-06-17 DIAGNOSIS — C220 Liver cell carcinoma: Secondary | ICD-10-CM

## 2021-06-17 MED ORDER — ONDANSETRON HCL 8 MG PO TABS: 8.0000 mg | ORAL_TABLET | Freq: Two times a day (BID) | ORAL | 0 refills | Status: DC | PRN

## 2021-06-17 MED ORDER — PROCHLORPERAZINE MALEATE 10 MG PO TABS: 10.0000 mg | ORAL_TABLET | Freq: Four times a day (QID) | ORAL | 0 refills | Status: AC | PRN

## 2021-06-17 NOTE — Progress Notes (Signed)
? ?Oral Chemotherapy Clinic- Virtual Visit ?[place]  ?Telephone:([phone]) [phone] [phone] ? ?Patient Care Team: ?Venita Lick, NP as PCP - General (Nurse Practitioner) ?Clent Jacks, RN as Oncology Nurse Navigator ?Sindy Guadeloupe, MD as Consulting Physician (Hematology and Oncology) ?Lin Landsman, MD as Consulting Physician (Gastroenterology)  ? ?Name of the patient: Maxwell Edwards  ?[id number]  ?1955/04/15  ? ?Date of visit: 06/17/21 ? ?Virtual visit: I connected with  Maxwell Edwards on 06/17/21 at 11:00 AM EDT by a video enabled telemedicine application and verified that I am speaking with the correct person using two identifiers. I discussed the limitations of evaluation and management by telemedicine and the availability of in person appointments. The patient expressed understanding and agreed to proceed. ?Locations: Patient: at home , Provider: in office ? ?HPI: Patient is a 66 y.o. male with progressive metastatic heptocellular carcinoma. Planned treatment with Cabometyx (cabozantinib).  ? ?Reason for Consult: Cabometxy (cabozantinib) oral chemotherapy education. ? ? ?PAST MEDICAL HISTORY: ?Past Medical History:  ?Diagnosis Date  ? Acute blood loss anemia 02/23/2021  ? Barrett esophagus   ? CAD (coronary artery disease)   ? Cancer [facility])   ? liver cancer  ? Cirrhosis ([place])   ? GI bleed 02/24/2021  ? Heart disease   ? Hereditary hemochromatosis ([place])   ? ? ?HEMATOLOGY/ONCOLOGY HISTORY:  ?Oncology History  ?Cancer, hepatocellular ([place])  ?04/20/2020 Cancer Staging  ? Staging form: Liver, AJCC 8th Edition ?- Clinical stage from 04/20/2020: Stage IIIB (cT4, cN0, cM0) - Signed by Sindy Guadeloupe, MD on 04/21/2020 ? ?  ?04/21/2020 Initial Diagnosis  ? Cancer, hepatocellular ([place]) ?  ?05/10/2020 -  Chemotherapy  ? Patient is on Treatment Plan : Liver Atezolizumab + Bevacizumab q21d  ?   ? ? ?ALLERGIES:  has No Known Allergies. ? ?MEDICATIONS:  ?Current Outpatient Medications   ?Medication Sig Dispense Refill  ? lactulose (CHRONULAC) 10 GM/15ML solution Take by mouth daily.    ? ondansetron (ZOFRAN) 8 MG tablet Take 1 tablet (8 mg total) by mouth 2 (two) times daily as needed for nausea or vomiting. Take 30-60 mins prior to the Cabometyx. 40 tablet 0  ? prochlorperazine (COMPAZINE) 10 MG tablet Take 1 tablet (10 mg total) by mouth every 6 (six) hours as needed for nausea or vomiting. 30 tablet 0  ? cabozantinib (CABOMETYX) 60 MG tablet Take 1 tablet (60 mg total) by mouth daily. Take on an empty stomach, 1 hour before or 2 hours after meals. 30 tablet 0  ? ?No current facility-administered medications for this visit.  ? ? ?VITAL SIGNS: ?There were no vitals taken for this visit. ?There were no vitals filed for this visit.  ?Estimated body mass index is 20.26 kg/m? as calculated from the following: ?  Height as of 04/29/21: '5\' 9"'$  (1.753 m). ?  Weight as of 06/10/21: 62.2 kg (137 lb 3.2 oz). ? ?LABS: ?CBC: ?   ?Component Value Date/Time  ? WBC 6.2 06/10/2021 0900  ? HGB 12.5 (L) 06/10/2021 0900  ? HGB 18.3 (H) 04/06/2020 1152  ? HCT 39.6 06/10/2021 0900  ? HCT 53.8 (H) 04/06/2020 1152  ? PLT 155 06/10/2021 0900  ? PLT 247 04/06/2020 1152  ? MCV 83.0 06/10/2021 0900  ? MCV 94 04/06/2020 1152  ? NEUTROABS 4.5 06/10/2021 0900  ? NEUTROABS 7.1 (H) 04/06/2020 1152  ? LYMPHSABS 1.1 06/10/2021 0900  ? LYMPHSABS 2.1 04/06/2020 1152  ? MONOABS 0.5 06/10/2021 0900  ? EOSABS 0.1 06/10/2021 0900  ?  EOSABS 0.1 04/06/2020 1152  ? BASOSABS 0.0 06/10/2021 0900  ? BASOSABS 0.1 04/06/2020 1152  ? ?Comprehensive Metabolic Panel: ?   ?Component Value Date/Time  ? NA 131 (L) 06/10/2021 0900  ? NA 135 04/06/2020 1152  ? K 3.9 06/10/2021 0900  ? CL 97 (L) 06/10/2021 0900  ? CO2 25 06/10/2021 0900  ? BUN 14 06/10/2021 0900  ? BUN 15 04/06/2020 1152  ? CREATININE 0.81 06/10/2021 0900  ? GLUCOSE 185 (H) 06/10/2021 0900  ? CALCIUM 8.8 (L) 06/10/2021 0900  ? AST 73 (H) 06/10/2021 0900  ? ALT 56 (H) 06/10/2021 0900  ?  ALKPHOS 616 (H) 06/10/2021 0900  ? BILITOT 0.5 06/10/2021 0900  ? BILITOT 0.6 04/06/2020 1152  ? PROT 7.8 06/10/2021 0900  ? PROT 8.2 04/06/2020 1152  ? ALBUMIN 3.2 (L) 06/10/2021 0900  ? ALBUMIN 4.3 04/06/2020 1152  ? ? ? ?Present during today's visit: patient and partner Maxwell Edwards ? ?Start plan: He will begin taking his cabozantinib tomorrow 06/18/21 ?  ?Patient Education ?I spoke with patient for overview of new oral chemotherapy medication: cabozantinib  ? ?Administration: ?Counseled patient on administration, dosing, side effects, monitoring, drug-food interactions, safe handling, storage, and disposal. ?Patient will take 1 tablet (60 mg total) by mouth daily. Take on an empty stomach, 1 hour before or 2 hours after meals. ? ?Side Effects: ?Side effects include but not limited to: diarrhea or constipations, fatigue, nausea, hand-foot syndrome, mouth sores, hypertension, decreased wbc/hgb/plt.   ?Hand-foot syndrome: recommended the patient keep his hand and feet moisturized. Recommended [place] Smooth Extra Care 20 ?Hypertenion: Patient has a blood pressure cuff at home. Recommended he check his blood pressure daily for the time being and any time he has headache or dizziness. BP log mailed to patient ?Nausea: prescription sent in for Maxwell Edwards to take ondansetron 30-60 mins prior to cabozantinib and prochlorperazine for prn use  ? ?Drug-drug Interactions (DDI): ?No current DDIs ? ?Adherence: ?After discussion with patient no patient barriers to medication adherence identified.  ?Reviewed with patient importance of keeping a medication schedule and plan for any missed doses. ? ?The [facility] voiced understanding and appreciation. All questions answered. Medication handout provided. ? ?Provided patient with Oral [place] phone number. Patient knows to call the office with questions or concerns. Oral Chemotherapy Navigation Clinic will continue to follow. ? ?Patient expressed understanding and was  in agreement with this plan. He also understands that He can call clinic at any time with any questions, concerns, or complaints.  ? ?Medication Access Issues: ?No issues, patient fills at [facility], med was delivered today ? ?Follow-up plan: RTC in 1 week ? ?Thank you for allowing me to participate in the care of this patient.  ? ?Time Total: 40 mins ? ?Visit consisted of counseling and education on dealing with issues of symptom management in the setting of serious and potentially life-threatening illness.Greater than 50%  of this time was spent counseling and coordinating care related to the above assessment and plan. ? ?Signed by: ?Darl Pikes, PharmD, BCPS, BCOP, CPP ?Hematology/Oncology Clinical Pharmacist Practitioner ?[place]/DB/AP Oral Chemotherapy Navigation Clinic ?[phone] ? ?06/17/2021 2:35 PM ? ?

## 2021-06-17 NOTE — Telephone Encounter (Signed)
Oral Oncology Patient Advocate Encounter ? ?Prior Authorization for Cabometyx has been approved.   ? ?Effective dates: 06/13/21 through 06/14/22 ? ?Patients co-pay is $10.35. ? ?Oral Oncology Clinic will continue to follow.  ? ?Dennison Nancy CPHT ?Specialty Pharmacy Patient Advocate ?[place] ?Phone [phone] ?Fax [phone] ?06/17/2021 10:52 AM ? ?

## 2021-06-22 ENCOUNTER — Encounter: Payer: Self-pay | Admitting: Oncology

## 2021-06-24 ENCOUNTER — Other Ambulatory Visit: Payer: Medicare Other

## 2021-06-25 ENCOUNTER — Inpatient Hospital Stay: Payer: Medicare Other | Admitting: Pharmacist

## 2021-06-25 ENCOUNTER — Inpatient Hospital Stay: Payer: Medicare Other

## 2021-06-25 DIAGNOSIS — C220 Liver cell carcinoma: Secondary | ICD-10-CM

## 2021-06-25 DIAGNOSIS — D751 Secondary polycythemia: Secondary | ICD-10-CM

## 2021-06-25 LAB — COMPREHENSIVE METABOLIC PANEL
ALT: 90 U/L — ABNORMAL HIGH (ref 0–44)
AST: 108 U/L — ABNORMAL HIGH (ref 15–41)
Albumin: 3 g/dL — ABNORMAL LOW (ref 3.5–5.0)
Alkaline Phosphatase: 729 U/L — ABNORMAL HIGH (ref 38–126)
Anion gap: 5 (ref 5–15)
BUN: 16 mg/dL (ref 8–23)
CO2: 26 mmol/L (ref 22–32)
Calcium: 8.5 mg/dL — ABNORMAL LOW (ref 8.9–10.3)
Chloride: 101 mmol/L (ref 98–111)
Creatinine, Ser: 0.87 mg/dL (ref 0.61–1.24)
GFR, Estimated: >60 mL/min (ref >60)
Glucose, Bld: 87 mg/dL (ref 70–99)
Potassium: 4.1 mmol/L (ref 3.5–5.1)
Sodium: 132 mmol/L — ABNORMAL LOW (ref 135–145)
Total Bilirubin: 0.6 mg/dL (ref 0.3–1.2)
Total Protein: 8.2 g/dL — ABNORMAL HIGH (ref 6.5–8.1)

## 2021-06-25 LAB — CBC WITH DIFFERENTIAL/PLATELET
Abs Immature Granulocytes: 0.02 10*3/uL (ref 0.00–0.07)
Basophils Absolute: 0.1 10*3/uL (ref 0.0–0.1)
Basophils Relative: 1 %
Eosinophils Absolute: 0.1 10*3/uL (ref 0.0–0.5)
Eosinophils Relative: 1 %
HCT: 40 % (ref 39.0–52.0)
Hemoglobin: 12.6 g/dL — ABNORMAL LOW (ref 13.0–17.0)
Immature Granulocytes: 0 %
Lymphocytes Relative: 25 %
Lymphs Abs: 1.7 10*3/uL (ref 0.7–4.0)
MCH: 25.8 pg — ABNORMAL LOW (ref 26.0–34.0)
MCHC: 31.5 g/dL (ref 30.0–36.0)
MCV: 81.8 fL (ref 80.0–100.0)
Monocytes Absolute: 0.5 10*3/uL (ref 0.1–1.0)
Monocytes Relative: 7 %
Neutro Abs: 4.4 10*3/uL (ref 1.7–7.7)
Neutrophils Relative %: 66 %
Platelets: 149 10*3/uL — ABNORMAL LOW (ref 150–400)
RBC: 4.89 MIL/uL (ref 4.22–5.81)
RDW: 17.6 % — ABNORMAL HIGH (ref 11.5–15.5)
WBC: 6.8 10*3/uL (ref 4.0–10.5)
nRBC: 0 % (ref 0.0–0.2)

## 2021-06-25 LAB — PROTEIN / CREATININE RATIO, URINE
Creatinine, Urine: 95 mg/dL
Protein Creatinine Ratio: 0.38 mg/mg{Cre} — ABNORMAL HIGH (ref 0.00–0.15)
Total Protein, Urine: 36 mg/dL

## 2021-06-25 MED ORDER — HEPARIN SOD (PORK) LOCK FLUSH 100 UNIT/ML IV SOLN
500.0000 [IU] | Freq: Once | INTRAVENOUS | Status: AC
  Administered 2021-06-25: 500 [IU] via INTRAVENOUS
  Filled 2021-06-25: qty 5

## 2021-06-25 MED ORDER — SODIUM CHLORIDE 0.9% FLUSH
10.0000 mL | Freq: Once | INTRAVENOUS | Status: AC
  Administered 2021-06-25: 10 mL via INTRAVENOUS
  Filled 2021-06-25: qty 10

## 2021-06-25 MED ORDER — SODIUM CHLORIDE 0.9 % IV SOLN
Freq: Once | INTRAVENOUS | Status: AC
  Filled 2021-06-25: qty 250

## 2021-06-25 NOTE — Progress Notes (Signed)
? ?Oral Chemotherapy Clinic ?[place]  ?Telephone:([phone]) [phone] [phone] ? ?Patient Care Team: ?Venita Lick, NP as PCP - General (Nurse Practitioner) ?Clent Jacks, RN as Oncology Nurse Navigator ?Sindy Guadeloupe, MD as Consulting Physician (Hematology and Oncology) ?Lin Landsman, MD as Consulting Physician (Gastroenterology)  ? ?Name of the patient: Maxwell Edwards  ?[id number]  ?June 09, 1955  ? ?Date of visit: 06/25/21 ? ?HPI: Patient is a 66 y.o. male with progressive metastatic heptocellular carcinoma. Currrently treated with Cabometyx (cabozantinib), started on 06/18/21. ? ?Reason for Consult: Oral chemotherapy follow-up for cabozantinib therapy. ? ? ?PAST MEDICAL HISTORY: ?Past Medical History:  ?Diagnosis Date  ? Acute blood loss anemia 02/23/2021  ? Barrett esophagus   ? CAD (coronary artery disease)   ? Cancer [facility])   ? liver cancer  ? Cirrhosis ([place])   ? GI bleed 02/24/2021  ? Heart disease   ? Hereditary hemochromatosis ([place])   ? ? ?HEMATOLOGY/ONCOLOGY HISTORY:  ?Oncology History  ?Cancer, hepatocellular ([place])  ?04/20/2020 Cancer Staging  ? Staging form: Liver, AJCC 8th Edition ?- Clinical stage from 04/20/2020: Stage IIIB (cT4, cN0, cM0) - Signed by Sindy Guadeloupe, MD on 04/21/2020 ? ?  ?04/21/2020 Initial Diagnosis  ? Cancer, hepatocellular ([place]) ? ?  ?05/10/2020 -  Chemotherapy  ? Patient is on Treatment Plan : Liver Atezolizumab + Bevacizumab q21d  ? ?  ?  ? ? ?ALLERGIES:  has No Known Allergies. ? ?MEDICATIONS:  ?Current Outpatient Medications  ?Medication Sig Dispense Refill  ? cabozantinib (CABOMETYX) 60 MG tablet Take 1 tablet (60 mg total) by mouth daily. Take on an empty stomach, 1 hour before or 2 hours after meals. 30 tablet 0  ? lactulose (CHRONULAC) 10 GM/15ML solution Take by mouth daily.    ? ondansetron (ZOFRAN) 8 MG tablet Take 1 tablet (8 mg total) by mouth 2 (two) times daily as needed for nausea or vomiting. Take 30-60 mins prior to the Cabometyx. 40  tablet 0  ? prochlorperazine (COMPAZINE) 10 MG tablet Take 1 tablet (10 mg total) by mouth every 6 (six) hours as needed for nausea or vomiting. 30 tablet 0  ? ?No current facility-administered medications for this visit.  ? ? ?VITAL SIGNS: ?There were no vitals taken for this visit. ?There were no vitals filed for this visit.  ?Estimated body mass index is 20.33 kg/m? as calculated from the following: ?  Height as of 04/29/21: '5\' 9"'$  (1.753 m). ?  Weight as of an earlier encounter on 06/25/21: 62.5 kg (137 lb 11.2 oz). ? ?LABS: ?CBC: ?   ?Component Value Date/Time  ? WBC 6.8 06/25/2021 1315  ? HGB 12.6 (L) 06/25/2021 1315  ? HGB 18.3 (H) 04/06/2020 1152  ? HCT 40.0 06/25/2021 1315  ? HCT 53.8 (H) 04/06/2020 1152  ? PLT 149 (L) 06/25/2021 1315  ? PLT 247 04/06/2020 1152  ? MCV 81.8 06/25/2021 1315  ? MCV 94 04/06/2020 1152  ? NEUTROABS 4.4 06/25/2021 1315  ? NEUTROABS 7.1 (H) 04/06/2020 1152  ? LYMPHSABS 1.7 06/25/2021 1315  ? LYMPHSABS 2.1 04/06/2020 1152  ? MONOABS 0.5 06/25/2021 1315  ? EOSABS 0.1 06/25/2021 1315  ? EOSABS 0.1 04/06/2020 1152  ? BASOSABS 0.1 06/25/2021 1315  ? BASOSABS 0.1 04/06/2020 1152  ? ?Comprehensive Metabolic Panel: ?   ?Component Value Date/Time  ? NA 132 (L) 06/25/2021 1315  ? NA 135 04/06/2020 1152  ? K 4.1 06/25/2021 1315  ? CL 101 06/25/2021 1315  ? CO2 26  06/25/2021 1315  ? BUN 16 06/25/2021 1315  ? BUN 15 04/06/2020 1152  ? CREATININE 0.87 06/25/2021 1315  ? GLUCOSE 87 06/25/2021 1315  ? CALCIUM 8.5 (L) 06/25/2021 1315  ? AST 108 (H) 06/25/2021 1315  ? ALT 90 (H) 06/25/2021 1315  ? ALKPHOS 729 (H) 06/25/2021 1315  ? BILITOT 0.6 06/25/2021 1315  ? BILITOT 0.6 04/06/2020 1152  ? PROT 8.2 (H) 06/25/2021 1315  ? PROT 8.2 04/06/2020 1152  ? ALBUMIN 3.0 (L) 06/25/2021 1315  ? ALBUMIN 4.3 04/06/2020 1152  ? ? ? ?Present during today's visit: Patient and partner Max ? ?Assessment and Plan: ?Reviewed CMP and CBC, continue cabozantinib 60 mg daily. ?AST/ALT slightly increased, likely due to  disease progression. Will continue to monitor. ?  ?Oral Chemotherapy Side Effect/Intolerance:  ?Endorses mild fatigue, but patient is able to rest and complete his daily activities. ?Reports checking blood pressure daily at home with SBP 130-160s. Will continue to monitor. ?No reported constipation, diarrhea, nausea (patient takes ondansetron 8 mg 30-60 mins prior to cabozantinib), vomiting, hand-foot syndrome, mouth sore, signs and symptoms of infection. ? ?Oral Chemotherapy Adherence: 1 reported missed dose since treatment initiation (missed dose 06/24/21) ?No patient barriers to medication adherence identified.  ? ?New medications: No ? ?Medication Access Issues: No ? ?Patient expressed understanding and was in agreement with this plan. He also understands that He can call clinic at any time with any questions, concerns, or complaints.  ? ?Follow-up plan: RTC in 2 weeks ? ?Thank you for allowing me to participate in the care of this very pleasant patient.  ? ?Time Total: 15 min ? ?Visit consisted of counseling and education on dealing with issues of symptom management in the setting of serious and potentially life-threatening illness.Greater than 50%  of this time was spent counseling and coordinating care related to the above assessment and plan. ? ?Signed by: ?Darl Pikes, PharmD, BCPS, BCOP, CPP ?Hematology/Oncology Clinical Pharmacist Practitioner ?[place]/DB/AP Oral Chemotherapy Navigation Clinic ?[phone] ? ?06/25/2021 2:14 PM ?

## 2021-06-25 NOTE — Patient Instructions (Signed)

## 2021-06-25 NOTE — Progress Notes (Signed)
Hemoglobin 12.6 today. Performed therapeutic phlebotomy. 500 ml blood removed from R AC via peripheral line while 500 ml NS replacement via port access. Pt tolerated procedure well. Max, his CG reports pt has been having diminished appetite. Pt states he is having some abdominal pains at times. His energy is less. Weight remains the same. ?

## 2021-07-02 ENCOUNTER — Other Ambulatory Visit (HOSPITAL_COMMUNITY): Payer: Self-pay

## 2021-07-03 ENCOUNTER — Encounter: Payer: Self-pay | Admitting: Oncology

## 2021-07-08 ENCOUNTER — Inpatient Hospital Stay (HOSPITAL_BASED_OUTPATIENT_CLINIC_OR_DEPARTMENT_OTHER): Payer: Medicare Other | Admitting: Oncology

## 2021-07-08 ENCOUNTER — Inpatient Hospital Stay: Payer: Medicare Other | Admitting: Pharmacist

## 2021-07-08 ENCOUNTER — Inpatient Hospital Stay: Payer: Medicare Other

## 2021-07-08 ENCOUNTER — Encounter: Payer: Self-pay | Admitting: Oncology

## 2021-07-08 ENCOUNTER — Other Ambulatory Visit: Payer: Self-pay

## 2021-07-08 ENCOUNTER — Inpatient Hospital Stay: Payer: Medicare Other | Attending: Oncology

## 2021-07-08 DIAGNOSIS — Z79899 Other long term (current) drug therapy: Secondary | ICD-10-CM | POA: Diagnosis not present

## 2021-07-08 DIAGNOSIS — C220 Liver cell carcinoma: Secondary | ICD-10-CM

## 2021-07-08 DIAGNOSIS — R7989 Other specified abnormal findings of blood chemistry: Secondary | ICD-10-CM

## 2021-07-08 DIAGNOSIS — E860 Dehydration: Secondary | ICD-10-CM | POA: Insufficient documentation

## 2021-07-08 DIAGNOSIS — Z7189 Other specified counseling: Secondary | ICD-10-CM | POA: Diagnosis not present

## 2021-07-08 LAB — COMPREHENSIVE METABOLIC PANEL
ALT: 71 U/L — ABNORMAL HIGH (ref 0–44)
AST: 84 U/L — ABNORMAL HIGH (ref 15–41)
Albumin: 2.9 g/dL — ABNORMAL LOW (ref 3.5–5.0)
Alkaline Phosphatase: 673 U/L — ABNORMAL HIGH (ref 38–126)
Anion gap: 6 (ref 5–15)
BUN: 16 mg/dL (ref 8–23)
CO2: 26 mmol/L (ref 22–32)
Calcium: 8.3 mg/dL — ABNORMAL LOW (ref 8.9–10.3)
Chloride: 98 mmol/L (ref 98–111)
Creatinine, Ser: 0.77 mg/dL (ref 0.61–1.24)
GFR, Estimated: >60 mL/min (ref >60)
Glucose, Bld: 88 mg/dL (ref 70–99)
Potassium: 3.9 mmol/L (ref 3.5–5.1)
Sodium: 130 mmol/L — ABNORMAL LOW (ref 135–145)
Total Bilirubin: 1.1 mg/dL (ref 0.3–1.2)
Total Protein: 8.2 g/dL — ABNORMAL HIGH (ref 6.5–8.1)

## 2021-07-08 LAB — CBC WITH DIFFERENTIAL/PLATELET
Abs Immature Granulocytes: 0.01 10*3/uL (ref 0.00–0.07)
Basophils Absolute: 0 10*3/uL (ref 0.0–0.1)
Basophils Relative: 1 %
Eosinophils Absolute: 0.1 10*3/uL (ref 0.0–0.5)
Eosinophils Relative: 1 %
HCT: 38.4 % — ABNORMAL LOW (ref 39.0–52.0)
Hemoglobin: 12.1 g/dL — ABNORMAL LOW (ref 13.0–17.0)
Immature Granulocytes: 0 %
Lymphocytes Relative: 27 %
Lymphs Abs: 1.3 10*3/uL (ref 0.7–4.0)
MCH: 25.4 pg — ABNORMAL LOW (ref 26.0–34.0)
MCHC: 31.5 g/dL (ref 30.0–36.0)
MCV: 80.7 fL (ref 80.0–100.0)
Monocytes Absolute: 0.4 10*3/uL (ref 0.1–1.0)
Monocytes Relative: 9 %
Neutro Abs: 3.1 10*3/uL (ref 1.7–7.7)
Neutrophils Relative %: 62 %
Platelets: 105 10*3/uL — ABNORMAL LOW (ref 150–400)
RBC: 4.76 MIL/uL (ref 4.22–5.81)
RDW: 18.4 % — ABNORMAL HIGH (ref 11.5–15.5)
WBC: 5 10*3/uL (ref 4.0–10.5)
nRBC: 0 % (ref 0.0–0.2)

## 2021-07-08 LAB — FERRITIN: Ferritin: 90 ng/mL (ref 24–336)

## 2021-07-08 MED ORDER — HEPARIN SOD (PORK) LOCK FLUSH 100 UNIT/ML IV SOLN
500.0000 [IU] | Freq: Once | INTRAVENOUS | Status: AC
  Administered 2021-07-08: 500 [IU] via INTRAVENOUS
  Filled 2021-07-08: qty 5

## 2021-07-08 MED ORDER — SODIUM CHLORIDE 0.9 % IV SOLN
Freq: Once | INTRAVENOUS | Status: AC
  Filled 2021-07-08: qty 250

## 2021-07-08 NOTE — Progress Notes (Signed)
Per MD proceed with Phlebotomy today, replace with NS IVF ?

## 2021-07-08 NOTE — Progress Notes (Signed)
Pt in for follow up, reports he has zero energy.  Weight down 2 lbs since 06/25/21.  Pt states he does not have much appetite.  Drink 1-2 Ensure daily if he has available.  ?

## 2021-07-08 NOTE — Progress Notes (Signed)
Patient tolatered phlebotomy well today, performed in RAC using 20g angiocath. 500 cc removed as ordered. Replaced 500cc IVF. No concerns voiced. Snack and po fluids offered. Stable at discharge. Refused AVS.   ?

## 2021-07-08 NOTE — Patient Instructions (Signed)

## 2021-07-08 NOTE — Progress Notes (Signed)
? ? ? ?Hematology/Oncology Consult note ?[place]  ?Telephone:([phone]) [phone] [phone] ? ?Patient Care Team: ?Venita Lick, NP as PCP - General (Nurse Practitioner) ?Clent Jacks, RN as Oncology Nurse Navigator ?Sindy Guadeloupe, MD as Consulting Physician (Hematology and Oncology) ?Lin Landsman, MD as Consulting Physician (Gastroenterology)  ? ?Name of the patient: Maxwell Edwards  ?[id number]  ?Sep 20, 1955  ? ?Date of visit: 07/08/21 ? ?Diagnosis-metastatic hepatocellular carcinoma ? ?Chief complaint/ Reason for visit-routine follow-up of [place] on cabozantinib ? 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  Reports that he has lost about 20 pounds over the last 2 to 3 months. He is a chronic smoker which prompted a CT chest.  CT chest without contrast on 04/06/2020 showed no evidence of mediastinal or hilar adenopathy.  He was found to have 4 subcentimeter lung nodules 3 in the right lobe and one in the left upper lobe which were borderline for metastatic disease.  Cirrhotic liver morphology with 8.3 x 7 cm ill-defined hypoattenuating lesion in the left liver with a 3.3 cm lesion in the dome of the liver near the junction of the right and left hepatic lobes.  Labs showed elevated AST and ALT of 62 and 47 respectively with a normal albumin and normal total bilirubin.  CMP showed elevated H&H of 18.3/53.8.  Hepatitis C and HIV testing was negative. 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  Another mass in the segment 4A measuring 3.4 x 2.9 cm.  No evidence of local regional adenopathy. ?   ?Alpha-fetoprotein elevated at 38,000.  CA 19-9 elevated at 147.Patient's case discussed at tumor board and consensus was that this would be consistent with [facility] especially given the AFP value.  Initially MRI was not characteristic for [facility] but a biopsy was not deemed necessary after AFP results were back ?  ?Avastin Tecentriq started in March 2022.  He is also getting weekly phlebotomy for iron overload ?  ?Patient developed significant variceal bleeding in December 2022 and Avastin was on hold.  He had a repeat EGD in February 2023 which showed small nonbleeding varices and otherwise was cleared by GI to resume Avastin ?Disease progression based on scans in March 2023 and patient switched to second line cabozantinib ? ?Interval history-patient is tolerating his present medication well.  He does report ongoing fatigue.  Appetite has been fair to poor.  He has lost 2 more pounds as compared to last month.  States that he spends most of his day resting but is able to walk around the house and do his chores. ?ECOG PS- 1 ?Pain scale- 0 ?Opioid associated constipation- no ? ?Review of systems- Review of Systems  ?Constitutional:  Positive for malaise/fatigue and weight loss. Negative for chills and fever.  ?HENT:  Negative for congestion, ear discharge and nosebleeds.   ?Eyes:  Negative for blurred vision.  ?Respiratory:  Negative for cough, hemoptysis, sputum production, shortness of breath and wheezing.   ?Cardiovascular:  Negative for chest pain, palpitations, orthopnea and claudication.  ?Gastrointestinal:  Negative for abdominal pain, blood in stool, constipation, diarrhea, heartburn, melena, nausea and vomiting.  ?Genitourinary:  Negative for dysuria, flank pain, frequency, hematuria and urgency.  ?Musculoskeletal:  Negative for back pain, joint pain and myalgias.  ?Skin:  Negative for rash.  ?Neurological:  Negative for dizziness, tingling, focal weakness, seizures, weakness and headaches.  ?Endo/Heme/Allergies:   Does not bruise/bleed easily.  ?Psychiatric/Behavioral:  Negative for depression and suicidal ideas. The patient does not have insomnia.    ? ? ? ?No Known Allergies ? ? ?Past Medical History:  ?Diagnosis Date  ? Acute blood loss anemia 02/23/2021  ? Barrett esophagus   ? CAD (coronary artery disease)   ? Cancer [facility])   ? liver cancer  ? Cirrhosis ([place])   ? GI bleed 02/24/2021  ? Heart disease   ? Hereditary hemochromatosis ([place])   ? ? ? ?Past Surgical History:  ?Procedure Laterality Date  ? CORONARY ARTERY BYPASS GRAFT    ? at 66 years old  ? ESOPHAGOGASTRODUODENOSCOPY (EGD) WITH PROPOFOL N/A 05/21/2020  ? Procedure: ESOPHAGOGASTRODUODENOSCOPY (EGD) WITH PROPOFOL;  Surgeon: Lin Landsman, MD;  Location: [facility] ENDOSCOPY;  Service: Gastroenterology;  Laterality: N/A;  ? ESOPHAGOGASTRODUODENOSCOPY (EGD) WITH PROPOFOL N/A 02/24/2021  ? Procedure: ESOPHAGOGASTRODUODENOSCOPY (EGD) WITH PROPOFOL;  Surgeon: Annamaria Helling, DO;  Location: [facility] ENDOSCOPY;  Service: Gastroenterology;  Laterality: N/A;  ? ESOPHAGOGASTRODUODENOSCOPY (EGD) WITH PROPOFOL N/A 04/29/2021  ? Procedure: ESOPHAGOGASTRODUODENOSCOPY (EGD) WITH PROPOFOL;  Surgeon: Lin Landsman, MD;  Location: [facility] ENDOSCOPY;  Service: Gastroenterology;  Laterality: N/A;  ? LAPAROSCOPIC LYSIS OF ADHESIONS  06/12/2020  ? Procedure: LAPAROSCOPIC LYSIS OF ADHESIONS;  Surgeon: Jules Husbands, MD;  Location: [facility] ORS;  Service: General;;  ? PORTA CATH INSERTION N/A 04/30/2020  ? Procedure: PORTA CATH INSERTION;  Surgeon: Algernon Huxley, MD;  Location: [place] CV LAB;  Service: Cardiovascular;  Laterality: N/A;  ? VEIN BYPASS SURGERY    ? ? ?Social History  ? ?Socioeconomic History  ? Marital status: Divorced  ?  Spouse name: Not on file  ? Number of children: 2  ? Years of education: Not on file  ? Highest education level: Not on file  ?Occupational History  ? Occupation: retired  ?  Comment: maintenance at [employer]   ?Tobacco Use  ? Smoking status: Former  ?   Types: Cigarettes  ? Smokeless tobacco: Never  ?Vaping Use  ? Vaping Use: Never used  ?Substance and Sexual Activity  ? Alcohol use: Not Currently  ? Drug use: Not Currently  ? Sexual activity: Not Currently  ?Other Topics Concern  ? Not on file  ?Social History Narrative  ? Lives by himself   ? ?Social Determinants of Health  ? ?Financial Resource Strain: Not on file  ?Food Insecurity: Not on file  ?Transportation Needs: Not on file  ?Physical Activity: Not on file  ?Stress: Not on file  ?Social Connections: Not on file  ?Intimate Partner Violence: Not on file  ? ? ?Family History  ?Problem Relation Age of Onset  ? Dementia Mother   ? Ulcers Father   ? Heart attack Brother   ? Throat cancer Brother   ? Heart attack Daughter   ? Diabetes Daughter   ? Depression Daughter   ? ? ? ?Current Outpatient Medications:  ?  cabozantinib (CABOMETYX) 60 MG tablet, Take 1 tablet (60 mg total) by mouth daily. Take on an empty stomach, 1 hour before or 2 hours after meals., Disp: 30 tablet, Rfl: 0 ?  lactulose (CHRONULAC) 10 GM/15ML solution, Take by mouth daily., Disp: , Rfl:  ?  ondansetron (  ZOFRAN) 8 MG tablet, Take 1 tablet (8 mg total) by mouth 2 (two) times daily as needed for nausea or vomiting. Take 30-60 mins prior to the Cabometyx. (Patient not taking: Reported on 07/08/2021), Disp: 40 tablet, Rfl: 0 ?  prochlorperazine (COMPAZINE) 10 MG tablet, Take 1 tablet (10 mg total) by mouth every 6 (six) hours as needed for nausea or vomiting. (Patient not taking: Reported on 07/08/2021), Disp: 30 tablet, Rfl: 0 ? ?Physical exam:  ?Vitals:  ? 07/08/21 0949  ?BP: 133/81  ?Pulse: 79  ?Resp: 16  ?Temp: 97.9 ?F (36.6 ?C)  ?TempSrc: Tympanic  ?SpO2: 100%  ?Weight: 135 lb (61.2 kg)  ? ?Physical Exam ?Constitutional:   ?   General: He is not in acute distress. ?   Comments: Appears thin and cachectic  ?Cardiovascular:  ?   Rate and Rhythm: Normal rate and regular rhythm.  ?   Heart sounds: Normal heart sounds.  ?Pulmonary:  ?   Effort:  Pulmonary effort is normal.  ?   Breath sounds: Normal breath sounds.  ?Abdominal:  ?   General: Bowel sounds are normal.  ?   Palpations: Abdomen is soft.  ?Skin: ?   General: Skin is warm and dry.  ?Neurological:

## 2021-07-08 NOTE — Progress Notes (Signed)
? ?Oral Chemotherapy Clinic ?[place]  ?Telephone:([phone]) [phone] [phone] ? ?Patient Care Team: ?Venita Lick, NP as PCP - General (Nurse Practitioner) ?Clent Jacks, RN as Oncology Nurse Navigator ?Sindy Guadeloupe, MD as Consulting Physician (Hematology and Oncology) ?Lin Landsman, MD as Consulting Physician (Gastroenterology)  ? ?Name of the patient: Maxwell Edwards  ?[id number]  ?[birth year]  ? ?Date of visit: 07/08/21 ? ?HPI: Patient is a 66 y.o. male with progressive metastatic heptocellular carcinoma. Currrently treated with Cabometyx (cabozantinib), started on 06/18/21. ? ?Reason for Consult: Oral chemotherapy follow-up for cabozantinib therapy. ? ? ?PAST MEDICAL HISTORY: ?Past Medical History:  ?Diagnosis Date  ? Acute blood loss anemia 02/23/2021  ? Barrett esophagus   ? CAD (coronary artery disease)   ? Cancer [facility])   ? liver cancer  ? Cirrhosis ([place])   ? GI bleed 02/24/2021  ? Heart disease   ? Hereditary hemochromatosis ([place])   ? ? ?HEMATOLOGY/ONCOLOGY HISTORY:  ?Oncology History  ?Cancer, hepatocellular ([place])  ?04/20/2020 Cancer Staging  ? Staging form: Liver, AJCC 8th Edition ?- Clinical stage from 04/20/2020: Stage IIIB (cT4, cN0, cM0) - Signed by Sindy Guadeloupe, MD on 04/21/2020 ? ?  ?04/21/2020 Initial Diagnosis  ? Cancer, hepatocellular ([place]) ? ?  ?05/10/2020 -  Chemotherapy  ? Patient is on Treatment Plan : Liver Atezolizumab + Bevacizumab q21d  ? ?  ?  ? ? ?ALLERGIES:  has No Known Allergies. ? ?MEDICATIONS:  ?Current Outpatient Medications  ?Medication Sig Dispense Refill  ? cabozantinib (CABOMETYX) 60 MG tablet Take 1 tablet (60 mg total) by mouth daily. Take on an empty stomach, 1 hour before or 2 hours after meals. 30 tablet 0  ? lactulose (CHRONULAC) 10 GM/15ML solution Take by mouth daily.    ? ondansetron (ZOFRAN) 8 MG tablet Take 1 tablet (8 mg total) by mouth 2 (two) times daily as needed for nausea or vomiting. Take 30-60 mins prior to the Cabometyx.  (Patient not taking: Reported on 07/08/2021) 40 tablet 0  ? prochlorperazine (COMPAZINE) 10 MG tablet Take 1 tablet (10 mg total) by mouth every 6 (six) hours as needed for nausea or vomiting. (Patient not taking: Reported on 07/08/2021) 30 tablet 0  ? ?No current facility-administered medications for this visit.  ? ? ?VITAL SIGNS: ?There were no vitals taken for this visit. ?There were no vitals filed for this visit.  ?Estimated body mass index is 19.94 kg/m? as calculated from the following: ?  Height as of 04/29/21: '5\' 9"'$  (1.753 m). ?  Weight as of an earlier encounter on 07/08/21: 61.2 kg (135 lb). ? ?LABS: ?CBC: ?   ?Component Value Date/Time  ? WBC 5.0 07/08/2021 0939  ? HGB 12.1 (L) 07/08/2021 0939  ? HGB 18.3 (H) 04/06/2020 1152  ? HCT 38.4 (L) 07/08/2021 0939  ? HCT 53.8 (H) 04/06/2020 1152  ? PLT 105 (L) 07/08/2021 0939  ? PLT 247 04/06/2020 1152  ? MCV 80.7 07/08/2021 0939  ? MCV 94 04/06/2020 1152  ? NEUTROABS 3.1 07/08/2021 0939  ? NEUTROABS 7.1 (H) 04/06/2020 1152  ? LYMPHSABS 1.3 07/08/2021 0939  ? LYMPHSABS 2.1 04/06/2020 1152  ? MONOABS 0.4 07/08/2021 0939  ? EOSABS 0.1 07/08/2021 0939  ? EOSABS 0.1 04/06/2020 1152  ? BASOSABS 0.0 07/08/2021 0939  ? BASOSABS 0.1 04/06/2020 1152  ? ?Comprehensive Metabolic Panel: ?   ?Component Value Date/Time  ? NA 130 (L) 07/08/2021 0939  ? NA 135 04/06/2020 1152  ? K 3.9 07/08/2021  9937  ? CL 98 07/08/2021 0939  ? CO2 26 07/08/2021 0939  ? BUN 16 07/08/2021 0939  ? BUN 15 04/06/2020 1152  ? CREATININE 0.77 07/08/2021 0939  ? GLUCOSE 88 07/08/2021 0939  ? CALCIUM 8.3 (L) 07/08/2021 0939  ? AST 84 (H) 07/08/2021 0939  ? ALT 71 (H) 07/08/2021 0939  ? ALKPHOS 673 (H) 07/08/2021 1696  ? BILITOT 1.1 07/08/2021 0939  ? BILITOT 0.6 04/06/2020 1152  ? PROT 8.2 (H) 07/08/2021 7893  ? PROT 8.2 04/06/2020 1152  ? ALBUMIN 2.9 (L) 07/08/2021 0939  ? ALBUMIN 4.3 04/06/2020 1152  ? ? ? ?Present during today's visit: Patient only ? ?Assessment and Plan: ?Reviewed CMP and CBC, continue  cabozantinib 60 mg daily. ?AST/ALT slightly decreased, will continue to monitor. ?Pltc trending down, continue to monitor ?BP: Reports checking blood pressure daily at home with SBP in the 130s. Will continue to monitor. ?Endorse one headache that went away on it's own ?  ?Oral Chemotherapy Side Effect/Intolerance:  ?Fatigue: Patient continues to endorse mild fatigue, but patient is able to complete his daily activities. ?Nausea: occurs at night, has been happening for <1 week. He reports feeling a lump in throat and feeling nauseaous. Suggested patient try tums or pepcid at night as this might be reflux related. He also knows he can take his anti-emetics when this occurs ?No reported constipation, diarrhea, hand-foot syndrome, mouth sore, signs and symptoms of infection. ? ?Oral Chemotherapy Adherence: No reported missed doses ?No patient barriers to medication adherence identified.  ? ?New medications: None ? ?Medication Access Issues: No issues ? ?Patient expressed understanding and was in agreement with this plan. He also understands that He can call clinic at any time with any questions, concerns, or complaints.  ? ?Follow-up plan: RTC in 2 weeks ? ?Thank you for allowing me to participate in the care of this very pleasant patient.  ? ?Time Total: 15 min ? ?Visit consisted of counseling and education on dealing with issues of symptom management in the setting of serious and potentially life-threatening illness.Greater than 50%  of this time was spent counseling and coordinating care related to the above assessment and plan. ? ?Signed by: ?Darl Pikes, PharmD, BCPS, BCOP, CPP ?Hematology/Oncology Clinical Pharmacist Practitioner ?[place]/[facility] Oral Chemotherapy Navigation [facility] ?[phone] ? ?07/08/2021 11:16 AM ?

## 2021-07-09 ENCOUNTER — Other Ambulatory Visit (HOSPITAL_COMMUNITY): Payer: Self-pay

## 2021-07-09 LAB — AFP TUMOR MARKER: AFP, Serum, Tumor Marker: 257 ng/mL — ABNORMAL HIGH (ref 0.0–8.4)

## 2021-07-11 ENCOUNTER — Other Ambulatory Visit (HOSPITAL_COMMUNITY): Payer: Self-pay

## 2021-07-12 ENCOUNTER — Other Ambulatory Visit: Payer: Self-pay | Admitting: Oncology

## 2021-07-12 ENCOUNTER — Other Ambulatory Visit: Payer: Self-pay | Admitting: Pharmacist

## 2021-07-12 DIAGNOSIS — C220 Liver cell carcinoma: Secondary | ICD-10-CM

## 2021-07-15 ENCOUNTER — Other Ambulatory Visit (HOSPITAL_COMMUNITY): Payer: Self-pay

## 2021-07-15 MED ORDER — ONDANSETRON HCL 8 MG PO TABS
8.0000 mg | ORAL_TABLET | Freq: Two times a day (BID) | ORAL | 2 refills | Status: AC | PRN
  Filled 2021-07-15: qty 40, 20d supply, fill #0
  Filled 2021-08-26: qty 40, 20d supply, fill #1
  Filled 2021-10-18: qty 40, 20d supply, fill #2

## 2021-07-16 ENCOUNTER — Other Ambulatory Visit: Payer: Self-pay | Admitting: Oncology

## 2021-07-16 ENCOUNTER — Other Ambulatory Visit (HOSPITAL_COMMUNITY): Payer: Self-pay

## 2021-07-16 DIAGNOSIS — C220 Liver cell carcinoma: Secondary | ICD-10-CM

## 2021-07-17 ENCOUNTER — Other Ambulatory Visit (HOSPITAL_COMMUNITY): Payer: Self-pay

## 2021-07-17 ENCOUNTER — Encounter: Payer: Self-pay | Admitting: Oncology

## 2021-07-17 MED ORDER — CABOMETYX 60 MG PO TABS
60.0000 mg | ORAL_TABLET | Freq: Every day | ORAL | 0 refills | Status: DC
  Filled 2021-07-17: qty 30, 30d supply, fill #0

## 2021-07-18 ENCOUNTER — Other Ambulatory Visit (HOSPITAL_COMMUNITY): Payer: Self-pay

## 2021-07-22 ENCOUNTER — Encounter: Payer: Self-pay | Admitting: Oncology

## 2021-07-23 ENCOUNTER — Inpatient Hospital Stay: Payer: Medicare Other

## 2021-07-23 ENCOUNTER — Encounter: Payer: Self-pay | Admitting: Medical Oncology

## 2021-07-23 ENCOUNTER — Inpatient Hospital Stay (HOSPITAL_BASED_OUTPATIENT_CLINIC_OR_DEPARTMENT_OTHER): Payer: Medicare Other | Admitting: Medical Oncology

## 2021-07-23 DIAGNOSIS — C220 Liver cell carcinoma: Secondary | ICD-10-CM

## 2021-07-23 DIAGNOSIS — E860 Dehydration: Secondary | ICD-10-CM

## 2021-07-23 DIAGNOSIS — R112 Nausea with vomiting, unspecified: Secondary | ICD-10-CM | POA: Diagnosis not present

## 2021-07-23 DIAGNOSIS — R634 Abnormal weight loss: Secondary | ICD-10-CM

## 2021-07-23 LAB — CBC WITH DIFFERENTIAL/PLATELET
Abs Immature Granulocytes: 0.01 10*3/uL (ref 0.00–0.07)
Basophils Absolute: 0 10*3/uL (ref 0.0–0.1)
Basophils Relative: 1 %
Eosinophils Absolute: 0.1 10*3/uL (ref 0.0–0.5)
Eosinophils Relative: 2 %
HCT: 36.7 % — ABNORMAL LOW (ref 39.0–52.0)
Hemoglobin: 11.6 g/dL — ABNORMAL LOW (ref 13.0–17.0)
Immature Granulocytes: 0 %
Lymphocytes Relative: 27 %
Lymphs Abs: 1.2 10*3/uL (ref 0.7–4.0)
MCH: 25.7 pg — ABNORMAL LOW (ref 26.0–34.0)
MCHC: 31.6 g/dL (ref 30.0–36.0)
MCV: 81.4 fL (ref 80.0–100.0)
Monocytes Absolute: 0.3 10*3/uL (ref 0.1–1.0)
Monocytes Relative: 8 %
Neutro Abs: 2.6 10*3/uL (ref 1.7–7.7)
Neutrophils Relative %: 62 %
Platelets: 104 10*3/uL — ABNORMAL LOW (ref 150–400)
RBC: 4.51 MIL/uL (ref 4.22–5.81)
RDW: 19.6 % — ABNORMAL HIGH (ref 11.5–15.5)
WBC: 4.2 10*3/uL (ref 4.0–10.5)
nRBC: 0 % (ref 0.0–0.2)

## 2021-07-23 LAB — BASIC METABOLIC PANEL
Anion gap: 9 (ref 5–15)
BUN: 18 mg/dL (ref 8–23)
CO2: 26 mmol/L (ref 22–32)
Calcium: 8.7 mg/dL — ABNORMAL LOW (ref 8.9–10.3)
Chloride: 100 mmol/L (ref 98–111)
Creatinine, Ser: 0.87 mg/dL (ref 0.61–1.24)
GFR, Estimated: >60 mL/min (ref >60)
Glucose, Bld: 125 mg/dL — ABNORMAL HIGH (ref 70–99)
Potassium: 4.1 mmol/L (ref 3.5–5.1)
Sodium: 135 mmol/L (ref 135–145)

## 2021-07-23 MED ORDER — HEPARIN SOD (PORK) LOCK FLUSH 100 UNIT/ML IV SOLN
500.0000 [IU] | Freq: Once | INTRAVENOUS | Status: AC
  Administered 2021-07-23: 500 [IU] via INTRAVENOUS
  Filled 2021-07-23: qty 5

## 2021-07-23 MED ORDER — SODIUM CHLORIDE 0.9% FLUSH
10.0000 mL | Freq: Once | INTRAVENOUS | Status: AC
  Administered 2021-07-23: 10 mL via INTRAVENOUS
  Filled 2021-07-23: qty 10

## 2021-07-23 MED ORDER — SODIUM CHLORIDE 0.9 % IV SOLN
Freq: Once | INTRAVENOUS | Status: AC
  Filled 2021-07-23: qty 250

## 2021-07-23 NOTE — Progress Notes (Addendum)
? ? ? ?Hematology/Oncology Consult note ?[place]  ?Telephone:(336) [phone] [phone] ? ?Patient Care Team: ?Venita Lick, NP as PCP - General (Nurse Practitioner) ?Clent Jacks, RN as Oncology Nurse Navigator ?Sindy Guadeloupe, MD as Consulting Physician (Hematology and Oncology) ?Lin Landsman, MD as Consulting Physician (Gastroenterology)  ? ?Name of the patient: Maxwell Edwards  ?[id number]  ?[birth year]  ? ?Date of visit: 07/23/21 ? ?Diagnosis-metastatic hepatocellular carcinoma ? ?Chief complaint/ Reason for visit-routine follow-up of [place] on cabozantinib ? 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  Reports that he has lost about 20 pounds over the last 2 to 3 months. He is a chronic smoker which prompted a CT chest.  CT chest without contrast on 04/06/2020 showed no evidence of mediastinal or hilar adenopathy.  He was found to have 4 subcentimeter lung nodules 3 in the right lobe and one in the left upper lobe which were borderline for metastatic disease.  Cirrhotic liver morphology with 8.3 x 7 cm ill-defined hypoattenuating lesion in the left liver with a 3.3 cm lesion in the dome of the liver near the junction of the right and left hepatic lobes.  Labs showed elevated AST and ALT of 62 and 47 respectively with a normal albumin and normal total bilirubin.  CMP showed elevated H&H of 18.3/53.8.  Hepatitis C and HIV testing was negative. 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  Another mass in the segment 4A measuring 3.4 x 2.9 cm.  No evidence of local regional adenopathy. ?   ?Alpha-fetoprotein elevated at 38,000.  CA 19-9 elevated at 147.Patient's case discussed at tumor board and consensus was that this would be consistent with [facility] especially given the AFP value.  Initially MRI was not characteristic for [facility] but a biopsy was not deemed necessary after AFP results were back ?  ?Avastin Tecentriq started in March 2022.  He is also getting weekly phlebotomy for iron overload ?  ?Patient developed significant variceal bleeding in December 2022 and Avastin was on hold.  He had a repeat EGD in February 2023 which showed small nonbleeding varices and otherwise was cleared by GI to resume Avastin ?Disease progression based on scans in March 2023 and patient switched to second line cabozantinib ? ?Interval history-Patient attends visit in office with his ex- wife Max via phone. They report that this past week he has felt a bit more tired than normal and has not had much of an appetite. Maybe stomach bug. Nausea and a few episodes of vomiting up his food without diarrhea or fever. Feeling better today. Using zofran with success. Drinking about 2 ensures per day which has been all he has been taking in over the last week.  ? ?ECOG PS- 1 ?Pain scale- 0 ?Opioid associated constipation- no ? ?Review of systems- Review of Systems  ?Constitutional:  Positive for malaise/fatigue and weight loss. Negative for chills and fever.  ?HENT:  Negative for congestion, ear discharge and nosebleeds.   ?Eyes:  Negative for blurred vision.  ?Respiratory:  Negative for cough, hemoptysis, sputum production, shortness of breath and wheezing.   ?Cardiovascular:  Negative  for chest pain, palpitations, orthopnea and claudication.  ?Gastrointestinal:  Positive for nausea and vomiting. Negative for abdominal pain, blood in stool, constipation, diarrhea, heartburn and melena.  ?Genitourinary:  Negative for dysuria, flank pain, frequency, hematuria and urgency.  ?Musculoskeletal:  Negative for back pain, joint pain and  myalgias.  ?Skin:  Negative for rash.  ?Neurological:  Negative for dizziness, tingling, focal weakness, seizures, weakness and headaches.  ?Endo/Heme/Allergies:  Does not bruise/bleed easily.  ?Psychiatric/Behavioral:  Negative for depression and suicidal ideas. The patient does not have insomnia.    ? ? ? ?No Known Allergies ? ? ?Past Medical History:  ?Diagnosis Date  ? Acute blood loss anemia 02/23/2021  ? Barrett esophagus   ? CAD (coronary artery disease)   ? Cancer [facility])   ? liver cancer  ? Cirrhosis ([place])   ? GI bleed 02/24/2021  ? Heart disease   ? Hereditary hemochromatosis ([place])   ? ? ? ?Past Surgical History:  ?Procedure Laterality Date  ? CORONARY ARTERY BYPASS GRAFT    ? at 66 years old  ? ESOPHAGOGASTRODUODENOSCOPY (EGD) WITH PROPOFOL N/A 05/21/2020  ? Procedure: ESOPHAGOGASTRODUODENOSCOPY (EGD) WITH PROPOFOL;  Surgeon: Lin Landsman, MD;  Location: [facility] ENDOSCOPY;  Service: Gastroenterology;  Laterality: N/A;  ? ESOPHAGOGASTRODUODENOSCOPY (EGD) WITH PROPOFOL N/A 02/24/2021  ? Procedure: ESOPHAGOGASTRODUODENOSCOPY (EGD) WITH PROPOFOL;  Surgeon: Annamaria Helling, DO;  Location: [facility] ENDOSCOPY;  Service: Gastroenterology;  Laterality: N/A;  ? ESOPHAGOGASTRODUODENOSCOPY (EGD) WITH PROPOFOL N/A 04/29/2021  ? Procedure: ESOPHAGOGASTRODUODENOSCOPY (EGD) WITH PROPOFOL;  Surgeon: Lin Landsman, MD;  Location[facility] ENDOSCOPY;  Service: Gastroenterology;  Laterality: N/A;  ? LAPAROSCOPIC LYSIS OF ADHESIONS  06/12/2020  ? Procedure: LAPAROSCOPIC LYSIS OF ADHESIONS;  Surgeon: Jules Husbands, MD;  Location: [facility] ORS;  Service: General;;  ? PORTA CATH INSERTION N/A 04/30/2020  ? Procedure: PORTA CATH INSERTION;  Surgeon: Algernon Huxley, MD;  Location: [place] CV LAB;  Service: Cardiovascular;  Laterality: N/A;  ? VEIN BYPASS SURGERY    ? ? ?Social History  ? ?Socioeconomic History  ? Marital status: Divorced  ?  Spouse name: Not on file  ? Number of children: 2  ? Years of education: Not on file  ?  Highest education level: Not on file  ?Occupational History  ? Occupation: retired  ?  Comment: maintenance at [employer]   ?Tobacco Use  ? Smoking status: Former  ?  Types: Cigarettes  ? Smokeless tobacco: Never  ?Vaping Use  ? Vaping Use: Never used  ?Substance and Sexual Activity  ? Alcohol use: Not Currently  ? Drug use: Not Currently  ? Sexual activity: Not Currently  ?Other Topics Concern  ? Not on file  ?Social History Narrative  ? Lives by himself   ? ?Social Determinants of Health  ? ?Financial Resource Strain: Not on file  ?Food Insecurity: Not on file  ?Transportation Needs: Not on file  ?Physical Activity: Not on file  ?Stress: Not on file  ?Social Connections: Not on file  ?Intimate Partner Violence: Not on file  ? ? ?Family History  ?Problem Relation Age of Onset  ? Dementia Mother   ? Ulcers Father   ? Heart attack Brother   ? Throat cancer Brother   ? Heart attack Daughter   ? Diabetes Daughter   ? Depression Daughter   ? ? ? ?Current Outpatient Medications:  ?  cabozantinib (CABOMETYX) 60 MG tablet, Take 1 tablet (60 mg total) by mouth daily. Take on an empty stomach, 1  hour before or 2 hours after meals., Disp: 30 tablet, Rfl: 0 ?  ondansetron (ZOFRAN) 8 MG tablet, Take 1 tablet (8 mg total) by mouth 2 (two) times daily as needed for nausea or vomiting. Take 30-60 mins prior to the Cabometyx., Disp: 40 tablet, Rfl: 2 ?  lactulose (CHRONULAC) 10 GM/15ML solution, Take by mouth daily. (Patient not taking: Reported on 07/23/2021), Disp: , Rfl:  ?  prochlorperazine (COMPAZINE) 10 MG tablet, Take 1 tablet (10 mg total) by mouth every 6 (six) hours as needed for nausea or vomiting. (Patient not taking: Reported on 07/08/2021), Disp: 30 tablet, Rfl: 0 ?No current facility-administered medications for this visit. ? ?Facility-Administered Medications Ordered in Other Visits:  ?  heparin lock flush 100 unit/mL, 500 Units, Intravenous, Once, Sindy Guadeloupe, MD ? ?Physical exam:  ?There were no vitals filed for this  visit. ? ?Physical Exam ?Constitutional:   ?   General: He is not in acute distress. ?   Comments: Appears thin and cachectic  ?Cardiovascular:  ?   Rate and Rhythm: Normal rate and regular rhythm.  ?   Heart sounds:

## 2021-07-23 NOTE — Progress Notes (Signed)
Pt states that he has nausea with his med cabomctyx that he is on. He naps throughout the day and can't sleep. Also he has no appetite and he munches throughout the day. Drinks boost 1-2 a  day but not always. ?

## 2021-07-23 NOTE — Progress Notes (Signed)
Nutrition ? ?RD was planning on seeing patient in fluid clinic today although patient finished up before RD able to meet with him.   ?RD available as needed ? ?Ladarrell Cornwall B. Zenia Resides, RD, LDN ?Registered Dietitian ?[phone] ? ?

## 2021-07-23 NOTE — Progress Notes (Signed)
Hemoglobin 11.6 ; Pt feeling fatigued. Had an episode of sudden vomiting at dinner last night. Spoke to Dr Janese Banks who said OK to hold phlebotomy today. Giving 500 ml of NS for hydration. ?

## 2021-08-07 ENCOUNTER — Inpatient Hospital Stay: Payer: Medicare Other

## 2021-08-07 ENCOUNTER — Encounter: Payer: Self-pay | Admitting: Oncology

## 2021-08-07 ENCOUNTER — Inpatient Hospital Stay (HOSPITAL_BASED_OUTPATIENT_CLINIC_OR_DEPARTMENT_OTHER): Payer: Medicare Other | Admitting: Oncology

## 2021-08-07 DIAGNOSIS — C220 Liver cell carcinoma: Secondary | ICD-10-CM | POA: Diagnosis not present

## 2021-08-07 DIAGNOSIS — Z79899 Other long term (current) drug therapy: Secondary | ICD-10-CM | POA: Diagnosis not present

## 2021-08-07 DIAGNOSIS — R7989 Other specified abnormal findings of blood chemistry: Secondary | ICD-10-CM | POA: Diagnosis not present

## 2021-08-07 DIAGNOSIS — E860 Dehydration: Secondary | ICD-10-CM | POA: Diagnosis not present

## 2021-08-07 LAB — CBC
HCT: 38.3 % — ABNORMAL LOW (ref 39.0–52.0)
Hemoglobin: 12.2 g/dL — ABNORMAL LOW (ref 13.0–17.0)
MCH: 26.2 pg (ref 26.0–34.0)
MCHC: 31.9 g/dL (ref 30.0–36.0)
MCV: 82.4 fL (ref 80.0–100.0)
Platelets: 122 10*3/uL — ABNORMAL LOW (ref 150–400)
RBC: 4.65 MIL/uL (ref 4.22–5.81)
RDW: 21.2 % — ABNORMAL HIGH (ref 11.5–15.5)
WBC: 5.1 10*3/uL (ref 4.0–10.5)
nRBC: 0 % (ref 0.0–0.2)

## 2021-08-07 LAB — COMPREHENSIVE METABOLIC PANEL
ALT: 77 U/L — ABNORMAL HIGH (ref 0–44)
AST: 98 U/L — ABNORMAL HIGH (ref 15–41)
Albumin: 2.8 g/dL — ABNORMAL LOW (ref 3.5–5.0)
Alkaline Phosphatase: 745 U/L — ABNORMAL HIGH (ref 38–126)
Anion gap: 6 (ref 5–15)
BUN: 20 mg/dL (ref 8–23)
CO2: 27 mmol/L (ref 22–32)
Calcium: 8.4 mg/dL — ABNORMAL LOW (ref 8.9–10.3)
Chloride: 103 mmol/L (ref 98–111)
Creatinine, Ser: 0.73 mg/dL (ref 0.61–1.24)
GFR, Estimated: >60 mL/min (ref >60)
Glucose, Bld: 98 mg/dL (ref 70–99)
Potassium: 3.7 mmol/L (ref 3.5–5.1)
Sodium: 136 mmol/L (ref 135–145)
Total Bilirubin: 0.7 mg/dL (ref 0.3–1.2)
Total Protein: 8.4 g/dL — ABNORMAL HIGH (ref 6.5–8.1)

## 2021-08-07 LAB — FERRITIN: Ferritin: 113 ng/mL (ref 24–336)

## 2021-08-07 LAB — IRON AND TIBC
Iron: 46 ug/dL (ref 45–182)
Saturation Ratios: 13 % — ABNORMAL LOW (ref 17.9–39.5)
TIBC: 343 ug/dL (ref 250–450)
UIBC: 297 ug/dL

## 2021-08-07 MED ORDER — HEPARIN SOD (PORK) LOCK FLUSH 100 UNIT/ML IV SOLN
500.0000 [IU] | Freq: Once | INTRAVENOUS | Status: AC
  Administered 2021-08-07: 500 [IU] via INTRAVENOUS
  Filled 2021-08-07: qty 5

## 2021-08-07 MED ORDER — SODIUM CHLORIDE 0.9 % IV SOLN
Freq: Once | INTRAVENOUS | Status: AC
  Filled 2021-08-07: qty 250

## 2021-08-07 MED ORDER — SODIUM CHLORIDE 0.9% FLUSH
10.0000 mL | Freq: Once | INTRAVENOUS | Status: AC
  Administered 2021-08-07: 10 mL via INTRAVENOUS
  Filled 2021-08-07: qty 10

## 2021-08-07 NOTE — Progress Notes (Signed)
No phlebotomy today per Dr Janese Banks. IV hydration with 1 L NS. Discharged at completion.

## 2021-08-07 NOTE — Progress Notes (Signed)
Hematology/Oncology Consult note [facility]  [phone] [phone]  Patient Care Team: Venita Lick, NP as PCP - General (Nurse Practitioner) Clent Jacks, RN as Oncology Nurse Navigator Sindy Guadeloupe, MD as Consulting Physician (Hematology and Oncology) Lin Landsman, MD as Consulting Physician (Gastroenterology)   Name of the patient: Maxwell Edwards  [id number]  1955-10-08   Date of visit: 08/07/21  Diagnosis- metastatic hepatocellular carcinoma    Chief complaint/ Reason for visit-routine follow-up of hepatocellular carcinoma on cabozantinib  Heme/Onc history:  patient is a 66 year old male with a past medical history significant for hypertension hyperlipidemia, 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  Reports that he has lost about 20 pounds over the last 2 to 3 months. He is a chronic smoker which prompted a CT chest.  CT chest without contrast on 04/06/2020 showed no evidence of mediastinal or hilar adenopathy.  He was found to have 4 subcentimeter lung nodules 3 in the right lobe and one in the left upper lobe which were borderline for metastatic disease.  Cirrhotic liver morphology with 8.3 x 7 cm ill-defined hypoattenuating lesion in the left liver with a 3.3 cm lesion in the dome of the liver near the junction of the right and left hepatic lobes.  Labs showed elevated AST and ALT of 62 and 47 respectively with a normal albumin and normal total bilirubin.  CMP showed elevated H&H of 18.3/53.8.  Hepatitis C and HIV testing was negative. 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  Another mass in the segment 4A measuring 3.4 x 2.9 cm.  No evidence of local regional  adenopathy.   Alpha-fetoprotein elevated at 38,000.  CA 19-9 elevated at 147.Patient's case discussed at tumor board and consensus was that this would be consistent with [facility] especially given the AFP value.  Initially MRI was not characteristic for [facility] but a biopsy was not deemed necessary after AFP results were back   Avastin Tecentriq started in March 2022.  He is also getting weekly phlebotomy for iron overload   Patient developed significant variceal bleeding in December 2022 and Avastin was on hold.  He had a repeat EGD in February 2023 which showed small nonbleeding varices and otherwise was cleared by GI to resume Avastin Disease progression based on scans in March 2023 and patient switched to second line cabozantinib  Interval history-patient reports ongoing fatigue.  Appetite is fair.  He is able to take care of his ADLs but does not really get out of the house to do any other chores.  States that he is compliant with cabozantinib and has not missed any doses.  ECOG PS- 2 Pain scale- 0   Review of systems- Review of Systems  Constitutional:  Positive for malaise/fatigue. Negative for chills, fever and weight loss.       Lack of appetite  HENT:  Negative for congestion, ear discharge and nosebleeds.   Eyes:  Negative for blurred vision.  Respiratory:  Negative for cough, hemoptysis, sputum production, shortness of breath and wheezing.   Cardiovascular:  Negative for chest pain, palpitations, orthopnea and claudication.  Gastrointestinal:  Negative for abdominal pain, blood in stool, constipation, diarrhea, heartburn, melena, nausea and  vomiting.  Genitourinary:  Negative for dysuria, flank pain, frequency, hematuria and urgency.  Musculoskeletal:  Negative for back pain, joint pain and myalgias.  Skin:  Negative for rash.  Neurological:  Negative for dizziness, tingling, focal weakness, seizures, weakness and headaches.  Endo/Heme/Allergies:  Does not bruise/bleed easily.   Psychiatric/Behavioral:  Negative for depression and suicidal ideas. The patient does not have insomnia.       No Known Allergies   Past Medical History:  Diagnosis Date   Acute blood loss anemia 02/23/2021   Barrett esophagus    CAD (coronary artery disease)    Cancer (HCC)    liver cancer   Cirrhosis (HCC)    GI bleed 02/24/2021   Heart disease    Hereditary hemochromatosis ([place])      Past Surgical History:  Procedure Laterality Date   CORONARY ARTERY BYPASS GRAFT     at 66 years old   ESOPHAGOGASTRODUODENOSCOPY (EGD) WITH PROPOFOL N/A 05/21/2020   Procedure: ESOPHAGOGASTRODUODENOSCOPY (EGD) WITH PROPOFOL;  Surgeon: Lin Landsman, MD;  Location: [place];  Service: Gastroenterology;  Laterality: N/A;   ESOPHAGOGASTRODUODENOSCOPY (EGD) WITH PROPOFOL N/A 02/24/2021   Procedure: ESOPHAGOGASTRODUODENOSCOPY (EGD) WITH PROPOFOL;  Surgeon: Annamaria Helling, DO;  Location[facility] END[facility]COPY;  Service: Gastroenterology;  Laterality: N/A;   ESOPHAGOGASTRODUODENOSCOPY (EGD) WITH PROPOFOL N/A 04/29/2021   Procedure: ESOPHAGOGASTRODUODENOSCOPY (EGD) WITH PROPOFOL;  Surgeon: Lin Landsman, MD;  Location: [facility] ENDOSCOPY;  Service: Gastroenterology;  Laterality: N/A;   LAPAROSCOPIC LYSIS OF ADHESIONS  06/12/2020   Procedure: LAPAROSCOPIC LYSIS OF ADHESIONS;  Surgeon: Jules Husbands, MD;  Location: [facility] ORS;  Service: General;;   PORTA CATH INSERTION N/A 04/30/2020   Procedure: PORTA CATH INSERTION;  Surgeon: Algernon Huxley, MD;  Location: [place] CV LAB;  Service: Cardiovascular;  Laterality: N/A;   VEIN BYPASS SURGERY      Social History   Socioeconomic History   Marital status: Divorced    Spouse name: Not on file   Number of children: 2   Years of education: Not on file   Highest education level: Not on file  Occupational History   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   Smoking status: Former    Types: Cigarettes   Smokeless  tobacco: Never  Scientific laboratory technician Use: Never used  Substance and Sexual Activity   Alcohol use: Not Currently   Drug use: Not Currently   Sexual activity: Not Currently  Other Topics Concern   Not on file  Social History Narrative   Lives by himself    Social Determinants of Health   Financial Resource Strain: Not on file  Food Insecurity: Not on file  Transportation Needs: Not on file  Physical Activity: Not on file  Stress: Not on file  Social Connections: Not on file  Intimate Partner Violence: Not on file    Family History  Problem Relation Age of Onset   Dementia Mother    Ulcers Father    Heart attack Brother    Throat cancer Brother    Heart attack Daughter    Diabetes Daughter    Depression Daughter      Current Outpatient Medications:    cabozantinib (CABOMETYX) 60 MG tablet, Take 1 tablet (60 mg total) by mouth daily. Take on an empty stomach, 1 hour before or 2 hours after meals., Disp: 30 tablet, Rfl: 0   ondansetron (ZOFRAN) 8 MG tablet, Take 1 tablet (8 mg total) by  mouth 2 (two) times daily as needed for nausea or vomiting. Take 30-60 mins prior to the Cabometyx., Disp: 40 tablet, Rfl: 2   lactulose (CHRONULAC) 10 GM/15ML solution, Take by mouth daily. (Patient not taking: Reported on 07/23/2021), Disp: , Rfl:    prochlorperazine (COMPAZINE) 10 MG tablet, Take 1 tablet (10 mg total) by mouth every 6 (six) hours as needed for nausea or vomiting. (Patient not taking: Reported on 07/08/2021), Disp: 30 tablet, Rfl: 0  Physical exam:  Vitals:   08/07/21 0943  BP: 136/78  Pulse: 86  Resp: 18  Temp: 98.3 F (36.8 C)  SpO2: 100%   Physical Exam Constitutional:      General: He is not in acute distress. Cardiovascular:     Rate and Rhythm: Normal rate and regular rhythm.     Heart sounds: Normal heart sounds.  Pulmonary:     Effort: Pulmonary effort is normal.     Breath sounds: Normal breath sounds.  Abdominal:     General: Bowel sounds are normal.      Palpations: Abdomen is soft.  Skin:    General: Skin is warm and dry.  Neurological:     Mental Status: He is alert and oriented to person, place, and time.        Latest Ref Rng & Units 08/07/2021    9:20 AM  CMP  Glucose 70 - 99 mg/dL 98    BUN 8 - 23 mg/dL 20    Creatinine 0.61 - 1.24 mg/dL 0.73    Sodium 135 - 145 mmol/L 136    Potassium 3.5 - 5.1 mmol/L 3.7    Chloride 98 - 111 mmol/L 103    CO2 22 - 32 mmol/L 27    Calcium 8.9 - 10.3 mg/dL 8.4    Total Protein 6.5 - 8.1 g/dL 8.4    Total Bilirubin 0.3 - 1.2 mg/dL 0.7    Alkaline Phos 38 - 126 U/L 745    AST 15 - 41 U/L 98    ALT 0 - 44 U/L 77        Latest Ref Rng & Units 08/07/2021    9:20 AM  CBC  WBC 4.0 - 10.5 K/uL 5.1    Hemoglobin 13.0 - 17.0 g/dL 12.2    Hematocrit 39.0 - 52.0 % 38.3    Platelets 150 - 400 K/uL 122       Assessment and plan- Patient is a 66 y.o. male with history of locally advanced hepatocellular carcinoma currently onCabozantinib-year-old for routine follow-up  Hepatocellular carcinoma: Continue Cabometyx at 60 mg daily.  AFP which was elevated at 1043 2 months ago had come down to 257 a month ago.  aFP from today is pending.  I will plan to get repeat CT chest abdomen and pelvis with contrast in about 3-1/2 weeks time and see him thereafter.  Patient does not wish to get his phlebotomy today.  Suspect abnormal LFTs secondary to hemochromatosis as well as underlying HCC.  Total bilirubin remains normal.  Continue to monitor.  He will receive IV fluids today and then possible IV fluids in 2 weeks if he is up for it he will get phlebotomy at that time.   Visit Diagnosis 1. Hereditary hemochromatosis ([place])   2. Cancer, hepatocellular ([place])   3. High risk medication use   4. Abnormal LFTs      Dr. Randa Evens, MD, MPH [facility] [phone] 08/07/2021 12:47 PM

## 2021-08-08 LAB — AFP TUMOR MARKER: AFP, Serum, Tumor Marker: 262 ng/mL — ABNORMAL HIGH (ref 0.0–8.4)

## 2021-08-09 ENCOUNTER — Other Ambulatory Visit: Payer: Medicare Other

## 2021-08-09 ENCOUNTER — Ambulatory Visit: Payer: Medicare Other | Admitting: Medical Oncology

## 2021-08-14 ENCOUNTER — Other Ambulatory Visit (HOSPITAL_COMMUNITY): Payer: Self-pay

## 2021-08-16 ENCOUNTER — Other Ambulatory Visit (HOSPITAL_COMMUNITY): Payer: Self-pay

## 2021-08-18 ENCOUNTER — Encounter: Payer: Self-pay | Admitting: Oncology

## 2021-08-19 ENCOUNTER — Other Ambulatory Visit (HOSPITAL_COMMUNITY): Payer: Self-pay

## 2021-08-21 ENCOUNTER — Other Ambulatory Visit: Payer: Medicare Other

## 2021-08-21 ENCOUNTER — Encounter: Payer: Self-pay | Admitting: Medical Oncology

## 2021-08-21 ENCOUNTER — Inpatient Hospital Stay: Payer: Medicare Other | Attending: Internal Medicine

## 2021-08-21 ENCOUNTER — Inpatient Hospital Stay (HOSPITAL_BASED_OUTPATIENT_CLINIC_OR_DEPARTMENT_OTHER): Payer: Medicare Other | Admitting: Medical Oncology

## 2021-08-21 ENCOUNTER — Inpatient Hospital Stay: Payer: Medicare Other

## 2021-08-21 DIAGNOSIS — C220 Liver cell carcinoma: Secondary | ICD-10-CM | POA: Insufficient documentation

## 2021-08-21 DIAGNOSIS — R5383 Other fatigue: Secondary | ICD-10-CM | POA: Diagnosis not present

## 2021-08-21 DIAGNOSIS — R634 Abnormal weight loss: Secondary | ICD-10-CM

## 2021-08-21 DIAGNOSIS — R7989 Other specified abnormal findings of blood chemistry: Secondary | ICD-10-CM

## 2021-08-21 LAB — COMPREHENSIVE METABOLIC PANEL
ALT: 111 U/L — ABNORMAL HIGH (ref 0–44)
AST: 128 U/L — ABNORMAL HIGH (ref 15–41)
Albumin: 2.9 g/dL — ABNORMAL LOW (ref 3.5–5.0)
Alkaline Phosphatase: 783 U/L — ABNORMAL HIGH (ref 38–126)
Anion gap: 7 (ref 5–15)
BUN: 13 mg/dL (ref 8–23)
CO2: 26 mmol/L (ref 22–32)
Calcium: 8.6 mg/dL — ABNORMAL LOW (ref 8.9–10.3)
Chloride: 101 mmol/L (ref 98–111)
Creatinine, Ser: 0.73 mg/dL (ref 0.61–1.24)
GFR, Estimated: >60 mL/min (ref >60)
Glucose, Bld: 102 mg/dL — ABNORMAL HIGH (ref 70–99)
Potassium: 3.8 mmol/L (ref 3.5–5.1)
Sodium: 134 mmol/L — ABNORMAL LOW (ref 135–145)
Total Bilirubin: 1.2 mg/dL (ref 0.3–1.2)
Total Protein: 8.2 g/dL — ABNORMAL HIGH (ref 6.5–8.1)

## 2021-08-21 LAB — CBC WITH DIFFERENTIAL/PLATELET
Abs Immature Granulocytes: 0.01 10*3/uL (ref 0.00–0.07)
Basophils Absolute: 0 10*3/uL (ref 0.0–0.1)
Basophils Relative: 1 %
Eosinophils Absolute: 0.1 10*3/uL (ref 0.0–0.5)
Eosinophils Relative: 1 %
HCT: 35.3 % — ABNORMAL LOW (ref 39.0–52.0)
Hemoglobin: 11.3 g/dL — ABNORMAL LOW (ref 13.0–17.0)
Immature Granulocytes: 0 %
Lymphocytes Relative: 18 %
Lymphs Abs: 1 10*3/uL (ref 0.7–4.0)
MCH: 27.1 pg (ref 26.0–34.0)
MCHC: 32 g/dL (ref 30.0–36.0)
MCV: 84.7 fL (ref 80.0–100.0)
Monocytes Absolute: 0.9 10*3/uL (ref 0.1–1.0)
Monocytes Relative: 15 %
Neutro Abs: 3.8 10*3/uL (ref 1.7–7.7)
Neutrophils Relative %: 65 %
Platelets: 152 10*3/uL (ref 150–400)
RBC: 4.17 MIL/uL — ABNORMAL LOW (ref 4.22–5.81)
RDW: 22.6 % — ABNORMAL HIGH (ref 11.5–15.5)
WBC: 5.8 10*3/uL (ref 4.0–10.5)
nRBC: 0 % (ref 0.0–0.2)

## 2021-08-21 MED ORDER — HEPARIN SOD (PORK) LOCK FLUSH 100 UNIT/ML IV SOLN
500.0000 [IU] | Freq: Once | INTRAVENOUS | Status: AC
  Administered 2021-08-21: 500 [IU] via INTRAVENOUS
  Filled 2021-08-21: qty 5

## 2021-08-21 MED ORDER — SODIUM CHLORIDE 0.9% FLUSH
10.0000 mL | Freq: Once | INTRAVENOUS | Status: AC
  Administered 2021-08-21: 10 mL via INTRAVENOUS
  Filled 2021-08-21: qty 10

## 2021-08-21 MED ORDER — SODIUM CHLORIDE 0.9 % IV SOLN
Freq: Once | INTRAVENOUS | Status: AC
  Filled 2021-08-21: qty 250

## 2021-08-21 NOTE — Progress Notes (Signed)
Hgb 11.3 ; No phlebotomy required today. Pt did receive IV hydration 1 L NS. No complaints at time of discharge.

## 2021-08-21 NOTE — Progress Notes (Signed)
Patient here for oncology follow-up appointment, concerns of slight nausea

## 2021-08-21 NOTE — Progress Notes (Signed)
Hematology/Oncology Consult note [facility]  Telephone:([phone] Fax:([phone]  Patient Care Team: Venita Lick, NP as PCP - General (Nurse Practitioner) Clent Jacks, RN as Oncology Nurse Navigator Sindy Guadeloupe, MD as Consulting Physician (Hematology and Oncology) Lin Landsman, MD as Consulting Physician (Gastroenterology)   Name of the patient: Maxwell Edwards  [id number]  08-Oct-1955   Date of visit: 08/21/21  Diagnosis- metastatic hepatocellular carcinoma    Chief complaint/ Reason for visit-routine follow-up of hepatocellular carcinoma on cabozantinib  Heme/Onc history:  patient is a 66 year old male with a past medical history significant for hypertension hyperlipidemia, 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  Reports that he has lost about 20 pounds over the last 2 to 3 months. He is a chronic smoker which prompted a CT chest.  CT chest without contrast on 04/06/2020 showed no evidence of mediastinal or hilar adenopathy.  He was found to have 4 subcentimeter lung nodules 3 in the right lobe and one in the left upper lobe which were borderline for metastatic disease.  Cirrhotic liver morphology with 8.3 x 7 cm ill-defined hypoattenuating lesion in the left liver with a 3.3 cm lesion in the dome of the liver near the junction of the right and left hepatic lobes.  Labs showed elevated AST and ALT of 62 and 47 respectively with a normal albumin and normal total bilirubin.  CMP showed elevated H&H of 18.3/53.8.  Hepatitis C and HIV testing was negative. 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  Another mass in the segment 4A measuring 3.4 x 2.9 cm.  No evidence of local regional  adenopathy.   Alpha-fetoprotein elevated at 38,000.  CA 19-9 elevated at 147.Patient's case discussed at tumor board and consensus was that this would be consistent with [facility] especially given the AFP value.  Initially MRI was not characteristic for [facility] but a biopsy was not deemed necessary after AFP results were back   Avastin Tecentriq started in March 2022.  He is also getting weekly phlebotomy for iron overload   Patient developed significant variceal bleeding in December 2022 and Avastin was on hold.  He had a repeat EGD in February 2023 which showed small nonbleeding varices and otherwise was cleared by GI to resume Avastin Disease progression based on scans in March 2023 and patient switched to second line cabozantinib  Interval history- Patient states that he is feeling much better today than his last visit. He has increased his Ensure intake from 1-2 per day to 3-4 and has noticed that his weight is stable and he has a bit more energy. He denies V/D/C but does have occasional nausea which is well mediated by his zofran. Overall feeling well. Agreeable to IVF fluids today.   ECOG PS- 2 Pain scale- 0   Review of systems- Review of Systems  Constitutional:  Negative for chills, fever, malaise/fatigue and weight loss.       Lack of appetite  HENT:  Negative for congestion, ear discharge and nosebleeds.   Eyes:  Negative for blurred vision.  Respiratory:  Negative for cough, hemoptysis, sputum production, shortness of breath and wheezing.   Cardiovascular:  Negative for chest pain, palpitations, orthopnea and  claudication.  Gastrointestinal:  Negative for abdominal pain, blood in stool, constipation, diarrhea, heartburn, melena, nausea and vomiting.  Genitourinary:  Negative for dysuria, flank pain, frequency, hematuria and urgency.  Musculoskeletal:  Negative for back pain, joint pain and myalgias.  Skin:  Negative for rash.  Neurological:  Negative for dizziness, tingling, focal  weakness, seizures, weakness and headaches.  Endo/Heme/Allergies:  Does not bruise/bleed easily.  Psychiatric/Behavioral:  Negative for depression and suicidal ideas. The patient does not have insomnia.        No Known Allergies   Past Medical History:  Diagnosis Date   Acute blood loss anemia 02/23/2021   Barrett esophagus    CAD (coronary artery disease)    Cancer (HCC)    liver cancer   Cirrhosis (HCC)    GI bleed 02/24/2021   Heart disease    Hereditary hemochromatosis ([place])      Past Surgical History:  Procedure Laterality Date   CORONARY ARTERY BYPASS GRAFT     at 66 years old   ESOPHAGOGASTRODUODENOSCOPY (EGD) WITH PROPOFOL N/A 05/21/2020   Procedure: ESOPHAGOGASTRODUODENOSCOPY (EGD) WITH PROPOFOL;  Surgeon: Lin Landsman, MD;  Location: [place];  Service: Gastroenterology;  Laterality: N/A;   ESOPHAGOGASTRODUODENOSCOPY (EGD) WITH PROPOFOL N/A 02/24/2021   Procedure: ESOPHAGOGASTRODUODENOSCOPY (EGD) WITH PROPOFOL;  Surgeon: Annamaria Helling, DO;  Location[facility] ENDOSCOPY;  Service: Gastroenterology;  Laterality: N/A;   ESOPHAGOGASTRODUODENOSCOPY (EGD) WITH PROPOFOL N/A 04/29/2021   Procedure: ESOPHAGOGASTRODUODENOSCOPY (EGD) WITH PROPOFOL;  Surgeon: Lin Landsman, MD;  Location: [facility] ENDOSCOPY;  Service: Gastroenterology;  Laterality: N/A;   LAPAROSCOPIC LYSIS OF ADHESIONS  06/12/2020   Procedure: LAPAROSCOPIC LYSIS OF ADHESIONS;  Surgeon: Jules Husbands, MD;  Location: [facility] ORS;  Service: General;;   PORTA CATH INSERTION N/A 04/30/2020   Procedure: PORTA CATH INSERTION;  Surgeon: Algernon Huxley, MD;  Location: [place] CV LAB;  Service: Cardiovascular;  Laterality: N/A;   VEIN BYPASS SURGERY      Social History   Socioeconomic History   Marital status: Divorced    Spouse name: Not on file   Number of children: 2   Years of education: Not on file   Highest education level: Not on file  Occupational History   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 status: Former    Types: Cigarettes   Smokeless tobacco: Never  Scientific laboratory technician Use: Never used  Substance and Sexual Activity   Alcohol use: Not Currently   Drug use: Not Currently   Sexual activity: Not Currently  Other Topics Concern   Not on file  Social History Narrative   Lives by himself    Social Determinants of Health   Financial Resource Strain: Not on file  Food Insecurity: Not on file  Transportation Needs: Not on file  Physical Activity: Not on file  Stress: Not on file  Social Connections: Not on file  Intimate Partner Violence: Not on file    Family History  Problem Relation Age of Onset   Dementia Mother    Ulcers Father    Heart attack Brother    Throat cancer Brother    Heart attack Daughter    Diabetes Daughter    Depression Daughter      Current Outpatient Medications:    cabozantinib (CABOMETYX) 60 MG tablet, Take 1 tablet (60 mg total) by mouth daily. Take on an empty stomach, 1 hour before or 2 hours after meals., Disp:  30 tablet, Rfl: 0   ondansetron (ZOFRAN) 8 MG tablet, Take 1 tablet (8 mg total) by mouth 2 (two) times daily as needed for nausea or vomiting. Take 30-60 mins prior to the Cabometyx., Disp: 40 tablet, Rfl: 2   lactulose (CHRONULAC) 10 GM/15ML solution, Take by mouth daily. (Patient not taking: Reported on 07/23/2021), Disp: , Rfl:    prochlorperazine (COMPAZINE) 10 MG tablet, Take 1 tablet (10 mg total) by mouth every 6 (six) hours as needed for nausea or vomiting. (Patient not taking: Reported on 07/08/2021), Disp: 30 tablet, Rfl: 0  Physical exam:  Vitals:   08/21/21 0844  BP: 128/67  Pulse: 81  Resp: 17  Temp: 98.2 F (36.8 C)  TempSrc: Tympanic  SpO2: 100%   Physical Exam Constitutional:      General: He is not in acute distress. Cardiovascular:     Rate and Rhythm: Normal rate and regular rhythm.     Heart sounds: Normal heart sounds.  Pulmonary:      Effort: Pulmonary effort is normal.     Breath sounds: Normal breath sounds.  Abdominal:     General: Bowel sounds are normal.     Palpations: Abdomen is soft.  Skin:    General: Skin is warm and dry.  Neurological:     Mental Status: He is alert and oriented to person, place, and time.         Latest Ref Rng & Units 08/21/2021    8:31 AM  CMP  Glucose 70 - 99 mg/dL 102   BUN 8 - 23 mg/dL 13   Creatinine 0.61 - 1.24 mg/dL 0.73   Sodium 135 - 145 mmol/L 134   Potassium 3.5 - 5.1 mmol/L 3.8   Chloride 98 - 111 mmol/L 101   CO2 22 - 32 mmol/L 26   Calcium 8.9 - 10.3 mg/dL 8.6   Total Protein 6.5 - 8.1 g/dL 8.2   Total Bilirubin 0.3 - 1.2 mg/dL 1.2   Alkaline Phos 38 - 126 U/L 783   AST 15 - 41 U/L 128   ALT 0 - 44 U/L 111       Latest Ref Rng & Units 08/21/2021    8:31 AM  CBC  WBC 4.0 - 10.5 K/uL 5.8   Hemoglobin 13.0 - 17.0 g/dL 11.3   Hematocrit 39.0 - 52.0 % 35.3   Platelets 150 - 400 K/uL 152      Assessment and plan- Patient is a 66 y.o. male with history of locally advanced hepatocellular carcinoma currently onCabozantinib-year-old for routine follow-up  Hepatocellular carcinoma: Chronic in nature. Plan will be to continue Cabometyx at 60 mg daily.  AFP today is 262 which is slightly up from 08/07/2021 (257) however significantly lower than 3 months ago (1,043). 1L IVF today. Has CT scheduled for 08/30/2021 and will follow up with Dr. Janese Banks after with labs.   No phlebotomy today given labs. LFTs remain elevated -per Dr. Janese Banks- Suspect abnormal LFTs secondary to hemochromatosis as well as underlying [place].  Total bilirubin remains normal. Plan to continue to monitor.    Visit Diagnosis 1. Hereditary hemochromatosis ([place])   2. Cancer, hepatocellular ([place])   3. Abnormal LFTs   4. Weight loss      Nelwyn Salisbury PA-C [place] [facility] 08/21/2021 9:13 AM

## 2021-08-22 LAB — AFP TUMOR MARKER: AFP, Serum, Tumor Marker: 268 ng/mL — ABNORMAL HIGH (ref 0.0–8.4)

## 2021-08-27 ENCOUNTER — Other Ambulatory Visit (HOSPITAL_COMMUNITY): Payer: Self-pay

## 2021-08-29 ENCOUNTER — Other Ambulatory Visit (HOSPITAL_COMMUNITY): Payer: Self-pay

## 2021-08-30 ENCOUNTER — Ambulatory Visit
Admission: RE | Admit: 2021-08-30 | Discharge: 2021-08-30 | Disposition: A | Discharge status: Home / Self Care | Payer: Medicare Other | Source: Ambulatory Visit | Attending: Oncology | Admitting: Oncology

## 2021-08-30 DIAGNOSIS — C220 Liver cell carcinoma: Secondary | ICD-10-CM | POA: Diagnosis not present

## 2021-08-30 DIAGNOSIS — R590 Localized enlarged lymph nodes: Secondary | ICD-10-CM | POA: Diagnosis not present

## 2021-08-30 DIAGNOSIS — I700 Atherosclerosis of aorta: Secondary | ICD-10-CM | POA: Diagnosis not present

## 2021-08-30 DIAGNOSIS — R918 Other nonspecific abnormal finding of lung field: Secondary | ICD-10-CM | POA: Diagnosis not present

## 2021-08-30 MED ORDER — IOHEXOL 300 MG/ML  SOLN: 100.0000 mL | Freq: Once | INTRAMUSCULAR | Status: DC | PRN

## 2021-08-30 MED ORDER — IOHEXOL 350 MG/ML SOLN
100.0000 mL | Freq: Once | INTRAVENOUS | Status: AC | PRN
  Administered 2021-08-30: 100 mL via INTRAVENOUS

## 2021-09-04 ENCOUNTER — Encounter: Payer: Self-pay | Admitting: Oncology

## 2021-09-04 ENCOUNTER — Inpatient Hospital Stay (HOSPITAL_BASED_OUTPATIENT_CLINIC_OR_DEPARTMENT_OTHER): Payer: Medicare Other | Admitting: Oncology

## 2021-09-04 ENCOUNTER — Other Ambulatory Visit: Payer: Self-pay | Admitting: Pharmacist

## 2021-09-04 ENCOUNTER — Inpatient Hospital Stay: Payer: Medicare Other

## 2021-09-04 ENCOUNTER — Other Ambulatory Visit (HOSPITAL_COMMUNITY): Payer: Self-pay

## 2021-09-04 DIAGNOSIS — C220 Liver cell carcinoma: Secondary | ICD-10-CM

## 2021-09-04 DIAGNOSIS — Z7189 Other specified counseling: Secondary | ICD-10-CM | POA: Diagnosis not present

## 2021-09-04 DIAGNOSIS — Z79899 Other long term (current) drug therapy: Secondary | ICD-10-CM | POA: Diagnosis not present

## 2021-09-04 DIAGNOSIS — R634 Abnormal weight loss: Secondary | ICD-10-CM | POA: Diagnosis not present

## 2021-09-04 DIAGNOSIS — R5383 Other fatigue: Secondary | ICD-10-CM | POA: Diagnosis not present

## 2021-09-04 LAB — CBC WITH DIFFERENTIAL/PLATELET
Abs Immature Granulocytes: 0.05 10*3/uL (ref 0.00–0.07)
Basophils Absolute: 0.1 10*3/uL (ref 0.0–0.1)
Basophils Relative: 1 %
Eosinophils Absolute: 0 10*3/uL (ref 0.0–0.5)
Eosinophils Relative: 0 %
HCT: 36.1 % — ABNORMAL LOW (ref 39.0–52.0)
Hemoglobin: 11.5 g/dL — ABNORMAL LOW (ref 13.0–17.0)
Immature Granulocytes: 1 %
Lymphocytes Relative: 13 %
Lymphs Abs: 1.3 10*3/uL (ref 0.7–4.0)
MCH: 28.8 pg (ref 26.0–34.0)
MCHC: 31.9 g/dL (ref 30.0–36.0)
MCV: 90.5 fL (ref 80.0–100.0)
Monocytes Absolute: 1.3 10*3/uL — ABNORMAL HIGH (ref 0.1–1.0)
Monocytes Relative: 13 %
Neutro Abs: 6.8 10*3/uL (ref 1.7–7.7)
Neutrophils Relative %: 72 %
Platelets: 201 10*3/uL (ref 150–400)
RBC: 3.99 MIL/uL — ABNORMAL LOW (ref 4.22–5.81)
RDW: 22.7 % — ABNORMAL HIGH (ref 11.5–15.5)
WBC: 9.5 10*3/uL (ref 4.0–10.5)
nRBC: 0 % (ref 0.0–0.2)

## 2021-09-04 LAB — IRON AND TIBC
Iron: 54 ug/dL (ref 45–182)
Saturation Ratios: 19 % (ref 17.9–39.5)
TIBC: 293 ug/dL (ref 250–450)
UIBC: 239 ug/dL

## 2021-09-04 LAB — COMPREHENSIVE METABOLIC PANEL
ALT: 74 U/L — ABNORMAL HIGH (ref 0–44)
AST: 104 U/L — ABNORMAL HIGH (ref 15–41)
Albumin: 2.6 g/dL — ABNORMAL LOW (ref 3.5–5.0)
Alkaline Phosphatase: 666 U/L — ABNORMAL HIGH (ref 38–126)
Anion gap: 6 (ref 5–15)
BUN: 17 mg/dL (ref 8–23)
CO2: 25 mmol/L (ref 22–32)
Calcium: 8.2 mg/dL — ABNORMAL LOW (ref 8.9–10.3)
Chloride: 98 mmol/L (ref 98–111)
Creatinine, Ser: 0.6 mg/dL — ABNORMAL LOW (ref 0.61–1.24)
GFR, Estimated: >60 mL/min (ref >60)
Glucose, Bld: 105 mg/dL — ABNORMAL HIGH (ref 70–99)
Potassium: 3.8 mmol/L (ref 3.5–5.1)
Sodium: 129 mmol/L — ABNORMAL LOW (ref 135–145)
Total Bilirubin: 1.9 mg/dL — ABNORMAL HIGH (ref 0.3–1.2)
Total Protein: 8.4 g/dL — ABNORMAL HIGH (ref 6.5–8.1)

## 2021-09-04 LAB — FERRITIN: Ferritin: 209 ng/mL (ref 24–336)

## 2021-09-04 MED ORDER — SODIUM CHLORIDE 0.9 % IV SOLN
Freq: Once | INTRAVENOUS | Status: AC
  Filled 2021-09-04: qty 250

## 2021-09-04 MED ORDER — OXYCODONE HCL 5 MG PO TABS: 5.0000 mg | ORAL_TABLET | Freq: Four times a day (QID) | ORAL | 0 refills | Status: DC | PRN

## 2021-09-04 MED ORDER — SODIUM CHLORIDE 0.9% FLUSH
10.0000 mL | Freq: Once | INTRAVENOUS | Status: AC
  Administered 2021-09-04: 10 mL via INTRAVENOUS
  Filled 2021-09-04: qty 10

## 2021-09-04 MED ORDER — CABOZANTINIB S-MALATE 40 MG PO TABS
40.0000 mg | ORAL_TABLET | Freq: Every day | ORAL | 0 refills | Status: AC
  Filled 2021-09-04 (×2): qty 30, 30d supply, fill #0

## 2021-09-04 MED ORDER — HEPARIN SOD (PORK) LOCK FLUSH 100 UNIT/ML IV SOLN
500.0000 [IU] | Freq: Once | INTRAVENOUS | Status: AC
  Administered 2021-09-04: 500 [IU] via INTRAVENOUS
  Filled 2021-09-04: qty 5

## 2021-09-04 MED ORDER — DRONABINOL 2.5 MG PO CAPS: 2.5000 mg | ORAL_CAPSULE | Freq: Two times a day (BID) | ORAL | 0 refills | Status: AC

## 2021-09-04 NOTE — Progress Notes (Signed)
Received 500 ml NS hydration. No phlebotomy today.

## 2021-09-04 NOTE — Progress Notes (Signed)
Pt in for follow up, fluids and possible phlebotomy.

## 2021-09-04 NOTE — Progress Notes (Signed)
Hematology/Oncology Consult note [facility]  [phone] [phone]  Patient Care Team: Venita Lick, NP as PCP - General (Nurse Practitioner) Clent Jacks, RN as Oncology Nurse Navigator Sindy Guadeloupe, MD as Consulting Physician (Hematology and Oncology) Lin Landsman, MD as Consulting Physician (Gastroenterology)   Name of the patient: Maxwell Edwards  [id number]  [birth year]   Date of visit: 09/04/21  Diagnosis- metastatic hepatocellular carcinoma  Chief complaint/ Reason for visit-discuss CT scan results and further management  Heme/Onc history:  patient is a 66 year old male with a past medical history significant for hypertension hyperlipidemia, 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  Reports that he has lost about 20 pounds over the last 2 to 3 months. He is a chronic smoker which prompted a CT chest.  CT chest without contrast on 04/06/2020 showed no evidence of mediastinal or hilar adenopathy.  He was found to have 4 subcentimeter lung nodules 3 in the right lobe and one in the left upper lobe which were borderline for metastatic disease.  Cirrhotic liver morphology with 8.3 x 7 cm ill-defined hypoattenuating lesion in the left liver with a 3.3 cm lesion in the dome of the liver near the junction of the right and left hepatic lobes.  Labs showed elevated AST and ALT of 62 and 47 respectively with a normal albumin and normal total bilirubin.  CMP showed elevated H&H of 18.3/53.8.  Hepatitis C and HIV testing was negative. 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  Another mass in the segment 4A measuring 3.4 x 2.9 cm.  No evidence of local regional adenopathy.    Alpha-fetoprotein elevated at 38,000.  CA 19-9 elevated at 147.Patient's case discussed at tumor board and consensus was that this would be consistent with [facility] especially given the AFP value.  Initially MRI was not characteristic for [facility] but a biopsy was not deemed necessary after AFP results were back   Avastin Tecentriq started in March 2022.  He is also getting weekly phlebotomy for iron overload   Patient developed significant variceal bleeding in December 2022 and Avastin was on hold.  He had a repeat EGD in February 2023 which showed small nonbleeding varices and otherwise was cleared by GI to resume Avastin Disease progression based on scans in March 2023 and patient switched to second line cabozantinib    Interval history-patient recently lost his sister.  He has been feeling chronically fatigued.  Appetite is poor.  He stopped taking most of his medications about 2 to 3 weeks ago including his cabozantinib.  Has occasional tremors but does not use his Ativan as much.  ECOG PS- 2 Pain scale- 0   Review of systems- Review of Systems  Constitutional:  Positive for malaise/fatigue and weight loss.       Lack of appetite  Neurological:  Positive for tremors.      No Known Allergies   Past Medical History:  Diagnosis Date   Acute blood loss anemia 02/23/2021   Barrett esophagus    CAD (coronary artery disease)    Cancer (HCC)    liver cancer   Cirrhosis ([place])    GI bleed 02/24/2021   Heart disease  Hereditary hemochromatosis ([place])      Past Surgical History:  Procedure Laterality Date   CORONARY ARTERY BYPASS GRAFT     at 66 years old   ESOPHAGOGASTRODUODENOSCOPY (EGD) WITH PROPOFOL N/A 05/21/2020   Procedure: ESOPHAGOGASTRODUODENOSCOPY (EGD) WITH PROPOFOL;  Surgeon: Lin Landsman, MD;  Location: [place];  Service: Gastroenterology;  Laterality: N/A;   ESOPHAGOGASTRODUODENOSCOPY (EGD) WITH PROPOFOL N/A 02/24/2021   Procedure: ESOPHAGOGASTRODUODENOSCOPY  (EGD) WITH PROPOFOL;  Surgeon: Annamaria Helling, DO;  Location: [facility] ENDOSCOPY;  Service: Gastroenterology;  Laterality: N/A;   ESOPHAGOGASTRODUODENOSCOPY (EGD) WITH PROPOFOL N/A 04/29/2021   Procedure: ESOPHAGOGASTRODUODENOSCOPY (EGD) WITH PROPOFOL;  Surgeon: Lin Landsman, MD;  Location: [facility] ENDOSCOPY;  Service: Gastroenterology;  Laterality: N/A;   LAPAROSCOPIC LYSIS OF ADHESIONS  06/12/2020   Procedure: LAPAROSCOPIC LYSIS OF ADHESIONS;  Surgeon: Jules Husbands, MD;  Location: [facility] ORS;  Service: General;;   PORTA CATH INSERTION N/A 04/30/2020   Procedure: PORTA CATH INSERTION;  Surgeon: Algernon Huxley, MD;  Location: [place] CV LAB;  Service: Cardiovascular;  Laterality: N/A;   VEIN BYPASS SURGERY      Social History   Socioeconomic History   Marital status: Divorced    Spouse name: Not on file   Number of children: 2   Years of education: Not on file   Highest education level: Not on file  Occupational History   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[employer] Use   Smoking status: Former    Types: Cigarettes   Smokeless tobacco: Never  Scientific laboratory technician Use: Never used  Substance and Sexual Activity   Alcohol use: Not Currently   Drug use: Not Currently   Sexual activity: Not Currently  Other Topics Concern   Not on file  Social History Narrative   Lives by himself    Social Determinants of Health   Financial Resource Strain: Not on file  Food Insecurity: Not on file  Transportation Needs: Not on file  Physical Activity: Not on file  Stress: Not on file  Social Connections: Not on file  Intimate Partner Violence: Not on file    Family History  Problem Relation Age of Onset   Dementia Mother    Ulcers Father    Heart attack Brother    Throat cancer Brother    Heart attack Daughter    Diabetes Daughter    Depression Daughter      Current Outpatient Medications:    ondansetron (ZOFRAN) 8 MG tablet, Take 1 tablet (8 mg total)  by mouth 2 (two) times daily as needed for nausea or vomiting. Take 30-60 mins prior to the Cabometyx., Disp: 40 tablet, Rfl: 2   cabozantinib (CABOMETYX) 60 MG tablet, Take 1 tablet (60 mg total) by mouth daily. Take on an empty stomach, 1 hour before or 2 hours after meals. (Patient not taking: Reported on 09/04/2021), Disp: 30 tablet, Rfl: 0   lactulose (CHRONULAC) 10 GM/15ML solution, Take by mouth daily. (Patient not taking: Reported on 07/23/2021), Disp: , Rfl:    prochlorperazine (COMPAZINE) 10 MG tablet, Take 1 tablet (10 mg total) by mouth every 6 (six) hours as needed for nausea or vomiting. (Patient not taking: Reported on 07/08/2021), Disp: 30 tablet, Rfl: 0 No current facility-administered medications for this visit.  Facility-Administered Medications Ordered in Other Visits:    heparin lock flush 100 unit/mL, 500 Units, Intravenous, Once, Sindy Guadeloupe, MD  Physical exam:  Vitals:   09/04/21 0928  BP:  121/78  Pulse: 89  Resp: 16  Temp: 99.6 F (37.6 C)  TempSrc: Tympanic  SpO2: 100%  Weight: 129 lb (58.5 kg)   Physical Exam Constitutional:      Comments: Thin frail appearing gentleman in no acute distress  Cardiovascular:     Rate and Rhythm: Normal rate and regular rhythm.     Heart sounds: Normal heart sounds.  Pulmonary:     Effort: Pulmonary effort is normal.     Breath sounds: Normal breath sounds.  Abdominal:     General: Bowel sounds are normal.     Palpations: Abdomen is soft.  Skin:    General: Skin is warm and dry.  Neurological:     Mental Status: He is alert and oriented to person, place, and time.         Latest Ref Rng & Units 08/21/2021    8:31 AM  CMP  Glucose 70 - 99 mg/dL 102   BUN 8 - 23 mg/dL 13   Creatinine 0.61 - 1.24 mg/dL 0.73   Sodium 135 - 145 mmol/L 134   Potassium 3.5 - 5.1 mmol/L 3.8   Chloride 98 - 111 mmol/L 101   CO2 22 - 32 mmol/L 26   Calcium 8.9 - 10.3 mg/dL 8.6   Total Protein 6.5 - 8.1 g/dL 8.2   Total Bilirubin 0.3 -  1.2 mg/dL 1.2   Alkaline Phos 38 - 126 U/L 783   AST 15 - 41 U/L 128   ALT 0 - 44 U/L 111       Latest Ref Rng & Units 08/21/2021    8:31 AM  CBC  WBC 4.0 - 10.5 K/uL 5.8   Hemoglobin 13.0 - 17.0 g/dL 11.3   Hematocrit 39.0 - 52.0 % 35.3   Platelets 150 - 400 K/uL 152     No images are attached to the encounter.  CT CHEST ABDOMEN PELVIS W CONTRAST  Result Date: 08/31/2021 CLINICAL DATA:  Hepatocellular carcinoma. Restaging. * Tracking Code: BO * EXAM: CT CHEST, ABDOMEN, AND PELVIS WITH CONTRAST TECHNIQUE: Multidetector CT imaging of the chest, abdomen and pelvis was performed following the standard protocol during bolus administration of intravenous contrast. RADIATION DOSE REDUCTION: This exam was performed according to the departmental dose-optimization program which includes automated exposure control, adjustment of the mA and/or kV according to patient size and/or use of iterative reconstruction technique. CONTRAST:  173m OMNIPAQUE IOHEXOL 350 MG/ML SOLN COMPARISON:  05/28/2021 FINDINGS: CT CHEST FINDINGS Cardiovascular: The heart size is normal. No substantial pericardial effusion. Coronary artery calcification is evident. Moderate atherosclerotic calcification is noted in the wall of the thoracic aorta. Right Port-A-Cath tip is positioned in the distal SVC. Mediastinum/Nodes: Interval development of 12 mm short axis subcarinal node on 28/2. 11 mm short axis right hilar node on 31/2 is similar to prior. The esophagus has normal imaging features. There is no axillary lymphadenopathy. Lungs/Pleura: New 6 mm subpleural left upper lobe nodule on image 53/6. Progressing 1.8 x 1.5 cm focus of soft tissue attenuation in the central right middle lobe, encasing and attenuating airways to the right middle lobe (81/6). No focal airspace consolidation. No pleural effusion. Musculoskeletal: No worrisome lytic or sclerotic osseous abnormality. CT ABDOMEN PELVIS FINDINGS Hepatobiliary: Dominant left hepatic  lobe lesion measures 10.2 x 6.6 cm today compared to 9.3 x 5.3 cm previously. 3.5 cm lesion medial right liver on 51/2 was 3.2 cm previously. 3.2 cm rim enhancing necrotic lesion in the central right liver on 56/2 was  approximately 2.7 cm previously (remeasured) 2.7 cm diffusely enhancing lesion medial inferior right liver on 65/2 was 2.5 cm previously 2.1 cm diffusely enhancing lesion inferior right liver on 71/2 appears new since prior. There is no evidence for gallstones, gallbladder wall thickening, or pericholecystic fluid. No intrahepatic or extrahepatic biliary dilation. Pancreas: No focal mass lesion. No dilatation of the main duct. No intraparenchymal cyst. No peripancreatic edema. Spleen: Similar splenomegaly. Adrenals/Urinary Tract: No adrenal nodule or mass. Kidneys unremarkable. No evidence for hydroureter. The urinary bladder appears normal for the degree of distention. Stomach/Bowel: Stomach is unremarkable. No gastric wall thickening. No evidence of outlet obstruction. Duodenum is normally positioned as is the ligament of Treitz. No small bowel wall thickening. No small bowel dilatation. The terminal ileum is normal. The appendix is not well visualized, but there is no edema or inflammation in the region of the cecum. No gross colonic mass. No colonic wall thickening. Vascular/Lymphatic: There is moderate atherosclerotic calcification of the abdominal aorta without aneurysm. 12 mm short axis necrotic gastrohepatic ligament lymph node on image 52/2 was 9 mm short axis previously. 13 mm short axis hepatoduodenal ligament lymph node on 62/2 was 10 mm previously. Similar mild increase in para-aortic retroperitoneal lymph nodes. No pelvic sidewall lymphadenopathy. Reproductive: The prostate gland and seminal vesicles are unremarkable. Other: Trace free fluid is seen in the pelvis. Musculoskeletal: Lytic lesion left iliac bone measured previously at 2.1 cm is 2.1 cm again today. Bones are diffusely  demineralized. Similar appearance in the lytic lesion involving the T5 vertebral body. IMPRESSION: 1. Interval progression of 1.8 x 1.5 cm focus of soft tissue attenuation in the central right middle lobe, encasing and attenuating airways to the right middle lobe. This is associated with a new 6 mm subpleural nodule left upper lobe. Features concerning for metastatic disease. Close attention on follow-up recommended. 2. Mild interval progression of right hilar and mediastinal lymphadenopathy. 3. Generalized trend of mildly progressive multiple hepatic lesions and abdominal lymphadenopathy. 4. Similar appearance lytic lesions in the left iliac bone and T5 vertebral body. 5. Similar splenomegaly. 6. Trace free fluid in the pelvis. 7. Aortic Atherosclerosis (ICD10-I70.0). Electronically Signed   By: Misty Stanley M.D.   On: 08/31/2021 07:35     Assessment and plan- Patient is a 66 y.o. male with metastatic hepatocellular carcinoma here to discuss further management  Patient noted to have disease progression on his prior scan in March 2023.  At that time his AFP was in the thousands.  Cabozantinib was started almost 4 weeks later.  After starting cabozantinib his AFP has gradually gone downInto the 200s.  Last value was 268 as compared to 1043 3 months ago.  CT scan does show evidence of disease progression as evidenced by enlarging subcarinal lymph node, new subpleural lymph node as well as increase in the size of liver and intra-abdominal metastases.  However in the interval of 12 weeks between the 2 scans patient has not taken cabozantinib for almost 6 weeks.  I would therefore like to give cabozantinib a longer trial before saying that it is not working.  Patient does experience more fatigue and loss of appetite and we will reduce the dose of cabozantinib from 60 mg to 40 mg.  If he has disease progression on cabozantinib then third line options including regorafenib and lenvatinib do not appear promising as  they are similar in terms of mechanism of action and veg F inhibition.  I have discussed all this with the patient and his  family in detail.  At any point if patient feels that he does not wish to go through any more treatment then proceeding with best supportive care/hospice remains reasonable.For now he would  like to continue cabozantinib at 40 mg   Hereditary hemochromatosis: hold off on phlebotomy today although ferritin is 200 due to ongoing fatigue.   IV fluids today.  Labs IV fluids and CMP Joshua Borders in 2 weeks and I will see him back in 4 weeks  Patient reports ongoing back pain for which I will give him 5 mg of oxycodone every 6 hours as needed.  It is nonmalignant in etiology  Patient reports eating well when he occasionally eats cookies or brownies which have marijuana.  I am therefore giving him a trial of low-dose Marinol 2.5 mg twice daily to see if it can help him with his appetite   Visit Diagnosis 1. Hereditary hemochrom[place] ([place])   2. Goals of care, counseling/discussion   3. High risk medication use   4. Cancer, hepatocellular ([place])      Dr. Randa Evens, MD, MPH [facility] [phone] 09/04/2021 9:30 AM

## 2021-09-05 ENCOUNTER — Other Ambulatory Visit: Payer: Self-pay

## 2021-09-06 ENCOUNTER — Encounter: Payer: Self-pay | Admitting: Oncology

## 2021-09-06 MED ORDER — LORAZEPAM 0.5 MG PO TABS
0.5000 mg | ORAL_TABLET | Freq: Three times a day (TID) | ORAL | 0 refills | Status: AC
Start: 2021-09-06 — End: 2021-12-05

## 2021-09-12 ENCOUNTER — Encounter: Payer: Self-pay | Admitting: Oncology

## 2021-09-13 ENCOUNTER — Other Ambulatory Visit: Payer: Self-pay

## 2021-09-13 NOTE — Telephone Encounter (Signed)
We can try megace for appetite

## 2021-09-15 ENCOUNTER — Encounter: Payer: Self-pay | Admitting: Oncology

## 2021-09-15 MED ORDER — MEGESTROL ACETATE 20 MG PO TABS: 20.0000 mg | ORAL_TABLET | Freq: Every day | ORAL | 1 refills | Status: DC

## 2021-09-19 ENCOUNTER — Encounter: Payer: Self-pay | Admitting: Hospice and Palliative Medicine

## 2021-09-19 ENCOUNTER — Inpatient Hospital Stay (HOSPITAL_BASED_OUTPATIENT_CLINIC_OR_DEPARTMENT_OTHER): Payer: Medicare Other | Admitting: Hospice and Palliative Medicine

## 2021-09-19 ENCOUNTER — Inpatient Hospital Stay: Payer: Medicare Other

## 2021-09-19 ENCOUNTER — Inpatient Hospital Stay: Payer: Medicare Other | Attending: Internal Medicine

## 2021-09-19 VITALS — BP 121/78 | HR 85 | Temp 98.3°F | Resp 16 | Wt 124.5 lb

## 2021-09-19 DIAGNOSIS — R110 Nausea: Secondary | ICD-10-CM | POA: Insufficient documentation

## 2021-09-19 DIAGNOSIS — C220 Liver cell carcinoma: Secondary | ICD-10-CM | POA: Diagnosis not present

## 2021-09-19 DIAGNOSIS — R269 Unspecified abnormalities of gait and mobility: Secondary | ICD-10-CM

## 2021-09-19 DIAGNOSIS — R634 Abnormal weight loss: Secondary | ICD-10-CM | POA: Diagnosis not present

## 2021-09-19 LAB — COMPREHENSIVE METABOLIC PANEL
ALT: 80 U/L — ABNORMAL HIGH (ref 0–44)
AST: 94 U/L — ABNORMAL HIGH (ref 15–41)
Albumin: 2.3 g/dL — ABNORMAL LOW (ref 3.5–5.0)
Alkaline Phosphatase: 908 U/L — ABNORMAL HIGH (ref 38–126)
Anion gap: 5 (ref 5–15)
BUN: 15 mg/dL (ref 8–23)
CO2: 27 mmol/L (ref 22–32)
Calcium: 8.5 mg/dL — ABNORMAL LOW (ref 8.9–10.3)
Chloride: 101 mmol/L (ref 98–111)
Creatinine, Ser: 0.67 mg/dL (ref 0.61–1.24)
GFR, Estimated: >60 mL/min (ref >60)
Glucose, Bld: 152 mg/dL — ABNORMAL HIGH (ref 70–99)
Potassium: 4 mmol/L (ref 3.5–5.1)
Sodium: 133 mmol/L — ABNORMAL LOW (ref 135–145)
Total Bilirubin: 0.9 mg/dL (ref 0.3–1.2)
Total Protein: 8.4 g/dL — ABNORMAL HIGH (ref 6.5–8.1)

## 2021-09-19 LAB — CBC WITH DIFFERENTIAL/PLATELET
Abs Immature Granulocytes: 0.04 10*3/uL (ref 0.00–0.07)
Basophils Absolute: 0.1 10*3/uL (ref 0.0–0.1)
Basophils Relative: 1 %
Eosinophils Absolute: 0 10*3/uL (ref 0.0–0.5)
Eosinophils Relative: 1 %
HCT: 39 % (ref 39.0–52.0)
Hemoglobin: 12.6 g/dL — ABNORMAL LOW (ref 13.0–17.0)
Immature Granulocytes: 1 %
Lymphocytes Relative: 12 %
Lymphs Abs: 1 10*3/uL (ref 0.7–4.0)
MCH: 28.4 pg (ref 26.0–34.0)
MCHC: 32.3 g/dL (ref 30.0–36.0)
MCV: 88 fL (ref 80.0–100.0)
Monocytes Absolute: 0.7 10*3/uL (ref 0.1–1.0)
Monocytes Relative: 9 %
Neutro Abs: 6.8 10*3/uL (ref 1.7–7.7)
Neutrophils Relative %: 76 %
Platelets: 214 10*3/uL (ref 150–400)
RBC: 4.43 MIL/uL (ref 4.22–5.81)
RDW: 19.7 % — ABNORMAL HIGH (ref 11.5–15.5)
WBC: 8.7 10*3/uL (ref 4.0–10.5)
nRBC: 0 % (ref 0.0–0.2)

## 2021-09-19 MED ORDER — SODIUM CHLORIDE 0.9 % IV SOLN
Freq: Once | INTRAVENOUS | Status: AC
  Filled 2021-09-19: qty 250

## 2021-09-19 MED ORDER — HEPARIN SOD (PORK) LOCK FLUSH 100 UNIT/ML IV SOLN
500.0000 [IU] | Freq: Once | INTRAVENOUS | Status: AC
  Administered 2021-09-19: 500 [IU] via INTRAVENOUS
  Filled 2021-09-19: qty 5

## 2021-09-19 MED ORDER — SODIUM CHLORIDE 0.9% FLUSH
10.0000 mL | Freq: Once | INTRAVENOUS | Status: AC
  Administered 2021-09-19: 10 mL via INTRAVENOUS
  Filled 2021-09-19: qty 10

## 2021-09-19 NOTE — Progress Notes (Signed)
Poor appetite. Has weakness. Lower energy. Has intermittent abdominal pain on right side of abdomen. Occ constipation relieved with stool softeners. Tremor of hands unchanged. Occ mild edema after being up walking in his boots for a long period of time.Marland Kitchen

## 2021-09-19 NOTE — Progress Notes (Signed)
[place]  Telephone:([phone] Fax:(336) [phone]   Name: Maxwell Edwards Date: 09/19/2021 MRN: [id number]  DOB: [birth year]  Patient Care Team: Venita Lick, NP as PCP - General (Nurse Practitioner) Clent Jacks, RN as Oncology Nurse Navigator Sindy Guadeloupe, MD as Consulting Physician (Hematology and Oncology) Lin Landsman, MD as Consulting Physician (Gastroenterology)    REASON FOR CONSULTATION: Maxwell Edwards is a 66 y.o. male with multiple medical problems including CAD status post CABG, hypertension, hyperlipidemia, and metastatic hepatocellular carcinoma.  History of significant variceal bleeding in December 2022 on Avastin.  Disease progression on CTs in March 2023 and patient switched to second line cabozantinib.  Patient has been symptomatic with nausea, weight loss, and insomnia.  Palliative care was consulted up address goals and manage ongoing symptoms.  SOCIAL HISTORY:     reports that he has quit smoking. His smoking use included cigarettes. He has never used smokeless tobacco. He reports that he does not currently use alcohol. He reports that he does not currently use drugs.  Patient is divorced.  He lives at home alone.  His ex-wife is still involved in his care as are his 2 daughters.  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[place]:  Living will on file  CODE STATUS: DNR/DNI (DNR order signed on 05/24/20)  PAST MEDICAL HISTORY: Past Medical History:  Diagnosis Date   Acute blood loss anemia 02/23/2021   Barrett esophagus    CAD (coronary artery disease)    Cancer (HCC)    liver cancer   Cirrhosis ([place])    GI bleed 02/24/2021   Heart disease    Hereditary hemochromatosis ([place])     PAST SURGICAL HISTORY:  Past Surgical History:  Procedure Laterality Date   CORONARY ARTERY BYPASS GRAFT     at 66 years old   ESOPHAGOGASTRODUODENOSCOPY (EGD) WITH  PROPOFOL N/A 05/21/2020   Procedure: ESOPHAGOGASTRODUODENOSCOPY (EGD) WITH PROPOFOL;  Surgeon: Lin Landsman, MD;  Location: [place];  Service: Gastroenterology;  Laterality: N/A;   ESOPHAGOGASTRODUODENOSCOPY (EGD) WITH PROPOFOL N/A 02/24/2021   Procedure: ESOPHAGOGASTRODUODENOSCOPY (EGD) WITH PROPOFOL;  Surgeon: Annamaria Helling, DO;  Location[facility] END[facility]COPY;  Service: Gastroenterology;  Laterality: N/A;   ESOPHAGOGASTRODUODENOSCOPY (EGD) WITH PROPOFOL N/A 04/29/2021   Procedure: ESOPHAGOGASTRODUODENOSCOPY (EGD) WITH PROPOFOL;  Surgeon: Lin Landsman, MD;  [facility] ENDOSCOPY;  Service: Gastroenterology;  Laterality: N/A;   LAPAROSCOPIC LYSIS OF ADHESIONS  06/12/2020   Procedure: LAPAROSCOPIC LYSIS OF ADHESIONS;  Surgeon: Jules Husbands, MD;  Location: [facility] ORS;  Service: General;;   PORTA CATH INSERTION N/A 04/30/2020   Procedure: PORTA CATH INSERTION;  Surgeon: Algernon Huxley, MD;  Location: [place] CV LAB;  Service: Cardiovascular;  Laterality: N/A;   VEIN BYPASS SURGERY      HEMATOLOGY/ONCOLOGY HISTORY:  Oncology History  Cancer, hepatocellular ([place])  04/20/2020 Cancer Staging   Staging form: Liver, AJCC 8th Edition - Clinical stage from 04/20/2020: Stage IIIB (cT4, cN0, cM0) - Signed by Sindy Guadeloupe, MD on 04/21/2020   04/21/2020 Initial Diagnosis   Cancer, hepatocellular ([place])   05/10/2020 -  Chemotherapy   Patient is on Treatment Plan : Liver Atezolizumab + Bevacizumab q21d       ALLERGIES:  has No Known Allergies.  MEDICATIONS:  Current Outpatient Medications  Medication Sig Dispense Refill   cabozantinib (CABOMETYX) 40 MG tablet Take 1 tablet (40 mg total) by mouth daily. Take on an  empty stomach, 1 hour before or 2 hours after meals. 30 tablet 0   dronabinol (MARINOL) 2.5 MG capsule Take 1 capsule (2.5 mg total) by mouth 2 (two) times daily before a meal. 60 capsule 0   LORazepam (ATIVAN) 0.5 MG tablet Take 1 tablet (0.5 mg total) by mouth every  8 (eight) hours. For tremors 180 tablet 0   megestrol (MEGACE) 20 MG tablet Take 1 tablet (20 mg total) by mouth daily. 30 tablet 1   ondansetron (ZOFRAN) 8 MG tablet Take 1 tablet (8 mg total) by mouth 2 (two) times daily as needed for nausea or vomiting. Take 30-60 mins prior to the Cabometyx. 40 tablet 2   oxyCODONE (OXY IR/ROXICODONE) 5 MG immediate release tablet Take 1 tablet (5 mg total) by mouth every 6 (six) hours as needed for severe pain. 120 tablet 0   lactulose (CHRONULAC) 10 GM/15ML solution Take by mouth daily. (Patient not taking: Reported on 07/23/2021)     prochlorperazine (COMPAZINE) 10 MG tablet Take 1 tablet (10 mg total) by mouth every 6 (six) hours as needed for nausea or vomiting. (Patient not taking: Reported on 07/08/2021) 30 tablet 0   No current facility-administered medications for this visit.   Facility-Administered Medications Ordered in Other Visits  Medication Dose Route Frequency Provider Last Rate Last Admin   heparin lock flush 100 unit/mL  500 Units Intravenous Once Sindy Guadeloupe, MD        VITAL SIGNS: BP 121/78 (BP Location: Right Arm, Patient Position: Sitting)   Pulse 85   Temp 98.3 F (36.8 C) (Tympanic)   Resp 16   Wt 124 lb 8 oz (56.5 kg)   SpO2 100%   BMI 18.39 kg/m  Filed Weights   09/19/21 0846  Weight: 124 lb 8 oz (56.5 kg)    Estimated body mass index is 18.39 kg/m as calculated from the following:   Height as of 04/29/21: '5\' 9"'$  (1.753 m).   Weight as of this encounter: 124 lb 8 oz (56.5 kg).  LABS: CBC:    Component Value Date/Time   WBC 9.5 09/04/2021 0916   HGB 11.5 (L) 09/04/2021 0916   HGB 18.3 (H) 04/06/2020 1152   HCT 36.1 (L) 09/04/2021 0916   HCT 53.8 (H) 04/06/2020 1152   PLT 201 09/04/2021 0916   PLT 247 04/06/2020 1152   MCV 90.5 09/04/2021 0916   MCV 94 04/06/2020 1152   NEUTROABS 6.8 09/04/2021 0916   NEUTROABS 7.1 (H) 04/06/2020 1152   LYMPHSABS 1.3 09/04/2021 0916   LYMPHSABS 2.1 04/06/2020 1152   MONOABS  1.3 (H) 09/04/2021 0916   EOSABS 0.0 09/04/2021 0916   EOSABS 0.1 04/06/2020 1152   BASOSABS 0.1 09/04/2021 0916   BASOSABS 0.1 04/06/2020 1152   Comprehensive Metabolic Panel:    Component Value Date/Time   NA 129 (L) 09/04/2021 0916   NA 135 04/06/2020 1152   K 3.8 09/04/2021 0916   CL 98 09/04/2021 0916   CO2 25 09/04/2021 0916   BUN 17 09/04/2021 0916   BUN 15 04/06/2020 1152   CREATININE 0.60 (L) 09/04/2021 0916   GLUCOSE 105 (H) 09/04/2021 0916   CALCIUM 8.2 (L) 09/04/2021 0916   AST 104 (H) 09/04/2021 0916   ALT 74 (H) 09/04/2021 0916   ALKPHOS 666 (H) 09/04/2021 0916   BILITOT 1.9 (H) 09/04/2021 0916   BILITOT 0.6 04/06/2020 1152   PROT 8.4 (H) 09/04/2021 0916   PROT 8.2 04/06/2020 1152   ALBUMIN 2.6 (L) 09/04/2021  4268   ALBUMIN 4.3 04/06/2020 1152    RADIOGRAPHIC STUDIES: CT CHEST ABDOMEN PELVIS W CONTRAST  Result Date: 08/31/2021 CLINICAL DATA:  Hepatocellular carcinoma. Restaging. * Tracking Code: BO * EXAM: CT CHEST, ABDOMEN, AND PELVIS WITH CONTRAST TECHNIQUE: Multidetector CT imaging of the chest, abdomen and pelvis was performed following the standard protocol during bolus administration of intravenous contrast. RADIATION DOSE REDUCTION: This exam was performed according to the departmental dose-optimization program which includes automated exposure control, adjustment of the mA and/or kV according to patient size and/or use of iterative reconstruction technique. CONTRAST:  146m OMNIPAQUE IOHEXOL 350 MG/ML SOLN COMPARISON:  05/28/2021 FINDINGS: CT CHEST FINDINGS Cardiovascular: The heart size is normal. No substantial pericardial effusion. Coronary artery calcification is evident. Moderate atherosclerotic calcification is noted in the wall of the thoracic aorta. Right Port-A-Cath tip is positioned in the distal SVC. Mediastinum/Nodes: Interval development of 12 mm short axis subcarinal node on 28/2. 11 mm short axis right hilar node on 31/2 is similar to prior. The  esophagus has normal imaging features. There is no axillary lymphadenopathy. Lungs/Pleura: New 6 mm subpleural left upper lobe nodule on image 53/6. Progressing 1.8 x 1.5 cm focus of soft tissue attenuation in the central right middle lobe, encasing and attenuating airways to the right middle lobe (81/6). No focal airspace consolidation. No pleural effusion. Musculoskeletal: No worrisome lytic or sclerotic osseous abnormality. CT ABDOMEN PELVIS FINDINGS Hepatobiliary: Dominant left hepatic lobe lesion measures 10.2 x 6.6 cm today compared to 9.3 x 5.3 cm previously. 3.5 cm lesion medial right liver on 51/2 was 3.2 cm previously. 3.2 cm rim enhancing necrotic lesion in the central right liver on 56/2 was approximately 2.7 cm previously (remeasured) 2.7 cm diffusely enhancing lesion medial inferior right liver on 65/2 was 2.5 cm previously 2.1 cm diffusely enhancing lesion inferior right liver on 71/2 appears new since prior. There is no evidence for gallstones, gallbladder wall thickening, or pericholecystic fluid. No intrahepatic or extrahepatic biliary dilation. Pancreas: No focal mass lesion. No dilatation of the main duct. No intraparenchymal cyst. No peripancreatic edema. Spleen: Similar splenomegaly. Adrenals/Urinary Tract: No adrenal nodule or mass. Kidneys unremarkable. No evidence for hydroureter. The urinary bladder appears normal for the degree of distention. Stomach/Bowel: Stomach is unremarkable. No gastric wall thickening. No evidence of outlet obstruction. Duodenum is normally positioned as is the ligament of Treitz. No small bowel wall thickening. No small bowel dilatation. The terminal ileum is normal. The appendix is not well visualized, but there is no edema or inflammation in the region of the cecum. No gross colonic mass. No colonic wall thickening. Vascular/Lymphatic: There is moderate atherosclerotic calcification of the abdominal aorta without aneurysm. 12 mm short axis necrotic gastrohepatic  ligament lymph node on image 52/2 was 9 mm short axis previously. 13 mm short axis hepatoduodenal ligament lymph node on 62/2 was 10 mm previously. Similar mild increase in para-aortic retroperitoneal lymph nodes. No pelvic sidewall lymphadenopathy. Reproductive: The prostate gland and seminal vesicles are unremarkable. Other: Trace free fluid is seen in the pelvis. Musculoskeletal: Lytic lesion left iliac bone measured previously at 2.1 cm is 2.1 cm again today. Bones are diffusely demineralized. Similar appearance in the lytic lesion involving the T5 vertebral body. IMPRESSION: 1. Interval progression of 1.8 x 1.5 cm focus of soft tissue attenuation in the central right middle lobe, encasing and attenuating airways to the right middle lobe. This is associated with a new 6 mm subpleural nodule left upper lobe. Features concerning for metastatic disease. Close  attention on follow-up recommended. 2. Mild interval progression of right hilar and mediastinal lymphadenopathy. 3. Generalized trend of mildly progressive multiple hepatic lesions and abdominal lymphadenopathy. 4. Similar appearance lytic lesions in the left iliac bone and T5 vertebral body. 5. Similar splenomegaly. 6. Trace free fluid in the pelvis. 7. Aortic Atherosclerosis (ICD10-I70.0). Electronically Signed   By: Misty Stanley M.D.   On: 08/31/2021 07:35    PERFORMANCE STATUS (ECOG) : 1 - Symptomatic but completely ambulatory  Review of Systems Unless otherwise noted, a complete review of systems is negative.  Physical Exam General: Frail appearing Pulmonary: Unlabored Extremities: no edema, no joint deformities Skin: no rashes Neurological: Weakness but otherwise nonfocal  IMPRESSION: He is recently had disease progression and rotated to second line treatments.  Per Dr. Elroy Channel notes, there are additional treatments in the event of further progression, however, unclear how efficacious these treatments would be.  Hospice/supportive care had  also been discussed as a possible option in the event of further decline.  CODE STATUS has been previously discussed and DNR is on file.  Today, patient reports that he is doing okay.  He denies significant changes or concerns.  He denies symptomatic complaints today.  He has occasional pain and takes oxycodone on average once daily.  Appetite has been chronically poor.  Patient recently started on Megace but patient says he has not yet seen improvement.  Of note, could consider significantly higher dose of Megace up to 400-800 mg, the latter which is most often used in cases of severe cachexia such as HIV.  However, will defer to Dr. Janese Banks if risk profile is acceptable.  Patient does endorse a recent fall.  He is unsure how it happened but he was walking down steps when it occurred.  He says he has a walker in the home but is not consistently using it.  We will make a referral to [facility] for further evaluation.  Patient declines needing or wanting more support at home.  He says his ex-wife is staying with him during the day.  Patient does have a life alert system.  PLAN: -Continue current scope of treatment -Referral for rehab screening -Continue oxycodone -Could consider higher doses of Megace if appetite remains poor -Follow-up telephone visit 1 month    Patient expressed understanding and was in agreement with this plan. He also understands that He can call the clinic at any time with any questions, concerns, or complaints.     Time Total: 25 minutes  Visit consisted of counseling and education dealing with the complex and emotionally intense issues of symptom management and palliative care in the setting of serious and potentially life-threatening illness.Greater than 50%  of this time was spent counseling and coordinating care related to the above assessment and plan.  Signed by: Altha Harm, PhD, NP-C

## 2021-09-19 NOTE — Progress Notes (Signed)
Discussed possible phlebotomy with [employer]. Decision made to hold off today due to over all weakness.Pt receiving 1 L NS today.

## 2021-09-20 ENCOUNTER — Telehealth: Payer: Self-pay | Admitting: Hospice and Palliative Medicine

## 2021-09-20 LAB — AFP TUMOR MARKER: AFP, Serum, Tumor Marker: 1071 ng/mL — ABNORMAL HIGH (ref 0.0–8.4)

## 2021-09-20 NOTE — Telephone Encounter (Signed)
Spoke with patient's significant other (ex-wife) Max.  She says the patient stopped taking cabozantinib for over 2 weeks before restarting it recently.  She says the patient has started preparing for his end-of-life by giving away belongings.  She says that he is sleeping more and telling her that he hopes that he can die in his sleep.  I asked if he even wanted to continue treatment versus focusing on comfort and involving hospice.  She is not sure that he would be excepting of hospice.  I offered to bring him in next week for discussion of goals but she says that she does not think he wants to talk about it.  Instead, she would prefer just to keep the appointment on the 26th.

## 2021-10-01 ENCOUNTER — Other Ambulatory Visit (HOSPITAL_COMMUNITY): Payer: Self-pay

## 2021-10-02 ENCOUNTER — Inpatient Hospital Stay (HOSPITAL_BASED_OUTPATIENT_CLINIC_OR_DEPARTMENT_OTHER): Payer: Medicare Other | Admitting: Hospice and Palliative Medicine

## 2021-10-02 ENCOUNTER — Encounter: Payer: Self-pay | Admitting: Oncology

## 2021-10-02 ENCOUNTER — Inpatient Hospital Stay: Payer: Medicare Other

## 2021-10-02 ENCOUNTER — Inpatient Hospital Stay (HOSPITAL_BASED_OUTPATIENT_CLINIC_OR_DEPARTMENT_OTHER): Payer: Medicare Other | Admitting: Oncology

## 2021-10-02 VITALS — BP 108/73 | HR 105 | Temp 98.4°F | Resp 18 | Wt 124.4 lb

## 2021-10-02 DIAGNOSIS — R110 Nausea: Secondary | ICD-10-CM | POA: Diagnosis not present

## 2021-10-02 DIAGNOSIS — C220 Liver cell carcinoma: Secondary | ICD-10-CM

## 2021-10-02 DIAGNOSIS — R634 Abnormal weight loss: Secondary | ICD-10-CM | POA: Diagnosis not present

## 2021-10-02 DIAGNOSIS — Z7189 Other specified counseling: Secondary | ICD-10-CM

## 2021-10-02 DIAGNOSIS — E860 Dehydration: Secondary | ICD-10-CM

## 2021-10-02 LAB — COMPREHENSIVE METABOLIC PANEL
ALT: 78 U/L — ABNORMAL HIGH (ref 0–44)
AST: 83 U/L — ABNORMAL HIGH (ref 15–41)
Albumin: 2.2 g/dL — ABNORMAL LOW (ref 3.5–5.0)
Alkaline Phosphatase: 875 U/L — ABNORMAL HIGH (ref 38–126)
Anion gap: 3 — ABNORMAL LOW (ref 5–15)
BUN: 29 mg/dL — ABNORMAL HIGH (ref 8–23)
CO2: 24 mmol/L (ref 22–32)
Calcium: 8.4 mg/dL — ABNORMAL LOW (ref 8.9–10.3)
Chloride: 108 mmol/L (ref 98–111)
Creatinine, Ser: 0.6 mg/dL — ABNORMAL LOW (ref 0.61–1.24)
GFR, Estimated: >60 mL/min (ref >60)
Glucose, Bld: 189 mg/dL — ABNORMAL HIGH (ref 70–99)
Potassium: 4.4 mmol/L (ref 3.5–5.1)
Sodium: 135 mmol/L (ref 135–145)
Total Bilirubin: 0.9 mg/dL (ref 0.3–1.2)
Total Protein: 8 g/dL (ref 6.5–8.1)

## 2021-10-02 LAB — CBC WITH DIFFERENTIAL/PLATELET
Abs Immature Granulocytes: 0.04 10*3/uL (ref 0.00–0.07)
Basophils Absolute: 0.1 10*3/uL (ref 0.0–0.1)
Basophils Relative: 1 %
Eosinophils Absolute: 0.1 10*3/uL (ref 0.0–0.5)
Eosinophils Relative: 1 %
HCT: 34.4 % — ABNORMAL LOW (ref 39.0–52.0)
Hemoglobin: 11.5 g/dL — ABNORMAL LOW (ref 13.0–17.0)
Immature Granulocytes: 1 %
Lymphocytes Relative: 17 %
Lymphs Abs: 1.3 10*3/uL (ref 0.7–4.0)
MCH: 29.2 pg (ref 26.0–34.0)
MCHC: 33.4 g/dL (ref 30.0–36.0)
MCV: 87.3 fL (ref 80.0–100.0)
Monocytes Absolute: 0.6 10*3/uL (ref 0.1–1.0)
Monocytes Relative: 8 %
Neutro Abs: 5.6 10*3/uL (ref 1.7–7.7)
Neutrophils Relative %: 72 %
Platelets: 195 10*3/uL (ref 150–400)
RBC: 3.94 MIL/uL — ABNORMAL LOW (ref 4.22–5.81)
RDW: 20.5 % — ABNORMAL HIGH (ref 11.5–15.5)
WBC: 7.7 10*3/uL (ref 4.0–10.5)
nRBC: 0 % (ref 0.0–0.2)

## 2021-10-02 LAB — MAGNESIUM: Magnesium: 2 mg/dL (ref 1.7–2.4)

## 2021-10-02 MED ORDER — HEPARIN SOD (PORK) LOCK FLUSH 100 UNIT/ML IV SOLN
500.0000 [IU] | Freq: Once | INTRAVENOUS | Status: AC
  Administered 2021-10-02: 500 [IU] via INTRAVENOUS
  Filled 2021-10-02: qty 5

## 2021-10-02 MED ORDER — SODIUM CHLORIDE 0.9% FLUSH
10.0000 mL | Freq: Once | INTRAVENOUS | Status: AC
  Administered 2021-10-02: 10 mL via INTRAVENOUS
  Filled 2021-10-02: qty 10

## 2021-10-02 MED ORDER — SODIUM CHLORIDE 0.9 % IV SOLN
Freq: Once | INTRAVENOUS | Status: AC
  Filled 2021-10-02: qty 250

## 2021-10-02 NOTE — Progress Notes (Signed)
[place]  Telephone:([phone] Fax:(336) [phone]   Name: Maxwell Edwards Date: 10/02/2021 MRN: [id number]  DOB: 02/23/1956  Patient Care Team: Venita Lick, NP as PCP - General (Nurse Practitioner) Clent Jacks, RN as Oncology Nurse Navigator Sindy Guadeloupe, MD as Consulting Physician (Hematology and Oncology) Lin Landsman, MD as Consulting Physician (Gastroenterology)    REASON FOR CONSULTATION: Maxwell Edwards is a 66 y.o. male with multiple medical problems including CAD status post CABG, hypertension, hyperlipidemia, and metastatic hepatocellular carcinoma.  History of significant variceal bleeding in December 2022 on Avastin.  Disease progression on CTs in March 2023 and patient switched to second line cabozantinib.  Patient has been symptomatic with nausea, weight loss, and insomnia.  Palliative care was consulted up address goals and manage ongoing symptoms.  SOCIAL HISTORY:     reports that he has quit smoking. His smoking use included cigarettes. He has never used smokeless tobacco. He reports that he does not currently use alcohol. He reports that he does not currently use drugs.  Patient is divorced.  He lives at home alone.  His ex-wife is still involved in his care as are his 2 daughters.  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[place]:  Living will on file  CODE STATUS: DNR/DNI (DNR order signed on 05/24/20)  PAST MEDICAL HISTORY: Past Medical History:  Diagnosis Date   Acute blood loss anemia 02/23/2021   Barrett esophagus    CAD (coronary artery disease)    Cancer (HCC)    liver cancer   Cirrhosis ([place])    GI bleed 02/24/2021   Heart disease    Hereditary hemochromatosis ([place])     PAST SURGICAL HISTORY:  Past Surgical History:  Procedure Laterality Date   CORONARY ARTERY BYPASS GRAFT     at 66 years old   ESOPHAGOGASTRODUODENOSCOPY (EGD) WITH  PROPOFOL N/A 05/21/2020   Procedure: ESOPHAGOGASTRODUODENOSCOPY (EGD) WITH PROPOFOL;  Surgeon: Lin Landsman, MD;  Location: [place];  Service: Gastroenterology;  Laterality: N/A;   ESOPHAGOGASTRODUODENOSCOPY (EGD) WITH PROPOFOL N/A 02/24/2021   Procedure: ESOPHAGOGASTRODUODENOSCOPY (EGD) WITH PROPOFOL;  Surgeon: Annamaria Helling, DO;  Location: [facility] ENDOSCOPY;  Service: Gastroenterology;  Laterality: N/A;   ESOPHAGOGASTRODUODENOSCOPY (EGD) WITH PROPOFOL N/A 04/29/2021   Procedure: ESOPHAGOGASTRODUODENOSCOPY (EGD) WITH PROPOFOL;  Surgeon: Lin Landsman, MD;  Location[facility] ENDOSCOPY;  Service: Gastroenterology;  Laterality: N/A;   LAPAROSCOPIC LYSIS OF ADHESIONS  06/12/2020   Procedure: LAPAROSCOPIC LYSIS OF ADHESIONS;  Surgeon: Jules Husbands, MD;  Location: [facility] ORS;  Service: General;;   PORTA CATH INSERTION N/A 04/30/2020   Procedure: PORTA CATH INSERTION;  Surgeon: Algernon Huxley, MD;  Location: [place] CV LAB;  Service: Cardiovascular;  Laterality: N/A;   VEIN BYPASS SURGERY      HEMATOLOGY/ONCOLOGY HISTORY:  Oncology History  Cancer, hepatocellular ([place])  04/20/2020 Cancer Staging   Staging form: Liver, AJCC 8th Edition - Clinical stage from 04/20/2020: Stage IIIB (cT4, cN0, cM0) - Signed by Sindy Guadeloupe, MD on 04/21/2020   04/21/2020 Initial Diagnosis   Cancer, hepatocellular ([place])   05/10/2020 -  Chemotherapy   Patient is on Treatment Plan : Liver Atezolizumab + Bevacizumab q21d       ALLERGIES:  has No Known Allergies.  MEDICATIONS:  Current Outpatient Medications  Medication Sig Dispense Refill   cabozantinib (CABOMETYX) 40 MG tablet Take 1 tablet (40 mg total) by mouth daily. Take on an  empty stomach, 1 hour before or 2 hours after meals. 30 tablet 0   dronabinol (MARINOL) 2.5 MG capsule Take 1 capsule (2.5 mg total) by mouth 2 (two) times daily before a meal. 60 capsule 0   lactulose (CHRONULAC) 10 GM/15ML solution Take by mouth daily. (Patient not  taking: Reported on 07/23/2021)     LORazepam (ATIVAN) 0.5 MG tablet Take 1 tablet (0.5 mg total) by mouth every 8 (eight) hours. For tremors 180 tablet 0   megestrol (MEGACE) 20 MG tablet Take 1 tablet (20 mg total) by mouth daily. (Patient not taking: Reported on 10/02/2021) 30 tablet 1   ondansetron (ZOFRAN) 8 MG tablet Take 1 tablet (8 mg total) by mouth 2 (two) times daily as needed for nausea or vomiting. Take 30-60 mins prior to the Cabometyx. (Patient not taking: Reported on 10/02/2021) 40 tablet 2   oxyCODONE (OXY IR/ROXICODONE) 5 MG immediate release tablet Take 1 tablet (5 mg total) by mouth every 6 (six) hours as needed for severe pain. 120 tablet 0   prochlorperazine (COMPAZINE) 10 MG tablet Take 1 tablet (10 mg total) by mouth every 6 (six) hours as needed for nausea or vomiting. (Patient not taking: Reported on 07/08/2021) 30 tablet 0   No current facility-administered medications for this visit.   Facility-Administered Medications Ordered in Other Visits  Medication Dose Route Frequency Provider Last Rate Last Admin   heparin lock flush 100 unit/mL  500 Units Intravenous Once Sindy Guadeloupe, MD        VITAL SIGNS: There were no vitals taken for this visit. There were no vitals filed for this visit.   Estimated body mass index is 18.38 kg/m as calculated from the following:   Height as of 04/29/21: 5' 9"  (1.753 m).   Weight as of an earlier encounter on 10/02/21: 124 lb 7.2 oz (56.4 kg).  LABS: CBC:    Component Value Date/Time   WBC 8.7 09/19/2021 0840   HGB 12.6 (L) 09/19/2021 0840   HGB 18.3 (H) 04/06/2020 1152   HCT 39.0 09/19/2021 0840   HCT 53.8 (H) 04/06/2020 1152   PLT 214 09/19/2021 0840   PLT 247 04/06/2020 1152   MCV 88.0 09/19/2021 0840   MCV 94 04/06/2020 1152   NEUTROABS 6.8 09/19/2021 0840   NEUTROABS 7.1 (H) 04/06/2020 1152   LYMPHSABS 1.0 09/19/2021 0840   LYMPHSABS 2.1 04/06/2020 1152   MONOABS 0.7 09/19/2021 0840   EOSABS 0.0 09/19/2021 0840    EOSABS 0.1 04/06/2020 1152   BASOSABS 0.1 09/19/2021 0840   BASOSABS 0.1 04/06/2020 1152   Comprehensive Metabolic Panel:    Component Value Date/Time   NA 133 (L) 09/19/2021 0840   NA 135 04/06/2020 1152   K 4.0 09/19/2021 0840   CL 101 09/19/2021 0840   CO2 27 09/19/2021 0840   BUN 15 09/19/2021 0840   BUN 15 04/06/2020 1152   CREATININE 0.67 09/19/2021 0840   GLUCOSE 152 (H) 09/19/2021 0840   CALCIUM 8.5 (L) 09/19/2021 0840   AST 94 (H) 09/19/2021 0840   ALT 80 (H) 09/19/2021 0840   ALKPHOS 908 (H) 09/19/2021 0840   BILITOT 0.9 09/19/2021 0840   BILITOT 0.6 04/06/2020 1152   PROT 8.4 (H) 09/19/2021 0840   PROT 8.2 04/06/2020 1152   ALBUMIN 2.3 (L) 09/19/2021 0840   ALBUMIN 4.3 04/06/2020 1152    RADIOGRAPHIC STUDIES: No results found.  PERFORMANCE STATUS (ECOG) : 1 - Symptomatic but completely ambulatory  Review of Systems Unless otherwise  noted, a complete review of systems is negative.  Physical Exam General: Frail appearing Pulmonary: Unlabored Extremities: no edema, no joint deformities Skin: no rashes Neurological: Weakness but otherwise nonfocal  IMPRESSION: He is recently had disease progression and rotated to second line treatments.    Dr. Janese Banks met with patient today and he opted to forego further treatments and focus on comfort.  He was interested in hospice involvement.  I called and spoke with his ex-wife, Max, he says she anticipated decision for hospice.  She says she and family are in agreement with hospice care and focusing on his comfort and quality of life at home.  Patient has complained of left leg/knee/hip pain without fall or identifiable injury.  Pain is primarily at night.  Patient says he is not taking the oxycodone. I spoke with Max about the pain and she plans to start giving him an oxycodone at bedtime.  PLAN: -Best supportive care -Referral for hospice at home ( I spoke with referral coordinator at [facility]) -Oxycodone as needed  for pain -RTC as needed  Case and plan discussed with Dr. Janese Banks  Patient expressed understanding and was in agreement with this plan. He also understands that He can call the clinic at any time with any questions, concerns, or complaints.     Time Total: 20 minutes  Visit consisted of counseling and education dealing with the complex and emotionally intense issues of symptom management and palliative care in the setting of serious and potentially life-threatening illness.Greater than 50%  of this time was spent counseling and coordinating care related to the above assessment and plan.  Signed by: Altha Harm, PhD, NP-C

## 2021-10-02 NOTE — Progress Notes (Signed)
No appetite anymore. Drinking ensure. Has fatigue. Complaining of leg pains bilaterally also weakness in legs. Has intermittent pain in right side of abdomen.

## 2021-10-02 NOTE — Progress Notes (Signed)
Hematology/Oncology Consult note [facility]  [phone] [phone]  Patient Care Team: Venita Lick, NP as PCP - General (Nurse Practitioner) Clent Jacks, RN as Oncology Nurse Navigator Sindy Guadeloupe, MD as Consulting Physician (Hematology and Oncology) Lin Landsman, MD as Consulting Physician (Gastroenterology)   Name of the patient: Maxwell Edwards  [id number]  [birth year]   Date of visit: 10/02/21  Diagnosis- metastatic hepatocellular carcinoma  Chief complaint/ Reason for visit-routine follow-up of hepatocellular carcinoma and discussion of further goals of care  Heme/Onc history: patient is a 66 year old male with a past medical history significant for hypertension hyperlipidemia, 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  Reports that he has lost about 20 pounds over the last 2 to 3 months. He is a chronic smoker which prompted a CT chest.  CT chest without contrast on 04/06/2020 showed no evidence of mediastinal or hilar adenopathy.  He was found to have 4 subcentimeter lung nodules 3 in the right lobe and one in the left upper lobe which were borderline for metastatic disease.  Cirrhotic liver morphology with 8.3 x 7 cm ill-defined hypoattenuating lesion in the left liver with a 3.3 cm lesion in the dome of the liver near the junction of the right and left hepatic lobes.  Labs showed elevated AST and ALT of 62 and 47 respectively with a normal albumin and normal total bilirubin.  CMP showed elevated H&H of 18.3/53.8.  Hepatitis C and HIV testing was negative. 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  Another mass in the segment 4A measuring 3.4 x 2.9 cm.  No  evidence of local regional adenopathy.   Alpha-fetoprotein elevated at 38,000.  CA 19-9 elevated at 147.Patient's case discussed at tumor board and consensus was that this would be consistent with [facility] especially given the AFP value.  Initially MRI was not characteristic for [facility] but a biopsy was not deemed necessary after AFP results were back   Avastin Tecentriq started in March 2022.  He is also getting weekly phlebotomy for iron overload   Patient developed significant variceal bleeding in December 2022 and Avastin was on hold.  He had a repeat EGD in February 2023 which showed small nonbleeding varices and otherwise was cleared by GI to resume Avastin Disease progression based on scans in March 2023 and patient switched to second line cabozantinib    Interval history-patient has been feeling poorly overall appetite is going down and he is fatigued.  He sits most of the day in his recliner and barely walks around.  Denies any significant pain  ECOG PS- 3 Pain scale- 0 Opioid associated constipation- no  Review of systems- Review of Systems  Constitutional:  Positive for malaise/fatigue and weight loss. Negative for chills and fever.  HENT:  Negative for congestion, ear discharge and nosebleeds.   Eyes:  Negative for blurred vision.  Respiratory:  Negative for cough, hemoptysis, sputum production, shortness of breath and wheezing.   Cardiovascular:  Negative for chest pain, palpitations, orthopnea and claudication.  Gastrointestinal:  Negative for abdominal pain, blood in stool, constipation, diarrhea, heartburn, melena, nausea and vomiting.  Genitourinary:  Negative for dysuria, flank pain, frequency, hematuria and urgency.  Musculoskeletal:  Negative for back pain, joint pain and myalgias.  Skin:  Negative for rash.  Neurological:  Negative for dizziness, tingling, focal weakness, seizures, weakness and headaches.  Endo/Heme/Allergies:  Does not bruise/bleed easily.   Psychiatric/Behavioral:  Negative for depression and suicidal ideas. The patient does not have insomnia.       No Known Allergies   Past Medical History:  Diagnosis Date   Acute blood loss anemia 02/23/2021   Barrett esophagus    CAD (coronary artery disease)    Cancer (HCC)    liver cancer   Cirrhosis (HCC)    GI bleed 02/24/2021   Heart disease    Hereditary hemochromatosis ([place])      Past Surgical History:  Procedure Laterality Date   CORONARY ARTERY BYPASS GRAFT     at 66 years old   ESOPHAGOGASTRODUODENOSCOPY (EGD) WITH PROPOFOL N/A 05/21/2020   Procedure: ESOPHAGOGASTRODUODENOSCOPY (EGD) WITH PROPOFOL;  Surgeon: Lin Landsman, MD;  Location: [place];  Service: Gastroenterology;  Laterality: N/A;   ESOPHAGOGASTRODUODENOSCOPY (EGD) WITH PROPOFOL N/A 02/24/2021   Procedure: ESOPHAGOGASTRODUODENOSCOPY (EGD) WITH PROPOFOL;  Surgeon: Annamaria Helling, DO;  Location: [facility] ENDOSCOPY;  Service: Gastroenterology;  Laterality: N/A;   ESOPHAGOGASTRODUODENOSCOPY (EGD) WITH PROPOFOL N/A 04/29/2021   Procedure: ESOPHAGOGASTRODUODENOSCOPY (EGD) WITH PROPOFOL;  Surgeon: Lin Landsman, MD;  Location: [facility] ENDOSCOPY;  Service: Gastroenterology;  Laterality: N/A;   LAPAROSCOPIC LYSIS OF ADHESIONS  06/12/2020   Procedure: LAPAROSCOPIC LYSIS OF ADHESIONS;  Surgeon: Jules Husbands, MD;  Location: [facility] ORS;  Service: General;;   PORTA CATH INSERTION N/A 04/30/2020   Procedure: PORTA CATH INSERTION;  Surgeon: Algernon Huxley, MD;  Location: [place];  Service: Cardiovascular;  Laterality: N/A;   VEIN BYPASS SURGERY      Social History   Socioeconomic History   Marital status: Divorced    Spouse name: Not on file   Number of children: 2   Years of education: Not on file   Highest education level: Not on file  Occupational History   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[employer] Use   Smoking status: Former    Types: Cigarettes   Smokeless  tobacco: Never  Scientific laboratory technician Use: Never used  Substance and Sexual Activity   Alcohol use: Not Currently   Drug use: Not Currently   Sexual activity: Not Currently  Other Topics Concern   Not on file  Social History Narrative   Lives by himself    Social Determinants of Health   Financial Resource Strain: Not on file  Food Insecurity: Not on file  Transportation Needs: Not on file  Physical Activity: Not on file  Stress: Not on file  Social Connections: Not on file  Intimate Partner Violence: Not on file    Family History  Problem Relation Age of Onset   Dementia Mother    Ulcers Father    Heart attack Brother    Throat cancer Brother    Heart attack Daughter    Diabetes Daughter    Depression Daughter      Current Outpatient Medications:    cabozantinib (CABOMETYX) 40 MG tablet, Take 1 tablet (40 mg total) by mouth daily. Take on an empty stomach, 1 hour before or 2 hours after meals., Disp: 30 tablet, Rfl: 0   dronabinol (MARINOL) 2.5 MG capsule, Take 1 capsule (2.5 mg total) by mouth 2 (two) times daily before a meal., Disp: 60 capsule, Rfl: 0  LORazepam (ATIVAN) 0.5 MG tablet, Take 1 tablet (0.5 mg total) by mouth every 8 (eight) hours. For tremors, Disp: 180 tablet, Rfl: 0   oxyCODONE (OXY IR/ROXICODONE) 5 MG immediate release tablet, Take 1 tablet (5 mg total) by mouth every 6 (six) hours as needed for severe pain., Disp: 120 tablet, Rfl: 0   lactulose (CHRONULAC) 10 GM/15ML solution, Take by mouth daily. (Patient not taking: Reported on 07/23/2021), Disp: , Rfl:    megestrol (MEGACE) 20 MG tablet, Take 1 tablet (20 mg total) by mouth daily. (Patient not taking: Reported on 10/02/2021), Disp: 30 tablet, Rfl: 1   ondansetron (ZOFRAN) 8 MG tablet, Take 1 tablet (8 mg total) by mouth 2 (two) times daily as needed for nausea or vomiting. Take 30-60 mins prior to the Cabometyx. (Patient not taking: Reported on 10/02/2021), Disp: 40 tablet, Rfl: 2   prochlorperazine  (COMPAZINE) 10 MG tablet, Take 1 tablet (10 mg total) by mouth every 6 (six) hours as needed for nausea or vomiting. (Patient not taking: Reported on 07/08/2021), Disp: 30 tablet, Rfl: 0 No current facility-administered medications for this visit.  Facility-Administered Medications Ordered in Other Visits:    heparin lock flush 100 unit/mL, 500 Units, Intravenous, Once, Sindy Guadeloupe, MD  Physical exam:  Vitals:   10/02/21 0926  BP: 108/73  Pulse: (!) 105  Resp: 18  Temp: 98.4 F (36.9 C)  TempSrc: Tympanic  Weight: 124 lb 7.2 oz (56.4 kg)   Physical Exam Constitutional:      Comments: Appears frail and fatigued  Cardiovascular:     Rate and Rhythm: Normal rate and regular rhythm.     Heart sounds: Normal heart sounds.  Pulmonary:     Effort: Pulmonary effort is normal.     Breath sounds: Normal breath sounds.  Abdominal:     General: Bowel sounds are normal.     Palpations: Abdomen is soft.  Skin:    General: Skin is warm and dry.  Neurological:     Mental Status: He is alert and oriented to person, place, and time.         Latest Ref Rng & Units 10/02/2021    9:02 AM  CMP  Glucose 70 - 99 mg/dL 189   BUN 8 - 23 mg/dL 29   Creatinine 0.61 - 1.24 mg/dL 0.60   Sodium 135 - 145 mmol/L 135   Potassium 3.5 - 5.1 mmol/L 4.4   Chloride 98 - 111 mmol/L 108   CO2 22 - 32 mmol/L 24   Calcium 8.9 - 10.3 mg/dL 8.4   Total Protein 6.5 - 8.1 g/dL 8.0   Total Bilirubin 0.3 - 1.2 mg/dL 0.9   Alkaline Phos 38 - 126 U/L 875   AST 15 - 41 U/L 83   ALT 0 - 44 U/L 78       Latest Ref Rng & Units 10/02/2021    9:02 AM  CBC  WBC 4.0 - 10.5 K/uL 7.7   Hemoglobin 13.0 - 17.0 g/dL 11.5   Hematocrit 39.0 - 52.0 % 34.4   Platelets 150 - 400 K/uL 195      Assessment and plan- Patient is a 66 y.o. male with metastatic hepatocellular carcinoma on cabozantinib here for routine follow-up  AFP has gone up significantly from 200s to 1002 weeks ago.  Scans in June 2023 showed  progression.  Initially we had planned to see if maybe a more consistent use of cabozantinib will allow for some response.  However despite  taking the drug AFP continues to rise.  His performance status is declining.  We discussed stopping active treatment for his liver cancer at this time and proceeding with best supportive care/home hospice.  Patient understands that his overall prognosis is poor likely 3 to 6 months and he is willing to proceed with hospice at this time.   Visit Diagnosis 1. Goals of care, counseling/discussion   2. Cancer, hepatocellular ([place])      Dr. Randa Evens, MD, MPH [facility] [phone] 10/02/2021 12:48 PM

## 2021-10-09 ENCOUNTER — Other Ambulatory Visit: Payer: Self-pay | Admitting: Oncology

## 2021-10-16 ENCOUNTER — Ambulatory Visit: Payer: Medicare Other | Admitting: Occupational Therapy

## 2021-10-18 ENCOUNTER — Other Ambulatory Visit (HOSPITAL_COMMUNITY): Payer: Self-pay

## 2021-10-21 ENCOUNTER — Telehealth: Payer: Medicare Other | Admitting: Hospice and Palliative Medicine

## 2021-10-28 ENCOUNTER — Encounter: Payer: Self-pay | Admitting: Hospice and Palliative Medicine

## 2021-10-30 ENCOUNTER — Other Ambulatory Visit: Payer: Self-pay | Admitting: Hospice and Palliative Medicine

## 2021-10-30 ENCOUNTER — Telehealth: Payer: Self-pay | Admitting: *Deleted

## 2021-10-30 ENCOUNTER — Encounter: Payer: Self-pay | Admitting: *Deleted

## 2021-10-30 DIAGNOSIS — C220 Liver cell carcinoma: Secondary | ICD-10-CM

## 2021-10-30 MED ORDER — QUETIAPINE FUMARATE 25 MG PO TABS: 25.0000 mg | ORAL_TABLET | Freq: Every evening | ORAL | 2 refills | Status: AC | PRN

## 2021-10-30 MED ORDER — OXYCODONE HCL 5 MG PO TABS: 5.0000 mg | ORAL_TABLET | ORAL | 0 refills | Status: AC | PRN

## 2021-10-30 NOTE — Telephone Encounter (Signed)
Your information has been submitted to [employer] [place]. [employer] [place] will review the request and notify you of the determination decision directly, typically within 3 business days of your submission and once all necessary information is received.  You will also receive your request decision electronically. To check for an update later, open the request again from your dashboard.  If [employer] [place] has not responded within the specified timeframe or if you have any questions about your PA submission, contact [employer] [place] directly at (MAPD) [phone] or (PDP) [phone]. 

## 2021-10-30 NOTE — Telephone Encounter (Signed)
Pa submitted for:  KESSLER SOLLY Key: [id number] - Rx #: [id number]  QUEtiapine Fumarate '25MG'$  tablets  Sherre Poot [place] Medicare Part D Electronic Request Form (CB) Original Claim Info [phone]

## 2021-10-30 NOTE — Telephone Encounter (Signed)
Voicemail left for Maxwell Edwards yesterday. I spoke with Maxwell Edwards by phone today.   Patient is declining and has had recent falls. Appetite is poor and patient would like to stop non necessary medications. He has some days that he requires increased oxycodone dosing and we discussed liberalizing frequency to Q4H PRN. They have sufficient quantity of oxycodone for now and do not require a refill.  Patient has occasional visual hallucinations. He is having difficulty sleeping. Will start Seroquel prn at bedtime.   Patient was previously referred to hospice but apparently services were not started as he did not think he was ready for that. Again discussed benefit of hospice and family would like to proceed with referral. Will send a new order.

## 2021-10-30 NOTE — Telephone Encounter (Signed)
-----   Message from Secundino Ginger sent at 10/30/2021 10:23 AM EDT ----- Regarding: [place] for Quetiapine Fumarate sent to his chart.

## 2021-10-30 NOTE — Telephone Encounter (Signed)
[name] (KeyJack Quarto) [phone] QUEtiapine Fumarate '25MG'$  tablets Status: PA Response - ApprovedCreated: August 23rd, 2023 [phone]: August 23rd, 2023

## 2021-11-06 ENCOUNTER — Other Ambulatory Visit: Payer: Self-pay | Admitting: Oncology

## 2021-11-07 ENCOUNTER — Telehealth: Payer: Self-pay | Admitting: Hospice and Palliative Medicine

## 2021-11-07 NOTE — Telephone Encounter (Signed)
Received a call from hospice nurse, Hassell Done.  Pain is poorly controlled with intermittent use of oxycodone.  Agreed to increase oxycodone dose to 1 to 2 tablets every 4 hours as needed.

## 2021-12-08 DEATH — deceased

## 2022-12-25 IMAGING — MR MR ABDOMEN WO/W CM
18 series · 48 of 48 positions shown · IV contrast (7ml Gadavist)
Comparison: 04/06/2020

CLINICAL DATA: Liver mass is seen on chest CT, for further
characterization. Cirrhosis.

EXAM:
MRI ABDOMEN WITHOUT AND WITH CONTRAST
TECHNIQUE: Multiplanar multisequence MR imaging of the abdomen was performed
both before and after the administration of intravenous contrast.
CONTRAST:  7mL GADAVIST GADOBUTROL 1 MMOL/ML IV SOLN

[Series 3: T2 · coronal · 6.5mm · 1.19mm/px · 1 of 31 slices shown (1 of 2)]
[im 1/31]
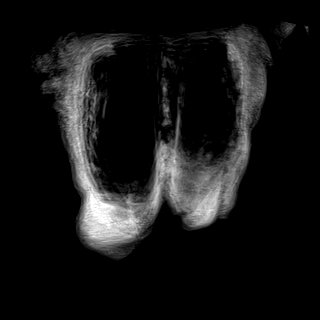

[Series 4: T2 · axial · 6.5mm · 1.19mm/px · 1 of 35 slices shown (2 of 2)]
[im 1/35]
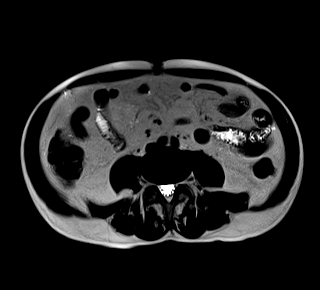

[Series 6: T2 fat-sat · axial · 6.5mm · 1.19mm/px · 1 of 34 slices shown]
[im 1/34]
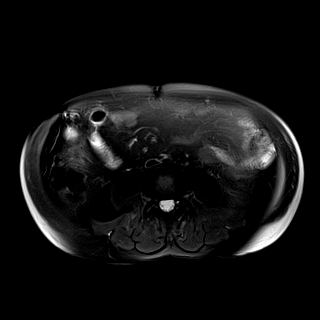

[Series 7: ax dwi_tracew · axial · 6.5mm · 1.42mm/px · z∈[-106,+151]mm · 3 of 102 slices shown]
[im 1/102]
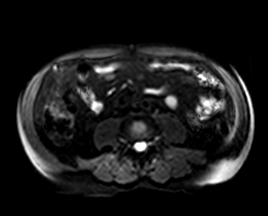
[im 51/102]
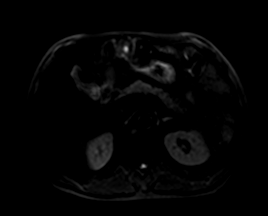
[im 102/102]
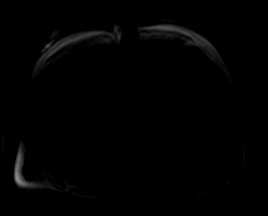

[Series 8: ax dwi_adc · axial · 6.5mm · 1.42mm/px · 1 of 34 slices shown]
[im 1/34]
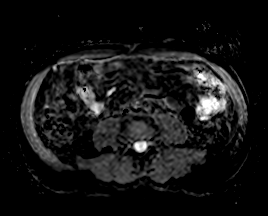

[Series 9: T1 · axial · 6.5mm · 0.74mm/px · 1 of 35 slices shown]
[im 1/35]
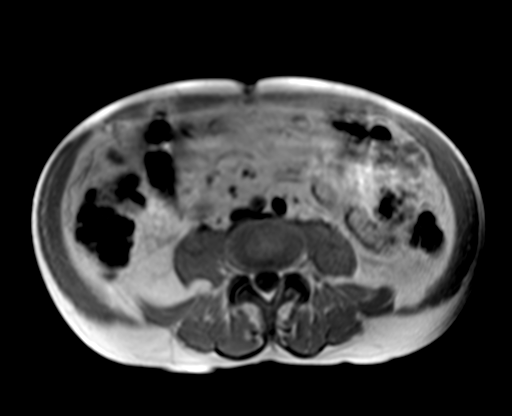

[Series 10: bSSFP · axial · 6.5mm · 0.74mm/px · 1 of 35 slices shown]
[im 1/35]
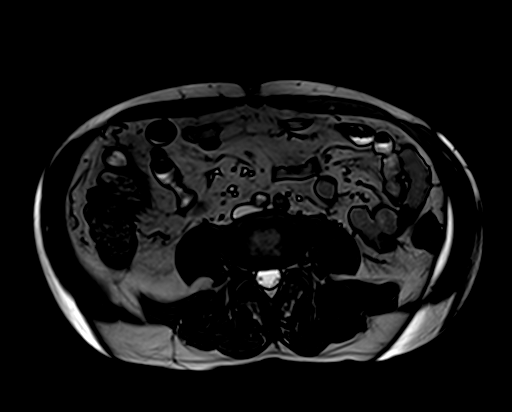

[Series 11: T1 dynamic fat-sat · axial · non-contrast · 3.0mm · 1.19mm/px · z∈[-108,+153]mm · 3 of 88 slices shown (1 of 5)]
[im 1/88]
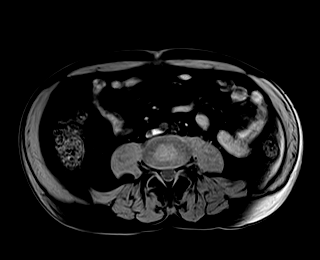
[im 44/88]
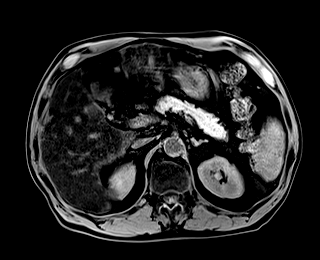
[im 88/88]
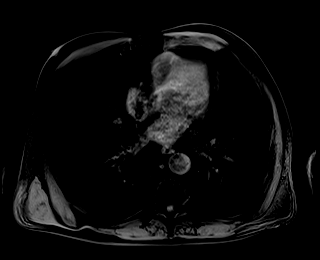

[Series 12: T1 dynamic fat-sat post-contrast · axial · 3.0mm · 1.19mm/px · z∈[-108,+153]mm · 4 of 88 slices shown (1 of 4)]
[im 1/88]
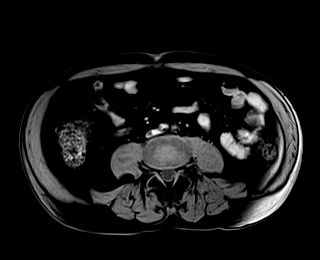
[im 30/88]
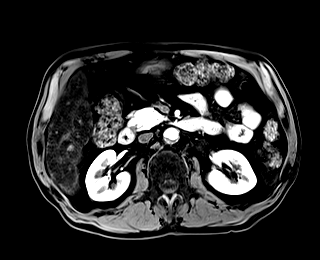
[im 59/88]
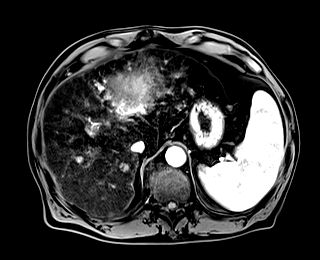
[im 88/88]
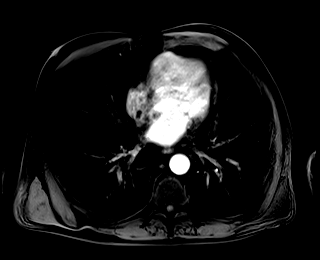

[Series 13: T1 dynamic fat-sat · axial · 3.0mm · 1.19mm/px · z∈[-108,+153]mm · 4 of 88 slices shown (2 of 5)]
[im 1/88]
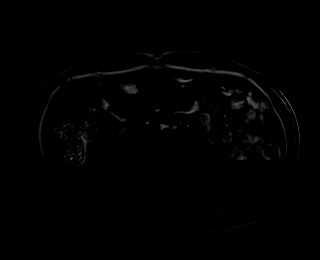
[im 30/88]
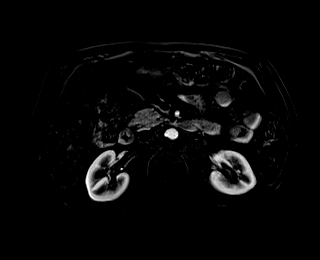
[im 59/88]
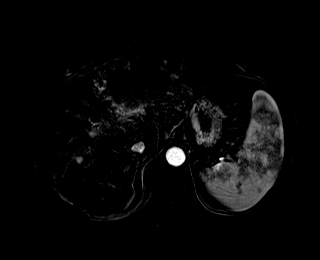
[im 88/88]
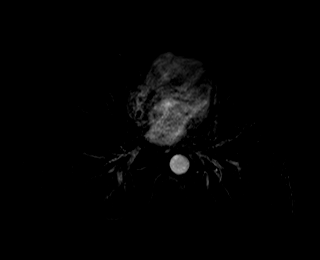

[Series 14: T1 dynamic fat-sat post-contrast · axial · 3.0mm · 1.19mm/px · z∈[-108,+153]mm · 4 of 88 slices shown (2 of 4)]
[im 1/88]
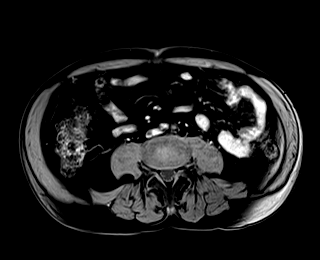
[im 30/88]
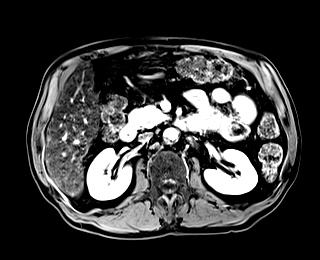
[im 59/88]
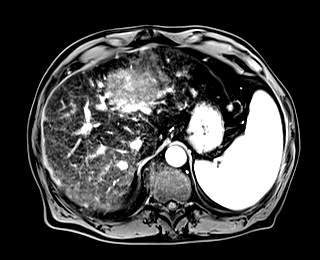
[im 88/88]
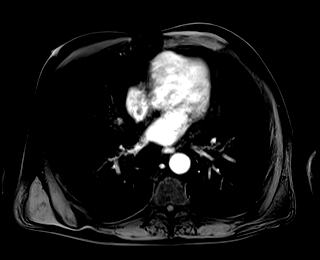

[Series 15: T1 dynamic fat-sat · axial · 3.0mm · 1.19mm/px · z∈[-108,+153]mm · 4 of 88 slices shown (3 of 5)]
[im 1/88]
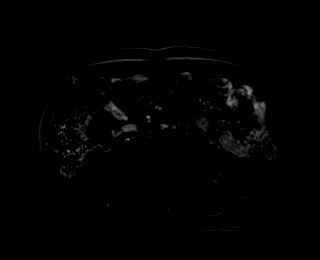
[im 30/88]
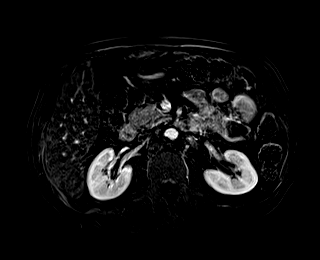
[im 59/88]
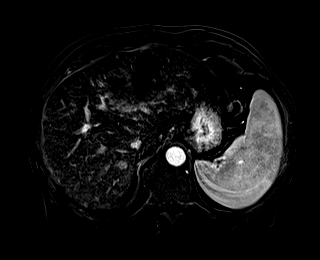
[im 88/88]
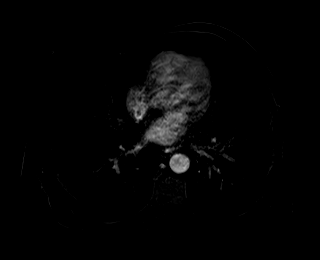

[Series 16: T1 dynamic fat-sat post-contrast · axial · 3.0mm · 1.19mm/px · z∈[-108,+153]mm · 4 of 88 slices shown (3 of 4)]
[im 1/88]
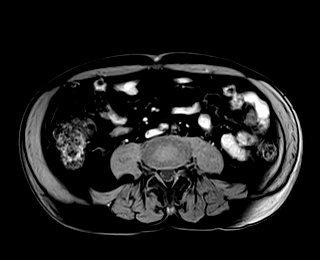
[im 30/88]
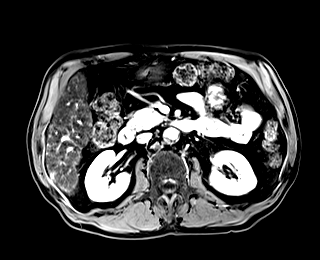
[im 59/88]
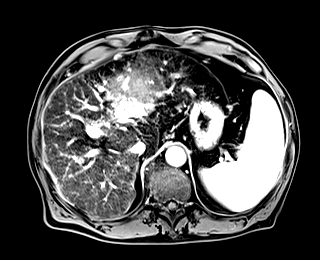
[im 88/88]
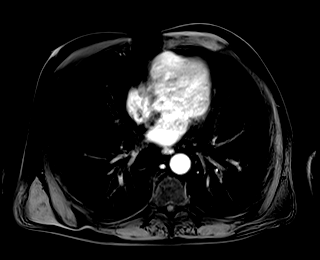

[Series 17: T1 dynamic fat-sat · axial · 3.0mm · 1.19mm/px · z∈[-108,+153]mm · 4 of 88 slices shown (4 of 5)]
[im 1/88]
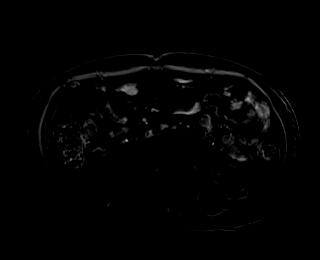
[im 30/88]
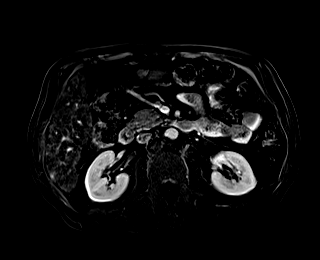
[im 59/88]
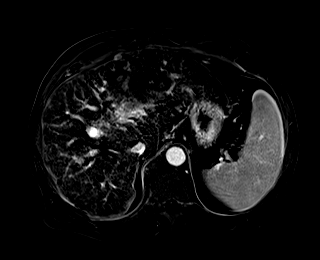
[im 88/88]
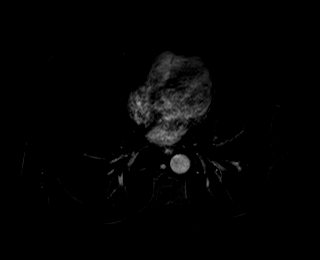

[Series 18: T1 dynamic post-contrast · coronal · 3.0mm · 1.31mm/px · 3 of 72 slices shown]
[im 1/72]
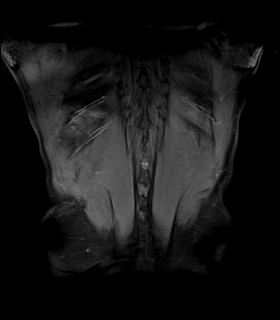
[im 36/72]
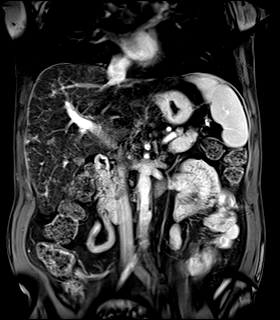
[im 72/72]
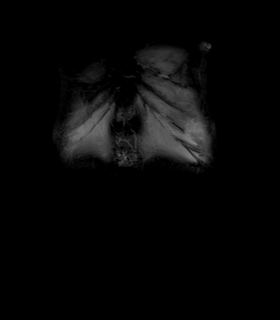

[Series 19: T1 dynamic fat-sat post-contrast · axial · 3.0mm · 1.19mm/px · z∈[-108,+153]mm · 4 of 88 slices shown (4 of 4)]
[im 1/88]
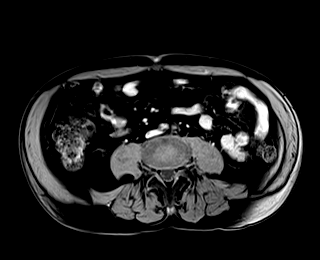
[im 30/88]
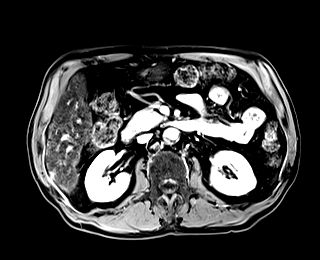
[im 59/88]
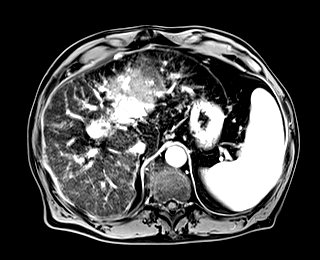
[im 88/88]
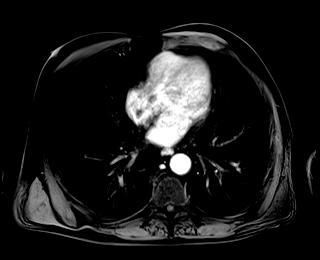

[Series 20: T1 dynamic fat-sat · axial · 3.0mm · 1.19mm/px · z∈[-108,+153]mm · 4 of 88 slices shown (5 of 5)]
[im 1/88]
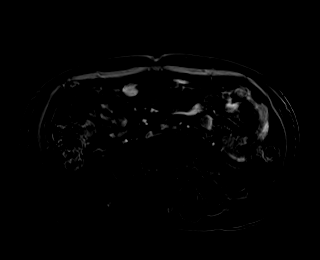
[im 30/88]
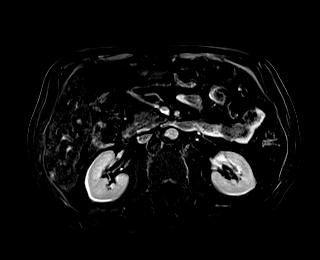
[im 59/88]
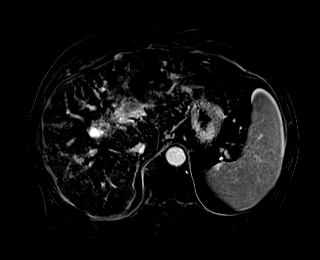
[im 88/88]
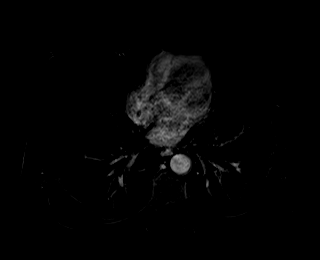

[Series 1011: out of phase · axial · 6.5mm · 0.74mm/px · 1 of 35 slices shown]
[im 1/35]
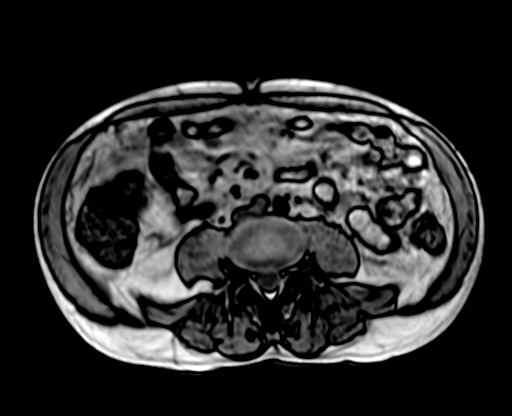

[48 of 48 positions shown; findings below may reference images not displayed]

FINDINGS: Lower chest: Median sternotomy.

Hepatobiliary: Background hemochromatosis involving the liver with
low T1 and T2 signal and signal dropout on in-phase imaging.

Nodular hepatic contour compatible cirrhosis.

Infiltrating 7.9 by 6.9 by 6.9 cm mass in the lateral segment left
hepatic lobe with associated extensor of enhancing tumor thrombus in
the left portal vein as well as numerous additional multifocal
enhancing tumors throughout the left hepatic lobe and some in the
right hepatic lobe.

Another mass in segment 4a of the liver measures 3.4 by 2.9 cm on
image 4 of series 6.

These masses demonstrate mostly low-grade enhancement including
low-grade arterial phase enhancement. Many of these continue to
demonstrate delayed enhancement without a characteristic washout or
capsule appearance.

Pancreas:  Unremarkable

Spleen: The spleen measures 13.8 by 6.6 by 8.8 cm (volume = 420
cm^3).

Adrenals/Urinary Tract:  Unremarkable

Stomach/Bowel: Unremarkable

Vascular/Lymphatic: Aortoiliac atherosclerotic vascular disease.
Small porta hepatis lymph nodes are not pathologically enlarged.

Other:  No supplemental non-categorized findings.

Musculoskeletal: Unremarkable
IMPRESSION: 1. Infiltrating 7.9 by 6.9 by 6.9 cm mass in the lateral segment
left hepatic lobe with associated enhancing tumor thrombus filling
the left portal vein as well as numerous additional multifocal
enhancing tumors throughout the left hepatic lobe and some in the
right hepatic lobe. Although otherwise suspicious for multifocal
hepatocellular carcinoma, the enhancement pattern is not otherwise
specific, with only low-grade arterial phase enhancement and
persistent delayed phase enhancement which would be somewhat
atypical for HCC. Accordingly, the possibility of cholangiocarcinoma
or metastatic disease cannot be readily excluded and tissue
diagnosis might be appropriate. Consider multidisciplinary cancer
conference referral for further discussion.
2. Background hemochromatosis involving the liver. Borderline
splenomegaly.
3.  Aortic Atherosclerosis ([id number]).
